# Patient Record
Sex: Male | Born: 1955 | Race: Black or African American | Hispanic: No | Marital: Married | Smoking: Never smoker
Health system: Southern US, Community
[De-identification: ages and names within clinical notes are randomized; demographics above are authoritative.]

## PROBLEM LIST (undated history)

## (undated) DIAGNOSIS — I739 Peripheral vascular disease, unspecified: Secondary | ICD-10-CM

## (undated) DIAGNOSIS — I219 Acute myocardial infarction, unspecified: Secondary | ICD-10-CM

## (undated) DIAGNOSIS — I639 Cerebral infarction, unspecified: Secondary | ICD-10-CM

## (undated) DIAGNOSIS — I10 Essential (primary) hypertension: Secondary | ICD-10-CM

## (undated) DIAGNOSIS — I4891 Unspecified atrial fibrillation: Secondary | ICD-10-CM

## (undated) DIAGNOSIS — I2510 Atherosclerotic heart disease of native coronary artery without angina pectoris: Secondary | ICD-10-CM

## (undated) DIAGNOSIS — I82409 Acute embolism and thrombosis of unspecified deep veins of unspecified lower extremity: Secondary | ICD-10-CM

## (undated) HISTORY — DX: Unspecified atrial fibrillation: I48.91

## (undated) HISTORY — DX: Peripheral vascular disease, unspecified: I73.9

## (undated) HISTORY — PX: CARDIAC DEFIBRILLATOR PLACEMENT: SHX171

## (undated) HISTORY — PX: PR VEIN BYPASS GRAFT,AORTO-FEM-POP: 35551

## (undated) HISTORY — DX: Acute embolism and thrombosis of unspecified deep veins of unspecified lower extremity: I82.409

## (undated) HISTORY — DX: Acute myocardial infarction, unspecified: I21.9

## (undated) HISTORY — DX: Essential (primary) hypertension: I10

## (undated) HISTORY — DX: Atherosclerotic heart disease of native coronary artery without angina pectoris: I25.10

## (undated) HISTORY — DX: Cerebral infarction, unspecified: I63.9

---

## 2002-11-08 ENCOUNTER — Ambulatory Visit (HOSPITAL_COMMUNITY): Admission: RE | Admit: 2002-11-08 | Discharge: 2002-11-08 | Payer: Self-pay | Admitting: Cardiovascular Disease

## 2003-10-24 HISTORY — PX: CORONARY ARTERY BYPASS GRAFT: SHX141

## 2014-05-03 ENCOUNTER — Ambulatory Visit: Payer: Self-pay | Admitting: Podiatry

## 2014-05-10 ENCOUNTER — Encounter: Payer: Self-pay | Admitting: Podiatry

## 2014-05-10 ENCOUNTER — Ambulatory Visit (INDEPENDENT_AMBULATORY_CARE_PROVIDER_SITE_OTHER): Payer: BC Managed Care – PPO | Admitting: Podiatry

## 2014-05-10 ENCOUNTER — Telehealth: Payer: Self-pay | Admitting: *Deleted

## 2014-05-10 ENCOUNTER — Ambulatory Visit: Payer: Self-pay | Admitting: Podiatry

## 2014-05-10 VITALS — BP 116/85 | HR 81 | Ht 74.0 in | Wt 266.0 lb

## 2014-05-10 DIAGNOSIS — B351 Tinea unguium: Secondary | ICD-10-CM | POA: Insufficient documentation

## 2014-05-10 DIAGNOSIS — M79609 Pain in unspecified limb: Secondary | ICD-10-CM

## 2014-05-10 DIAGNOSIS — M79605 Pain in left leg: Secondary | ICD-10-CM

## 2014-05-10 DIAGNOSIS — I739 Peripheral vascular disease, unspecified: Secondary | ICD-10-CM

## 2014-05-10 DIAGNOSIS — L600 Ingrowing nail: Secondary | ICD-10-CM | POA: Insufficient documentation

## 2014-05-10 DIAGNOSIS — M79675 Pain in left toe(s): Secondary | ICD-10-CM

## 2014-05-10 DIAGNOSIS — M86179 Other acute osteomyelitis, unspecified ankle and foot: Secondary | ICD-10-CM

## 2014-05-10 HISTORY — DX: Pain in left leg: M79.605

## 2014-05-10 HISTORY — DX: Tinea unguium: B35.1

## 2014-05-10 MED ORDER — CEPHALEXIN 500 MG PO CAPS: 500.0000 mg | ORAL_CAPSULE | Freq: Four times a day (QID) | ORAL | Status: DC

## 2014-05-10 MED ORDER — AMOXICILLIN-POT CLAVULANATE 500-125 MG PO TABS: 1.0000 | ORAL_TABLET | Freq: Three times a day (TID) | ORAL | Status: DC

## 2014-05-10 NOTE — Progress Notes (Signed)
Subjective: 58 year old male presents with problematic toe 2nd left. Stated that he has had injury on the 2nd toe left foot about a year ago and the toe continues to give problem since then, bleeding off and on.   Objective: Vascular: Pedal pulses are not palpable on both feet. Dermatologic: Thick dystrophic nails x 10. Bleeding loose nail 2nd and 3rd left. Positive inflammation on 2nd and 3rd digits with proximal expansion to the level of MPJ left foot. Mild forefoot edema with increased warmth left foot. Orthopedic: Severe contracted lesser digits bilateral. Neurologic: All epicritic and tactile sensations grossly intact. Upon removal of toe nails after numbing and cleansing with Iodine, noted of exposed distal phalangeal bone through torn tissue at the medial nail border which had been bleeding through nail base.  Radiographic examination done. Noted of loss of bone mass at distal end of the 2nd digit left.  Assessment: Dystrophic mycotic nails x 10. Injured loose nail with inflammation and bleeding on 2nd and 3rd left without abscess or sign of bacterial infection.  Osteomyelitis distal phalanx 2nd left. R/O PVD.  Plan: Reviewed findings and available treatment options. Left foot 2nd and 3rd digit anesthetized with 75ml of 50/50 mixture 0.5% Marcaine with epinephrine and 1% Xylocaine plain. Left bleeding toes cleansed with Iodine and nail plate removed. Reviewed the findings regard to bone infection. Home care instruction given to cleanse with Iodine, cover the area with clean dressing daily.  Amerigel dressing applied with instruction for dressing change.  All other nails also debrided. Referral to Vascular consultation initiated. Return in one week.

## 2014-05-10 NOTE — Patient Instructions (Signed)
Seen for painful toes 2nd and 3rd left. X-rays taken and noted of damaged bone at top of the 2nd digit.  Upon removal of nail plate, also noted of exposed bone at the tip of 2nd digit left. This findings suggest possible bone infection which may take long period of healing or may require removal of infected bone if fail to heal. Follow instruction, clean the wound with Iodine solution and cover with clean bandage. Return in one week.

## 2014-05-10 NOTE — Telephone Encounter (Signed)
Dr.Sheard, Pt called @ lunch and he states the RX sent in is expensive, the generic is 45.00. Is there anything else you can prescribe? I told him probably it could be a stronger dose because of infection in his foot, he still wanted me to ask you about a generic that was cheaper???

## 2014-05-13 ENCOUNTER — Other Ambulatory Visit: Payer: Self-pay | Admitting: *Deleted

## 2014-05-13 DIAGNOSIS — M79609 Pain in unspecified limb: Secondary | ICD-10-CM

## 2014-05-17 ENCOUNTER — Ambulatory Visit (INDEPENDENT_AMBULATORY_CARE_PROVIDER_SITE_OTHER): Payer: BC Managed Care – PPO | Admitting: Podiatry

## 2014-05-17 ENCOUNTER — Encounter: Payer: Self-pay | Admitting: Podiatry

## 2014-05-17 DIAGNOSIS — M86179 Other acute osteomyelitis, unspecified ankle and foot: Secondary | ICD-10-CM

## 2014-05-17 HISTORY — DX: Other acute osteomyelitis, unspecified ankle and foot: M86.179

## 2014-05-17 NOTE — Patient Instructions (Signed)
Seen for infected 2nd toe left foot.  Wound has necrotic base. Finish antibiotics. Cleanse wound with Betadine and apply Silvastat that was dispensed.  Return in 2 weeks.

## 2014-05-17 NOTE — Progress Notes (Signed)
Subjective: Status post infected nail avulsion with exposed phalangeal bone distal medial phalanx 2nd digit left.  Patient came in wearing surgical shoe and 2nd and 3rd toes covered with dressing.  Stated that he is scheduled to see a vascular doctor in June 24th.  Has had nail removal and placed in antibiotics last week.  Taking antibiotics as prescribed.   Objective: 3rd digit left is dry and all healed.  2nd digit left foot has raw nail base with necrotic tissue at distal medal top of nail bed area. Positive of generalized pedal edema left foot.   Assessment: Healed off 3rd digit left. Infected 2nd digit with necrotic tissue over exposed phalangeal bone.  Plan: Reviewed the findings. Area cleansed with Betadine and H2O2. Silvastat applied. Silvastat x 1 tube plus dressing change supply dispensed for daily wound care.  Patient is to wear open toed surgical shoes as much as possible.  Return in 2 weeks.

## 2014-05-31 ENCOUNTER — Ambulatory Visit (INDEPENDENT_AMBULATORY_CARE_PROVIDER_SITE_OTHER): Payer: BC Managed Care – PPO | Admitting: Podiatry

## 2014-05-31 ENCOUNTER — Encounter: Payer: Self-pay | Admitting: Podiatry

## 2014-05-31 VITALS — BP 117/82 | HR 85

## 2014-05-31 DIAGNOSIS — M79675 Pain in left toe(s): Secondary | ICD-10-CM

## 2014-05-31 DIAGNOSIS — M86179 Other acute osteomyelitis, unspecified ankle and foot: Secondary | ICD-10-CM

## 2014-05-31 DIAGNOSIS — M79605 Pain in left leg: Secondary | ICD-10-CM

## 2014-05-31 DIAGNOSIS — M79609 Pain in unspecified limb: Secondary | ICD-10-CM

## 2014-05-31 MED ORDER — CEPHALEXIN 500 MG PO CAPS: 500.0000 mg | ORAL_CAPSULE | Freq: Four times a day (QID) | ORAL | Status: DC

## 2014-05-31 NOTE — Progress Notes (Signed)
Subjective: Continue with home care, cleansing with peroxide and iodine, dressed with Silverstat cream daily. 3 weeks since infected nail was taken out.   Objective: Enlarged 2nd digit with small narrow opening from previous open ulcer at distal medial aspect. Minimum drainage noted.  No redness or edema associated with the wound.   Assessment: Improving ulcer 2nd toe left from impacted ingrown nail.    Plan: Reviewed findings and possible complication, loss of toe or foot in spite of antibiotic treatment and daily wound care. Keflex re-ordered. He still cannot afford to pay for Augmentin co-pay.  Continue with daily dressing change and stay in Surgical shoes.  Will watch a few more weeks for wound progress before jump into surgical removal of distal phalanx.  Return in 2 weeks.

## 2014-05-31 NOTE — Patient Instructions (Signed)
Follow up on left 2nd toe infection.  Seen less swelling and less edema, the wound is clean showing improvement. Antibiotic re-ordered. Return in 2 weeks.

## 2014-06-14 ENCOUNTER — Encounter: Payer: Self-pay | Admitting: Vascular Surgery

## 2014-06-15 ENCOUNTER — Other Ambulatory Visit: Payer: Self-pay | Admitting: Vascular Surgery

## 2014-06-15 ENCOUNTER — Ambulatory Visit (INDEPENDENT_AMBULATORY_CARE_PROVIDER_SITE_OTHER)
Admission: RE | Admit: 2014-06-15 | Discharge: 2014-06-15 | Disposition: A | Discharge status: Home / Self Care | Payer: BC Managed Care – PPO | Source: Ambulatory Visit | Attending: Vascular Surgery | Admitting: Vascular Surgery

## 2014-06-15 ENCOUNTER — Ambulatory Visit (HOSPITAL_COMMUNITY)
Admission: RE | Admit: 2014-06-15 | Discharge: 2014-06-15 | Disposition: A | Discharge status: Home / Self Care | Payer: BC Managed Care – PPO | Source: Ambulatory Visit | Attending: Vascular Surgery | Admitting: Vascular Surgery

## 2014-06-15 ENCOUNTER — Encounter: Payer: Self-pay | Admitting: Vascular Surgery

## 2014-06-15 ENCOUNTER — Ambulatory Visit: Payer: BC Managed Care – PPO | Admitting: Podiatry

## 2014-06-15 ENCOUNTER — Ambulatory Visit (INDEPENDENT_AMBULATORY_CARE_PROVIDER_SITE_OTHER): Payer: BC Managed Care – PPO | Admitting: Vascular Surgery

## 2014-06-15 VITALS — BP 117/81 | HR 65 | Ht 74.0 in | Wt 266.0 lb

## 2014-06-15 DIAGNOSIS — L98499 Non-pressure chronic ulcer of skin of other sites with unspecified severity: Principal | ICD-10-CM

## 2014-06-15 DIAGNOSIS — I739 Peripheral vascular disease, unspecified: Secondary | ICD-10-CM

## 2014-06-15 DIAGNOSIS — M79609 Pain in unspecified limb: Secondary | ICD-10-CM

## 2014-06-15 DIAGNOSIS — I7025 Atherosclerosis of native arteries of other extremities with ulceration: Secondary | ICD-10-CM

## 2014-06-15 HISTORY — DX: Atherosclerosis of native arteries of other extremities with ulceration: I70.25

## 2014-06-15 NOTE — Assessment & Plan Note (Signed)
This patient has a nonhealing wound on his left second toe. X-ray shows osteomyelitis. He will require a left second toe amputation. However based on his exam and Doppler studies he has evidence of significant multilevel arterial occlusive disease. Think without revascularization, given the toe pressure of 49 mmHg, there would be significant risk of a toe amputation nonhealing. Clearly this is a limb threatening problem. I have recommended that we proceed with arteriography. I have reviewed with the patient the indications for arteriography. In addition, I have reviewed the potential complications of arteriography including but not limited to: Bleeding, arterial injury, arterial thrombosis, dye action, renal insufficiency, or other unpredictable medical problems. I have explained to the patient that if we find disease amenable to angioplasty we could potentially address this at the same time. I have discussed the potential complications of angioplasty and stenting, including but not limited to: Bleeding, arterial thrombosis, arterial injury, dissection, or the need for surgical intervention. Currently, the patient would like to consider this further before scheduling the procedure. I've explained that without revascularization I think that the toe will not heal and that this could clearly become a limb threatening problem. Hopefully he will call as soon to schedule his arteriogram and possible angioplasty and stenting if he has disease amenable to percutaneous intervention

## 2014-06-15 NOTE — Progress Notes (Addendum)
Patient ID: Connor Mina., male   DOB: 1956-04-22, 58 y.o.   MRN: [id number]  Reason for Consult: Nonhealing wound left second toe   Referred by Dr. Raynald Kemp  Subjective:     HPI:  Connor Haagen. is a 58 y.o. male Who states that he has had a wound on his left second toe for a year now. However, only recently did the toe become significantly worse. I have reviewed his records from [facility] foot center. He had an injury to the left second toe about a year ago and it has given him problems ever . He was felt to have osteomyelitis of the distal phalanx of the left second toe. This was based on an x-ray performed there. He sent for vascular consultation.  The patient denies any significant claudication or history of rest pain.  His risk factors for vascular disease include hypertension and a family history of premature cardiovascular disease. He denies any history of diabetes, hypercholesterolemia, or history of tobacco use.  His mother had heart disease in her 74s. He's also had a previous myocardial infarction in 2003 and has undergone CABG with vein taken from the right leg. He is on Xarelto for atrial fibrillation  Past Medical History  Diagnosis Date  . Atrial fibrillation   . Stroke   . CAD (coronary artery disease)   . DVT (deep venous thrombosis)   . Myocardial infarction   . Peripheral vascular disease   . Hypertension    Family History  Problem Relation Age of Onset  . Heart disease Mother     before age 31  . Hyperlipidemia Mother   . Hypertension Mother   . Heart attack Mother   . Hyperlipidemia Father   . Hypertension Father    Past Surgical History  Procedure Laterality Date  . Pr vein bypass graft,aorto-fem-pop    . Coronary artery bypass graft  10/2003  . Cardiac defibrillator placement     Short Social History:  History  Substance Use Topics  . Smoking status: Never Smoker   . Smokeless tobacco: Never Used  . Alcohol Use: No   Allergies  Allergen  Reactions  . Plavix [Clopidogrel Bisulfate]    Current Outpatient Prescriptions  Medication Sig Dispense Refill  . aspirin 81 MG tablet Take 81 mg by mouth daily.      . cephALEXin (KEFLEX) 500 MG capsule Take 1 capsule (500 mg total) by mouth 4 (four) times daily.  60 capsule  0  . lisinopril (PRINIVIL,ZESTRIL) 10 MG tablet Take 10 mg by mouth daily.      . rivaroxaban (XARELTO) 10 MG TABS tablet Take 10 mg by mouth daily.      . sotalol (BETAPACE) 80 MG tablet Take 80 mg by mouth daily.       No current facility-administered medications for this visit.   Review of Systems  Constitutional: Negative for chills and fever.  Eyes: Negative for loss of vision.  Respiratory: Negative for cough and wheezing.  Cardiovascular: Positive for leg swelling. Negative for chest pain, chest tightness, claudication, dyspnea with exertion, orthopnea and palpitations.  GI: Negative for blood in stool and vomiting.  GU: Negative for dysuria and hematuria.  Musculoskeletal: Negative for leg pain, joint pain and myalgias.  Skin: Negative for rash and wound.  Neurological: Positive for numbness. Negative for dizziness and speech difficulty.  Hematologic: Negative for bruises/bleeds easily. Psychiatric: Negative for depressed mood.        Objective:  Objective  Filed Vitals:  06/15/14 1440  BP: 117/81  Pulse: 65  Height: 6\' 2"  (1.88 m)  Weight: 266 lb (120.657 kg)  SpO2: 100%   Body mass index is 34.14 kg/(m^2).  Physical Exam  Constitutional: He is oriented to person, place, and time. He appears well-developed and well-nourished.  HENT:  Head: Normocephalic and atraumatic.  Neck: Neck supple. No JVD present. No thyromegaly present.  Cardiovascular: Normal rate, regular rhythm and normal heart sounds.  Exam reveals no friction rub.   No murmur heard. Pulses:      Femoral pulses are 2+ on the right side, and 1+ on the left side. Pulmonary/Chest: Breath sounds normal. He has no wheezes. He  has no rales.  Abdominal: Soft. Bowel sounds are normal. There is no tenderness.  I do not palpate an aneurysm.  Musculoskeletal: Normal range of motion. He exhibits no edema.  Lymphadenopathy:    He has no cervical adenopathy.  Neurological: He is alert and oriented to person, place, and time. He has normal strength. No sensory deficit.  Skin: No lesion and no rash noted.  He has an open wound on the end of his left second toe with a small amount of drainage. The left second toe is swollen. There is mild erythema.  Psychiatric: He has a normal mood and affect.   Data: I have independently interpreted his duplex scan which shows that he has common femoral artery disease bilaterally and also bilateral superficial femoral artery occlusions. He has monophasic Doppler signals in the dorsalis pedis and posterior tibial positions bilaterally. ABI on the right is 80%. ABI on the left is 85%. Toe pressure on the right is 55 mmHg. Toe pressure on the left is 49 mmHg.      Assessment/Plan:    Atherosclerosis of native arteries of the extremities with ulceration(440.23) This patient has a nonhealing wound on his left second toe. X-ray shows osteomyelitis. He will require a left second toe amputation. However based on his exam and Doppler studies he has evidence of significant multilevel arterial occlusive disease. Think without revascularization, given the toe pressure of 49 mmHg, there would be significant risk of a toe amputation nonhealing. Clearly this is a limb threatening problem. I have recommended that we proceed with arteriography. I have reviewed with the patient the indications for arteriography. In addition, I have reviewed the potential complications of arteriography including but not limited to: Bleeding, arterial injury, arterial thrombosis, dye action, renal insufficiency, or other unpredictable medical problems. I have explained to the patient that if we find disease amenable to angioplasty we  could potentially address this at the same time. I have discussed the potential complications of angioplasty and stenting, including but not limited to: Bleeding, arterial thrombosis, arterial injury, dissection, or the need for surgical intervention. Currently, the patient would like to consider this further before scheduling the procedure. I've explained that without revascularization I think that the toe will not heal and that this could clearly become a limb threatening problem. Hopefully he will call as soon to schedule his arteriogram and possible angioplasty and stenting if he has disease amenable to percutaneous intervention   Of note, if he does agree to proceed with arteriography we will need to stop his Xarelto for 48 hours prior to his procedure.  Chuck Hinthristopher S Dickson MD [facility]

## 2014-06-27 ENCOUNTER — Encounter: Payer: Self-pay | Admitting: Podiatry

## 2014-06-27 ENCOUNTER — Ambulatory Visit (INDEPENDENT_AMBULATORY_CARE_PROVIDER_SITE_OTHER): Payer: BC Managed Care – PPO | Admitting: Podiatry

## 2014-06-27 VITALS — BP 138/77 | HR 65 | Ht 74.0 in | Wt 266.0 lb

## 2014-06-27 DIAGNOSIS — L98499 Non-pressure chronic ulcer of skin of other sites with unspecified severity: Secondary | ICD-10-CM

## 2014-06-27 DIAGNOSIS — M86179 Other acute osteomyelitis, unspecified ankle and foot: Secondary | ICD-10-CM

## 2014-06-27 DIAGNOSIS — I739 Peripheral vascular disease, unspecified: Secondary | ICD-10-CM

## 2014-06-27 NOTE — Patient Instructions (Addendum)
Seen for 2nd toe lesion left foot.  Reviewed Vascular study findings regard to need for further test.  May continue daily dressing change. Use open toed shoes till the wound is healing.

## 2014-06-27 NOTE — Progress Notes (Signed)
Subjective: Follow up on 2nd toe wound left foot.  Wound healing in progress. Blood sugar is under control. Vascular consultation report reviewed with the patient.  Patient elected to pursue recommendation given by his Cardiologist who told him that walking is the best thing for him.  Patient believes that he has good enough blood flow based on the nurse's comment who done the test.   Objective: Open wound 2nd digit left. Still draining through bandage and to socks. Pedal pulses are not palpable.   Assessment: Possible Osteomyelitis 2nd toe left. Open wound, healing still in progress.   Plan: Continue with current level of care. Use open toed shoes.

## 2014-07-20 ENCOUNTER — Ambulatory Visit: Payer: BC Managed Care – PPO | Admitting: Podiatry

## 2014-07-29 ENCOUNTER — Ambulatory Visit: Payer: BC Managed Care – PPO | Admitting: Podiatry

## 2014-08-12 ENCOUNTER — Encounter: Payer: Self-pay | Admitting: Podiatry

## 2014-08-12 ENCOUNTER — Ambulatory Visit (INDEPENDENT_AMBULATORY_CARE_PROVIDER_SITE_OTHER): Payer: BC Managed Care – PPO | Admitting: Podiatry

## 2014-08-12 VITALS — BP 125/76 | HR 67 | Ht 74.0 in | Wt 266.0 lb

## 2014-08-12 DIAGNOSIS — M86179 Other acute osteomyelitis, unspecified ankle and foot: Secondary | ICD-10-CM

## 2014-08-12 DIAGNOSIS — M79609 Pain in unspecified limb: Secondary | ICD-10-CM

## 2014-08-12 DIAGNOSIS — M79675 Pain in left toe(s): Secondary | ICD-10-CM

## 2014-08-12 DIAGNOSIS — M79605 Pain in left leg: Secondary | ICD-10-CM

## 2014-08-12 NOTE — Progress Notes (Signed)
Subjective:  Follow up on 2nd toe wound left foot. Stated that he was seen by his primary care physician and his foot was examined.  Patient brought office note from his PCP. Noted indicate that his Uric acid was up and suspect osteomyelitis on the 2nd digit left.   Objective: Open wound 2nd digit left show steady improvement. No active drainage. Very small spot of unhealed tissue.   Pedal pulses are still not palpable.   Assessment: Possible Osteomyelitis 2nd toe left.  Open wound, healing still in progress.   Plan: Patient was explained of the possible deep bone infection.  During previous visits, patient requested conservative treatment and refused to follow through recommendation from the last vascular specialist.  Continue with current level of care.  Use open toed shoes.

## 2014-08-12 NOTE — Patient Instructions (Signed)
Follow up on left 2nd toe. Wound show improvement. Decreased opening and no drainage noted. Need to continue with current level of care on the 2nd toe. Noted of fungal infection in between 1st, 2nd, 3rd toes. May use Tinactin foot powder after scrub, complete dry each shower.  Return in one month.

## 2014-09-12 ENCOUNTER — Ambulatory Visit: Payer: BC Managed Care – PPO | Admitting: Podiatry

## 2014-09-13 ENCOUNTER — Ambulatory Visit: Payer: BC Managed Care – PPO | Admitting: Podiatry

## 2014-09-23 ENCOUNTER — Ambulatory Visit: Payer: BC Managed Care – PPO | Admitting: Podiatry

## 2014-10-06 ENCOUNTER — Encounter: Payer: Self-pay | Admitting: *Deleted

## 2014-10-06 ENCOUNTER — Ambulatory Visit (INDEPENDENT_AMBULATORY_CARE_PROVIDER_SITE_OTHER): Payer: BC Managed Care – PPO

## 2014-10-06 VITALS — BP 103/72 | HR 64 | Resp 12

## 2014-10-06 DIAGNOSIS — I739 Peripheral vascular disease, unspecified: Secondary | ICD-10-CM | POA: Insufficient documentation

## 2014-10-06 DIAGNOSIS — L97524 Non-pressure chronic ulcer of other part of left foot with necrosis of bone: Secondary | ICD-10-CM

## 2014-10-06 DIAGNOSIS — M86172 Other acute osteomyelitis, left ankle and foot: Secondary | ICD-10-CM

## 2014-10-06 DIAGNOSIS — L97509 Non-pressure chronic ulcer of other part of unspecified foot with unspecified severity: Secondary | ICD-10-CM | POA: Insufficient documentation

## 2014-10-06 DIAGNOSIS — R52 Pain, unspecified: Secondary | ICD-10-CM

## 2014-10-06 DIAGNOSIS — G629 Polyneuropathy, unspecified: Secondary | ICD-10-CM

## 2014-10-06 HISTORY — DX: Non-pressure chronic ulcer of other part of unspecified foot with unspecified severity: L97.509

## 2014-10-06 MED ORDER — CLINDAMYCIN HCL 300 MG PO CAPS: 300.0000 mg | ORAL_CAPSULE | Freq: Three times a day (TID) | ORAL | Status: DC

## 2014-10-06 MED ORDER — SILVER SULFADIAZINE 1 % EX CREA: 1.0000 "application " | TOPICAL_CREAM | Freq: Every day | CUTANEOUS | Status: DC

## 2014-10-06 NOTE — Progress Notes (Signed)
Subjective:    Patient ID: Connor Burns., male    DOB: 03-07-1956, 58 y.o.   MRN: [id number]  HPI PT STATED PULLED A PIECE OF BONE TOP OF THE LT FOOT 2ND TOE TODAY BUT STILL SWOLLEN, BLEEDING, AND SORE. THE TOE IS LOOKING WORSE AND GET AGGRAVATED BY PRESSURE. TRIED NO TREATMENT   Review of Systems  Constitutional: Negative.   HENT: Negative.   Respiratory: Negative.   Cardiovascular: Negative for leg swelling.  Gastrointestinal: Negative.   Endocrine: Negative.   Genitourinary: Negative.   Skin: Positive for color change and wound.  Neurological: Positive for numbness.  Hematological: Negative.   Psychiatric/Behavioral: Negative.   All other systems reviewed and are negative.      Objective:   Physical Exam  Constitutional: He is oriented to person, place, and time. He appears well-developed and well-nourished.  Cardiovascular: Normal rate and regular rhythm.  Exam reveals decreased pulses.   Pulses:      Popliteal pulses are 0 on the right side, and 0 on the left side.       Dorsalis pedis pulses are 0 on the right side, and 0 on the left side.       Posterior tibial pulses are 0 on the right side, and 0 on the left side.  Bilateral absent pedal pulses diminished skin texture and turgor complete absence of hair growth thinning and shiny skin noted. Ulcer distal tuft second digit left foot nonhealing for several months now  Pulmonary/Chest: Effort normal.  Musculoskeletal: Normal range of motion.  Orthopedic and musculoskeletal exam lower extremity reveals rectus foot type mild semirigid digital contractures noted a long second toes there is some dry blood discharge drainage no malodor and maceration distal tuft of the second digit left foot with an open ulcer does probe down to palliative bone however patient has a small baggy what appears to be bone that came from the end of his toe. X-rays confirm complete lysis of the distal phalanx and possibly some erosion of the end of  the middle phalanx beginning to occur. Soft tissue swelling and edema of the second digit consistent with a chronic osteomyelitis of the second toe. Left  Neurological: He is alert and oriented to person, place, and time. He has normal reflexes.  Epicritic sensations are diminished to both forefoot ankle and plantar foot areas bilateral on Semmes Weinstein testing absence in all areas except the anterior ankle area. Dorsum of the digits plantar digits and plantar forefoot he'll have absent sensation. Vibratory sensation is intact bilateral muscle strengths normal and symmetric  Skin: Skin is dry.  Again there is ulceration distal tuft second digit proximal the half centimeter in diameter does probe down to the bone capsule area there is absence of the distal phalanx confirmed on x-ray remaining skin thin and shiny with absence of hair growth diminished turgor noted nail somewhat criptotic incurvated friable as well.  Psychiatric: He has a normal mood and affect. His speech is normal and behavior is normal.   did review the patient has had vascular studies done at home apparently had diminished studies that recommended stenting her arteriogram however patient declined followup and previously been seen by Dr. Ouida Sills. Palliative nail evulsion and some wound or ulcer care in the past as well. Has been and ice the past not recently on antibiotic. There is some drainage and slight malodor maceration of the second toe also some interdigital fissuring and maceration may be noted with edema of the second  digit and some likely fluctuance due to the chronic osteomyelitis and infection in the toe.       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 Patient however seems reluctant to follow through with  vascular rather than going back to come for follow up with VVS. Patient indicates he would like to follow up with his cardiologist here after of Dr. Toma CopierMumley. I suggested that Dr. Vella RedheadMotley will likely also refer him for vascular evaluation likely to his [place] office affiliate .  At this time the ulcer site is debrided and cleansed with all cleansed Silvadene and gauze dressing are applied prescription for Silvadene is given also prescription for clindamycin is issued as there is edema and erythema the toe was localized cellulitis and malodor. Patient will take antibiotic and is devised daily cleansing and dressing change also maintain any accommodative shoe wear no slip on shoe which may be loosened patient by to make sure toes are not giving on the end of the shoe.  My strong recommendation is that patient needs an amputation of that second toe and also a revascularization to help in the healing process as well as prevention of further breakdown adjacent digits. Recommendation would be for revascularization first to allow the toe and the indication suture better chance of healing. Patient however indicates he will not be frightened into any procedure or treatment. I again tried to explain to him that this toe and will likely will not heal without adequate blood flow just like his heart with not continued being if you do not had bypass surgery. Patient seemed to understand this however I stressed that he needs to follow through with his cardiovascular position here in [place] reappointed in one to 2 weeks for followup and assessment of the ulcer and possible consultation for amputation. However my recommendation is vascular evaluation prior to amputation. Maintain antibiotics as prescribed and daily dressing changes as recommended next  Alvan Dameichard Bonney Berres DPM

## 2014-10-06 NOTE — Patient Instructions (Signed)
ANTIBACTERIAL SOAP INSTRUCTIONS  THE DAY AFTER PROCEDURE  Please follow the instructions your doctor has marked.   Shower as usual. Before getting out, place a drop of antibacterial liquid soap (Dial) on a wet, clean washcloth.  Gently wipe washcloth over affected area.  Afterward, rinse the area with warm water.  Blot the area dry with a soft cloth and cover with antibiotic ointment (neosporin, polysporin, bacitracin) and band aid or gauze and tape  Place 3-4 drops of antibacterial liquid soap in a quart of warm tap water.  Submerge foot into water for 20 minutes.  If bandage was applied after your procedure, leave on to allow for easy lift off, then remove and continue with soak for the remaining time.  Next, blot area dry with a soft cloth and cover with a bandage.  Apply other medications as directed by your doctor, such as cortisporin otic solution (eardrops) or neosporin antibiotic ointment  Cleanse the foot and toe daily with antibacterial soap and warm water apply Silvadene antibiotic ointment and a Band-Aid or gauze dressing daily  Followup with cardiologist or cardiovascular for lower extremity arterial Doppler studies and possible stenting

## 2014-10-18 ENCOUNTER — Telehealth: Payer: Self-pay | Admitting: *Deleted

## 2014-10-18 NOTE — Telephone Encounter (Signed)
10/18/14 Dr. Raynald Kemp, [name] called me on the way into office this am. He states he came by the office last Thursday, He did speak to Maralyn Sago and said a bone or something had broken off his toe and it was bleeding, Maralyn Sago told him to go to urgent care of the emergency room because he couldn't be seen until the next day. Pt has seen another Doctor and he told him his toe needs to be taken off but before then he needs to see a vascular Doctor. I told him to call me back in about 20 minutes so I could talk to him further, as of 9:10 this am he hasn't called back. I think he was wanting the name of the Vascular Doctor for an appointment.

## 2014-10-20 ENCOUNTER — Telehealth: Payer: Self-pay | Admitting: *Deleted

## 2014-10-20 ENCOUNTER — Ambulatory Visit: Payer: BC Managed Care – PPO

## 2014-10-20 NOTE — Telephone Encounter (Signed)
PT CALLED STATING HE HAD AN APPOINTMENT AT 800 AM WAS HERE AT 815 AM AND DOOR WAS LOCKED AND NO ONE WAS HERE (WHICH WAS NOT TRUE OFFICE WAS OPEN) AND HE NOW ON HIS WAY TO WORK AND NEEDS TO CALL BACK TO RESCHEDULE AFTER HE HAS SOME OTHER APPOINTMENTS.   PHONE TREE REPORT STATES PATIENT RECEIVED REMINDER CALL AT 836 AM ON 10/19/2014 AT PHONE # [phone] FOR APPOINTMENT AT 830 AM IN [place] OFFICE WHICH HE CONFIRMED

## 2014-10-21 ENCOUNTER — Other Ambulatory Visit: Payer: Self-pay

## 2014-10-30 MED ORDER — SODIUM CHLORIDE 0.9 % IV SOLN: INTRAVENOUS | Status: DC

## 2014-10-31 ENCOUNTER — Telehealth: Payer: Self-pay | Admitting: Vascular Surgery

## 2014-10-31 ENCOUNTER — Ambulatory Visit (HOSPITAL_COMMUNITY)
Admission: RE | Admit: 2014-10-31 | Payer: BC Managed Care – PPO | Source: Ambulatory Visit | Admitting: Vascular Surgery

## 2014-10-31 ENCOUNTER — Encounter (HOSPITAL_COMMUNITY): Admission: RE | Payer: Self-pay | Source: Ambulatory Visit

## 2014-10-31 SURGERY — ABDOMINAL AORTAGRAM
Anesthesia: LOCAL

## 2014-10-31 NOTE — Telephone Encounter (Addendum)
-----   Message from Chuck Hint, MD sent at 10/31/2014  8:06 AM EST ----- Regarding: no show This pt did not show up for his angio at 7:30. I have not seen him since June. Maybe he needs to be seen in the office before rescheduling? Thanks CD   10/31/14: I fwd this msg to [place] with appt date, as she will need to speak with him to notify of CSDs plan since his No Show.

## 2014-11-08 ENCOUNTER — Encounter: Payer: Self-pay | Admitting: Vascular Surgery

## 2014-11-09 ENCOUNTER — Ambulatory Visit: Payer: BC Managed Care – PPO | Admitting: Vascular Surgery

## 2014-12-06 ENCOUNTER — Encounter: Payer: Self-pay | Admitting: Vascular Surgery

## 2014-12-07 ENCOUNTER — Ambulatory Visit: Payer: BC Managed Care – PPO | Admitting: Vascular Surgery

## 2014-12-27 ENCOUNTER — Encounter: Payer: Self-pay | Admitting: Vascular Surgery

## 2014-12-28 ENCOUNTER — Ambulatory Visit: Payer: BC Managed Care – PPO | Admitting: Vascular Surgery

## 2015-01-24 ENCOUNTER — Encounter: Payer: Self-pay | Admitting: Vascular Surgery

## 2015-01-25 ENCOUNTER — Ambulatory Visit: Payer: BC Managed Care – PPO | Admitting: Vascular Surgery

## 2015-05-19 ENCOUNTER — Telehealth: Payer: Self-pay | Admitting: *Deleted

## 2015-05-19 NOTE — Telephone Encounter (Signed)
FOLLOW UP CALL-  I left message for patient to call back to reschedule his appt to see Dr. Edilia Bo in the office. He has either no showed or cancelled x3 appts to further discuss and schedule an aortogram with Dr. Edilia Bo. He was originally scheduled to have an outpatient aortogram with runoff and possible interventions on 10-31-2014 but no showed to that procedure also. We have tried numerous times to contact patient since.

## 2015-08-08 DIAGNOSIS — I480 Paroxysmal atrial fibrillation: Secondary | ICD-10-CM

## 2015-08-08 DIAGNOSIS — I483 Typical atrial flutter: Secondary | ICD-10-CM | POA: Insufficient documentation

## 2015-08-08 DIAGNOSIS — I4891 Unspecified atrial fibrillation: Secondary | ICD-10-CM | POA: Insufficient documentation

## 2015-08-08 HISTORY — DX: Typical atrial flutter: I48.3

## 2015-08-08 HISTORY — DX: Paroxysmal atrial fibrillation: I48.0

## 2015-08-10 DIAGNOSIS — R319 Hematuria, unspecified: Secondary | ICD-10-CM | POA: Insufficient documentation

## 2015-08-10 DIAGNOSIS — E669 Obesity, unspecified: Secondary | ICD-10-CM | POA: Insufficient documentation

## 2015-08-10 HISTORY — DX: Obesity, unspecified: E66.9

## 2015-08-10 HISTORY — DX: Hematuria, unspecified: R31.9

## 2016-02-13 ENCOUNTER — Other Ambulatory Visit: Payer: Self-pay | Admitting: *Deleted

## 2016-02-13 DIAGNOSIS — R200 Anesthesia of skin: Secondary | ICD-10-CM

## 2016-02-27 ENCOUNTER — Encounter: Payer: BC Managed Care – PPO | Admitting: Neurology

## 2016-03-26 ENCOUNTER — Ambulatory Visit (INDEPENDENT_AMBULATORY_CARE_PROVIDER_SITE_OTHER): Payer: BC Managed Care – PPO | Admitting: Neurology

## 2016-03-26 ENCOUNTER — Encounter: Payer: Self-pay | Admitting: Neurology

## 2016-03-26 DIAGNOSIS — R200 Anesthesia of skin: Secondary | ICD-10-CM

## 2016-03-26 DIAGNOSIS — R208 Other disturbances of skin sensation: Secondary | ICD-10-CM

## 2016-03-26 DIAGNOSIS — G629 Polyneuropathy, unspecified: Secondary | ICD-10-CM

## 2016-03-26 NOTE — Procedures (Signed)
[facility] Neurology  [address] [place], [address]  [place], [place] [address] Tel: [phone] Fax:  [phone] Test Date:  03/26/2016  Patient: Connor Burns DOB: [birth year] Physician: Nita Sickle, DO  Sex: Male Height: 6\' 4"  Ref Phys: Dr Veverly Fells  ID#: [id number] Temp: 32.9C Technician: Judie Petit. Dean   Patient Complaints: This is a 60 year old gentleman referred for evaluation of left leg paresthesias and weakness.  NCV & EMG Findings: Extensive electrodiagnostic testing of the left lower extremity and additional studies of the right shows: 1. Bilateral sural and superficial peroneal sensory responses are absent. 2. Bilateral peroneal motor responses are within normal limits. Right lateral tibial motor responses show reduced amplitude. 3. Bilateral tibial H reflex studies are prolonged. 4. Chronic motor axon loss changes are seen affecting the muscles below the knee bilaterally. Active denervation is present in the medial gastrocnemius muscles bilaterally.  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  Consider neurological consultation for further evaluation.   ___________________________ Nita Sickle, DO    Nerve Conduction Studies Anti Sensory Summary Table   Site NR Peak (ms) Norm Peak (ms) P-T Amp (V) Norm P-T Amp  Left Sup Peroneal Anti Sensory (Ant Lat Mall)  32.9C  12 cm NR  <4.6  >4  Right Sup Peroneal Anti Sensory (Ant Lat Mall)  32.9C  12 cm NR  <4.6  >4  Left Sural Anti Sensory (Lat Mall)  32.9C  Calf NR  <4.6  >4  Right Sural Anti Sensory (Lat Mall)  32.9C  Calf NR  <4.6  >4   Motor Summary Table   Site NR Onset (ms) Norm Onset (ms) O-P Amp (mV) Norm O-P Amp Site1 Site2 Delta-0 (ms) Dist (cm) Vel (m/s) Norm Vel (m/s)  Left Peroneal Motor (Ext Dig Brev)  32.9C  Ankle    3.3 <6.0 2.7 >2.5 B Fib Ankle 8.0 38.0 48 >40  B Fib     11.3  2.1  Poplt B Fib 0.0 10.0  >40  Poplt    11.3  2.2         Right Peroneal Motor (Ext Dig Brev)  32.9C  Ankle    3.8 <6.0 3.3 >2.5 B Fib Ankle 9.1 38.0 42 >40  B Fib    12.9  2.4  Poplt B Fib 2.3 10.0 43 >40  Poplt    15.2  2.1         Left Peroneal TA Motor (Tib Ant)  32.9C  Fib Head    4.5 <4.5 4.0 >3 Poplit Fib Head 1.8 10.0 56 >40  Poplit    6.3  4.0         Left Tibial Motor (Abd Hall Brev)  32.9C  Ankle    3.8 <6.0 1.0 >4 Knee Ankle 11.7 47.0 40 >40  Knee    15.5  0.7         Right Tibial Motor (Abd Hall Brev)  32.9C  Ankle    3.6 <6.0 1.8 >4 Knee Ankle 10.5 45.0 43 >40  Knee    14.1  1.2          H Reflex Studies   NR H-Lat (ms) Lat Norm (ms) L-R H-Lat (ms)  Left Tibial (Gastroc)  32.9C     36.73 <35 1.63  Right Tibial (Gastroc)  32.9C     35.10 <35 1.63   EMG   Side Muscle Ins Act Fibs Psw Fasc Number Recrt  Dur Dur. Amp Amp. Poly Poly. Comment  Left RectFemoris Nml Nml Nml Nml Nml Nml Nml Nml Nml Nml Nml Nml N/A  Left AntTibialis Nml Nml Nml Nml 1- Rapid Some 1+ Some 1+ Nml Nml N/A  Left GluteusMed Nml Nml Nml Nml Nml Nml Nml Nml Nml Nml Nml Nml N/A  Left BicepsFemS Nml Nml Nml Nml Nml Nml Nml Nml Nml Nml Nml Nml N/A  Left Lumbo Parasp Low Nml Nml Nml Nml Nml Nml Nml Nml Nml Nml Nml Nml N/A  Left GluteusMax Nml Nml Nml Nml Nml Nml Nml Nml Nml Nml Nml Nml N/A  Left Flex Dig Long Nml Nml Nml Nml 2- Rapid Some 1+ Some 1+ Nml Nml N/A  Left Gastroc Nml 1+ Nml Nml 2- Mod-R Some 1+ Few Nml Nml Nml N/A  Right AntTibialis Nml Nml Nml Nml 1- Rapid Some 1+ Some 1+ Nml Nml N/A  Right Gastroc Nml 1+ Nml Nml 2- Mod-R Some 1+ Few Nml Nml Nml N/A      Waveforms:

## 2016-04-22 ENCOUNTER — Ambulatory Visit: Payer: BC Managed Care – PPO | Admitting: Neurology

## 2016-06-11 ENCOUNTER — Encounter: Payer: Self-pay | Admitting: Internal Medicine

## 2016-06-18 DIAGNOSIS — I255 Ischemic cardiomyopathy: Secondary | ICD-10-CM | POA: Insufficient documentation

## 2016-06-18 HISTORY — DX: Ischemic cardiomyopathy: I25.5

## 2016-06-24 ENCOUNTER — Encounter: Payer: Self-pay | Admitting: *Deleted

## 2016-06-24 ENCOUNTER — Ambulatory Visit (INDEPENDENT_AMBULATORY_CARE_PROVIDER_SITE_OTHER): Payer: BC Managed Care – PPO | Admitting: Internal Medicine

## 2016-06-24 ENCOUNTER — Encounter: Payer: Self-pay | Admitting: Internal Medicine

## 2016-06-24 VITALS — BP 110/66 | HR 75 | Ht 74.0 in | Wt 261.4 lb

## 2016-06-24 DIAGNOSIS — I25709 Atherosclerosis of coronary artery bypass graft(s), unspecified, with unspecified angina pectoris: Secondary | ICD-10-CM

## 2016-06-24 DIAGNOSIS — R079 Chest pain, unspecified: Secondary | ICD-10-CM

## 2016-06-24 MED ORDER — RIVAROXABAN 20 MG PO TABS: 20.0000 mg | ORAL_TABLET | Freq: Every day | ORAL | Status: DC

## 2016-06-24 MED ORDER — ROSUVASTATIN CALCIUM 5 MG PO TABS: 5.0000 mg | ORAL_TABLET | ORAL | Status: DC

## 2016-06-24 NOTE — Progress Notes (Signed)
Cardiology Office Note   Date:  06/24/2016   ID:  Connor Mina., DOB October 29, 1956, MRN [id number]  PCP:  Darrow Bussing, MD  Cardiologist:   Dietrich Pates, MD   Pt referred by Dr Docia Chuck for continued care of CAD    History of Present Illness: Connor Sylve. is a 60 y.o. male with a history of CAD (s/p CABG 2004 in [place] ; S/P ICD implant (Dr Chales Abrahams, [facility])   Also hsitory of atrial fib  S/p ablation Chales Abrahams)  Last had ICD chedk in June  Had salvos of afib reprorted   He has been off of Xarelto for 8 months   Hx also of PAD, neuropathy.   He presents for continued care as Dr Chales Abrahams is no longer practicing    Past few days feeling discomfort in L elbow area  Random  With and without activity  Breathing ok  At gym walks on treadmill Cycles  Wts    Gets occasional pain in L chest  Without activity  Hasnt had with activity  At work starts  Has to move arm  NO real stress  Chest discomfort started in 2 wks ago    Can last 10 to 15 min  Occuring every other day   No dizziness  No syncope  No palpitations       Outpatient Prescriptions Prior to Visit  Medication Sig Dispense Refill  . aspirin 81 MG tablet Take 81 mg by mouth daily.    Marland Kitchen lisinopril (PRINIVIL,ZESTRIL) 10 MG tablet Take 10 mg by mouth daily.    . sotalol (BETAPACE) 80 MG tablet Take 80 mg by mouth daily.    . cephALEXin (KEFLEX) 500 MG capsule Take 1 capsule (500 mg total) by mouth 4 (four) times daily. (Patient not taking: Reported on 06/24/2016) 60 capsule 0  . clindamycin (CLEOCIN) 300 MG capsule Take 1 capsule (300 mg total) by mouth 3 (three) times daily. (Patient not taking: Reported on 06/24/2016) 30 capsule 1  . rivaroxaban (XARELTO) 10 MG TABS tablet Take 10 mg by mouth daily.    . silver sulfADIAZINE (SILVADENE) 1 % cream Apply 1 application topically daily. (Patient not taking: Reported on 06/24/2016) 50 g 0   No facility-administered medications prior to visit.     Allergies:   Clopidogrel and Plavix   Past  Medical History  Diagnosis Date  . Atrial fibrillation (HCC)   . Stroke (HCC)   . CAD (coronary artery disease)   . DVT (deep venous thrombosis) (HCC)   . Myocardial infarction (HCC)   . Peripheral vascular disease (HCC)   . Hypertension     Past Surgical History  Procedure Laterality Date  . Pr vein bypass graft,aorto-fem-pop    . Coronary artery bypass graft  10/2003  . Cardiac defibrillator placement       Social History:  The patient  reports that he has never smoked. He has never used smokeless tobacco. He reports that he does not drink alcohol or use illicit drugs.   Family History:  The patient's family history includes Heart attack in his mother; Heart disease in his mother; Hyperlipidemia in his father and mother; Hypertension in his father and mother.    ROS:  Please see the history of present illness. All other systems are reviewed and  Negative to the above problem except as noted.    PHYSICAL EXAM: VS:  BP 110/66 mmHg  Pulse 75  Ht  (1.88 m)  Wt 261 lb 6.4  oz (118.57 kg)  BMI 33.55 kg/m2  GEN: Well nourished, well developed, in no acute distress HEENT: normal Neck: no JVD, carotid bruits, or masses Cardiac: RRR; no murmurs, rubs, or gallops,no edema  Respiratory:  clear to auscultation bilaterally, normal work of breathing GI: soft, nontender, nondistended, + BS  No hepatomegaly  MS: no deformity Moving all extremities   Skin: warm and dry, no rash Neuro:  Strength and sensation are intact Psych: euthymic mood, full affect   EKG:  EKG is ordered today.  Atrial paced with occasional PAC  75 bpm  IWMI   T wav inversion in inferior and lateral leads     Lipid Panel No results found for: CHOL, TRIG, HDL, CHOLHDL, VLDL, LDLCALC, LDLDIRECT    Wt Readings from Last 3 Encounters:  06/24/16 261 lb 6.4 oz (118.57 kg)  08/12/14 266 lb (120.657 kg)  06/27/14 266 lb (120.657 kg)      ASSESSMENT AND PLAN:  1  CAD  Pt with remote CABG  Has not had a  myoview scan in a couple years  He is concerned about symptoms though they sound atypical  New  I would recomm a lexiscan myovue to r/o ischemia    2.  Afib  S/P ablation  ON Sotalol  Per report of [facility] he has had some afib  CHADSVASc score is 4  He should be back on Xarelto  I would stop ASA   Labs OK  3.  VT?  Needs to review records   He needs to be set up for f/u here   4  HL  LDL neds to be lower On recent checl LDL was 118  I would recomm Crestor 5 mg every other day  He is concerned about myalgias which he may have had on Lipitor  Check lipdis in 8 wks        Signed, Dietrich Pates, MD  06/24/2016 5:01 PM    [facility] [address] [place], [place], [place]  [address] Phone: [phone]; Fax: [phone]

## 2016-06-24 NOTE — Patient Instructions (Signed)
Your physician has recommended you make the following change in your medication:  1. Stop aspirin 2. Start Xarelto 20 mg once daily 3. Start Crestor 5 mg every other day  Your physician has requested that you have a lexiscan myoview. For further information please visit [email or web address] Please follow instruction sheet, as given.  Your physician recommends that you return for lab work in: 2 months (lipids)  We will contact you regarding follow up with device clinic.

## 2016-07-10 NOTE — Progress Notes (Signed)
Staff message sent to scheduling requesting EP appointment.

## 2016-07-22 ENCOUNTER — Other Ambulatory Visit (INDEPENDENT_AMBULATORY_CARE_PROVIDER_SITE_OTHER): Payer: BC Managed Care – PPO

## 2016-07-22 ENCOUNTER — Encounter: Payer: Self-pay | Admitting: Neurology

## 2016-07-22 ENCOUNTER — Ambulatory Visit (INDEPENDENT_AMBULATORY_CARE_PROVIDER_SITE_OTHER): Payer: BC Managed Care – PPO | Admitting: Neurology

## 2016-07-22 VITALS — BP 124/88 | HR 82 | Ht 74.0 in | Wt 266.0 lb

## 2016-07-22 DIAGNOSIS — G609 Hereditary and idiopathic neuropathy, unspecified: Secondary | ICD-10-CM

## 2016-07-22 DIAGNOSIS — R252 Cramp and spasm: Secondary | ICD-10-CM | POA: Diagnosis not present

## 2016-07-22 DIAGNOSIS — R4589 Other symptoms and signs involving emotional state: Secondary | ICD-10-CM

## 2016-07-22 DIAGNOSIS — F329 Major depressive disorder, single episode, unspecified: Secondary | ICD-10-CM

## 2016-07-22 DIAGNOSIS — G629 Polyneuropathy, unspecified: Secondary | ICD-10-CM

## 2016-07-22 LAB — C-REACTIVE PROTEIN: CRP: 0.4 mg/dL — AB (ref 0.5–20.0)

## 2016-07-22 LAB — VITAMIN B12: VITAMIN B 12: 256 pg/mL (ref 211–911)

## 2016-07-22 LAB — MAGNESIUM: Magnesium: 2 mg/dL (ref 1.5–2.5)

## 2016-07-22 LAB — CK: Total CK: 118 U/L (ref 7–232)

## 2016-07-22 LAB — SEDIMENTATION RATE: Sed Rate: 8 mm/hr (ref 0–20)

## 2016-07-22 NOTE — Progress Notes (Signed)
[facility] Clinic Note - Initial Visit   Date: 07/22/16  [name]. MRN: [id number] DOB: 1956-03-25   Dear Dr. Dorthy Cooler:  Thank you for your kind referral of Treysen Sudbeck. for consultation of muscle cramps and numbness. Although his history is well known to you, please allow [place] to reiterate it for the purpose of our medical record. The patient was accompanied to the clinic by self.    History of Present Illness: Krishang Reading. is a 60 y.o. 60 y.o. right-handed African American male with atrial fibrillation on Xeralto, CAD, hypertension, hyperlipidemia, TIA, peripheral vascular disease presenting for evaluation of leg cramps and weakness.    Over the past few years, he has had cramps intermittently but over the past few months, they have become more frequent.  He does not have severe pain with the cramps, and has noticed that repositioning it helps.    He has long history of numbness involving the lower legs and feet.  He is unable to sense tactile stimuli.  His endorses imbalance and reports when walking on a treadmill he always has to hold on the hand rails.  He walks independently and has not had any falls.  NCS/EMG performed in April showed subacute sensorimotor polyneuropathy affecting the legs.  He does not have numbness/tingling of the hands.  He has been told that his sugars are borderline and has not been diagnosed with diabetes.  No history of alcohol use or family history of neuropathy.  He is mostly bothered by the fact that he always feel rundown and tired.  He is aware that he needs to loose weight.  He has been going to the gym, but that started eating a lot of sweets.  His mood has been down and sometimes he feels sad, especially regarding his daughter who is developmentally-delayed.  He lives at home with his wife and 21 year old daughter.   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  TSH - normal   Past Medical History:  Diagnosis Date  . Atrial fibrillation ([place])   . CAD (coronary artery disease)   . DVT (deep venous thrombosis) ([place])   . Hypertension   . Myocardial infarction ([place])   . Peripheral vascular disease ([place])   . Stroke [facility])     Past Surgical History:  Procedure Laterality Date  . CARDIAC DEFIBRILLATOR PLACEMENT    . CORONARY ARTERY BYPASS GRAFT  10/2003  . PR VEIN BYPASS GRAFT,AORTO-FEM-POP       Medications:  Outpatient Encounter Prescriptions as of 07/22/2016  Medication Sig Note  . lisinopril (PRINIVIL,ZESTRIL) 10 MG tablet Take 10 mg by mouth daily.   . rivaroxaban (XARELTO) 20 MG TABS tablet Take 1 tablet (20 mg total) by mouth daily with supper.   . rosuvastatin (CRESTOR) 5 MG tablet Take 1 tablet (5 mg total) by mouth every other day.   . sotalol (BETAPACE) 120 MG tablet  07/22/2016: Received from: External Pharmacy   No facility-administered encounter medications on file as of 07/22/2016.      Allergies:  Allergies  Allergen Reactions  . Clopidogrel Anaphylaxis and Other (See Comments)    "Wierd, like out of body thing" Out of body experience   . Plavix [Clopidogrel Bisulfate]     Family History: Family History  Problem Relation Age of Onset  . Hyperlipidemia Father   . Hypertension Father   . Heart disease Mother     before age 24  . Hyperlipidemia Mother   . Hypertension Mother   . Heart attack Mother     Social History: Social History  Substance Use Topics  . Smoking status: Never Smoker  . Smokeless tobacco: Never Used  . Alcohol use No   Social History   Social History Narrative   Lives with wife in a 2 story home.  Has 3 children.     Works as a Research scientist (medical).      Education: Masters in [employer].    Review of Systems:  CONSTITUTIONAL: No fevers, chills, night sweats, or weight loss.   EYES: No visual changes or eye pain ENT: No hearing changes.  No history of nose bleeds.   RESPIRATORY: No cough, wheezing and shortness of breath.   CARDIOVASCULAR: Negative for chest pain, and palpitations.   GI: Negative for abdominal discomfort, blood in stools or black stools.  No recent change in bowel habits.   GU:  No history of incontinence.   MUSCLOSKELETAL: No history of joint pain or swelling.  No myalgias.   SKIN: Negative for lesions, rash, and itching.   HEMATOLOGY/ONCOLOGY: Negative for prolonged bleeding, bruising easily, and swollen nodes.  No history of cancer.   ENDOCRINE: Negative for cold or heat intolerance, polydipsia or goiter.   PSYCH:  +depression or anxiety symptoms.   NEURO: As Above.   Vital Signs:  BP 124/88   Pulse 82   Ht 6' 2"  (1.88 m)   SpO2 97%   BMI 34.15 kg/m    General Medical Exam:   General:  Well appearing, comfortable.   Eyes/ENT: see cranial nerve examination.   Neck: No masses appreciated.  Full range of motion without tenderness.  No carotid bruits. Respiratory:  Clear to auscultation, good air entry bilaterally.   Cardiac:  Regular rate and rhythm, no murmur.   Extremities:  No deformities, edema, or skin discoloration.  Skin:  No rashes or lesions.  Neurological Exam: MENTAL STATUS including orientation to time, place, person, recent and remote memory, attention span and concentration, language, and fund of knowledge is normal.  Speech is not dysarthric.  CRANIAL NERVES: II:  No visual field defects.  Unremarkable fundi.   III-IV-VI: Pupils equal round and reactive to light.  Normal conjugate, extra-ocular eye movements in all directions of gaze.  No nystagmus.  No ptosis.   V:  Normal facial sensation.    VII:  Normal facial symmetry and movements.  No pathologic facial reflexes.  VIII:  Normal hearing  and vestibular function.   IX-X:  Normal palatal movement.   XI:  Normal shoulder shrug and head rotation.   XII:  Normal tongue strength and range of motion, no deviation or fasciculation.  MOTOR:  Motor strength is 5/5 throughout.  No atrophy, fasciculations or abnormal movements.  No pronator drift.  Tone is normal.    MSRs:  Right                                                                 Left brachioradialis 2+  brachioradialis 2+  biceps 2+  biceps 2+  triceps 2+  triceps 2+  patellar 2+  patellar 2+  ankle jerk 0  ankle jerk 0  Hoffman no  Hoffman no  plantar response down  plantar response down   SENSORY:  Vibration is reduced to 50% at the ankles bilaterally and absent at the great toe.  Temperature, pin prick, and light touch is reduced in the feet.  COORDINATION/GAIT: Normal finger-to- nose-finger.  Intact rapid alternating movements bilaterally.  Gait is wide-based due to body habitus, stable and unassisted.  He is very unsteady with tandem gait   IMPRESSION: 1.  Bilateral feet numbness due to sensorimotor polyneuropathy (active changes in the medial gastrocnemius muscles).  His neurological examination shows a distal predominant large fiber peripheral neuropathy. I had extensive discussion with the patient regarding the pathogenesis, etiology, management, and natural course of neuropathy. Neuropathy tends to be slowly progressive, especially if a treatable etiology is not identified.  I would like to test for treatable causes of neuropathy. I discussed that in the vast majority of cases, despite checking for reversible causes, we are unable to find the underlying etiology and management is symptomatic.  Check 2hr glucose tolerance test, ESR, CRP, vitamin B12, MMA, copper, SPEP with IFE   2.  Muscle cramps.  Possibly due to statin medications, but these overall are not too frequent.  Recommend that he continue crestor as he is taking it and start flexeril 55m at bedtime as  needed for cramps. Encouraged him to stay well hydrated.  Check CK, magnesium   3. Depressed mood which is likely contributing to his overall feeling of malaise.  He is agreeable to seeing a counselor so will refer him to behavior therapy  Return to clinic in 4 months.   The duration of this appointment visit was 40 minutes of face-to-face time with the patient.  Greater than 50% of this time was spent in counseling, explanation of diagnosis, planning of further management, and coordination of care.   Thank you for allowing me to participate in patient's care.  If I can answer any additional questions, I would be pleased to do so.    Sincerely,    Llewelyn Sheaffer K. PPosey Pronto DO

## 2016-07-22 NOTE — Progress Notes (Signed)
See previous encounter

## 2016-07-22 NOTE — Patient Instructions (Signed)
Start flexeril 5mg  at bedtime for muscle cramps Check blood work Referral to Behavior Health   Return to clinic in 4 months

## 2016-07-24 ENCOUNTER — Telehealth (HOSPITAL_COMMUNITY): Payer: Self-pay | Admitting: *Deleted

## 2016-07-24 LAB — PROTEIN ELECTROPHORESIS, SERUM
ALBUMIN ELP: 3.8 g/dL (ref 3.8–4.8)
ALPHA-1-GLOBULIN: 0.3 g/dL (ref 0.2–0.3)
Alpha-2-Globulin: 0.5 g/dL (ref 0.5–0.9)
BETA GLOBULIN: 0.4 g/dL (ref 0.4–0.6)
Beta 2: 0.5 g/dL (ref 0.2–0.5)
Gamma Globulin: 1.1 g/dL (ref 0.8–1.7)
Total Protein, Serum Electrophoresis: 6.6 g/dL (ref 6.1–8.1)

## 2016-07-24 LAB — IMMUNOFIXATION ELECTROPHORESIS
IGG (IMMUNOGLOBIN G), SERUM: 1221 mg/dL (ref 694–1618)
IGM, SERUM: 27 mg/dL — AB (ref 48–271)
IgA: 453 mg/dL (ref 81–463)

## 2016-07-24 LAB — COPPER, SERUM: Copper: 118 ug/dL (ref 72–166)

## 2016-07-24 LAB — METHYLMALONIC ACID, SERUM: Methylmalonic Acid, Quant: 173 nmol/L (ref 87–318)

## 2016-07-24 NOTE — Telephone Encounter (Signed)
Left message on voicemail per DPR in reference to upcoming appointment scheduled on 07/29/16 with detailed instructions given per Myocardial Perfusion Study Information Sheet for the test. LM to arrive 15 minutes early, and that it is imperative to arrive on time for appointment to keep from having the test rescheduled. If you need to cancel or reschedule your appointment, please call the office within 24 hours of your appointment. Failure to do so may result in a cancellation of your appointment, and a $50 no show fee. Phone number given for call back for any questions. Diquan Kassis J Trinidi Toppins, RN  

## 2016-07-29 ENCOUNTER — Encounter (INDEPENDENT_AMBULATORY_CARE_PROVIDER_SITE_OTHER): Payer: Self-pay

## 2016-07-29 ENCOUNTER — Ambulatory Visit (HOSPITAL_COMMUNITY): Payer: BC Managed Care – PPO

## 2016-07-29 ENCOUNTER — Other Ambulatory Visit: Payer: Self-pay | Admitting: *Deleted

## 2016-07-29 ENCOUNTER — Encounter: Payer: BC Managed Care – PPO | Admitting: Cardiology

## 2016-07-29 ENCOUNTER — Encounter (HOSPITAL_COMMUNITY): Payer: Self-pay

## 2016-07-29 ENCOUNTER — Encounter (HOSPITAL_COMMUNITY): Payer: Self-pay | Admitting: *Deleted

## 2016-07-29 MED ORDER — CYCLOBENZAPRINE HCL 5 MG PO TABS: 5.0000 mg | ORAL_TABLET | Freq: Every day | ORAL | 1 refills | Status: DC

## 2016-07-29 NOTE — Progress Notes (Deleted)
Electrophysiology Office Note   Date:  07/29/2016   ID:  Connor Burns., DOB 1956/01/31, MRN [id number]  PCP:  Darrow Bussing, MD  Cardiologist:  Tenny Craw Primary Electrophysiologist:  Christyana Corwin Jorja Loa, MD    No chief complaint on file.    History of Present Illness: Connor Burns. is a 60 y.o. male who presents today for electrophysiology evaluation.   Hx CAD (s/p CABG 2004 in [place] ; S/P ICD implant (Dr Chales Abrahams, [facility])   Also hsitory of atrial fib  S/p ablation Chales Abrahams).    Today, he denies*** symptoms of palpitations, chest pain, shortness of breath, orthopnea, PND, lower extremity edema, claudication, dizziness, presyncope, syncope, bleeding, or neurologic sequela. The patient is tolerating medications without difficulties and is otherwise without complaint today.    Past Medical History:  Diagnosis Date  . Atrial fibrillation (HCC)   . CAD (coronary artery disease)   . DVT (deep venous thrombosis) (HCC)   . Hypertension   . Myocardial infarction (HCC)   . Peripheral vascular disease (HCC)   . Stroke [facility])    Past Surgical History:  Procedure Laterality Date  . CARDIAC DEFIBRILLATOR PLACEMENT    . CORONARY ARTERY BYPASS GRAFT  10/2003  . PR VEIN BYPASS GRAFT,AORTO-FEM-POP       Current Outpatient Prescriptions  Medication Sig Dispense Refill  . lisinopril (PRINIVIL,ZESTRIL) 10 MG tablet Take 10 mg by mouth daily.    . rivaroxaban (XARELTO) 20 MG TABS tablet Take 1 tablet (20 mg total) by mouth daily with supper. 30 tablet 11  . rosuvastatin (CRESTOR) 5 MG tablet Take 1 tablet (5 mg total) by mouth every other day. 45 tablet 3  . sotalol (BETAPACE) 120 MG tablet      No current facility-administered medications for this visit.     Allergies:   Clopidogrel and Plavix [clopidogrel bisulfate]   Social History:  The patient  reports that he has never smoked. He has never used smokeless tobacco. He reports that he does not drink alcohol or use drugs.   Family  History:  The patient's family history includes Heart attack in his mother; Heart disease in his mother; Hyperlipidemia in his father and mother; Hypertension in his father and mother.    ROS:  Please see the history of present illness.   Otherwise, review of systems is positive for ***.   All other systems are reviewed and negative.    PHYSICAL EXAM: VS:  There were no vitals taken for this visit. , BMI There is no height or weight on file to calculate BMI. GEN: Well nourished, well developed, in no acute distress  HEENT: normal  Neck: no JVD, carotid bruits, or masses Cardiac: ***RRR; no murmurs, rubs, or gallops,no edema  Respiratory:  clear to auscultation bilaterally, normal work of breathing GI: soft, nontender, nondistended, + BS MS: no deformity or atrophy  Skin: warm and dry, *** device pocket is well healed Neuro:  Strength and sensation are intact Psych: euthymic mood, full affect  EKG:  EKG {ACTION; IS/IS YTK:16010932} ordered today. Personal review of the ekg ordered shows ***  *** Device interrogation is reviewed today in detail.  See PaceArt for details.   Recent Labs: 07/22/2016: Magnesium 2.0    Lipid Panel  No results found for: CHOL, TRIG, HDL, CHOLHDL, VLDL, LDLCALC, LDLDIRECT   Wt Readings from Last 3 Encounters:  07/22/16 266 lb (120.7 kg)  07/22/16 266 lb (120.7 kg)  06/24/16 261 lb 6.4 oz (118.6 kg)  Other studies Reviewed: Additional studies/ records that were reviewed today include: ***  Review of the above records today demonstrates: ***   ASSESSMENT AND PLAN:  1  CAD  Pt with remote CABG.  2.  Atrial fibrillation:  S/P ablation, on Sotalol, Xarelto.   This patients CHA2DS2-VASc Score and unadjusted Ischemic Stroke Rate (% per year) is equal to 4.8 % stroke rate/year from a score of 4  Above score calculated as 1 point each if present [CHF, HTN, DM, Vascular=MI/PAD/Aortic Plaque, Age if 65-74, or Male] Above score calculated as  2 points each if present [Age > 75, or Stroke/TIA/TE]   3.  VT?  Needs to review records   He needs to be set up for f/u here   4 hyperlipidemia:  On every other day crestor 5 mg.   Current medicines are reviewed at length with the patient today.   The patient {ACTIONS; HAS/DOES NOT HAVE:19233} concerns regarding his medicines.  The following changes were made today:  {NONE DEFAULTED:18576::"none"}  Labs/ tests ordered today include: *** No orders of the defined types were placed in this encounter.    Disposition:   FU with *** {gen number 1-30:865784} {Days to years:10300}  Signed, Adamariz Gillott Jorja Loa, MD  07/29/2016 7:48 AM     [facility] [address] [place] [address] [phone] (office) [phone] (fax)

## 2016-09-02 ENCOUNTER — Telehealth (HOSPITAL_COMMUNITY): Payer: Self-pay | Admitting: Internal Medicine

## 2016-09-02 NOTE — Telephone Encounter (Signed)
New Message:  Called pt and spoke with to schedule his myocardial perfusion that was order for him on 06/24/16.  His request was to only take the Treadmill test, he did not want to do the test with the medicine. Can the original order be changed? I look forward to hearing from you.   Thanks

## 2016-09-02 NOTE — Telephone Encounter (Signed)
Called patient.  He states based on what he was told when he was called to review instructions he would prefer a regular tread mill test.     While I was reviewing how the stress test would go with him, he asked if he could call back because he was at an appointment and had to hang up.  He said he would call back later.

## 2016-11-22 ENCOUNTER — Ambulatory Visit: Payer: BC Managed Care – PPO | Admitting: Neurology

## 2016-12-02 ENCOUNTER — Other Ambulatory Visit: Payer: Self-pay | Admitting: Neurology

## 2016-12-12 ENCOUNTER — Ambulatory Visit: Payer: BC Managed Care – PPO | Admitting: Neurology

## 2017-01-14 DIAGNOSIS — I420 Dilated cardiomyopathy: Secondary | ICD-10-CM | POA: Insufficient documentation

## 2017-01-14 DIAGNOSIS — Z91199 Patient's noncompliance with other medical treatment and regimen due to unspecified reason: Secondary | ICD-10-CM | POA: Insufficient documentation

## 2017-01-14 DIAGNOSIS — I509 Heart failure, unspecified: Secondary | ICD-10-CM | POA: Insufficient documentation

## 2017-01-14 DIAGNOSIS — I2510 Atherosclerotic heart disease of native coronary artery without angina pectoris: Secondary | ICD-10-CM

## 2017-01-14 DIAGNOSIS — I472 Ventricular tachycardia, unspecified: Secondary | ICD-10-CM | POA: Insufficient documentation

## 2017-01-14 DIAGNOSIS — Z9119 Patient's noncompliance with other medical treatment and regimen: Secondary | ICD-10-CM | POA: Insufficient documentation

## 2017-01-14 DIAGNOSIS — Z95810 Presence of automatic (implantable) cardiac defibrillator: Secondary | ICD-10-CM | POA: Insufficient documentation

## 2017-01-14 DIAGNOSIS — R55 Syncope and collapse: Secondary | ICD-10-CM

## 2017-01-14 DIAGNOSIS — I5043 Acute on chronic combined systolic (congestive) and diastolic (congestive) heart failure: Principal | ICD-10-CM | POA: Diagnosis present

## 2017-01-14 HISTORY — DX: Syncope and collapse: R55

## 2017-01-14 HISTORY — DX: Heart failure, unspecified: I50.9

## 2017-01-14 HISTORY — DX: Ventricular tachycardia, unspecified: I47.20

## 2017-01-14 HISTORY — DX: Presence of automatic (implantable) cardiac defibrillator: Z95.810

## 2017-01-14 HISTORY — DX: Atherosclerotic heart disease of native coronary artery without angina pectoris: I25.10

## 2017-01-14 HISTORY — DX: Dilated cardiomyopathy: I42.0

## 2017-02-09 ENCOUNTER — Other Ambulatory Visit: Payer: Self-pay | Admitting: Neurology

## 2017-02-28 ENCOUNTER — Ambulatory Visit: Payer: BC Managed Care – PPO | Admitting: Neurology

## 2017-04-25 ENCOUNTER — Ambulatory Visit: Payer: BC Managed Care – PPO | Admitting: Neurology

## 2017-05-01 ENCOUNTER — Telehealth: Payer: Self-pay | Admitting: Internal Medicine

## 2017-05-01 NOTE — Telephone Encounter (Signed)
Follow Up:   Please call,concerning medication change.

## 2017-05-02 ENCOUNTER — Telehealth: Payer: Self-pay | Admitting: Internal Medicine

## 2017-05-02 NOTE — Telephone Encounter (Signed)
New Message  Pt c/o medication issue:  1. Name of Medication: Sotalol  2. How are you currently taking this medication (dosage and times per day)? 80mg    3. Are you having a reaction (difficulty breathing--STAT)? No   4. What is your medication issue? Erica from [facility] call requesting to speak with RN about pt not taking thins medication .she states pt has stop this med and is now having swelling to legs and some SOB. Alcario Drought would like to know if pt needs to start back taking the medication or if it would be okay to keep him off. Please call back to discuss

## 2017-05-02 NOTE — Telephone Encounter (Signed)
Left messag for Connor Burns at [facility] to call back.

## 2017-05-02 NOTE — Telephone Encounter (Signed)
Left message for Alcario Drought at Dr. Glendale Chard office to call back.

## 2017-05-02 NOTE — Telephone Encounter (Signed)
Triage RN LMTCB on 05/01/17.

## 2017-05-05 NOTE — Telephone Encounter (Signed)
Left message for [place] at [facility] to call back.

## 2017-05-06 ENCOUNTER — Encounter: Payer: Self-pay | Admitting: Internal Medicine

## 2017-05-06 NOTE — Telephone Encounter (Signed)
Erica from Dr. Glendale Chard office states patient reports has not taken sotalol for 6 months.   He was seen by PCP for fatigue leg swelling, sob, and per provider notes he was NSR, no afib.  A chest xray was ordered.  Dr. Docia Chuck wanted to find out if they should tell patient to restart Sotalol.     Advised will contact patient for follow up in cardiology.  Called patient and scheduled with Dr. Tenny Craw for 5/16. He states he is feeling "ok" as long as he does not exert himself very much.

## 2017-05-07 ENCOUNTER — Ambulatory Visit: Payer: BC Managed Care – PPO | Admitting: Internal Medicine

## 2017-05-07 NOTE — Progress Notes (Deleted)
   Cardiology Office Note   Date:  05/07/2017   ID:  Connor Burns., DOB 26-Aug-1956, MRN [id number]  PCP:  Darrow Bussing, MD  Cardiologist:   Dietrich Pates, MD       History of Present Illness: Connor Burns. is a 61 y.o. male with a history of CAD (s/p CABG 2004 in [place]) ;  VT with syncope  On sotalol S/P ICD implant (Dr Connor Burns, [place])   Also hsitory of atrial fib  S/p ablation Connor Burns)    Had salvos of afib reprorted   Hx also of PAD, neuropathy.      I saw him in July of last year  He was seen by Cardiology at [facility] in Jan 2018  Echo and stress test done   Interroagtion of ICD showed atrial fib intermitt, short NSVT       No outpatient prescriptions have been marked as taking for the 05/07/17 encounter (Appointment) with [name], MD.     Allergies:   Clopidogrel and Plavix [clopidogrel bisulfate]   Past Medical History:  Diagnosis Date  . Atrial fibrillation (HCC)   . CAD (coronary artery disease)   . DVT (deep venous thrombosis) (HCC)   . Hypertension   . Myocardial infarction (HCC)   . Peripheral vascular disease (HCC)   . Stroke [facility])     Past Surgical History:  Procedure Laterality Date  . CARDIAC DEFIBRILLATOR PLACEMENT    . CORONARY ARTERY BYPASS GRAFT  10/2003  . PR VEIN BYPASS GRAFT,AORTO-FEM-POP       Social History:  The patient  reports that he has never smoked. He has never used smokeless tobacco. He reports that he does not drink alcohol or use drugs.   Family History:  The patient's family history includes Heart attack in his mother; Heart disease in his mother; Hyperlipidemia in his father and mother; Hypertension in his father and mother.    ROS:  Please see the history of present illness. All other systems are reviewed and  Negative to the above problem except as noted.    PHYSICAL EXAM: VS:  There were no vitals taken for this visit.  GEN: Well nourished, well developed, in no acute distress  HEENT:  normal  Neck: no JVD, carotid bruits, or masses Cardiac: RRR; no murmurs, rubs, or gallops,no edema  Respiratory:  clear to auscultation bilaterally, normal work of breathing GI: soft, nontender, nondistended, + BS  No hepatomegaly  MS: no deformity Moving all extremities   Skin: warm and dry, no rash Neuro:  Strength and sensation are intact Psych: euthymic mood, full affect   EKG:  EKG is ordered today.   Lipid Panel No results found for: CHOL, TRIG, HDL, CHOLHDL, VLDL, LDLCALC, LDLDIRECT    Wt Readings from Last 3 Encounters:  07/22/16 266 lb (120.7 kg)  07/22/16 266 lb (120.7 kg)  06/24/16 261 lb 6.4 oz (118.6 kg)      ASSESSMENT AND PLAN:     Current medicines are reviewed at length with the patient today.  The patient does not have concerns regarding medicines.  Signed, Dietrich Pates, MD  05/07/2017 9:21 AM    [facility] [address] [place], [place], [place]  [address] Phone: [phone]; Fax: [phone]

## 2017-05-09 ENCOUNTER — Ambulatory Visit
Admission: RE | Admit: 2017-05-09 | Discharge: 2017-05-09 | Disposition: A | Discharge status: Home / Self Care | Payer: BC Managed Care – PPO | Source: Ambulatory Visit | Attending: Family Medicine | Admitting: Family Medicine

## 2017-05-09 ENCOUNTER — Other Ambulatory Visit: Payer: Self-pay | Admitting: Family Medicine

## 2017-05-09 DIAGNOSIS — R0602 Shortness of breath: Secondary | ICD-10-CM

## 2017-05-15 ENCOUNTER — Encounter: Payer: Self-pay | Admitting: Internal Medicine

## 2017-05-28 ENCOUNTER — Other Ambulatory Visit: Payer: Self-pay | Admitting: Family Medicine

## 2017-05-28 DIAGNOSIS — I739 Peripheral vascular disease, unspecified: Secondary | ICD-10-CM

## 2017-06-26 ENCOUNTER — Ambulatory Visit: Payer: BC Managed Care – PPO | Admitting: Podiatry

## 2017-07-01 ENCOUNTER — Ambulatory Visit: Payer: BC Managed Care – PPO | Admitting: Neurology

## 2017-07-04 ENCOUNTER — Other Ambulatory Visit: Payer: Self-pay | Admitting: Internal Medicine

## 2017-07-04 DIAGNOSIS — I25709 Atherosclerosis of coronary artery bypass graft(s), unspecified, with unspecified angina pectoris: Secondary | ICD-10-CM

## 2017-07-04 DIAGNOSIS — R079 Chest pain, unspecified: Secondary | ICD-10-CM

## 2017-07-07 ENCOUNTER — Other Ambulatory Visit: Payer: Self-pay | Admitting: *Deleted

## 2017-07-07 MED ORDER — RIVAROXABAN 20 MG PO TABS: 20.0000 mg | ORAL_TABLET | Freq: Every day | ORAL | 6 refills | Status: DC

## 2017-07-07 NOTE — Telephone Encounter (Signed)
Pt last saw Dr Tenny Craw on 06/24/16, has upcoming appt scheduled for 08/11/17.  Last labs in chart are from 01/30/16 Creat 1.34, called pt's primary care office Dr Docia Chuck requested copy of most recent labwork done in June 2018.  Will await those results.

## 2017-07-07 NOTE — Telephone Encounter (Signed)
Labs received from Dr Glendale Chard office, Creat 1.47 on 05/26/17, age 61, weight 120.7kg, CrCl 90.09, based on CrCl pt is on appropriate dosage of Xarelto 20mg  QD.  Will refill rx.

## 2017-07-14 ENCOUNTER — Ambulatory Visit: Payer: BC Managed Care – PPO | Admitting: Podiatry

## 2017-08-04 ENCOUNTER — Encounter: Payer: Self-pay | Admitting: Podiatry

## 2017-08-04 ENCOUNTER — Ambulatory Visit: Payer: BC Managed Care – PPO | Admitting: Podiatry

## 2017-08-11 ENCOUNTER — Encounter: Payer: Self-pay | Admitting: Internal Medicine

## 2017-08-11 ENCOUNTER — Encounter: Payer: Self-pay | Admitting: *Deleted

## 2017-08-11 ENCOUNTER — Ambulatory Visit (INDEPENDENT_AMBULATORY_CARE_PROVIDER_SITE_OTHER): Payer: BC Managed Care – PPO | Admitting: Internal Medicine

## 2017-08-11 ENCOUNTER — Encounter (INDEPENDENT_AMBULATORY_CARE_PROVIDER_SITE_OTHER): Payer: Self-pay

## 2017-08-11 VITALS — BP 124/72 | HR 78 | Ht 74.0 in | Wt 258.2 lb

## 2017-08-11 DIAGNOSIS — E782 Mixed hyperlipidemia: Secondary | ICD-10-CM | POA: Diagnosis not present

## 2017-08-11 DIAGNOSIS — I480 Paroxysmal atrial fibrillation: Secondary | ICD-10-CM | POA: Diagnosis not present

## 2017-08-11 DIAGNOSIS — I472 Ventricular tachycardia, unspecified: Secondary | ICD-10-CM

## 2017-08-11 DIAGNOSIS — M79661 Pain in right lower leg: Secondary | ICD-10-CM | POA: Diagnosis not present

## 2017-08-11 DIAGNOSIS — I2510 Atherosclerotic heart disease of native coronary artery without angina pectoris: Secondary | ICD-10-CM

## 2017-08-11 DIAGNOSIS — M79662 Pain in left lower leg: Secondary | ICD-10-CM | POA: Diagnosis not present

## 2017-08-11 NOTE — Patient Instructions (Signed)
Your physician recommends that you continue on your current medications as directed. Please refer to the Current Medication list given to you today.  Your physician recommends that you return for lab work today (tsh, bmet, cbc, lipids)  Your physician has requested that you have an echocardiogram. Echocardiography is a painless test that uses sound waves to create images of your heart. It provides your doctor with information about the size and shape of your heart and how well your heart's chambers and valves are working. This procedure takes approximately one hour. There are no restrictions for this procedure.  Your physician has requested that you have a lower extremity arterial duplex. This test is an ultrasound of the arteries in the legs. It looks at arterial blood flow in the legs. Allow one hour for Lower Arterial scans. There are no restrictions or special instructions  Your physician wants you to follow-up in: 6 months with Dr. Tenny Craw.  You will receive a reminder letter in the mail two months in advance. If you don't receive a letter, please call our office to schedule the follow-up appointment.

## 2017-08-11 NOTE — Progress Notes (Signed)
Cardiology Office Note   Date:  08/11/2017   ID:  Connor Mina., DOB [birth year], MRN [id number]  PCP:  Darrow Bussing, MD  Cardiologist:   Dietrich Pates, MD   Pt presents for f/u of CAD      History of Present Illness: Connor Pavlicek. is a 61 y.o. male with a history of CAD (s/p CABG 2004 in [place] ; S/P ICD implant (Dr Chales Abrahams, [place])   Also hsitory of atrial fib  S/p ablation Chales Abrahams)  Last had ICD chedk in June 2017  showdsalvos of afib reprorted    Hx also of PAD, neuropathy.    I saw the pt in July 2017  Recomm a lexiscan myovue at that time I aslso recomm stopping ASA and adding back Xarelto  The pt did not return for f/u  Seen by Dr Zena Amos in [place], last in May 2018  Myovue done   EKG was negative for ischemia.   Perfusion scan showed large infarct  No ischemia  LVEF mod depressed    Pt says for the last couple weeks he has been winded  Tired  Cant understand  Goes to gym 4days per wk   Pt als notes pain in legs/toes  Wonders if blood supply a problem  Pain with standing    Current Meds  Medication Sig  . cyclobenzaprine (FLEXERIL) 5 MG tablet TAKE ONE TABLET BY MOUTH AT BEDTIME  . lisinopril (PRINIVIL,ZESTRIL) 10 MG tablet Take 10 mg by mouth daily.  . rivaroxaban (XARELTO) 20 MG TABS tablet Take 1 tablet (20 mg total) by mouth daily with supper.  . rosuvastatin (CRESTOR) 5 MG tablet Take 1 tablet (5 mg total) by mouth every other day.  . sotalol (BETAPACE) 80 MG tablet      Allergies:   Clopidogrel and Plavix [clopidogrel bisulfate]   Past Medical History:  Diagnosis Date  . Atrial fibrillation (HCC)   . CAD (coronary artery disease)   . DVT (deep venous thrombosis) (HCC)   . Hypertension   . Myocardial infarction (HCC)   . Peripheral vascular disease (HCC)   . Stroke [facility])     Past Surgical History:  Procedure Laterality Date  . CARDIAC DEFIBRILLATOR PLACEMENT    . CORONARY ARTERY BYPASS GRAFT  10/2003  . PR VEIN BYPASS GRAFT,AORTO-FEM-POP        Social History:  The patient  reports that he has never smoked. He has never used smokeless tobacco. He reports that he does not drink alcohol or use drugs.   Family History:  The patient's family history includes Heart attack in his mother; Heart disease in his mother; Hyperlipidemia in his father and mother; Hypertension in his father and mother.    ROS:  Please see the history of present illness. All other systems are reviewed and  Negative to the above problem except as noted.    PHYSICAL EXAM: VS:  BP 124/72   Pulse 78   Ht 6\' 2"  (1.88 m)   Wt 258 lb 3.2 oz (117.1 kg)   SpO2 98%   BMI 33.15 kg/m   GEN: Well nourished, well developed, in no acute distress  HEENT: normal  Neck: no JVD, carotid bruits, or masses Cardiac: RRR; no murmurs, rubs, or gallops,no edema  1+ DP  Respiratory:  clear to auscultation bilaterally, normal work of breathing GI: soft, nontender, nondistended, + BS  No hepatomegaly  MS: no deformity Moving all extremities   Skin: warm and dry, no rash Neuro:  Strength and sensation are intact Psych: euthymic mood, full affect   EKG:  EKG is not ordered today.   Lipid Panel No results found for: CHOL, TRIG, HDL, CHOLHDL, VLDL, LDLCALC, LDLDIRECT    Wt Readings from Last 3 Encounters:  08/11/17 258 lb 3.2 oz (117.1 kg)  07/22/16 266 lb (120.7 kg)  07/22/16 266 lb (120.7 kg)      ASSESSMENT AND PLAN: 1  Dyspnea  Overall volume status looks OK  I am not convinced that symptoms represent angina   Would reocmm checking labs and alos setting up for echo to eval LVEF and filling properties    2  Atrial fib  Paroxysmal  Keep on Xarelto  3  HL  Continue Crestor  Will check lipds    4  VT ICD in place    5  ? Claudication  Will set up for LE dopplers     Pt lives in [place]  I told him he needs to choose place for care  [place] vs [place]  He prefers [place] due to distance   F/U based on test results      Current medicines are reviewed at  length with the patient today.  The patient does not have concerns regarding medicines.  Signed, Dietrich Pates, MD  08/11/2017 4:12 PM    [facility] [address] [place], [place], [place]  [address] Phone: [phone]; Fax: [phone]

## 2017-08-12 ENCOUNTER — Other Ambulatory Visit: Payer: Self-pay | Admitting: Internal Medicine

## 2017-08-12 ENCOUNTER — Telehealth: Payer: Self-pay | Admitting: Internal Medicine

## 2017-08-12 DIAGNOSIS — M79661 Pain in right lower leg: Secondary | ICD-10-CM

## 2017-08-12 DIAGNOSIS — M79662 Pain in left lower leg: Principal | ICD-10-CM

## 2017-08-12 LAB — BASIC METABOLIC PANEL
BUN / CREAT RATIO: 12 (ref 10–24)
BUN: 18 mg/dL (ref 8–27)
CHLORIDE: 106 mmol/L (ref 96–106)
CO2: 21 mmol/L (ref 20–29)
CREATININE: 1.55 mg/dL — AB (ref 0.76–1.27)
Calcium: 8.7 mg/dL (ref 8.6–10.2)
GFR calc non Af Amer: 48 mL/min/{1.73_m2} — ABNORMAL LOW (ref >59)
GFR, EST AFRICAN AMERICAN: 55 mL/min/{1.73_m2} — AB (ref >59)
Glucose: 81 mg/dL (ref 65–99)
Potassium: 4.2 mmol/L (ref 3.5–5.2)
Sodium: 144 mmol/L (ref 134–144)

## 2017-08-12 LAB — LIPID PANEL
CHOL/HDL RATIO: 3.6 ratio (ref 0.0–5.0)
Cholesterol, Total: 135 mg/dL (ref 100–199)
HDL: 38 mg/dL — ABNORMAL LOW (ref >39)
LDL Calculated: 83 mg/dL (ref 0–99)
Triglycerides: 68 mg/dL (ref 0–149)
VLDL Cholesterol Cal: 14 mg/dL (ref 5–40)

## 2017-08-12 LAB — CBC
HEMATOCRIT: 44.1 % (ref 37.5–51.0)
Hemoglobin: 15.1 g/dL (ref 13.0–17.7)
MCH: 28.8 pg (ref 26.6–33.0)
MCHC: 34.2 g/dL (ref 31.5–35.7)
MCV: 84 fL (ref 79–97)
Platelets: 162 10*3/uL (ref 150–379)
RBC: 5.25 x10E6/uL (ref 4.14–5.80)
RDW: 15 % (ref 12.3–15.4)
WBC: 5.8 10*3/uL (ref 3.4–10.8)

## 2017-08-12 LAB — TSH: TSH: 1.77 u[IU]/mL (ref 0.450–4.500)

## 2017-08-12 MED ORDER — ROSUVASTATIN CALCIUM 10 MG PO TABS
10.0000 mg | ORAL_TABLET | ORAL | 11 refills | Status: DC
Start: 2017-08-12 — End: 2023-09-13

## 2017-08-12 NOTE — Telephone Encounter (Signed)
Connor Burns is calling because he has questions about the defib placement that he discussed with Dr. Tenny Craw . Please call

## 2017-08-12 NOTE — Telephone Encounter (Signed)
Please set him up to establish in EP clinic

## 2017-08-12 NOTE — Telephone Encounter (Signed)
08/21 called no answer

## 2017-08-12 NOTE — Telephone Encounter (Signed)
Reviewed lab results with pt.  He will increase the Crestor to 10 mg a day.  He is questioning where he is supposed to have his ICD followed.  He has been having it followed in [place] but believes Dr Tenny Craw wants him to come here from now on.  Advised I will forward this to her for clarification and someone will c/b with further instructions.

## 2017-08-12 NOTE — Telephone Encounter (Signed)
Follow up    Pt returning call about lab results and he still has questions about defib placement

## 2017-08-14 NOTE — Telephone Encounter (Signed)
This has been forwarded to EP scheduling.

## 2017-08-21 ENCOUNTER — Other Ambulatory Visit (HOSPITAL_COMMUNITY): Payer: BC Managed Care – PPO

## 2017-08-21 ENCOUNTER — Ambulatory Visit (HOSPITAL_COMMUNITY): Payer: BC Managed Care – PPO

## 2017-08-22 ENCOUNTER — Other Ambulatory Visit (HOSPITAL_COMMUNITY): Payer: BC Managed Care – PPO

## 2017-09-05 ENCOUNTER — Encounter: Payer: Self-pay | Admitting: Internal Medicine

## 2017-09-05 ENCOUNTER — Ambulatory Visit (HOSPITAL_COMMUNITY): Payer: BC Managed Care – PPO

## 2017-09-05 ENCOUNTER — Other Ambulatory Visit (HOSPITAL_COMMUNITY): Payer: BC Managed Care – PPO

## 2017-09-24 ENCOUNTER — Other Ambulatory Visit: Payer: Self-pay | Admitting: Internal Medicine

## 2017-09-24 ENCOUNTER — Other Ambulatory Visit: Payer: Self-pay

## 2017-09-24 ENCOUNTER — Ambulatory Visit (HOSPITAL_BASED_OUTPATIENT_CLINIC_OR_DEPARTMENT_OTHER): Payer: BC Managed Care – PPO

## 2017-09-24 ENCOUNTER — Ambulatory Visit (HOSPITAL_COMMUNITY)
Admission: RE | Admit: 2017-09-24 | Discharge: 2017-09-24 | Disposition: A | Discharge status: Home / Self Care | Payer: BC Managed Care – PPO | Source: Ambulatory Visit | Attending: Cardiovascular Disease | Admitting: Cardiovascular Disease

## 2017-09-24 ENCOUNTER — Encounter (HOSPITAL_COMMUNITY): Payer: Self-pay | Admitting: Radiology

## 2017-09-24 DIAGNOSIS — I739 Peripheral vascular disease, unspecified: Secondary | ICD-10-CM

## 2017-09-24 DIAGNOSIS — I2510 Atherosclerotic heart disease of native coronary artery without angina pectoris: Secondary | ICD-10-CM | POA: Insufficient documentation

## 2017-09-24 DIAGNOSIS — M79661 Pain in right lower leg: Secondary | ICD-10-CM | POA: Diagnosis not present

## 2017-09-24 DIAGNOSIS — I219 Acute myocardial infarction, unspecified: Secondary | ICD-10-CM | POA: Diagnosis not present

## 2017-09-24 DIAGNOSIS — Z8673 Personal history of transient ischemic attack (TIA), and cerebral infarction without residual deficits: Secondary | ICD-10-CM | POA: Diagnosis not present

## 2017-09-24 DIAGNOSIS — M79662 Pain in left lower leg: Secondary | ICD-10-CM

## 2017-09-24 DIAGNOSIS — I10 Essential (primary) hypertension: Secondary | ICD-10-CM | POA: Insufficient documentation

## 2017-09-24 DIAGNOSIS — I4891 Unspecified atrial fibrillation: Secondary | ICD-10-CM | POA: Diagnosis not present

## 2017-09-24 DIAGNOSIS — I081 Rheumatic disorders of both mitral and tricuspid valves: Secondary | ICD-10-CM | POA: Insufficient documentation

## 2017-09-24 MED ORDER — PERFLUTREN LIPID MICROSPHERE
1.0000 mL | INTRAVENOUS | Status: AC | PRN
  Administered 2017-09-24: 2 mL via INTRAVENOUS

## 2017-09-30 ENCOUNTER — Telehealth: Payer: Self-pay | Admitting: Internal Medicine

## 2017-09-30 NOTE — Telephone Encounter (Signed)
New message    Pt is returning call to [facility] about test results.

## 2017-09-30 NOTE — Telephone Encounter (Signed)
Informed pt of echo results. Pt verbalized understanding. 

## 2017-10-01 ENCOUNTER — Other Ambulatory Visit: Payer: Self-pay | Admitting: Neurology

## 2017-10-02 ENCOUNTER — Other Ambulatory Visit: Payer: Self-pay | Admitting: *Deleted

## 2017-10-02 MED ORDER — CYCLOBENZAPRINE HCL 5 MG PO TABS: 5.0000 mg | ORAL_TABLET | Freq: Every day | ORAL | 0 refills | Status: DC

## 2017-10-14 ENCOUNTER — Ambulatory Visit (INDEPENDENT_AMBULATORY_CARE_PROVIDER_SITE_OTHER): Payer: BC Managed Care – PPO | Admitting: Podiatry

## 2017-10-14 ENCOUNTER — Encounter: Payer: Self-pay | Admitting: Podiatry

## 2017-10-14 DIAGNOSIS — B351 Tinea unguium: Secondary | ICD-10-CM | POA: Diagnosis not present

## 2017-10-14 DIAGNOSIS — L600 Ingrowing nail: Secondary | ICD-10-CM

## 2017-10-14 NOTE — Patient Instructions (Signed)
Seen for hypertrophic nails. Dystrophic nail on 2nd and 3rd noted left foot. No open lesions noted. All nails debrided. Return in 3 months or as needed.

## 2017-10-14 NOTE — Progress Notes (Signed)
SUBJECTIVE: 61 y.o. year old male presents stating that he went to see a vascular specialist as recommended. He was told that he needs have toe amputated. He went out and saw another podiatrist who advised him that the toe is fine and no need for amputation. Today patient came in with dystrophic nail on 2nd and 3rd digit left with a piece that look like a callused tissue that fell off from his toe. He is not sure of it and wants to know what it is.  HPI: He was seen here in 2015 for possible osteomyelitis on 2nd toe left. He was referred out for vascular consultation. He was recommended to have digital amputation that he refused to follow through. The toe healed off without difficulty. Now he will have doubts on any future referrals based on conversation during the visit.  Review of Systems  Constitutional: Negative for chills, diaphoresis, fever, malaise/fatigue and weight loss.  HENT: Negative for ear discharge, ear pain, hearing loss and tinnitus.   Eyes: Negative for blurred vision, double vision, photophobia and pain.  Respiratory: Negative for cough, hemoptysis, sputum production and shortness of breath.   Cardiovascular: Negative for chest pain, palpitations, orthopnea and claudication.       Open heart surgery in 2004.  Gastrointestinal: Negative for heartburn, nausea and vomiting.  Genitourinary: Positive for frequency. Negative for dysuria, hematuria and urgency.       Off and on gets frequency issue.   Musculoskeletal: Negative for back pain, joint pain, myalgias and neck pain.       Ges leg cramps 2-3 times every 2-3 months. Weak left lower limb.  Neurological: Negative for dizziness, sensory change, speech change, focal weakness and headaches.   OBJECTIVE: DERMATOLOGIC EXAMINATION: Dystrophic nails on 2nd and 3rd left foot.  VASCULAR EXAMINATION OF LOWER LIMBS: Posterior tibialis pulses are not palpable bilateral. Dorsalis Pedis artery: left faintly palpable and right is  not palpable.  Capillary Filling times within 3 seconds in all digits.  No edema or erythema noted. Temperature gradient from tibial crest to dorsum of foot is within normal bilateral.  NEUROLOGIC EXAMINATION OF THE LOWER LIMBS: All epicritic and tactile sensations grossly intact. Sharp and Dull discriminatory sensations at the plantar ball of hallux is intact bilateral.   MUSCULOSKELETAL EXAMINATION: Shortened 2nd toe left.  ASSESSMENT: Dystrophic ingrown nail 2nd and 3rd left. Hx of osteomyelitis 2nd toe distal phalanx left.  PLAN: Reviewed findings and available treatment options. All nails debrided. Return as needed.

## 2017-10-20 ENCOUNTER — Ambulatory Visit (INDEPENDENT_AMBULATORY_CARE_PROVIDER_SITE_OTHER): Payer: BC Managed Care – PPO | Admitting: Neurology

## 2017-10-20 ENCOUNTER — Encounter: Payer: Self-pay | Admitting: Neurology

## 2017-10-20 VITALS — BP 124/80 | HR 80 | Ht 74.0 in | Wt 247.1 lb

## 2017-10-20 DIAGNOSIS — G609 Hereditary and idiopathic neuropathy, unspecified: Secondary | ICD-10-CM | POA: Diagnosis not present

## 2017-10-20 DIAGNOSIS — R252 Cramp and spasm: Secondary | ICD-10-CM

## 2017-10-20 MED ORDER — CYCLOBENZAPRINE HCL 5 MG PO TABS: 5.0000 mg | ORAL_TABLET | Freq: Every day | ORAL | 3 refills | Status: DC

## 2017-10-20 NOTE — Progress Notes (Signed)
[facility] Clinic Note - Initial Visit   Date: 10/20/17  Connor Burns. MRN: [id number] DOB: 1956/08/12   Dear Dr. Dorthy Cooler:  Thank you for your kind referral of Connor Burns. for consultation of muscle cramps and numbness. Although his history is well known to you, please allow [place] to reiterate it for the purpose of our medical record. The patient was accompanied to the clinic by self.    History of Present Illness: Connor Burns. is a 61 y.o. 61 y.o. right-handed African American male with atrial fibrillation on Xeralto, CAD, hypertension, hyperlipidemia, TIA, peripheral vascular disease presenting for evaluation of leg cramps and weakness.    Over the past few years, he has had cramps intermittently but over the past few months, they have become more frequent.  He does not have severe pain with the cramps, and has noticed that repositioning it helps.    He has long history of numbness involving the lower legs and feet.  He is unable to sense tactile stimuli.  His endorses imbalance and reports when walking on a treadmill he always has to hold on the hand rails.  He walks independently and has not had any falls.  NCS/EMG performed in April showed subacute sensorimotor polyneuropathy affecting the legs.  He does not have numbness/tingling of the hands.  He has been told that his sugars are borderline and has not been diagnosed with diabetes.  No history of alcohol use or family history of neuropathy.  He is mostly bothered by the fact that he always feel rundown and tired.  He is aware that he needs to loose weight.  He has been going to the gym, but that started eating a lot of sweets.  His mood has been down and sometimes he feels sad, especially regarding his daughter who is developmentally-delayed.  He lives at home with his wife and 51 year old daughter.   UPDATE 10/20/2017:  He is here for 1-year follow-up visit.  He has no new neurological  complaints and states that his numbness is less intense and attributes this to cutting out sweets from his diet. It affects the feet to the level of the ankles and feels better when he keeps it wrapped in ace bandage. There is no associated pain or tingling.  His muscle cramps are well-controlled on flexeril 54m at bedtime for which is is requesting refills.  He stays active and goes to the gym four times per week.  He has not had any falls or hospitalizations.     Past Medical History:  Diagnosis Date  . Atrial fibrillation ([facility]   . CAD (coronary artery disease)   . DVT (deep venous thrombosis) ([facility]   . Hypertension   . Myocardial infarction ([facility]   . Peripheral vascular disease ([facility]   . Stroke ([facility]     Past Surgical History:  Procedure Laterality Date  . CARDIAC DEFIBRILLATOR PLACEMENT    . CORONARY ARTERY BYPASS GRAFT  10/2003  . PR VEIN BYPASS GRAFT,AORTO-FEM-POP       Medications:  Outpatient Encounter Prescriptions as of 10/20/2017  Medication Sig  . aspirin 81 MG chewable tablet Chew by mouth.  . cyclobenzaprine (FLEXERIL) 5 MG tablet Take 1 tablet (5 mg total) by mouth at bedtime.  .Marland Kitchenlisinopril (PRINIVIL,ZESTRIL) 10 MG tablet Take 10 mg by mouth daily.  . rivaroxaban (XARELTO) 20 MG TABS tablet Take 1 tablet (20 mg total) by mouth daily with supper.  . rosuvastatin (CRESTOR) 10 MG tablet Take  1 tablet (10 mg total) by mouth every other day.  . sildenafil (VIAGRA) 100 MG tablet Take by mouth.  . sotalol (BETAPACE) 80 MG tablet   . [DISCONTINUED] cyclobenzaprine (FLEXERIL) 5 MG tablet Take 1 tablet (5 mg total) by mouth at bedtime.   No facility-administered encounter medications on file as of 10/20/2017.      Allergies:  Allergies  Allergen Reactions  . Clopidogrel Anaphylaxis and Other (See Comments)    "Wierd, like out of body thing" Out of body experience   . Plavix [Clopidogrel Bisulfate]     Family History: Family History  Problem Relation Age of Onset   . Hyperlipidemia Father   . Hypertension Father   . Heart disease Mother        before age 69  . Hyperlipidemia Mother   . Hypertension Mother   . Heart attack Mother     Social History: Social History  Substance Use Topics  . Smoking status: Never Smoker  . Smokeless tobacco: Never Used  . Alcohol use No   Social History   Social History Narrative   Lives with wife in a 2 story home.  Has 3 children.     Works as a Research scientist (medical).     Education: Masters in [employer].    Review of Systems:  CONSTITUTIONAL: No fevers, chills, night sweats, or weight loss.   EYES: No visual changes or eye pain ENT: No hearing changes.  No history of nose bleeds.   RESPIRATORY: No cough, wheezing and shortness of breath.   CARDIOVASCULAR: Negative for chest pain, and palpitations.   GI: Negative for abdominal discomfort, blood in stools or black stools.  No recent change in bowel habits.   GU:  No history of incontinence.   MUSCLOSKELETAL: No history of joint pain or swelling.  No myalgias.   SKIN: Negative for lesions, rash, and itching.   HEMATOLOGY/ONCOLOGY: Negative for prolonged bleeding, bruising easily, and swollen nodes.  No history of cancer.   ENDOCRINE: Negative for cold or heat intolerance, polydipsia or goiter.   PSYCH:  +depression or anxiety symptoms.   NEURO: As Above.   Vital Signs:  BP 124/80   Pulse 80   Ht 6' 2"  (1.88 m)   Wt 247 lb 2 oz (112.1 kg)   SpO2 99%   BMI 31.73 kg/m      General:  Well appearing, comfortable.  Neurological Exam: MENTAL STATUS including orientation to time, place, person, recent and remote memory, attention span and concentration, language, and fund of knowledge is normal.  Speech is not dysarthric.  CRANIAL NERVES:  Face is symmetric.   MOTOR:  Motor strength is 5/5 in all extremities. No atrophy, fasciculations or abnormal movements.  No pronator drift.  Tone is normal.    MSRs:  Reflexes are 2+/4 throughout, except absent  Achilles bilaterally.  SENSORY:  Vibration is reduced at the great toe bilaterally.  COORDINATION/GAIT: Gait narrow based and stable.    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   IMPRESSION: 1. 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  Clinically, improved due to reducing his intake of sugar.  His neuropathy labs have  bee unrevealing. Encouraged him to continue low sugar/carbohydrate diet and exercise program.  He currently findings relief with using an ace bandage and wrapping his feet. I suggested that may benefit from diabetes stockings and information for local medical supply stores was provided.   2.  Muscle cramps, well-controlled on flexeril 7m at bedtime.  Refills provided for 1 year.  Greater than 50% of this 20 minute visit was spent in counseling, explanation of diagnosis, planning of further management, and coordination of care.   Thank you for allowing me to participate in patient's care.  If I can answer any additional questions, I would be pleased to do so.    Sincerely,    Martese Vanatta K. PPosey Pronto DO

## 2017-10-20 NOTE — Patient Instructions (Addendum)
Continue flexeril 5mg  at bedtime.    Go to a medical supply store  [facility] Address: [address] [place], [place], [place] [address]  Phone: [phone]   [employer]  [address] [place], [place] [address]  Phone: [phone]  If you do not have any new neurological symptoms, request your primary care doctor to take over writing the prescription for flexeril 5mg  at your annual visits.    Return to clinic as needed

## 2017-12-31 IMAGING — DX DG CHEST 2V
2 series · 2 of 2 positions shown · non-contrast
Comparison: 07/10/2015

CLINICAL DATA: Patient c/o shortness of breath with exertion x a
few weeks; hx a-fib, HTN, non smoker, CABG 5778, defibrillator 9434

EXAM:
CHEST  2 VIEW

[dg chest 2 view (1 of 2)]
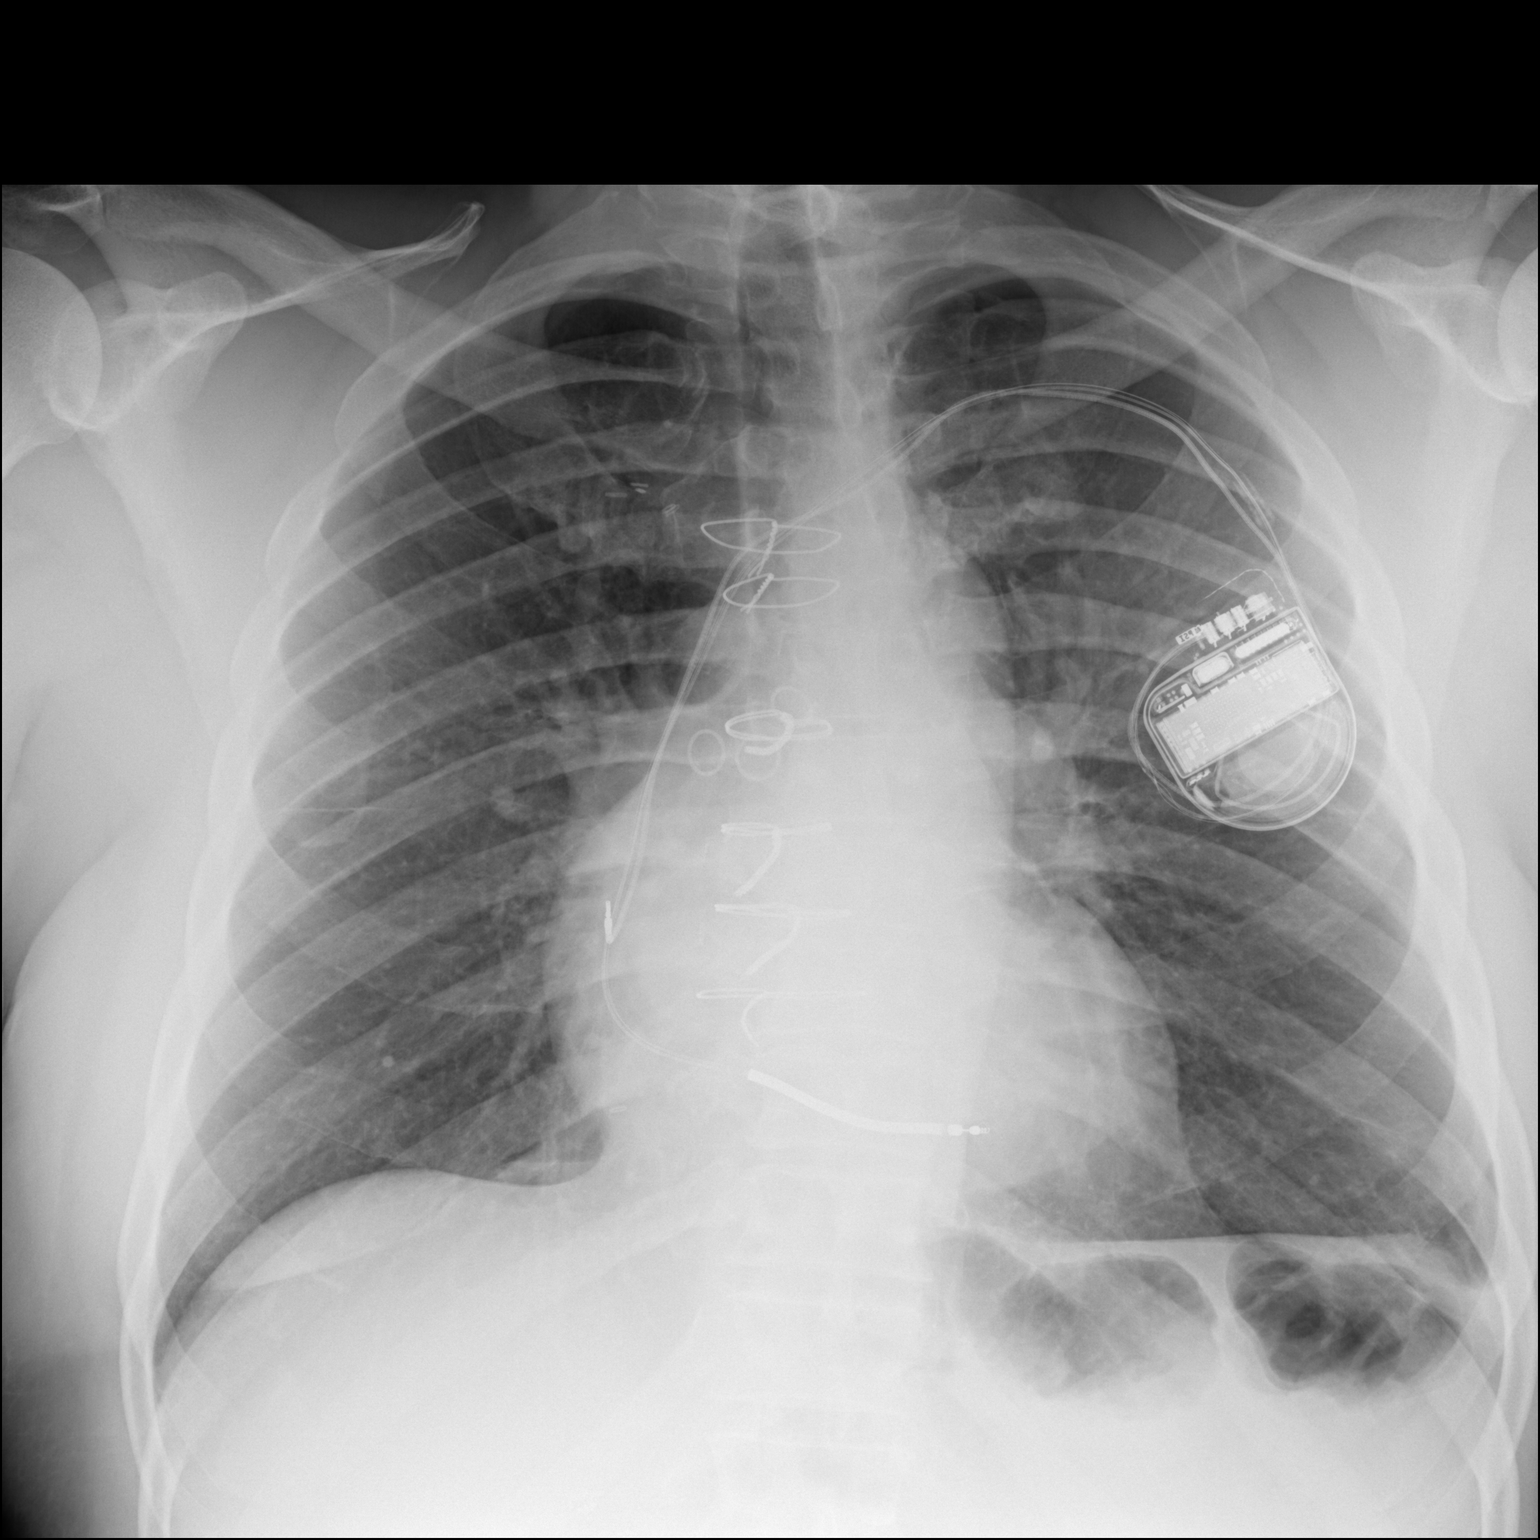

[dg chest 2 view (2 of 2)]
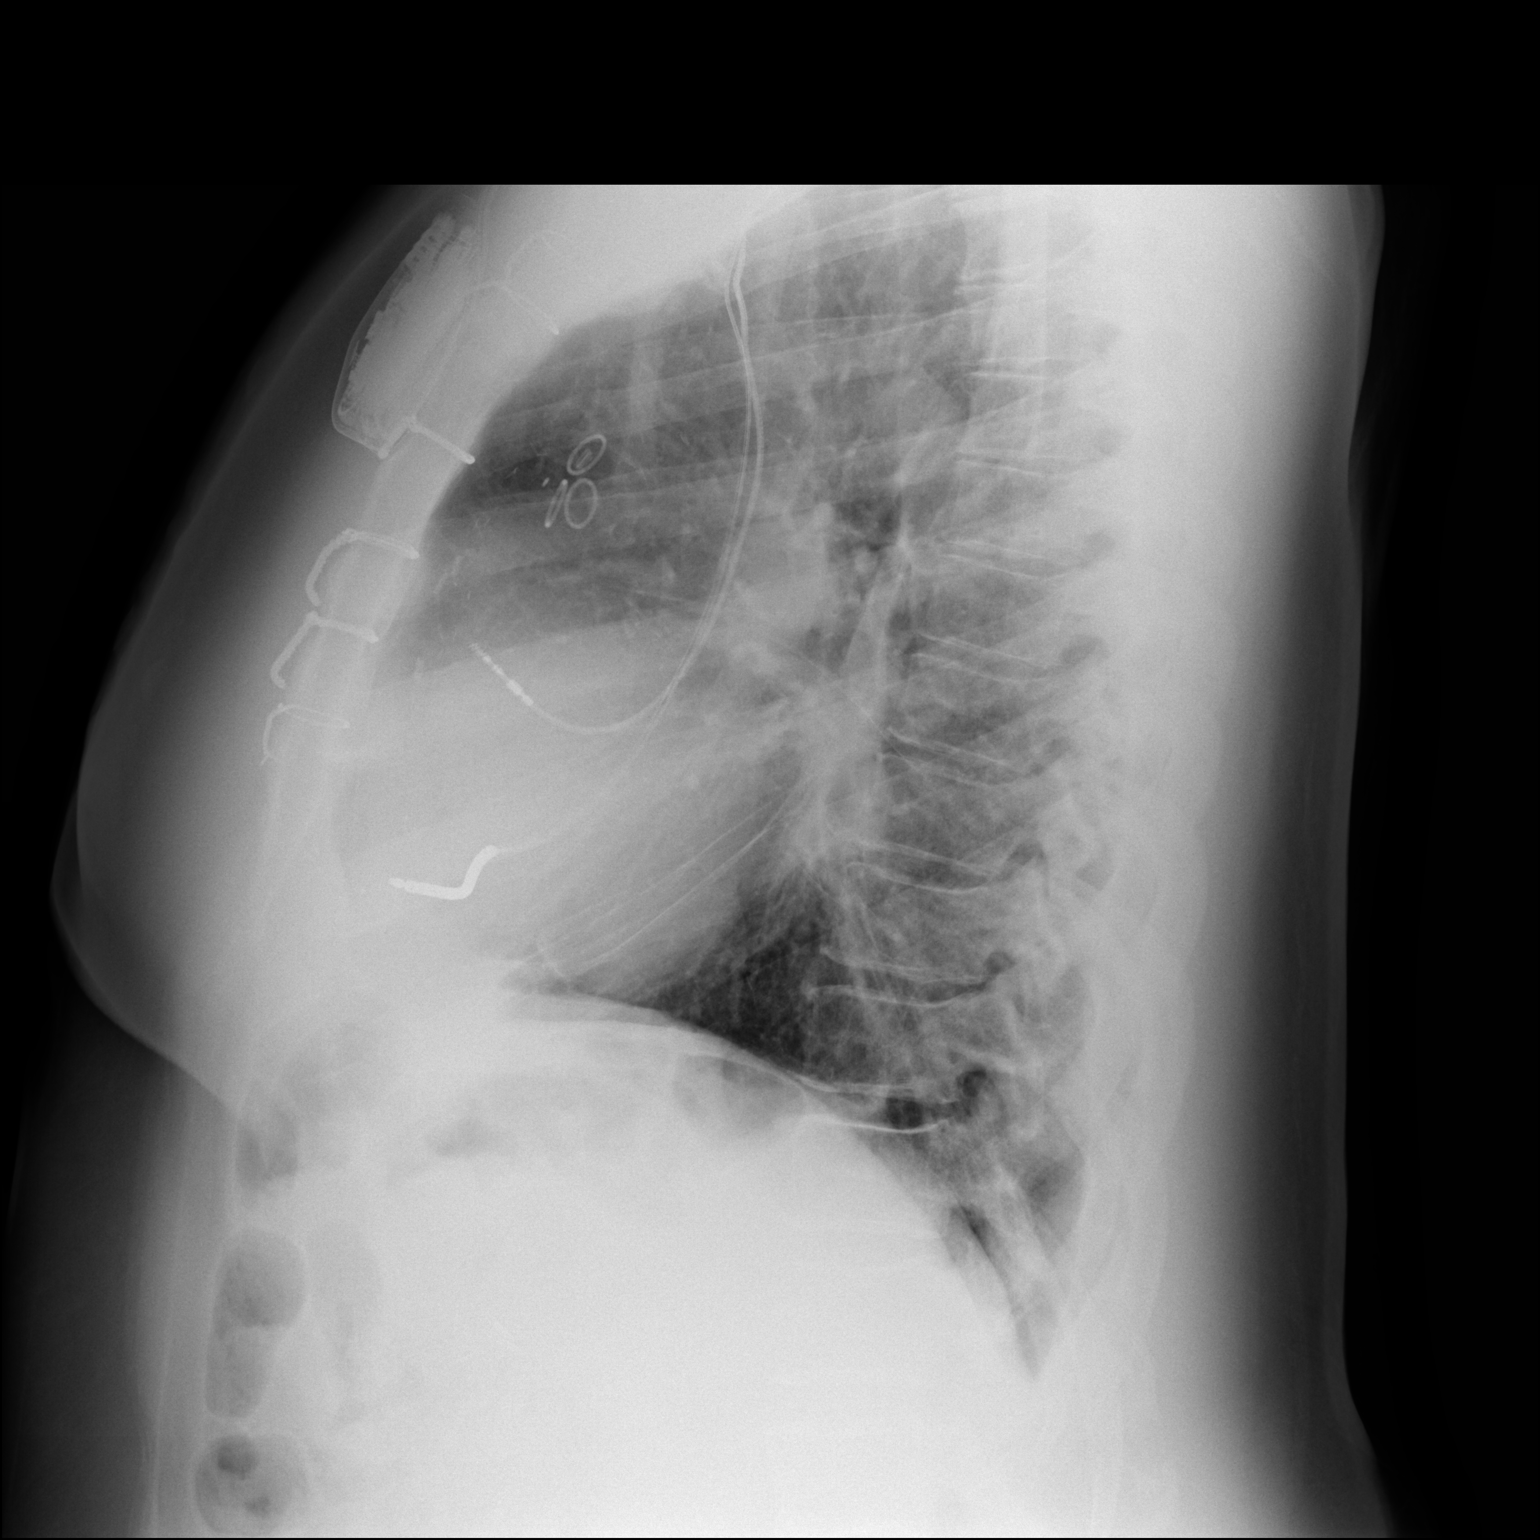

[2 of 2 positions shown; findings below may reference images not displayed]

FINDINGS: There stable changes from prior CABG surgery. The cardiac silhouette
is borderline enlarged. No mediastinal or hilar masses. No evidence
of adenopathy.

Clear lungs.  No pleural effusion or pneumothorax.

Left anterior chest wall sequential pacemaker is stable and well
positioned.

Skeletal structures are intact.
IMPRESSION: 1. No acute cardiopulmonary disease.

## 2018-03-30 ENCOUNTER — Other Ambulatory Visit: Payer: Self-pay | Admitting: Internal Medicine

## 2018-03-30 NOTE — Telephone Encounter (Signed)
Xarelto 20mg  refill request received; pt is 62 yrs old, Wt-112.1kg, Crea-1.55 on 08/11/17, last seen by Dr. Tenny Craw on 08/11/17, CrCl-79.43ml/min. Will send in refill to requested pharmacy.

## 2018-05-07 DIAGNOSIS — E785 Hyperlipidemia, unspecified: Secondary | ICD-10-CM | POA: Insufficient documentation

## 2018-05-07 DIAGNOSIS — I10 Essential (primary) hypertension: Secondary | ICD-10-CM | POA: Insufficient documentation

## 2018-05-07 HISTORY — DX: Hyperlipidemia, unspecified: E78.5

## 2018-05-20 DIAGNOSIS — I255 Ischemic cardiomyopathy: Secondary | ICD-10-CM | POA: Insufficient documentation

## 2018-06-16 DIAGNOSIS — Z4502 Encounter for adjustment and management of automatic implantable cardiac defibrillator: Secondary | ICD-10-CM

## 2018-06-16 HISTORY — DX: Encounter for adjustment and management of automatic implantable cardiac defibrillator: Z45.02

## 2018-06-19 DIAGNOSIS — T82837A Hemorrhage of cardiac prosthetic devices, implants and grafts, initial encounter: Secondary | ICD-10-CM | POA: Insufficient documentation

## 2019-04-07 ENCOUNTER — Other Ambulatory Visit: Payer: Self-pay | Admitting: Family Medicine

## 2019-04-07 DIAGNOSIS — H5347 Heteronymous bilateral field defects: Secondary | ICD-10-CM

## 2019-04-07 DIAGNOSIS — H5340 Unspecified visual field defects: Secondary | ICD-10-CM

## 2019-04-08 ENCOUNTER — Other Ambulatory Visit: Payer: Self-pay | Admitting: Internal Medicine

## 2019-04-08 NOTE — Telephone Encounter (Addendum)
Xarelto 20mg  refill request received; pt is 62 yrs, wt-112.1kg, Crea-1.34 via [facility] from [facility], last seen by Dr. Tenny Craw in 07/2017; therefore pt is overdue for follow-up and recall was sent in 01/2018 & to date pt has not made an appt. Per Care Everywhere pt has been going to [facility] in [place], [place]. Will call them & see if can defer refill to them as pt is overdue for our Cardiologist.  Spoke with Shanda Bumps at [facility] and she states the pt has been seen and managed by their Cardiologist Dr. Zena Amos and they will take care of this. Refill denied from our facility at this time.

## 2019-04-09 ENCOUNTER — Other Ambulatory Visit: Payer: Self-pay | Admitting: Internal Medicine

## 2019-04-09 NOTE — Telephone Encounter (Signed)
This rx has already been denied, with the new cardiologists name sent in the deial through escripts.  Pt has not seen Dr Tenny Craw since 08/11/17.  Pt has been seeing Dr Deatra Ina at [facility] in [place].  Rx refill should be sent to his new cardiologist for refill. Attempted to call [facility] they are at lunch and will not return until after 2pm.  Left VM on physician line containing the above information. Denying rx refill again through escripts to the [facility].

## 2019-04-10 ENCOUNTER — Other Ambulatory Visit: Payer: Self-pay | Admitting: Internal Medicine

## 2019-04-12 NOTE — Telephone Encounter (Signed)
Pt being seen at [facility] now - will deny refill

## 2019-11-29 ENCOUNTER — Other Ambulatory Visit: Payer: Self-pay | Admitting: Family Medicine

## 2019-11-29 DIAGNOSIS — I739 Peripheral vascular disease, unspecified: Secondary | ICD-10-CM

## 2020-03-03 ENCOUNTER — Encounter: Payer: Self-pay | Admitting: General Practice

## 2020-04-19 ENCOUNTER — Other Ambulatory Visit: Payer: Self-pay | Admitting: Family Medicine

## 2020-04-19 DIAGNOSIS — I739 Peripheral vascular disease, unspecified: Secondary | ICD-10-CM

## 2020-04-24 ENCOUNTER — Ambulatory Visit
Admission: RE | Admit: 2020-04-24 | Discharge: 2020-04-24 | Disposition: A | Discharge status: Home / Self Care | Payer: BC Managed Care – PPO | Source: Ambulatory Visit | Attending: Family Medicine | Admitting: Family Medicine

## 2020-04-24 DIAGNOSIS — I739 Peripheral vascular disease, unspecified: Secondary | ICD-10-CM

## 2020-10-20 ENCOUNTER — Other Ambulatory Visit: Payer: Self-pay

## 2020-10-20 ENCOUNTER — Ambulatory Visit (INDEPENDENT_AMBULATORY_CARE_PROVIDER_SITE_OTHER): Payer: BC Managed Care – PPO

## 2020-10-20 ENCOUNTER — Encounter: Payer: Self-pay | Admitting: Podiatry

## 2020-10-20 ENCOUNTER — Ambulatory Visit: Payer: BC Managed Care – PPO | Admitting: Podiatry

## 2020-10-20 DIAGNOSIS — L97524 Non-pressure chronic ulcer of other part of left foot with necrosis of bone: Secondary | ICD-10-CM

## 2020-10-20 DIAGNOSIS — I999 Unspecified disorder of circulatory system: Secondary | ICD-10-CM | POA: Diagnosis not present

## 2020-10-20 MED ORDER — DOXYCYCLINE HYCLATE 100 MG PO TABS: 100.0000 mg | ORAL_TABLET | Freq: Two times a day (BID) | ORAL | 0 refills | Status: DC

## 2020-10-20 NOTE — Progress Notes (Signed)
Subjective:  Patient ID: Connor Mina., adult    DOB: 21-Mar-1956,  MRN: [id number]  Chief Complaint  Patient presents with  . Wound Check    wound to left foot 3rd toe. PT stated that it is somtimes painful     64 y.o. adult presents for wound care.  Patient presents with left lower toe wound that is probing down to bone..  She states that he has had some pain to the third toe and sometimes when he stands up he feels like is about to fall over because of the pain in the toe.  He states that he has not seen anyone else prior to seeing me.  He says that there is a piece of bone that came out of the toe in the past.  He does not know how long the wound has been present.  He has not been keeping any dressings on it.  He denies any other systemic signs of infection.   Review of Systems: Negative except as noted in the HPI. Denies N/V/F/Ch.  Past Medical History:  Diagnosis Date  . Atrial fibrillation (HCC)   . CAD (coronary artery disease)   . DVT (deep venous thrombosis) (HCC)   . Hypertension   . Myocardial infarction (HCC)   . Peripheral vascular disease (HCC)   . Stroke [facility])     Current Outpatient Medications:  .  furosemide (LASIX) 20 MG tablet, TAKE 1 TABLET BY MOUTH AS NEEDED FOR SWELLING, Disp: , Rfl:  .  oxybutynin (DITROPAN-XL) 10 MG 24 hr tablet, Take by mouth., Disp: , Rfl:  .  sildenafil (REVATIO) 20 MG tablet, Take by mouth., Disp: , Rfl:  .  aspirin 81 MG chewable tablet, Chew by mouth., Disp: , Rfl:  .  atorvastatin (LIPITOR) 20 MG tablet, Take by mouth., Disp: , Rfl:  .  colchicine 0.6 MG tablet, Take 0.6 mg by mouth 2 (two) times daily., Disp: , Rfl:  .  cyclobenzaprine (FLEXERIL) 5 MG tablet, Take 1 tablet (5 mg total) by mouth at bedtime., Disp: 90 tablet, Rfl: 3 .  diclofenac (VOLTAREN) 75 MG EC tablet, Take 75 mg by mouth 2 (two) times daily., Disp: , Rfl:  .  doxycycline (VIBRA-TABS) 100 MG tablet, Take 1 tablet (100 mg total) by mouth 2 (two) times daily.,  Disp: 28 tablet, Rfl: 0 .  lisinopril (PRINIVIL,ZESTRIL) 10 MG tablet, Take 10 mg by mouth daily., Disp: , Rfl:  .  rosuvastatin (CRESTOR) 10 MG tablet, Take 1 tablet (10 mg total) by mouth every other day., Disp: 30 tablet, Rfl: 11 .  sildenafil (VIAGRA) 100 MG tablet, Take by mouth., Disp: , Rfl:  .  sotalol (BETAPACE) 80 MG tablet, , Disp: , Rfl:  .  XARELTO 20 MG TABS tablet, TAKE 1 TABLET BY MOUTH ONCE DAILY WITH SUPPER, Disp: 30 tablet, Rfl: 6  Social History   Tobacco Use  Smoking Status Never Smoker  Smokeless Tobacco Never Used    Allergies  Allergen Reactions  . Clopidogrel Anaphylaxis and Other (See Comments)    "Wierd, like out of body thing" Out of body experience   . Plavix [Clopidogrel Bisulfate]    Objective:  There were no vitals filed for this visit. There is no height or weight on file to calculate BMI. Constitutional Well developed. Well nourished.  Vascular Dorsalis pedis pulses non palpable bilaterally. Posterior tibial pulses non palpable bilaterally. Capillary refill normal to all digits.  No cyanosis or clubbing noted. Pedal hair growth normal.  Neurologic Normal speech. Oriented to person, place, and time. Protective sensation absent  Dermatologic Wound Location: Left third digit ulceration probing down to bone.  No purulent drainage noted.  No redness noted. Wound Base: Mixed Granular/Fibrotic Peri-wound: Calloused Exudate: Scant/small amount Serosanguinous exudate Wound Measurements: -See below  Orthopedic: No pain to palpation either foot.   Radiographs: 3 views of skeletally mature adult left foot: There is mild osteoarthritic changes noted to the distal phalanx of the left third digit.  No soft tissue emphysema noted. Assessment:   1. Chronic toe ulcer, left, with necrosis of bone (HCC)   2. Vascular abnormality    Plan:  Patient was evaluated and treated and all questions answered.  Ulcer left third digit with probing down to  bone -Debridement as below. -Dressed with Betadine wet-to-dry, DSD. -Continue off-loading with surgical shoe.  Surgical shoe was dispensed -Patient is a high risk of amputation of the toe/the foot.  Given that patient has possible poor circulation to the left lower extremity I believe patient will benefit from vascular study and further evaluation with a possible intervention.  He will be scheduled to get ABIs PVRs done and assess the vascular flow to the left lower extremity. -Patient does have osteomyelitis clinically and even radiographically of the distal phalanx of the third digit.  At this time he has no other clinical signs of infection.  For now I would like to assess his vascular flow prior to amputating his third toe to assess if his not be able to heal appropriately.  If there is any other clinical signs of infection such as cellulitis or purulent drainage have asked him to go to the emergency room right away.  Patient states understanding -Doxycycline was dispensed for skin and soft tissue prophylaxis  Procedure: Excisional Debridement of Wound Tool: Sharp chisel blade/tissue nipper Rationale: Removal of non-viable soft tissue from the wound to promote healing.  Anesthesia: none Pre-Debridement Wound Measurements: 1 cm x 0.5 cm x 0.5 cm  Post-Debridement Wound Measurements: 1.1 cm x 0.6 cm x 0.5 cm  Type of Debridement: Sharp Excisional Tissue Removed: Non-viable soft tissue Blood loss: Minimal (<50cc) Depth of Debridement: To the bone Technique: Sharp excisional debridement to bleeding, viable wound base.  Wound Progress: There is my initial evaluation.  I will continue to monitor the progression of it. Site healing conversation 7 Dressing: Dry, sterile, compression dressing. Disposition: Patient tolerated procedure well. Patient to return in 1 week for follow-up.  No follow-ups on file.

## 2020-10-24 ENCOUNTER — Other Ambulatory Visit: Payer: Self-pay | Admitting: Podiatry

## 2020-10-24 ENCOUNTER — Other Ambulatory Visit: Payer: Self-pay

## 2020-10-24 ENCOUNTER — Ambulatory Visit (HOSPITAL_COMMUNITY)
Admission: RE | Admit: 2020-10-24 | Discharge: 2020-10-24 | Disposition: A | Discharge status: Home / Self Care | Payer: BC Managed Care – PPO | Source: Ambulatory Visit | Attending: Podiatry | Admitting: Podiatry

## 2020-10-24 ENCOUNTER — Encounter: Payer: Self-pay | Admitting: Vascular Surgery

## 2020-10-24 DIAGNOSIS — I999 Unspecified disorder of circulatory system: Secondary | ICD-10-CM | POA: Insufficient documentation

## 2020-10-25 ENCOUNTER — Telehealth: Payer: Self-pay | Admitting: *Deleted

## 2020-10-25 NOTE — Telephone Encounter (Signed)
I have left 2 messages to schedule a consult for wound referred by Dr. Caryn Bee Patel./vm

## 2020-10-26 ENCOUNTER — Other Ambulatory Visit: Payer: Self-pay

## 2020-10-26 ENCOUNTER — Ambulatory Visit
Admission: RE | Admit: 2020-10-26 | Discharge: 2020-10-26 | Disposition: A | Discharge status: Home / Self Care | Payer: BC Managed Care – PPO | Source: Ambulatory Visit | Attending: Podiatry | Admitting: Podiatry

## 2020-10-26 ENCOUNTER — Encounter: Payer: Self-pay | Admitting: *Deleted

## 2020-10-26 DIAGNOSIS — I999 Unspecified disorder of circulatory system: Secondary | ICD-10-CM

## 2020-10-26 HISTORY — PX: IR RADIOLOGIST EVAL & MGMT: IMG5224

## 2020-10-26 NOTE — Consult Note (Signed)
Chief Complaint: Left foot wound  Referring Physician(s): Patel,Kevin P PCP: Dr. Docia Chuck  History of Present Illness: Connor Burns. is a 64 y.o. male presenting as a scheduled consultation to VIR clinic today, kindly referred by Dr. Nicholes Rough of [facility], for evaluation of left foot wound and candidacy for revascularization.   Connor Burns joins [place] today by telephone.  We confirmed his identity with 2 personal identifiers.   Connor Burns says he has a wound on the left 3rd toe, and he feels like there has been a piece of bone that has been expressed.  He tells me that he is unsure of how long the wound has been present.  He has started care with Dr. Allena Katz as of 10/20/20.    He has never had a problem with foot wound before.  I cannot elicit a history of claudication. He is able to perform all of his ADL's, though does have some occasional pain in the foot.    He actually is able to go to the gym still, and does this 2-3 times per week.  He continues to work at a prison, as the [employer].    He tells me that he has a history of MI, and had a CABG performed in 2013 in [place].  He also has an implanted AICD. He reports that he has been treated for stroke/TIA at [facility].  He denies any CP or DOE.    He has a cardiologist, but cannot remember his name.  His office is [place].    He lives in [place], is married with 3 adult children.   CV risk factors include: CAD, HTN.  No DM, never smoker, no history of HLD.    ABI: 10/24/2020 Right: non compressible Left: non compressible Right segmental = triphasic DP and PT Left segmental = triphasic DP and monophasic PT   Past Medical History:  Diagnosis Date  . Atrial fibrillation (HCC)   . CAD (coronary artery disease)   . DVT (deep venous thrombosis) (HCC)   . Hypertension   . Myocardial infarction (HCC)   . Peripheral vascular disease (HCC)   . Stroke [facility])     Past Surgical History:  Procedure  Laterality Date  . CARDIAC DEFIBRILLATOR PLACEMENT    . CORONARY ARTERY BYPASS GRAFT  10/2003  . PR VEIN BYPASS GRAFT,AORTO-FEM-POP      Allergies: Clopidogrel and Plavix [clopidogrel bisulfate]  Medications: Prior to Admission medications   Medication Sig Start Date End Date Taking? Authorizing Provider  aspirin 81 MG chewable tablet Chew by mouth.    [provider]  atorvastatin (LIPITOR) 20 MG tablet Take by mouth.    [provider]  colchicine 0.6 MG tablet Take 0.6 mg by mouth 2 (two) times daily. 09/19/20   [provider]  cyclobenzaprine (FLEXERIL) 5 MG tablet Take 1 tablet (5 mg total) by mouth at bedtime. 10/20/17   Nita Sickle K, DO  diclofenac (VOLTAREN) 75 MG EC tablet Take 75 mg by mouth 2 (two) times daily. 09/21/20   [provider]  doxycycline (VIBRA-TABS) 100 MG tablet Take 1 tablet (100 mg total) by mouth 2 (two) times daily. 10/20/20   Candelaria Stagers, DPM  furosemide (LASIX) 20 MG tablet TAKE 1 TABLET BY MOUTH AS NEEDED FOR SWELLING 03/05/19   [provider]  lisinopril (PRINIVIL,ZESTRIL) 10 MG tablet Take 10 mg by mouth daily.    [provider]  oxybutynin (DITROPAN-XL) 10 MG 24 hr  tablet Take by mouth. 10/22/16   [provider]  rosuvastatin (CRESTOR) 10 MG tablet Take 1 tablet (10 mg total) by mouth every other day. 08/12/17   [name], MD  sildenafil (REVATIO) 20 MG tablet Take by mouth. 01/16/16   [provider]  sildenafil (VIAGRA) 100 MG tablet Take by mouth.    [provider]  sotalol (BETAPACE) 80 MG tablet  08/02/17   [provider]  XARELTO 20 MG TABS tablet TAKE 1 TABLET BY MOUTH ONCE DAILY WITH SUPPER 03/30/18   [name], MD     Family History  Problem Relation Age of Onset  . Hyperlipidemia Father   . Hypertension Father   . Heart disease Mother        before age 41  . Hyperlipidemia Mother   . Hypertension Mother   . Heart attack Mother      Social History   Socioeconomic History  . Marital status: Married    Spouse name: Not on file  . Number of children: Not on file  . Years of education: Not on file  . Highest education level: Not on file  Occupational History  . Not on file  Tobacco Use  . Smoking status: Never Smoker  . Smokeless tobacco: Never Used  Vaping Use  . Vaping Use: Never used  Substance and Sexual Activity  . Alcohol use: No  . Drug use: No  . Sexual activity: Not on file  Other Topics Concern  . Not on file  Social History Narrative   Lives with wife in a 2 story home.  Has 3 children.     Works as a Metallurgist.     Education: Masters in [employer].   Social Determinants of Health   Financial Resource Strain:   . Difficulty of Paying Living Expenses: Not on file  Food Insecurity:   . Worried About Programme researcher, broadcasting/film/video in the Last Year: Not on file  . Ran Out of Food in the Last Year: Not on file  Transportation Needs:   . Lack of Transportation (Medical): Not on file  . Lack of Transportation (Non-Medical): Not on file  Physical Activity:   . Days of Exercise per Week: Not on file  . Minutes of Exercise per Session: Not on file  Stress:   . Feeling of Stress : Not on file  Social Connections:   . Frequency of Communication with Friends and Family: Not on file  . Frequency of Social Gatherings with Friends and Family: Not on file  . Attends Religious Services: Not on file  . Active Member of Clubs or Organizations: Not on file  . Attends Banker Meetings: Not on file  . Marital Status: Not on file       Review of Systems  Review of Systems: A 12 point ROS discussed and pertinent positives are indicated in the HPI above.  All other systems are negative.  Physical Exam No direct physical exam was performed (except for noted visual exam findings with Video Visits).    Vital Signs: There were no vitals taken for this visit.  Imaging: DG Foot Complete  Left  Result Date: 10/20/2020 Please see detailed radiograph report in office note.  VAS [place] ABI WITH/WO TBI  Result Date: 10/24/2020 LOWER EXTREMITY DOPPLER STUDY Indications: Peripheral artery disease. High Risk         Hypertension, hyperlipidemia, coronary artery disease, prior Factors:  CVA. Other Factors: Tiredness of legs.  Comparison Study: 09/24/17 Performing Technologist: Jeb Levering RDMS, RVT  Examination Guidelines: A complete evaluation includes at minimum, Doppler waveform signals and systolic blood pressure reading at the level of bilateral brachial, anterior tibial, and posterior tibial arteries, when vessel segments are accessible. Bilateral testing is considered an integral part of a complete examination. Photoelectric Plethysmograph (PPG) waveforms and toe systolic pressure readings are included as required and additional duplex testing as needed. Limited examinations for reoccurring indications may be performed as noted.  ABI Findings: +---------+------------------+-----+----------+--------+ Right    Rt Pressure (mmHg)IndexWaveform  Comment  +---------+------------------+-----+----------+--------+ Brachial 173                                       +---------+------------------+-----+----------+--------+ ATA      255               1.47 monophasic         +---------+------------------+-----+----------+--------+ PTA      255               1.47 monophasic         +---------+------------------+-----+----------+--------+ Great Toe87                0.50 Abnormal           +---------+------------------+-----+----------+--------+ +---------+------------------+-----+----------+--------------------------------+ Left     Lt Pressure (mmHg)IndexWaveform  Comment                          +---------+------------------+-----+----------+--------------------------------+ Brachial 168                                                                +---------+------------------+-----+----------+--------------------------------+ ATA      255               1.47 monophasic                                 +---------+------------------+-----+----------+--------------------------------+ PTA      255               1.47 monophasic                                 +---------+------------------+-----+----------+--------------------------------+ Great Toe                                 Unable to obtain due to wound/                                             bandages                         +---------+------------------+-----+----------+--------------------------------+ +-------+-----------+-----------+------------+------------+ ABI/TBIToday's ABIToday's TBIPrevious ABIPrevious TBI +-------+-----------+-----------+------------+------------+ Right  [place]         0.5        0.81        1.07         +-------+-----------+-----------+------------+------------+  Left   [place]         -----------0.86        0.71         +-------+-----------+-----------+------------+------------+ Arterial wall calcification precludes accurate ankle pressures and ABIs. ABIs are falsely elevated.  Summary: Right: Resting right ankle-brachial index indicates noncompressible right lower extremity arteries. The right toe-brachial index is abnormal. Pedal artery waveforms indicate some level of arterial disease. Left: Resting left ankle-brachial index indicates noncompressible left lower extremity arteries. Pedal artery waveforms indicate some level of arterial disease.  *See table(s) above for measurements and observations.  Electronically signed by Sherald Hess MD on 10/24/2020 at 3:59:09 PM.    Final     Labs:  CBC: No results for input(s): WBC, HGB, HCT, PLT in the last 8760 hours.  COAGS: No results for input(s): INR, APTT in the last 8760 hours.  BMP: No results for input(s): NA, K, CL, CO2, GLUCOSE, BUN, CALCIUM, CREATININE, GFRNONAA, GFRAA in  the last 8760 hours.  Invalid input(s): CMP  LIVER FUNCTION TESTS: No results for input(s): BILITOT, AST, ALT, ALKPHOS, PROT, ALBUMIN in the last 8760 hours.  TUMOR MARKERS: No results for input(s): AFPTM, CEA, CA199, CHROMGRNA in the last 8760 hours.  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   Non-invasive lower extremity exam and imaging work-up shows evidence of left tibial disease, with monophasic left PT and triphasic left AT.   I had a long discussion with Connor Sheppard regarding anatomy, pathology/pathophysiology, natural history, and prognosis of PAD/CLI.  Informed consent regarding treatment strategies was performed which would possibly include surveillance/wound care with medical management, surgical strategy, and/or endovascular options, with risk/benefit discussion.  The indications for treatment supported by updated guidelines1, 2 were discussed.  Given the tibial artery compromise and symptoms, my recommendation was for angiogram and intervention.   Regarding endovascular options, specific risks discussed include: bleeding, infection, contrast reaction, renal injury/nephropathy, arterial injury/dissection, need for additional procedure/surgery, worsening symptoms/tissue including limb loss, cardiopulmonary collapse, death.   His main concern towards the end of the conversation was trouble reconciling the order of his care, as he has a debridement/possible amputation scheduled for tomorrow morning, and he would not be able to undergo angiogram/intervention until at least next week. He is not sure why the debridement is happening before the angio and why not the angio first with wait-and-see.     I emphasized that there is always concern for infection in the setting of open wound, and that debridement may be likely required no matter what, but angiogram is likely  necessary for healing capability.   He remains skeptical, and at the end of the call, did not commit to angiogram.  In fact he left the call early stating he had to take another important phone call.      Annual flu vaccination is also recommended, with Class 1 recommendation1.   Plan: - Given the above and his skepticism, I would suggest defaulting to Dr. Eliane Decree plan of proceeding with debridement, with close follow up for healing ability. If he has any sign of non-healing, I would then re-propose angiogram and possible intervention to possibly improve blood flow.  - We are happy to see him back at any point to assess for need of revascularization for wound healing capability.  - Continue maximal medical therapy for cardiovascular risk reduction, including anti-platelet therapy. -Annual flu vaccination is recommended in  the setting of known PAD, in the absence of contra-indications.    ___________________________________________________________________   1Monte Fantasia MD, et al. 2016 AHA/ACC Guideline on the Management of Patients With Lower Extremity Peripheral Artery Disease: Executive Summary: A Report of the American College of Cardiology/American Heart Association Task Force on Clinical Practice Guidelines. J Am Coll Cardiol. 2017 Mar 21;69(11):1465-1508. doi: 10.1016/j.jacc.2016.11.008.   2 - Norgren L, et al. TASC II Working Group. Inter-society consensus for the management of peripheral arterial disease. Int Nunzio Cobbs. 2007 Jun;26(2):81-157. Review. PubMed PMID: 40981191  3 - Hingorani A, et al. The management of diabetic foot: A clinical practice guideline by the Society for Vascular Surgery in collaboration with the American Podiatric Medical Association and the Society  for Vascular Medicine. J Vasc Surg. 2016 Feb;63(2 Suppl):3S-21S. doi: 10.1016/j.jvs.2015.10.003. PubMed PMID: 47829562.  4 - Luther Hearing, Saab FA, Elyse Jarvis, Danae Orleans, Deeann Cree, Driver VR, [place], Lookstein  R, van den Tilman Neat, Jaff Connor, Reinaldo Raddle, Henao S, AlMahameed A, Katzen B. Digital Subtraction Angiography Prior to an Amputation for Critical Limb Ischemia (CLI): An Expert Recommendation Statement From the CLI Global Society to Optimize Limb Salvage. J Endovasc Ther. 2020 Aug;27(4):540-546. doi: 10.1177/1526602820928590. Epub 2020 May 29. PMID: 13086578.    Thank you for this interesting consult.  I greatly enjoyed meeting Jakyri Brunkhorst. and look forward to participating in their care.  A copy of this report was sent to the requesting provider on this date.  Electronically Signed: Gilmer Mor 10/26/2020, 3:17 PM   I spent a total of  60 Minutes   in remote  clinical consultation, greater than 50% of which was counseling/coordinating care for left foot wound possible angiogram/intervention, rutherford 5 class symptoms CLI.    Visit type: Audio only (telephone). Audio (no video) only due to patient's lack of internet/smartphone capability. Alternative for in-person consultation at [facility], [address] [place], [place], [place]. This visit type was conducted due to national recommendations for restrictions regarding the COVID-19 Pandemic (e.g. social distancing).  This format is felt to be most appropriate for this patient at this time.  All issues noted in this document were discussed and addressed.

## 2020-10-27 ENCOUNTER — Encounter: Payer: Self-pay | Admitting: Podiatry

## 2020-10-27 ENCOUNTER — Ambulatory Visit (INDEPENDENT_AMBULATORY_CARE_PROVIDER_SITE_OTHER): Payer: BC Managed Care – PPO | Admitting: Podiatry

## 2020-10-27 VITALS — Temp 98.4°F

## 2020-10-27 DIAGNOSIS — R0602 Shortness of breath: Secondary | ICD-10-CM

## 2020-10-27 DIAGNOSIS — M1990 Unspecified osteoarthritis, unspecified site: Secondary | ICD-10-CM | POA: Insufficient documentation

## 2020-10-27 DIAGNOSIS — N1832 Chronic kidney disease, stage 3b: Secondary | ICD-10-CM | POA: Insufficient documentation

## 2020-10-27 DIAGNOSIS — I739 Peripheral vascular disease, unspecified: Secondary | ICD-10-CM | POA: Diagnosis not present

## 2020-10-27 DIAGNOSIS — N189 Chronic kidney disease, unspecified: Secondary | ICD-10-CM

## 2020-10-27 DIAGNOSIS — M259 Joint disorder, unspecified: Secondary | ICD-10-CM | POA: Insufficient documentation

## 2020-10-27 DIAGNOSIS — I999 Unspecified disorder of circulatory system: Secondary | ICD-10-CM

## 2020-10-27 DIAGNOSIS — L97524 Non-pressure chronic ulcer of other part of left foot with necrosis of bone: Secondary | ICD-10-CM

## 2020-10-27 DIAGNOSIS — N184 Chronic kidney disease, stage 4 (severe): Secondary | ICD-10-CM | POA: Insufficient documentation

## 2020-10-27 DIAGNOSIS — I219 Acute myocardial infarction, unspecified: Secondary | ICD-10-CM | POA: Insufficient documentation

## 2020-10-27 DIAGNOSIS — I639 Cerebral infarction, unspecified: Secondary | ICD-10-CM

## 2020-10-27 HISTORY — DX: Unspecified osteoarthritis, unspecified site: M19.90

## 2020-10-27 HISTORY — DX: Cerebral infarction, unspecified: I63.9

## 2020-10-27 HISTORY — DX: Shortness of breath: R06.02

## 2020-10-27 HISTORY — DX: Chronic kidney disease, unspecified: N18.9

## 2020-10-27 MED ORDER — DOXYCYCLINE HYCLATE 100 MG PO TABS: 100.0000 mg | ORAL_TABLET | Freq: Two times a day (BID) | ORAL | 0 refills | Status: DC

## 2020-10-30 ENCOUNTER — Other Ambulatory Visit: Payer: Self-pay | Admitting: Interventional Radiology

## 2020-10-31 ENCOUNTER — Encounter: Payer: Self-pay | Admitting: Podiatry

## 2020-10-31 NOTE — Progress Notes (Signed)
Subjective:  Patient ID: Connor Burns., adult    DOB: 06/12/1956,  MRN: [id number]  Chief Complaint  Patient presents with  . Wound Check    Pt states improvement: reduction in swelling and redness. Denies fever/chills/nausea/vomiting and states he has been applying wound dressings and monitoring his temperature. Pt states he was not contacted by his pharmacy for his doxycycline.    64 y.o. adult presents for wound care.  Patient presents with left lower extremity toe wound that is probing down to bone.  He states that the wound is doing fine.  The color of the toe is improving slightly.  He has not started taking doxycycline.  We resent the prescription for doxycycline.  He also wanted to discuss about vascular intervention and the reason for it.  He has been doing Betadine wet-to-dry dressing changes for now.  He denies any other acute complaints.  His vitals are stable in clinic   Review of Systems: Negative except as noted in the HPI. Denies N/V/F/Ch.  Past Medical History:  Diagnosis Date  . Atrial fibrillation ([place])   . CAD (coronary artery disease)   . DVT (deep venous thrombosis) ([place])   . Hypertension   . Myocardial infarction ([place])   . Peripheral vascular disease ([place])   . Stroke [facility])     Current Outpatient Medications:  .  aspirin 81 MG chewable tablet, Chew by mouth., Disp: , Rfl:  .  atorvastatin (LIPITOR) 20 MG tablet, Take by mouth., Disp: , Rfl:  .  colchicine 0.6 MG tablet, Take 0.6 mg by mouth 2 (two) times daily., Disp: , Rfl:  .  cyclobenzaprine (FLEXERIL) 5 MG tablet, Take 1 tablet (5 mg total) by mouth at bedtime., Disp: 90 tablet, Rfl: 3 .  diclofenac (VOLTAREN) 75 MG EC tablet, Take 75 mg by mouth 2 (two) times daily., Disp: , Rfl:  .  doxycycline (VIBRA-TABS) 100 MG tablet, Take 1 tablet (100 mg total) by mouth 2 (two) times daily., Disp: 20 tablet, Rfl: 0 .  furosemide (LASIX) 20 MG tablet, TAKE 1 TABLET BY MOUTH AS NEEDED FOR SWELLING, Disp: , Rfl:  .   lisinopril (PRINIVIL,ZESTRIL) 10 MG tablet, Take 10 mg by mouth daily., Disp: , Rfl:  .  oxybutynin (DITROPAN-XL) 10 MG 24 hr tablet, Take by mouth., Disp: , Rfl:  .  rosuvastatin (CRESTOR) 10 MG tablet, Take 1 tablet (10 mg total) by mouth every other day., Disp: 30 tablet, Rfl: 11 .  sildenafil (REVATIO) 20 MG tablet, Take by mouth., Disp: , Rfl:  .  sildenafil (VIAGRA) 100 MG tablet, Take by mouth., Disp: , Rfl:  .  sotalol (BETAPACE) 80 MG tablet, , Disp: , Rfl:  .  SUPREP BOWEL PREP KIT 17.5-3.13-1.6 GM/177ML SOLN, Take by mouth., Disp: , Rfl:  .  XARELTO 20 MG TABS tablet, TAKE 1 TABLET BY MOUTH ONCE DAILY WITH SUPPER, Disp: 30 tablet, Rfl: 6  Social History   Tobacco Use  Smoking Status Never Smoker  Smokeless Tobacco Never Used    Allergies  Allergen Reactions  . Clopidogrel Anaphylaxis and Other (See Comments)    "Wierd, like out of body thing" Out of body experience   . Plavix [Clopidogrel Bisulfate]    Objective:   Vitals:   10/27/20 0928  Temp: 98.4 F (36.9 C)   There is no height or weight on file to calculate BMI. Constitutional Well developed. Well nourished.  Vascular Dorsalis pedis pulses non palpable bilaterally. Posterior tibial pulses non palpable bilaterally. Capillary  refill normal to all digits.  No cyanosis or clubbing noted. Pedal hair growth normal.  Neurologic Normal speech. Oriented to person, place, and time. Protective sensation absent  Dermatologic Wound Location: Left third digit ulceration probing down to bone.  No purulent drainage noted.  No redness noted. Wound Base: Mixed Granular/Fibrotic Peri-wound: Calloused Exudate: Scant/small amount Serosanguinous exudate Wound Measurements: -See below  Orthopedic: No pain to palpation either foot.   Radiographs: 3 views of skeletally mature adult left foot: There is mild osteoarthritic changes noted to the distal phalanx of the left third digit.  No soft tissue emphysema noted. Assessment:    1. Chronic toe ulcer, left, with necrosis of bone (HCC)   2. Vascular abnormality   3. PVD (peripheral vascular disease) ([place])    Plan:  Patient was evaluated and treated and all questions answered.  Ulcer left third digit with probing down to bone -Debridement as below. -Dressed with Betadine wet-to-dry, DSD. -Continue off-loading with surgical shoe.  Surgical shoe was dispensed -Patient is a high risk of amputation of the toe/the foot.  Given that patient has possible poor circulation to the left lower extremity I believe patient will benefit from vascular study and further evaluation with a possible intervention.  He will be scheduled to get ABIs PVRs done and assess the vascular flow to the left lower extremity. -Patient does have osteomyelitis clinically and even radiographically of the distal phalanx of the third digit.  At this time he has no other clinical signs of infection.  For now I would like to assess his vascular flow prior to amputating his third toe to assess if his not be able to heal appropriately.  If there is any other clinical signs of infection such as cellulitis or purulent drainage have asked him to go to the emergency room right away.  Patient states understanding -I will reach out to Dr. Earleen Newport as patient is now amenable to going to angiogram.  After successful revascularization I plan on taking the patient to the operating room next Monday for amputation. -Continue take same doxycycline for skin and soft tissue prophylaxis -I have reviewed the ABIs PVRs with the patient in extensive detail and the decision was made for him to follow-up with Dr. Earleen Newport for revascularization.  Procedure: Excisional Debridement of Wound Tool: Sharp chisel blade/tissue nipper Rationale: Removal of non-viable soft tissue from the wound to promote healing.  Anesthesia: none Pre-Debridement Wound Measurements: 1 cm x 0.5 cm x 0.5 cm  Post-Debridement Wound Measurements: 1.1 cm x 0.6 cm  x 0.5 cm  Type of Debridement: Sharp Excisional Tissue Removed: Non-viable soft tissue Blood loss: Minimal (<50cc) Depth of Debridement: To the bone Technique: Sharp excisional debridement to bleeding, viable wound base.  Wound Progress: The wound appears to be stagnant with clinically probing down to the bone. Site healing conversation 7 Dressing: Dry, sterile, compression dressing. Disposition: Patient tolerated procedure well. Patient to return in 1 week for follow-up.  No follow-ups on file.

## 2020-11-06 ENCOUNTER — Other Ambulatory Visit (HOSPITAL_COMMUNITY): Payer: Self-pay | Admitting: Interventional Radiology

## 2020-11-06 ENCOUNTER — Telehealth (HOSPITAL_COMMUNITY): Payer: Self-pay | Admitting: Radiology

## 2020-11-06 DIAGNOSIS — I70229 Atherosclerosis of native arteries of extremities with rest pain, unspecified extremity: Secondary | ICD-10-CM

## 2020-11-06 NOTE — Telephone Encounter (Signed)
Called pt, no answer and no VM. Trying to schedule patient's leg angiogram and intervention with Dr. Loreta Ave. JM

## 2020-11-10 ENCOUNTER — Other Ambulatory Visit: Payer: Self-pay | Admitting: Student

## 2020-11-10 ENCOUNTER — Other Ambulatory Visit (HOSPITAL_COMMUNITY): Payer: Self-pay | Admitting: Physician Assistant

## 2020-11-13 ENCOUNTER — Other Ambulatory Visit: Payer: Self-pay

## 2020-11-13 ENCOUNTER — Ambulatory Visit (HOSPITAL_COMMUNITY)
Admission: RE | Admit: 2020-11-13 | Discharge: 2020-11-13 | Disposition: A | Discharge status: Home / Self Care | Payer: BC Managed Care – PPO | Source: Ambulatory Visit | Attending: Interventional Radiology | Admitting: Interventional Radiology

## 2020-11-13 DIAGNOSIS — I70229 Atherosclerosis of native arteries of extremities with rest pain, unspecified extremity: Secondary | ICD-10-CM | POA: Insufficient documentation

## 2020-11-13 DIAGNOSIS — Z539 Procedure and treatment not carried out, unspecified reason: Secondary | ICD-10-CM | POA: Insufficient documentation

## 2020-11-13 LAB — BASIC METABOLIC PANEL
Anion gap: 11 (ref 5–15)
BUN: 19 mg/dL (ref 8–23)
CO2: 26 mmol/L (ref 22–32)
Calcium: 9.4 mg/dL (ref 8.9–10.3)
Chloride: 103 mmol/L (ref 98–111)
Creatinine, Ser: 2.04 mg/dL — ABNORMAL HIGH (ref 0.61–1.24)
GFR, Estimated: 36 mL/min — ABNORMAL LOW (ref >60)
Glucose, Bld: 79 mg/dL (ref 70–99)
Potassium: 4.3 mmol/L (ref 3.5–5.1)
Sodium: 140 mmol/L (ref 135–145)

## 2020-11-13 LAB — CBC
HCT: 48.5 % (ref 39.0–52.0)
Hemoglobin: 16 g/dL (ref 13.0–17.0)
MCH: 29 pg (ref 26.0–34.0)
MCHC: 33 g/dL (ref 30.0–36.0)
MCV: 87.9 fL (ref 80.0–100.0)
Platelets: 130 10*3/uL — ABNORMAL LOW (ref 150–400)
RBC: 5.52 MIL/uL (ref 4.22–5.81)
RDW: 14 % (ref 11.5–15.5)
WBC: 7.6 10*3/uL (ref 4.0–10.5)
nRBC: 0 % (ref 0.0–0.2)

## 2020-11-13 MED ORDER — SODIUM CHLORIDE 0.9 % IV SOLN: INTRAVENOUS | Status: DC

## 2020-11-13 NOTE — Progress Notes (Signed)
    Pt was scheduled today for left leg arteriogram with possible arterial revascularization  He comes in today-- anxious about procedure and feeling "not well informed". After many questions and discussion of procedure- he has decided against procedure today.  Possibly contributing to his decision is fact he had some insurance papers with him ---  I relayed to him likely will need his referring MD to fill out and send in for him. He tells me he is feeling uncomfortable with moving forward and wants to abort procedure.  I offered to take Ins papers to our scheduler for evaluation and decision about them-- he said no I offered to have Dr Loreta Ave come speak to him asap-- he said no.  IV was removed by RN Pt dressed and left

## 2020-12-04 ENCOUNTER — Other Ambulatory Visit: Payer: Self-pay | Admitting: Physician Assistant

## 2020-12-04 DIAGNOSIS — Z1211 Encounter for screening for malignant neoplasm of colon: Secondary | ICD-10-CM

## 2020-12-16 IMAGING — US US CAROTID DUPLEX BILAT
1 series · 13 of 24 positions shown · non-contrast
Comparison: None.

CLINICAL DATA: Recent changes in vision. History of PI AD. History
of CAD (post CABG)

EXAM:
BILATERAL CAROTID DUPLEX ULTRASOUND
TECHNIQUE: Gray scale imaging, color Doppler and duplex ultrasound were
performed of bilateral carotid and vertebral arteries in the neck.

[Series 1: us carotid duplex bilat · 0.07mm/px · 13 of 61 slices shown]
[im 1/61]
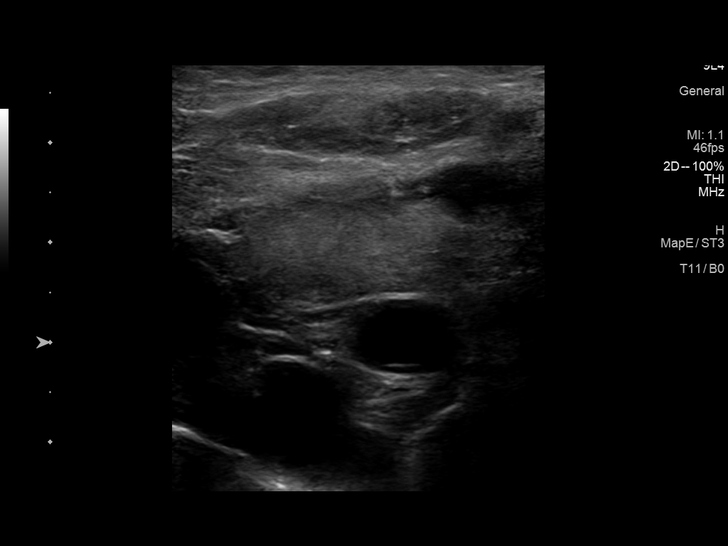
[im 6/61]
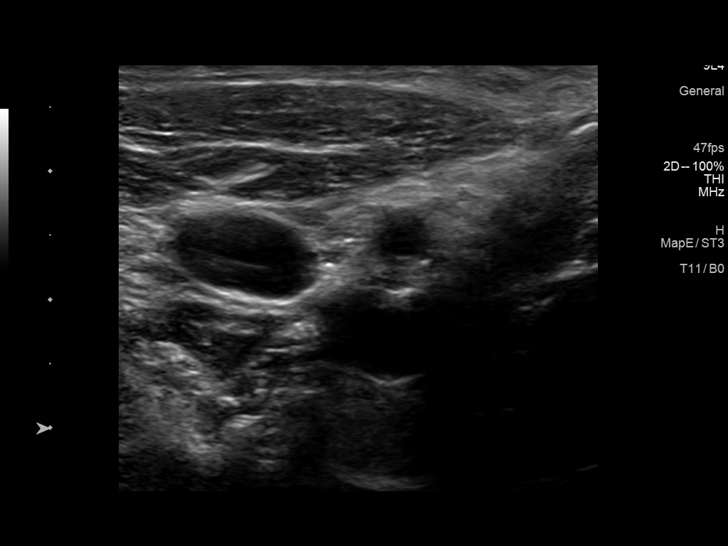
[im 11/61]
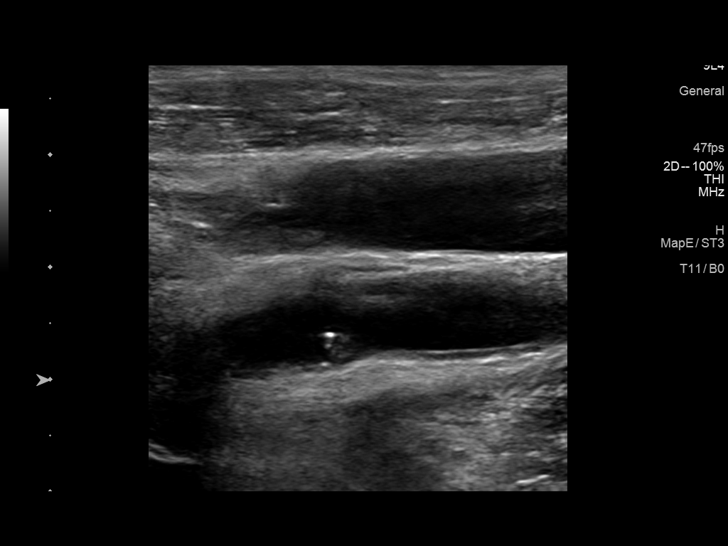
[im 16/61]
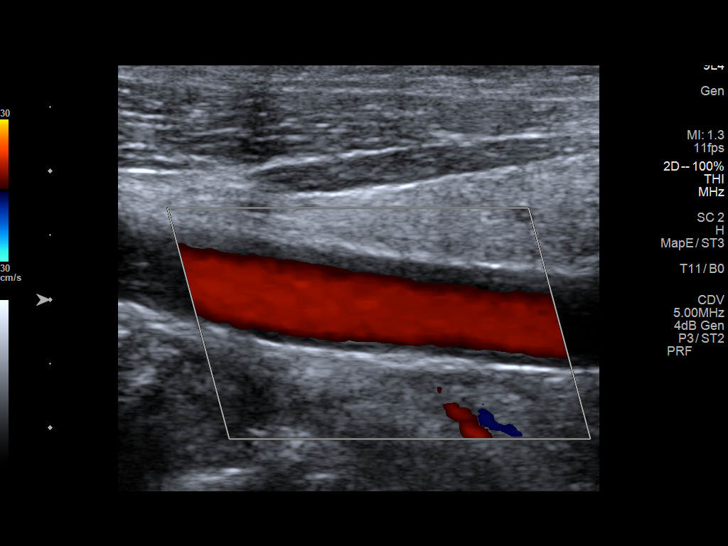
[im 21/61]
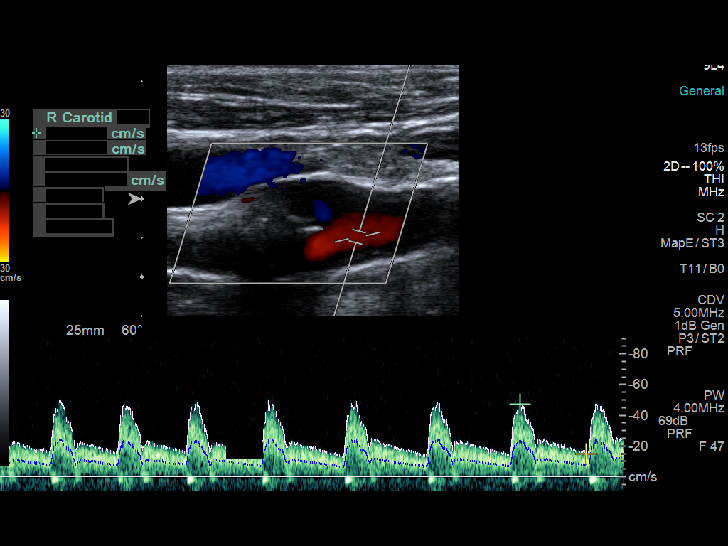
[im 27/61]
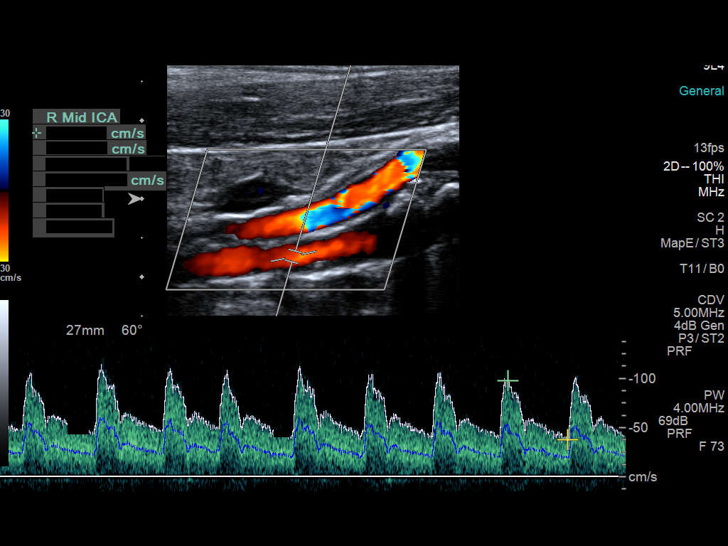
[im 32/61]
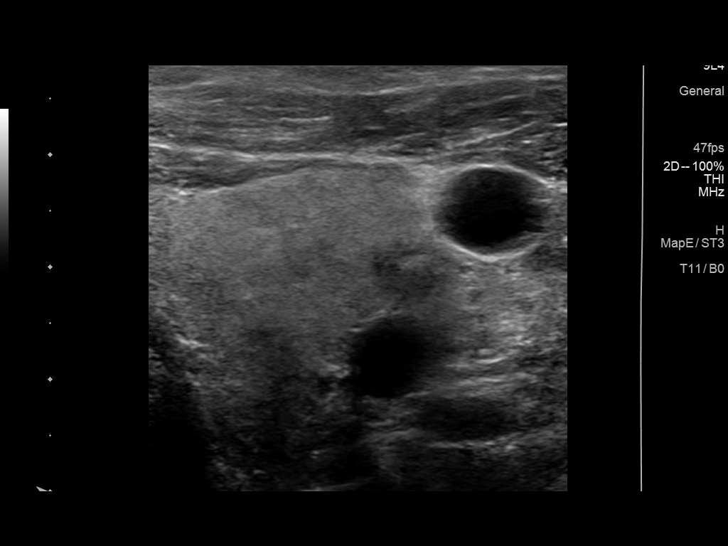
[im 34/61]
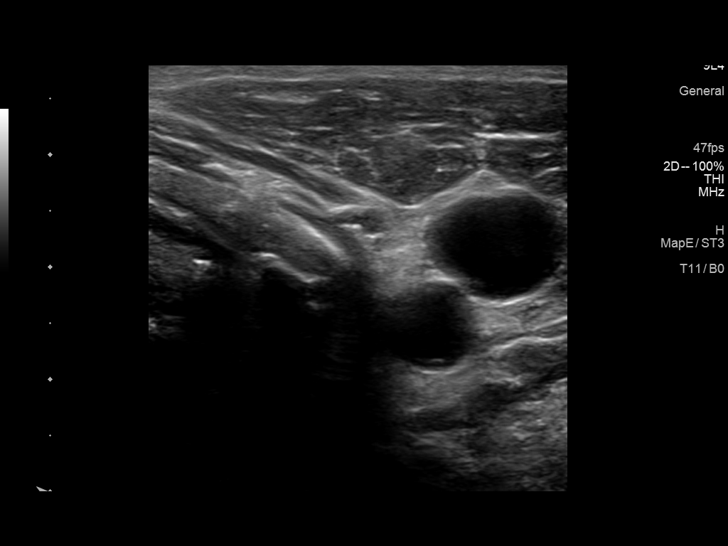
[im 40/61]
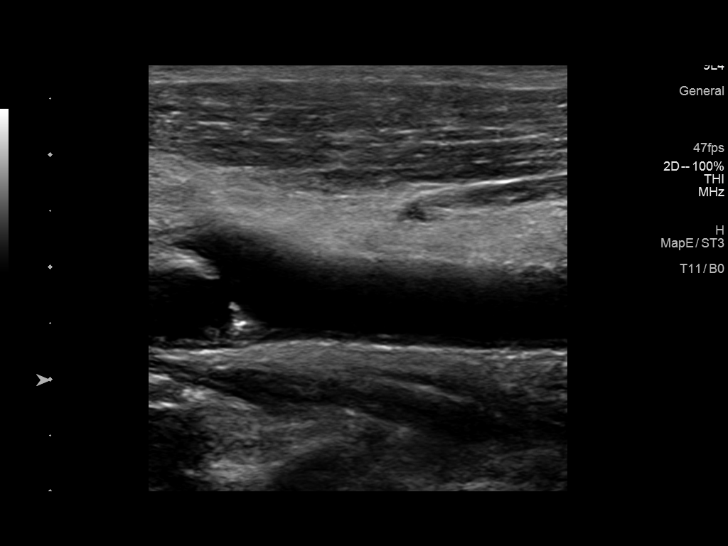
[im 45/61]
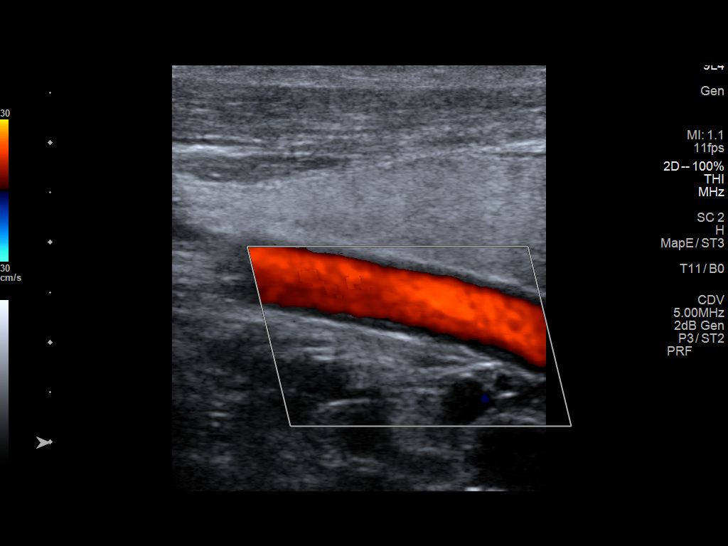
[im 50/61]
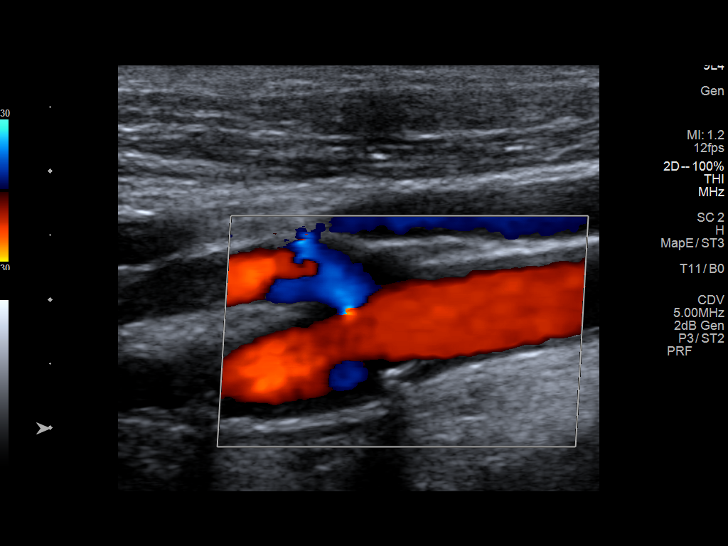
[im 55/61]
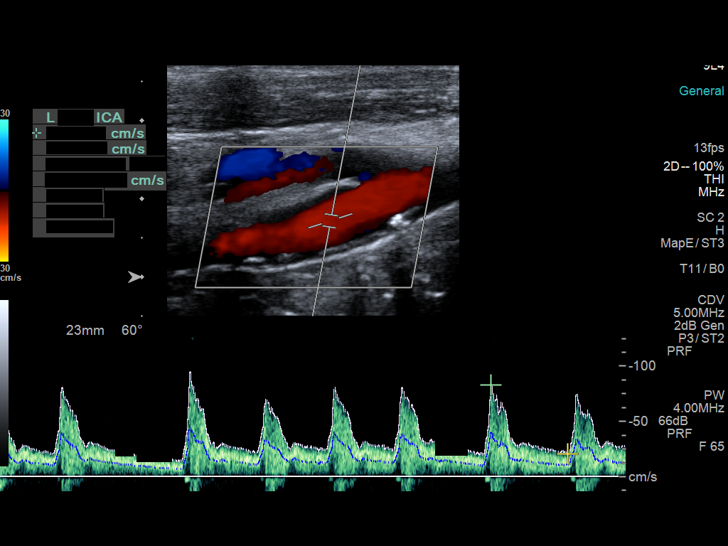
[im 61/61]
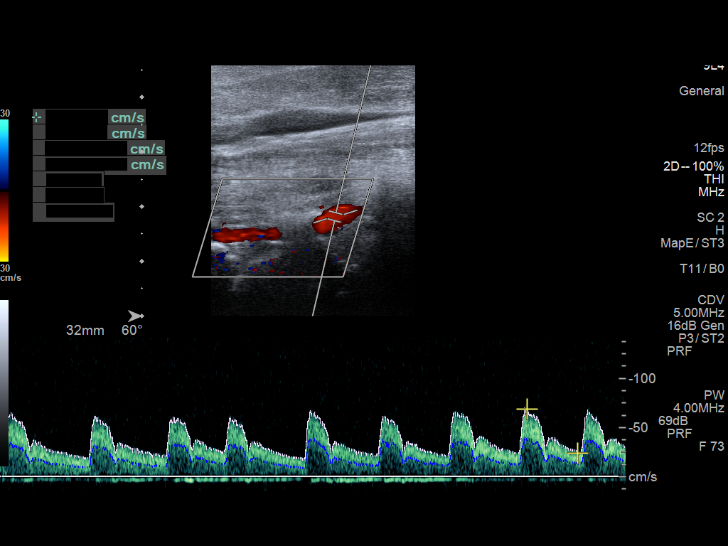

[13 of 24 positions shown; findings below may reference images not displayed]

FINDINGS: Criteria: Quantification of carotid stenosis is based on velocity
parameters that correlate the residual internal carotid diameter
with NASCET-based stenosis levels, using the diameter of the distal
internal carotid lumen as the denominator for stenosis measurement.

The following velocity measurements were obtained:

RIGHT

ICA: 98/38 cm/sec

CCA: 85/19 cm/sec

SYSTOLIC ICA/CCA RATIO:

ECA: 164 cm/sec

LEFT

ICA: 98/35 cm/sec

CCA: 109/27 cm/sec

SYSTOLIC ICA/CCA RATIO:

ECA: 84 cm/sec

RIGHT CAROTID ARTERY: There is a minimal to moderate amount of
eccentric echogenic plaque within the right carotid bulb (image 12).
There is a minimal amount of eccentric echogenic partially shadowing
plaque involving the origin and proximal aspects of the right
internal carotid artery (image 27), not resulting in elevated peak
systolic velocities within the interrogated course of the right
internal carotid artery to suggest a hemodynamically significant
stenosis.

RIGHT VERTEBRAL ARTERY:  Antegrade flow

LEFT CAROTID ARTERY: There is a minimal to moderate amount of
eccentric echogenic plaque within the left carotid bulb (image 42),
extending to involve the origin and proximal aspects of the left
internal carotid artery (image 45), not resulting in elevated peak
systolic velocities within the interrogated course of the left
internal carotid artery to suggest a hemodynamically significant
stenosis.

LEFT VERTEBRAL ARTERY:  Antegrade flow

Upper extremity blood pressures: RIGHT: 136/67 LEFT: 131/67
IMPRESSION: Minimal to moderate amount of bilateral atherosclerotic plaque,
right subjectively greater than left, not resulting in a
hemodynamically significant stenosis within either internal carotid
artery.

## 2021-01-02 ENCOUNTER — Inpatient Hospital Stay: Admission: RE | Admit: 2021-01-02 | Payer: BC Managed Care – PPO | Source: Ambulatory Visit

## 2021-01-19 ENCOUNTER — Inpatient Hospital Stay: Admission: RE | Admit: 2021-01-19 | Payer: Self-pay | Source: Ambulatory Visit

## 2021-02-08 ENCOUNTER — Inpatient Hospital Stay: Admission: RE | Admit: 2021-02-08 | Payer: Self-pay | Source: Ambulatory Visit

## 2021-08-22 ENCOUNTER — Other Ambulatory Visit: Payer: Self-pay | Admitting: Family Medicine

## 2021-08-22 DIAGNOSIS — Z8673 Personal history of transient ischemic attack (TIA), and cerebral infarction without residual deficits: Secondary | ICD-10-CM

## 2021-08-22 DIAGNOSIS — R2689 Other abnormalities of gait and mobility: Secondary | ICD-10-CM

## 2021-08-24 LAB — HM COLONOSCOPY

## 2022-03-18 ENCOUNTER — Encounter: Payer: Self-pay | Admitting: Physician Assistant

## 2022-03-18 ENCOUNTER — Ambulatory Visit (INDEPENDENT_AMBULATORY_CARE_PROVIDER_SITE_OTHER): Payer: BC Managed Care – PPO | Admitting: Physician Assistant

## 2022-03-18 ENCOUNTER — Other Ambulatory Visit: Payer: Self-pay

## 2022-03-18 VITALS — BP 150/90 | HR 80 | Resp 18 | Ht 74.0 in | Wt 236.0 lb

## 2022-03-18 DIAGNOSIS — G3184 Mild cognitive impairment, so stated: Secondary | ICD-10-CM | POA: Diagnosis not present

## 2022-03-18 HISTORY — DX: Mild cognitive impairment of uncertain or unknown etiology: G31.84

## 2022-03-18 NOTE — Patient Instructions (Addendum)
It was a pleasure to see you today at our office.  ? ?Recommendations: ? ?Neurocognitive evaluation at our office ?MRI of the brain   When ok with cardiology ?Follow up after neurocognitive testing ? ?RECOMMENDATIONS FOR ALL PATIENTS WITH MEMORY PROBLEMS: ?1. Continue to exercise (Recommend 30 minutes of walking everyday, or 3 hours every week) ?2. Increase social interactions - continue going to [place] and enjoy social gatherings with friends and family ?3. Eat healthy, avoid fried foods and eat more fruits and vegetables ?4. Maintain adequate blood pressure, blood sugar, and blood cholesterol level. Reducing the risk of stroke and cardiovascular disease also helps promoting better memory. ?5. Avoid stressful situations. Live a simple life and avoid aggravations. Organize your time and prepare for the next day in anticipation. ?6. Sleep well, avoid any interruptions of sleep and avoid any distractions in the bedroom that may interfere with adequate sleep quality ?7. Avoid sugar, avoid sweets as there is a strong link between excessive sugar intake, diabetes, and cognitive impairment ?We discussed the Mediterranean diet, which has been shown to help patients reduce the risk of progressive memory disorders and reduces cardiovascular risk. This includes eating fish, eat fruits and green leafy vegetables, nuts like almonds and hazelnuts, walnuts, and also use olive oil. Avoid fast foods and fried foods as much as possible. Avoid sweets and sugar as sugar use has been linked to worsening of memory function. ? ?There is always a concern of gradual progression of memory problems. If this is the case, then we may need to adjust level of care according to patient needs. Support, both to the patient and caregiver, should then be put into place.  ? ? ? ? ?You have been referred for a neuropsychological evaluation (i.e., evaluation of memory and thinking abilities). Please bring someone with you to this appointment if  possible, as it is helpful for the doctor to hear from both you and another adult who knows you well. Please bring eyeglasses and hearing aids if you wear them.  ?  ?The evaluation will take approximately 3 hours and has two parts: ?  ?The first part is a clinical interview with the neuropsychologist (Dr. Milbert Coulter or Dr. Roseanne Reno). During the interview, the neuropsychologist will speak with you and the individual you brought to the appointment.  ?  ?The second part of the evaluation is testing with the doctor's technician Annabelle Harman or Selena Batten). During the testing, the technician will ask you to remember different types of material, solve problems, and answer some questionnaires. Your family member will not be present for this portion of the evaluation. ?  ?Please note: We must reserve several hours of the neuropsychologist's time and the psychometrician's time for your evaluation appointment. As such, there is a No-Show fee of $100. If you are unable to attend any of your appointments, please contact our office as soon as possible to reschedule.  ? ? ?FALL PRECAUTIONS: Be cautious when walking. Scan the area for obstacles that may increase the risk of trips and falls. When getting up in the mornings, sit up at the edge of the bed for a few minutes before getting out of bed. Consider elevating the bed at the head end to avoid drop of blood pressure when getting up. Walk always in a well-lit room (use night lights in the walls). Avoid area rugs or power cords from appliances in the middle of the walkways. Use a walker or a cane if necessary and consider physical therapy for balance exercise. Get  your eyesight checked regularly. ? ?FINANCIAL OVERSIGHT: Supervision, especially oversight when making financial decisions or transactions is also recommended. ? ?HOME SAFETY: Consider the safety of the kitchen when operating appliances like stoves, microwave oven, and blender. Consider having supervision and share cooking responsibilities  until no longer able to participate in those. Accidents with firearms and other hazards in the house should be identified and addressed as well. ? ? ?ABILITY TO BE LEFT ALONE: If patient is unable to contact 911 operator, consider using LifeLine, or when the need is there, arrange for someone to stay with patients. Smoking is a fire hazard, consider supervision or cessation. Risk of wandering should be assessed by caregiver and if detected at any point, supervision and safe proof recommendations should be instituted. ? ?MEDICATION SUPERVISION: Inability to self-administer medication needs to be constantly addressed. Implement a mechanism to ensure safe administration of the medications. ? ? ?DRIVING: Regarding driving, in patients with progressive memory problems, driving will be impaired. We advise to have someone else do the driving if trouble finding directions or if minor accidents are reported. Independent driving assessment is available to determine safety of driving. ? ? ?If you are interested in the driving assessment, you can contact the following: ? ?[facility] [employer] [facility] in [place] [phone] ? ?[facility] [phone] ? ?[facility] [phone] ? ?[facility] [phone] or [phone] ? ? ? ?Mediterranean Diet ?A Mediterranean diet refers to food and lifestyle choices that are based on the traditions of countries located on the [employer] [place]. This way of eating has been shown to help prevent certain conditions and improve outcomes for people who have chronic diseases, like kidney disease and heart disease. ?What are tips for following this plan? ?Lifestyle  ?Cook and eat meals together with your family, when possible. ?Drink enough fluid to keep your urine clear or pale yellow. ?Be physically active every day. This includes: ?Aerobic exercise like running or swimming. ?Leisure activities like gardening, walking, or housework. ?Get 7-8 hours of sleep each  night. ?If recommended by your health care provider, drink red wine in moderation. This means 1 glass a day for nonpregnant women and 2 glasses a day for men. A glass of wine equals 5 oz (150 mL). ?Reading food labels  ?Check the serving size of packaged foods. For foods such as rice and pasta, the serving size refers to the amount of cooked product, not dry. ?Check the total fat in packaged foods. Avoid foods that have saturated fat or trans fats. ?Check the ingredients list for added sugars, such as corn syrup. ?Shopping  ?At the grocery store, buy most of your food from the areas near the walls of the store. This includes: ?Fresh fruits and vegetables (produce). ?Grains, beans, nuts, and seeds. Some of these may be available in unpackaged forms or large amounts (in bulk). ?Fresh seafood. ?Poultry and eggs. ?Low-fat dairy products. ?Buy whole ingredients instead of prepackaged foods. ?Buy fresh fruits and vegetables in-season from local farmers markets. ?Buy frozen fruits and vegetables in resealable bags. ?If you do not have access to quality fresh seafood, buy precooked frozen shrimp or canned fish, such as tuna, salmon, or sardines. ?Buy small amounts of raw or cooked vegetables, salads, or olives from the deli or salad bar at your store. ?Stock your pantry so you always have certain foods on hand, such as olive oil, canned tuna, canned tomatoes, rice, pasta, and beans. ?Cooking  ?Cook foods with extra-virgin olive oil instead of using butter or  other vegetable oils. ?Have meat as a side dish, and have vegetables or grains as your main dish. This means having meat in small portions or adding small amounts of meat to foods like pasta or stew. ?Use beans or vegetables instead of meat in common dishes like chili or lasagna. ?Experiment with different cooking methods. Try roasting or broiling vegetables instead of steaming or saut?eing them. ?Add frozen vegetables to soups, stews, pasta, or rice. ?Add nuts or seeds  for added healthy fat at each meal. You can add these to yogurt, salads, or vegetable dishes. ?Marinate fish or vegetables using olive oil, lemon juice, garlic, and fresh herbs. ?Meal planning  ?Plan to ea

## 2022-03-18 NOTE — Progress Notes (Signed)
? ? ?Assessment/Plan:  ? ?Connor Burns. is a very pleasant 66 y.o. year old RH male with  a history of hypertension, hyperlipidemia, PVD, Afib on Xarelto, s/p ICD, congestive cardiomyopathy, history of stroke 2006, arthritis, CKD3, gout, peripheral neuropathy, seen today for evaluation of memory loss. MoCA today is 24/30  with delayed recall 4/5 . Workup is in progress ? ? ?Mild cognitive impairment, likely vascular ? ?MRI brain with/without contrast to assess for underlying structural abnormality and assess vascular load if okay with cardiology, the patient has an ICD.  Otherwise, we will proceed with CT of the head ?Neurocognitive testing to further evaluate cognitive concerns and determine other underlying cause of memory changes, including potential contribution from sleep, anxiety, or depression  ?Discussed safety both in and out of the home.  ?Discussed the importance of regular daily schedule with inclusion of crossword puzzles to maintain brain function.  ?Continue to monitor mood with PCP.  ?Replenish B12  ?Stay active at least 30 minutes at least 3 times a week.  ?Naps should be scheduled and should be no longer than 60 minutes and should not occur after 2 PM.  ?Control cardiovascular risk factors  ?Mediterranean diet is recommended  ?Folllow up after neurocognitive testing ? ?Subjective:  ? ? ?The patient is seen in neurologic consultation at the request of [name], [name], MD for the evaluation of memory.  The patient is accompanied by his daughter who supplements the history. ?This is a 66 y.o. year old RH  male who has had memory issues for about 2 years, initially noticed by his family.  He is good at "it was not bad, but since January it got worse ".  Since that time, he is repeating himself more, but attributes this as multitasking, "I may become more distracted than I forget ".  He denies being disoriented when walking into her room or leaving objects in unusual places.  He ambulates without  difficulty, denies any falls or head injuries.  He works out at least 2 times a week.  He continues to drive, and denies getting lost.  He lives with his wife and his youngest daughter.  His mood is good, he has moments in which she is more irritable "my father is a very passionate person, and he gets angry if he gets bothered ".  "He is not as bad as he used to be ".  He denies any history of depression or anxiety, hallucinations or paranoia.  He sleeps fairly well, denies vivid dreams or sleepwalking or REM behavior.  There are no hygiene concerns, he is independent of bathing and dressing.  Sometimes he forgets his medications.  There are no issues with his finances.  "I am on top of it ".  His appetite is not great according to him, he is trying to control his sugar intake to maintain a better weight.  He denies any trouble swallowing.  He does not cook.  He denies any headaches,double vision, dizziness, no new focal numbness or tingling, unilateral weakness, tremors or anosmia. No history of seizures. Denies urine incontinence, retention, constipation or diarrhea.  Denies OSA, ETOH or Tobacco. Family History remarkable for maternal grandmother and sister "with some sort of dementia " ? ?Recent B12 273, TSH was 1.5 ? ?Allergies  ?Allergen Reactions  ? Clopidogrel Anaphylaxis and Other (See Comments)  ?  "Wierd, like out of body thing" ?Out of body experience   ? Plavix [Clopidogrel Bisulfate]   ? ? ?Current Outpatient Medications  ?  Medication Instructions  ? aspirin EC 81 mg, Oral, Daily at bedtime, Swallow whole.   ? atorvastatin (LIPITOR) 20 MG tablet Take by mouth.  ? colchicine 0.6 mg, 2 times daily  ? cyclobenzaprine (FLEXERIL) 5 mg, Oral, Daily at bedtime  ? diclofenac (VOLTAREN) 75 mg, Oral, 2 times daily  ? doxycycline (VIBRA-TABS) 100 mg, Oral, 2 times daily  ? furosemide (LASIX) 20 MG tablet TAKE 1 TABLET BY MOUTH AS NEEDED FOR SWELLING  ? lisinopril (ZESTRIL) 10 mg, Oral, Daily  ? oxybutynin  (DITROPAN-XL) 10 MG 24 hr tablet Oral  ? rosuvastatin (CRESTOR) 10 mg, Oral, Every other day  ? sotalol (BETAPACE) 80 mg, Every evening  ? SUPREP BOWEL PREP KIT 17.5-3.13-1.6 GM/177ML SOLN Take by mouth.  ? XARELTO 20 MG TABS tablet TAKE 1 TABLET BY MOUTH ONCE DAILY WITH SUPPER  ? ? ? ?VITALS:   ?Vitals:  ? 03/18/22 1000  ?BP: (!) 150/90  ?Pulse: 80  ?Resp: 18  ?SpO2: 95%  ?Weight: 236 lb (107 kg)  ?Height: 6' 2"  (1.88 m)  ? ?   ? View : No data to display.  ?  ?  ?  ? ? ?PHYSICAL EXAM  ? ?HEENT:  Normocephalic, atraumatic. The mucous membranes are moist. The superficial temporal arteries are without ropiness or tenderness. ?Cardiovascular: Regular rate and rhythm. ?Lungs: Clear to auscultation bilaterally. ?Neck: There are no carotid bruits noted bilaterally. ? ?NEUROLOGICAL: ? ?  03/18/2022  ?  3:00 PM  ?Montreal Cognitive Assessment   ?Visuospatial/ Executive (0/5) 3  ?Naming (0/3) 3  ?Attention: Read list of digits (0/2) 2  ?Attention: Read list of letters (0/1) 1  ?Attention: Serial 7 subtraction starting at 100 (0/3) 3  ?Language: Repeat phrase (0/2) 1  ?Language : Fluency (0/1) 1  ?Abstraction (0/2) 1  ?Delayed Recall (0/5) 3  ?Orientation (0/6) 6  ?Total 24  ?Adjusted Score (based on education) 24  ?  ?   ? View : No data to display.  ?  ?  ?  ?  ? ?Orientation:  Alert and oriented to person, place and time. No aphasia or dysarthria. Fund of knowledge is appropriate. Recent memory impaired and remote memory intact.  Attention and concentration are normal.  Able to name objects and repeat phrases. Delayed recall   ?Cranial nerves: There is good facial symmetry. Extraocular muscles are intact and visual fields are full to confrontational testing. Speech is fluent and clear. Soft palate rises symmetrically and there is no tongue deviation. Hearing is intact to conversational tone. ?Tone: Tone is good throughout. ?Sensation: Sensation is intact to light touch and pinprick throughout. Vibration is intact at the  bilateral big toe. There is no extinction with double simultaneous stimulation. There is no sensory dermatomal level identified. ?Coordination: The patient has no difficulty with RAM's or FNF bilaterally. Normal finger to nose  ?Motor: Strength is 5/5 in the bilateral upper and lower extremities. There is no pronator drift. There are no fasciculations noted. ?DTR's: Deep tendon reflexes are 2/4 at the bilateral biceps, triceps, brachioradialis, patella and achilles.  Plantar responses are downgoing bilaterally. ?Gait and Station: The patient is able to ambulate without difficulty.The patient is able to heel toe walk without any difficulty.The patient is able to ambulate in a tandem fashion. The patient is able to stand in the Romberg position. ?  ? ? ?Thank you for allowing [place] the opportunity to participate in the care of this nice patient. Please do not hesitate to contact us for  any questions or concerns.  ? ?Total time spent on today's visit was 60 minutes, including both face-to-face time and nonface-to-face time.  Time included that spent on review of records (prior notes available to me/labs/imaging if pertinent), discussing treatment and goals, answering patient's questions and coordinating care. ? ?Cc:  [name], [name], MD ? ?Sharene Butters ?03/18/2022 3:30 PM   ?

## 2022-10-18 ENCOUNTER — Ambulatory Visit
Admission: RE | Admit: 2022-10-18 | Discharge: 2022-10-18 | Disposition: A | Discharge status: Home / Self Care | Payer: BC Managed Care – PPO | Source: Ambulatory Visit | Attending: Family Medicine | Admitting: Family Medicine

## 2022-10-18 ENCOUNTER — Other Ambulatory Visit: Payer: Self-pay | Admitting: Family Medicine

## 2022-10-18 DIAGNOSIS — R0602 Shortness of breath: Secondary | ICD-10-CM

## 2022-10-25 ENCOUNTER — Other Ambulatory Visit: Payer: Self-pay | Admitting: Nephrology

## 2022-10-25 DIAGNOSIS — D631 Anemia in chronic kidney disease: Secondary | ICD-10-CM

## 2022-10-25 DIAGNOSIS — N1832 Chronic kidney disease, stage 3b: Secondary | ICD-10-CM

## 2022-10-25 DIAGNOSIS — I129 Hypertensive chronic kidney disease with stage 1 through stage 4 chronic kidney disease, or unspecified chronic kidney disease: Secondary | ICD-10-CM

## 2022-10-25 DIAGNOSIS — N2581 Secondary hyperparathyroidism of renal origin: Secondary | ICD-10-CM

## 2022-11-04 ENCOUNTER — Inpatient Hospital Stay: Admission: RE | Admit: 2022-11-04 | Payer: BC Managed Care – PPO | Source: Ambulatory Visit

## 2022-11-28 ENCOUNTER — Encounter: Payer: Self-pay | Admitting: Psychology

## 2022-11-28 DIAGNOSIS — I82409 Acute embolism and thrombosis of unspecified deep veins of unspecified lower extremity: Secondary | ICD-10-CM | POA: Insufficient documentation

## 2022-11-29 ENCOUNTER — Encounter: Payer: BC Managed Care – PPO | Admitting: Psychology

## 2022-11-29 ENCOUNTER — Encounter: Payer: Self-pay | Admitting: Psychology

## 2022-11-29 DIAGNOSIS — Z029 Encounter for administrative examinations, unspecified: Secondary | ICD-10-CM

## 2022-12-05 ENCOUNTER — Encounter: Payer: BC Managed Care – PPO | Admitting: Psychology

## 2022-12-09 ENCOUNTER — Ambulatory Visit: Payer: BC Managed Care – PPO | Admitting: Physician Assistant

## 2022-12-30 ENCOUNTER — Ambulatory Visit: Payer: BC Managed Care – PPO

## 2022-12-30 ENCOUNTER — Encounter: Payer: Self-pay | Admitting: Psychology

## 2022-12-30 ENCOUNTER — Ambulatory Visit: Payer: BC Managed Care – PPO | Admitting: Psychology

## 2022-12-30 DIAGNOSIS — I639 Cerebral infarction, unspecified: Secondary | ICD-10-CM

## 2022-12-30 DIAGNOSIS — F03A0 Unspecified dementia, mild, without behavioral disturbance, psychotic disturbance, mood disturbance, and anxiety: Secondary | ICD-10-CM | POA: Diagnosis not present

## 2022-12-30 DIAGNOSIS — R4189 Other symptoms and signs involving cognitive functions and awareness: Secondary | ICD-10-CM

## 2022-12-30 HISTORY — DX: Unspecified dementia, mild, without behavioral disturbance, psychotic disturbance, mood disturbance, and anxiety: F03.A0

## 2022-12-30 NOTE — Progress Notes (Signed)
NEUROPSYCHOLOGICAL EVALUATION . [facility] Department of Neurology  Date of Evaluation: December 30, 2022  Reason for Referral:   Basheer Molchan. is a 67 y.o. right-handed African-American male referred by Marlowe Kays, PA-C, to characterize his current cognitive functioning and assist with diagnostic clarity and treatment planning in the context of subjective cognitive decline and a remote right frontal infarct.   Assessment and Plan:   Clinical Impression(s): Scores across stand-alone and embedded performance validity measures were variable. Below expectation performances were very near clinical cut-offs and these scores likely represent true cognitive dysfunction rather than poor engagement. While the results of the current evaluation are believed to be a largely valid representation of Mr. Beltran current cognitive functioning, mild caution is likely prudent.  If taken at face value, Mr. Albor's pattern of performance is suggestive of ongoing impairment surrounding executive functioning, confrontation naming, visuospatial abilities (outside of clock drawing), and all aspects of verbal learning and memory. While receptive language also represented a normative impairment, he missed very few items across this task, suggesting that this might represent more of a normative impairment than a clinical impairment. An additional weakness was exhibited across attention/concentration while performance variability was exhibited across processing speed. Performances were appropriate relative to age-matched peers across verbal fluency and visual learning and memory. Functionally, Mr. Lieurance acknowledged some trouble remembering to take his medications and that his wife has become more involved in financial management and bill paying responsibilities. 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 However, he did acknowledge that work supervisors have also  expressed concern surrounding cognitive functioning. Overall, he is likely on the border between mild and major neurocognitive disorder diagnostic classifications. Based upon current cognitive impairment, suspected decline given premorbid intellectual estimations (i.e., Master's degree), and evolving functional concerns, I feel he best meets diagnostic criteria for a Major Neurocognitive Disorder ("dementia"). However, he would appear more towards the mild end of this spectrum presently.   The etiology of his mild dementia presentation is unclear as current testing patterns are fairly nonspecific in nature. His most recent neuroimaging which I was able to review is in the form of a head CT dated to 2013. 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 Given that no more recent scans could be located, I am unable to comment on any additional events over the past 10-11 years or the severity of underlying cerebrovascular disease which could be playing an active role in his current presentation.   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 Across these tasks, he did not benefit from repeated exposure to new information, was largely amnestic after brief delays, and performed poorly across yes/no recognition trials. This suggests concerns for rapid forgetting and a significant memory storage impairment. While this could raise concerns for the presence of a neurodegenerative illness such as Alzheimer's disease (especially given additional impairment surrounding confrontation naming), visual memory remained very strong. While this is both unexpected and encouraging, continued monitoring will be important to see if cognitive impairments remain stable or if progressive decline over the next few years occurs.   Behaviorally, Mr. Nabor does not display prominent features of Lewy body disease (e.g., parkinsonian symptoms, REM sleep behaviors,  visual hallucinations). While this illness often presents with executive dysfunction, visuospatial impairment, and memory loss, the lack of behavioral features would make this presentation less likely. He also does not display behavioral features of frontotemporal lobar degeneration.  Recommendations:  Updated neuroimaging would be beneficial. His pacemaker might prevent him from completing an MRI. If this is true, an updated head CT would be beneficial given that his last scan is 67 years old.  If desired, Mr. Vella KohlerGullett should talk with Ms. Wertman regarding various medications aimed at addressing memory loss and concerns surrounding cognitive decline. It is important to highlight that these medications have been shown to slow functional decline in some individuals. There is no current treatment which can stop or reverse cognitive decline when caused by a neurodegenerative illness.   A repeat neuropsychological evaluation in 12-18 months (or sooner if functional decline is noted) is recommended to assess the trajectory of future cognitive decline should it occur. This will also aid in future efforts towards improved diagnostic clarity.  A combination of medication and psychotherapy has been shown to be most effective at treating symptoms of anxiety and depression. As such, Mr. Vella KohlerGullett is encouraged to speak with his prescribing physician regarding medication adjustments to optimally manage these symptoms.   Performance across neurocognitive testing is not a strong predictor of an individual's safety operating a motor vehicle. Should his family wish to pursue a formalized driving evaluation, they could reach out to the following agencies: [employer] [facility] [place]: [phone] [facility]: [phone] [facility]: [phone] [facility]: [phone] or [phone]  Should there be a progression of his current deficits over time, he is unlikely to  regain any independent living skills lost. Therefore, it is recommended that he remain as involved as possible in all aspects of household chores, finances, and medication management, with supervision to ensure adequate performance. He will likely benefit from the establishment and maintenance of a routine in order to maximize functional abilities over time.  It will be important for Mr. Vella KohlerGullett to have another person with him when in situations where he may need to process information, weigh the pros and cons of different options, and make decisions, in order to ensure that he fully understands and recalls all information to be considered.  Mr. Vella KohlerGullett is encouraged to attend to lifestyle factors for brain health (e.g., regular physical exercise, good nutrition habits, regular participation in cognitively-stimulating activities, and general stress management techniques), which are likely to have benefits for both emotional adjustment and cognition. In fact, in addition to promoting good general health, regular exercise incorporating aerobic activities (e.g., brisk walking, jogging, cycling, etc.) has been demonstrated to be a very effective treatment for depression and stress, with similar efficacy rates to both antidepressant medication and psychotherapy. Optimal control of vascular risk factors (including safe cardiovascular exercise and adherence to dietary recommendations) is encouraged. Continued participation in activities which provide mental stimulation and social interaction is also recommended.   Important information should be provided to Mr. Vella KohlerGullett in written format in all instances. This information should be placed in a highly frequented and easily visible location within his home to promote recall. External strategies such as written notes in a consistently used memory journal, visual and nonverbal auditory cues such as a calendar on the refrigerator or appointments with alarm, such as on a  cell phone, can also help maximize recall.  Review of Records:   Mr. Vella KohlerGullett was seen by [facility] Marlowe Kays(Sara Wertman, [place]) on 03/18/2022 for an evaluation of memory loss. At that time, memory difficulties were said to be present for the prior two years. Examples included generalized forgetfulness and some repetition in conversation. Some of this he attributed to distraction and trouble multi-tasking.  No mood concerns were noted outside some infrequent episodes of irritability. ADLs were described as intact outside of occasionally forgetting to take medications. Performance on a brief cognitive screening instrument (MOCA) was 24/30. Ultimately, Mr. Vella KohlerGullett was referred for a comprehensive neuropsychological evaluation to characterize his cognitive abilities and to assist with diagnostic clarity and treatment planning.   Head CT on 10/23/2012 revealed stable encephalomalacia in the right frontal lobe, suggestive of an old infarct extending from the floor of the anterior cranial fossa to the inferior border of the right frontal horn. This was said to have been stable since at least 2012. No other abnormalities were noted. No more recent neuroimaging was available for review.   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  He is to restart his Xarelto on Sunday night.   CAD (coronary artery disease)    CHF (congestive heart failure) 01/14/2017   Intermittent CHF with lower extremity edema.  I have given him Lasix to take on a p.r.n. basis.Formatting of this note might be different from the original. Last Assessment & Plan: Formatting of this note might  be different from the original. Intermittent CHF with lower extremity edema.  I have given him Las   Chronic kidney disease 10/27/2020   Stage II   Congestive cardiomyopathy 01/14/2017   Cardiomyopathy with LV dysfunction.Last ejection fraction 40-45% by echocardiography in 2012.Patient currently not taking beta-blocker Ace inhibitor for some reason.We will repeat echocardiography to evaluate LV function then determine the need for afterload reduction and beta-blocker therapy.Formattin   Coronary atherosclerosis 01/14/2017   Status remote CABG in 2004 in [place].Left internal mammary artery to the obtuse marginal.Free Right internal mammary artery to the LAD.Saphenous vein graft to OM 2.Saphenous vein graft to the RCA.  Asymptomatic without chest pain or shortness of breath.Last nuclear stress test May 2018:  Large   DVT (deep venous thrombosis)    Dyslipidemia 05/07/2018   Hematuria 08/10/2015   Hypertension    ICD (implantable cardioverter-defibrillator) battery depletion 06/16/2018   Status post dual-chamber ICD generator change 06/16/2018.Last Assessment & Plan: Formatting of this note might be different from the original.Left infraclavicular incision intact with Dermabond.  There is no pocket hematoma present.  No drainage noted.Device interrogation this a.m. reveals appropriate device function.   Ischemic cardiomyopathy 06/18/2016   Echo 09/14/2011 showing LA 4.9cm, EF 40-45%.  Mild to moderate decreased LV systolic function.  Mild concentric LVH.  LA is moderately dilated.  Mild aortic cusp sclerosis. Aortic valve appears to be bicuspid.B. Concerning for potential dysrhythmia (ventricular tachycardia) especially given documented nonsustained ventricular tachy   Mild cognitive impairment 03/18/2022   Myocardial infarction    Obesity (BMI 30-39.9) 08/10/2015   Onychomycosis 05/10/2014   Painful legs and moving toes of left foot 05/10/2014   Paroxysmal atrial fibrillation 08/08/2015   A.  Paroxysmal.B. On Sotalol therapy.Last Assessment & Plan: History of present atrial fibrillation.  Patient will 80 mg twice daily.Also on rivaroxaban 20 mg a day.Patient again refused an ECG.I explained to the patient, that the absence of an EKG, I canno   Peripheral vascular disease    Shortness of breath 10/27/2020   Exertional  Stroke 10/27/2020   Right frontal infarct per neuroimaging dating back at least to 2013   Syncope 01/14/2017   In the setting of ischemic cardiomyopathy. B. Concerning for potential dysrhythmia (ventricular tachycardia) especially given documented nonsustained ventricular tachycardia - EPS 9/25/201 showing reproducibly inducible nonsustained monomorphic VT up to 10 beat.  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 In the setting of ischemic cardiomyopathy. B. Concerning for potential dysrhythmia (ventricular tachycardia) especially given documented nonsustained ventricular tachycardia - EPS 9/25/201 showing reproducibly inducible nonsustained monomorphic VT up to 10 beat.  Reproducibly inducible nonsustained polymorphic VT.  Inducible VF wi    Past Surgical History:  Procedure Laterality Date   CARDIAC DEFIBRILLATOR PLACEMENT     CORONARY ARTERY BYPASS GRAFT  10/2003   IR RADIOLOGIST EVAL & MGMT  10/26/2020   PR VEIN BYPASS GRAFT,AORTO-FEM-POP      Current Outpatient Medications:    aspirin EC 81 MG tablet, Take 81 mg by mouth at bedtime. Swallow whole., Disp: , Rfl:    atorvastatin (LIPITOR) 20 MG tablet, Take by mouth. (Patient not taking: Reported on 03/18/2022), Disp: , Rfl:    colchicine 0.6 MG tablet, Take 0.6 mg by mouth 2 (two) times daily. (Patient not taking: Reported on 03/18/2022), Disp: , Rfl:    cyclobenzaprine (FLEXERIL) 5 MG tablet, Take 1 tablet (5 mg total) by mouth at bedtime., Disp: 90 tablet, Rfl: 3    diclofenac (VOLTAREN) 75 MG EC tablet, Take 75 mg by mouth 2 (two) times daily., Disp: , Rfl:    doxycycline (VIBRA-TABS) 100 MG tablet, Take 1 tablet (100 mg total) by mouth 2 (two) times daily. (Patient not taking: Reported on 03/18/2022), Disp: 20 tablet, Rfl: 0   furosemide (LASIX) 20 MG tablet, TAKE 1 TABLET BY MOUTH AS NEEDED FOR SWELLING, Disp: , Rfl:    lisinopril (PRINIVIL,ZESTRIL) 10 MG tablet, Take 10 mg by mouth daily., Disp: , Rfl:    oxybutynin (DITROPAN-XL) 10 MG 24 hr tablet, Take by mouth., Disp: , Rfl:    rosuvastatin (CRESTOR) 10 MG tablet, Take 1 tablet (10 mg total) by mouth every other day. (Patient not taking: Reported on 03/18/2022), Disp: 30 tablet, Rfl: 11   sotalol (BETAPACE) 80 MG tablet, Take 80 mg by mouth every evening.  (Patient not taking: Reported on 03/18/2022), Disp: , Rfl:    SUPREP BOWEL PREP KIT 17.5-3.13-1.6 GM/177ML SOLN, Take by mouth. (Patient not taking: Reported on 03/18/2022), Disp: , Rfl:    XARELTO 20 MG TABS tablet, TAKE 1 TABLET BY MOUTH ONCE DAILY WITH SUPPER (Patient taking differently: Take 20 mg by mouth at bedtime.), Disp: 30 tablet, Rfl: 6  Clinical Interview:   The following information was obtained during a clinical interview with Mr. Lubeck and his daughter prior to cognitive testing.  Cognitive Symptoms: Decreased short-term memory: Endorsed. He reported difficulties occurring "at times." Examples included misplacing things around his environment and some trouble recalling details of past conversations. His daughter also noted that he will be repetitive in conversation, sometimes asking the same questions several times. Both she and Mr. Kissoon described a gradual decline in memory abilities over the past 6-12 months.  Decreased long-term memory: Denied. Decreased attention/concentration: Denied. Reduced processing speed: Endorsed. Difficulties with executive functions: Endorsed. Lately, he described greater difficulty with multi-tasking and  organization. He and his daughter denied trouble with impulsivity or any significant personality  changes.  Difficulties with emotion regulation: Denied. Difficulties with receptive language: Denied. Difficulties with word finding: Endorsed. Decreased visuoperceptual ability: Denied.  Difficulties completing ADLs: Somewhat. His wife has become far more involved in bill paying and financial management responsibilities, which he noted was a "big change." He will also occasionally forget to take his medications. He continues to drive without reported issue. He denied any instances of getting lost or turned around.   Additional Medical History: History of traumatic brain injury/concussion: Unclear. He played football throughout high school and college and did describe at least a few instances where he was hit and felt dazed and/or disoriented. He did not report any potential head injuries more recent than college.  History of stroke: Endorsed. As stated above, a head CT in 2013 suggested stable evidence of a right frontal infarct dating back at least to 2012 if not earlier. Mr. Dombek reported his perception that he fully recovered from this event without notable residual cognitive dysfunction.  History of seizure activity: Denied. History of known exposure to toxins: Denied. Symptoms of chronic pain: Denied. Experience of frequent headaches/migraines: Denied. Frequent instances of dizziness/vertigo: Denied.  Sensory changes: He wears glasses with some benefit. However, during the past 2-3 months, he described increased blurred vision. He also reported a diminished sense of taste for an unclear period of time. Other sensory changes/difficulties (e.g., hearing, smell) were denied.  Balance/coordination difficulties: Largely denied. He did describe very mild instability surrounding the left side of his body at times. He denied any recent falls.  Other motor difficulties: Denied.  Sleep  History: Estimated hours obtained each night: 5-6 hours.  Difficulties falling asleep: Denied. Difficulties staying asleep: Endorsed at times.  Feels rested and refreshed upon awakening: Endorsed.  History of snoring: Endorsed. History of waking up gasping for air: Denied. Witnessed breath cessation while asleep: Denied.  History of vivid dreaming: Denied. Excessive movement while asleep: Denied. Instances of acting out his dreams: Denied.  Psychiatric/Behavioral Health History: Depression: Acutely, he noted that he may be more quick to become irritated. Overall, he described his mood as positive and denied to his knowledge any prior mental health concerns or diagnoses. Current or remote suicidal ideation, intent, or plan was denied.  Anxiety: Denied. Mania: Denied. Trauma History: Denied. Visual/auditory hallucinations: Denied. Delusional thoughts: Denied.  Tobacco: Denied. Alcohol: He denied current alcohol consumption as well as a history of problematic alcohol abuse or dependence.  Recreational drugs: Denied.  Family History: Problem Relation Age of Onset   Heart disease Mother        before age 59   Hyperlipidemia Mother    Hypertension Mother    Heart attack Mother    Hyperlipidemia Father    Hypertension Father    Memory loss Brother    Memory loss Brother    Memory loss Half-Sister    Memory loss Maternal Aunt    This information was confirmed by Mr. Coach.  Academic/Vocational History: Highest level of educational attainment: 18 years. He graduated from [employer], earned a Oncologist in Furniture conservator/restorer, and earned a Manufacturing engineer in theology. He described himself as a good (A/B) student in all academic settings. Reading and writing represented potential relative weaknesses in earlier academic settings.  History of developmental delay: Denied. History of grade repetition: Denied. Enrollment in special education courses: Denied. History of LD/ADHD:  Denied.  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   Evaluation Results:   Behavioral Observations: Mr.  Plazola was accompanied by his daughter, arrived to his appointment on time, and was appropriately dressed and groomed. He appeared alert and oriented. Observed gait and station were within normal limits. Gross motor functioning appeared intact upon informal observation and no abnormal movements (e.g., tremors) were noted. His affect was generally relaxed and positive. Spontaneous speech was fluent and word finding difficulties were not observed during the clinical interview. Thought processes were coherent, organized, and normal in content. Insight into his cognitive difficulties appeared limited in that objective impairment revealed far greater and more widespread dysfunction relative to what he described during interview.   During testing, sustained attention was appropriate. Task engagement was adequate and he persisted when challenged. He did exhibit some response latency when providing responses to various questions at times. Overall, Mr. Hidrogo was cooperative with the clinical interview and subsequent testing procedures.   Adequacy of Effort: The validity of neuropsychological testing is limited by the extent to which the individual being tested may be assumed to have exerted adequate effort during testing. Mr. Zaring expressed his intention to perform to the best of his abilities and exhibited adequate task engagement and persistence. Scores across stand-alone and embedded performance validity measures were variable. Below expectation performances were very near clinical cut-offs and these scores likely represent true cognitive dysfunction rather than poor engagement. While the results of the current evaluation are believed to be a largely valid representation of Mr. Ballengee current cognitive functioning, mild caution is likely  prudent.  Test Results: Mr. Sudberry was mildly disoriented at the time of the current evaluation. He was unable to state the current date, was 90 minutes off when estimating the current time, and could not recall the name of the current clinic.   Intellectual abilities based upon educational and vocational attainment were estimated to be in the average to above average range. Premorbid abilities were estimated to be within the well below average range based upon a single-word reading test.   Processing speed was variable, ranging from the well below average to average normative ranges. Basic attention was well below average. More complex attention (e.g., working memory) was below average. Executive functioning was exceptionally low to below average. He scored in the below average range across a task assessing safety and judgment. Points were generally lost due to the provision of overly vague responses rather than unsafe or outright incorrect answers.  Assessed receptive language abilities were well below average. However, he only missed four points across this entire task, suggesting that this score likely represents a normative weakness rather than a clinical impairment. Mr. Abercrombie did not exhibit any difficulties comprehending task instructions and answered all questions asked of him appropriately during interview. Assessed expressive language was mildly variable. Phonemic fluency was average, semantic fluency was below average, and confrontation naming was well below average.    Assessed visuospatial/visuoconstructional abilities were well below average outside of his drawing of a clock. When drawing a clock, his first attempt was notable for poor spatial numerical arrangement. He recognized this and spontaneously made a second attempt with better spatial placements. Points were lost due to incorrect hand placement. Points were lost on his copy of a complex figure due to notable visual distortions  and several internal aspects being omitted entirely.   Learning (i.e., encoding) of novel information was average across a visual task but exceptionally low to well below average across all verbal tasks. Spontaneous delayed recall (i.e., retrieval) of previously learned information was commensurate with performances across learning trials. Retention  rates were 37% across a story learning task, 0% across a list learning task, 0% across a daily living task, and 83% across a shape learning task. Performance across recognition tasks was commensurate with performances across learning and retrieval tasks, suggesting some limited evidence for information consolidation.  Fine motor coordination and speed was exceptionally low when using his dominant (right) hand and well below average when using his non-dominant hand.    Results of emotional screening instruments suggested that recent symptoms of generalized anxiety were in the moderate range, while symptoms of depression were within normal limits. A screening instrument assessing recent sleep quality suggested the presence of minimal sleep dysfunction.  Tables of Scores:   Note: This summary of test scores accompanies the interpretive report and should not be considered in isolation without reference to the appropriate sections in the text. Descriptors are based on appropriate normative data and may be adjusted based on clinical judgment. Terms such as "Within Normal Limits" and "Outside Normal Limits" are used when a more specific description of the test score cannot be determined.       Percentile - Normative Descriptor > 98 - Exceptionally High 91-97 - Well Above Average 75-90 - Above Average 25-74 - Average 9-24 - Below Average 2-8 - Well Below Average < 2 - Exceptionally Low       Validity:   DESCRIPTOR       ACS Word Choice: --- --- Outside Normal Limits  Dot Counting Test: --- --- Within Normal Limits  NAB EVI: --- --- Outside Normal Limits   D-KEFS Color Word Effort Index: --- --- Within Normal Limits       Orientation:      Raw Score Percentile   NAB Orientation, Form 1 25/29 --- ---       Cognitive Screening:      Raw Score Percentile   SLUMS: 18/30 --- ---       Intellectual Functioning:      Standard Score Percentile   Barona Formula Estimated Premorbid IQ: 108 70 Average        Standard Score Percentile   Test of Premorbid Functioning: 69 7 Well Below Average       Memory:     NAB Memory Module, Form 1: Standard Score/ T Score Percentile   Total Memory Index 64 1 Exceptionally Low  List Learning       Total Trials 1-3 15/36 (33) 5 Well Below Average    List B 2/12 (33) 5 Well Below Average    Short Delay Free Recall 2/12 (23) <1 Exceptionally Low    Long Delay Free Recall 0/12 (21) <1 Exceptionally Low    Retention Percentage 0 (15) <1 Exceptionally Low    Recognition Discriminability 0 (28) 2 Well Below Average  Shape Learning       Total Trials 1-3 16/27 (51) 54 Average    Delayed Recall 5/9 (45) 31 Average    Retention Percentage 83 (45) 31 Average    Recognition Discriminability 7 (52) 58 Average  Story Learning       Immediate Recall 35/80 (29) 2 Well Below Average    Delayed Recall 7/40 (29) 2 Well Below Average    Retention Percentage 37 (24) <1 Exceptionally Low  Daily Living Memory       Immediate Recall 25/51 (25) 1 Exceptionally Low    Delayed Recall 0/17 (19) <1 Exceptionally Low    Retention Percentage 0 (-10) <1 Exceptionally Low    Recognition Hits 6/10 (28)  2 Well Below Average       Attention/Executive Function:     Trail Making Test (TMT): Raw Score (T Score) Percentile     Part A 74 secs.,  1 error (28) 2 Well Below Average    Part B 169 secs.,  3 errors (36) 8 Well Below Average         Scaled Score Percentile   WAIS-IV Coding: 4 2 Well Below Average       NAB Attention Module, Form 1: T Score Percentile     Digits Forward 30 2 Well Below Average    Digits Backwards 39 14  Below Average        Scaled Score Percentile   WAIS-IV Similarities: 7 16 Below Average       D-KEFS Color-Word Interference Test: Raw Score (Scaled Score) Percentile     Color Naming 41 secs. (6) 9 Below Average    Word Reading 29 secs. (8) 25 Average    Inhibition 55 secs. (12) 75 Above Average      Total Errors 13 errors (1) <1 Exceptionally Low    Inhibition/Switching 71 secs. (11) 63 Average      Total Errors 22 errors (1) <1 Exceptionally Low       D-KEFS Verbal Fluency Test: Raw Score (Scaled Score) Percentile     Letter Total Correct 36 (10) 50 Average    Category Total Correct 26 (6) 9 Below Average    Category Switching Total Correct 6 (2) <1 Exceptionally Low    Category Switching Accuracy 5 (4) 2 Well Below Average      Total Set Loss Errors 4 (8) 25 Average      Total Repetition Errors 4 (9) 37 Average       D-KEFS Design Fluency Test: Raw Score (Scaled Score) Percentile     Condition 1 - Filled Dots 11 (13) 84 Above Average    Condition 2 - Empty Dots 9 (10) 50 Average    Condition 3 - Switching 3 (6) 9 Below Average      Total Set Loss Errors 1 (12) 75 Above Average      Total Repetition Errors 2 (12) 75 Above Average       NAB Executive Functions Module, Form 1: T Score Percentile     Judgment 39 14 Below Average       Language:     Verbal Fluency Test: Raw Score (T Score) Percentile     Phonemic Fluency (FAS) 36 (44) 27 Average    Animal Fluency 14 (40) 16 Below Average        NAB Language Module, Form 1: T Score Percentile     Auditory Comprehension 32 4 Well Below Average    Naming 27/31 (28) 2 Well Below Average       Visuospatial/Visuoconstruction:      Raw Score Percentile   Clock Drawing: 9/10 --- Within Normal Limits       NAB Spatial Module, Form 1: T Score Percentile     Figure Drawing Copy 36 8 Well Below Average        Scaled Score Percentile   WAIS-IV Block Design: 5 5 Well Below Average       Sensory-Motor:     [place] Grooved  Pegboard Test: Raw Score Percentile     Dominant Hand 175 secs.,  1 drop <1 Exceptionally Low    Non-Dominant Hand 159 secs.,  0 drops  5 Well Below Average  Mood and Personality:      Raw Score Percentile   Beck Depression Inventory - II: 8 --- Within Normal Limits  PROMIS Anxiety Questionnaire: 21 --- Moderate       Additional Questionnaires:      Raw Score Percentile   PROMIS Sleep Disturbance Questionnaire: 22 --- None to Slight   Informed Consent and Coding/Compliance:   The current evaluation represents a clinical evaluation for the purposes previously outlined by the referral source and is in no way reflective of a forensic evaluation.   Mr. Hufford was provided with a verbal description of the nature and purpose of the present neuropsychological evaluation. Also reviewed were the foreseeable risks and/or discomforts and benefits of the procedure, limits of confidentiality, and mandatory reporting requirements of this provider. The patient was given the opportunity to ask questions and receive answers about the evaluation. Oral consent to participate was provided by the patient.   This evaluation was conducted by Newman Nickels, Ph.D., ABPP-CN, board certified clinical neuropsychologist. Mr. Dollard completed a clinical interview with Dr. Milbert Coulter, billed as one unit [phone], and 165 minutes of cognitive testing and scoring, billed as one unit [phone] and five additional units [id number]. Psychometrist Shan Levans, B.S., assisted Dr. Milbert Coulter with test administration and scoring procedures. As a separate and discrete service, Dr. Milbert Coulter spent a total of 160 minutes in interpretation and report writing billed as one unit [phone] and two units [id number].

## 2022-12-30 NOTE — Progress Notes (Signed)
   Psychometrician Note   Cognitive testing was administered to Connor Burns. by Cruzita Lederer, B.S. (psychometrist) under the supervision of Connor Burns, Ph.D., licensed psychologist on 12/30/2022. Connor Burns did not appear overtly distressed by the testing session per behavioral observation or responses across self-report questionnaires. Rest breaks were offered.    The battery of tests administered was selected by Connor Burns, Ph.D. with consideration to Connor Burns's current level of functioning, the nature of his symptoms, emotional and behavioral responses during interview, level of literacy, observed level of motivation/effort, and the nature of the referral question. This battery was communicated to the psychometrist. Communication between Connor Burns, Ph.D. and the psychometrist was ongoing throughout the evaluation and Connor Burns, Ph.D. was immediately accessible at all times. Connor Burns, Ph.D. provided supervision to the psychometrist on the date of this service to the extent necessary to assure the quality of all services provided.    Connor Burns. will return within approximately 1-2 weeks for an interactive feedback session with Dr. Melvyn Novas at which time his test performances, clinical impressions, and treatment recommendations will be reviewed in detail. Connor Burns understands he can contact our office should he require our assistance before this time.  A total of 165 minutes of billable time were spent face-to-face with Connor Burns by the psychometrist. This includes both test administration and scoring time. Billing for these services is reflected in the clinical report generated by Connor Burns, Ph.D.  This note reflects time spent with the psychometrician and does not include test scores or any clinical interpretations made by Dr. Melvyn Novas. The full report will follow in a separate note.

## 2023-01-06 ENCOUNTER — Ambulatory Visit (INDEPENDENT_AMBULATORY_CARE_PROVIDER_SITE_OTHER): Payer: BC Managed Care – PPO | Admitting: Psychology

## 2023-01-06 DIAGNOSIS — I639 Cerebral infarction, unspecified: Secondary | ICD-10-CM

## 2023-01-06 DIAGNOSIS — F03A0 Unspecified dementia, mild, without behavioral disturbance, psychotic disturbance, mood disturbance, and anxiety: Secondary | ICD-10-CM

## 2023-01-06 NOTE — Progress Notes (Signed)
   Neuropsychology Feedback Session Tillie Rung. [facility]  Reason for Referral:   Kaiser Belluomini. is a 67 y.o. 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   Feedback:   Mr. Schauer completed a comprehensive neuropsychological evaluation on 12/30/2022. Please refer to that encounter for the full report and recommendations. Briefly, results suggested ongoing impairment surrounding executive functioning, confrontation naming, visuospatial abilities (outside of clock drawing), and all aspects of verbal learning and memory. While receptive language also represented a normative impairment, he missed very few items across this task, suggesting that this might represent more of a normative impairment than a clinical impairment. An additional weakness was exhibited across attention/concentration while performance variability was exhibited across processing speed. The etiology of his presentation is unclear as current testing patterns are fairly nonspecific in nature. His most recent neuroimaging which I was able to review is in the form of a head CT dated to 2013. 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 Given that no more recent scans could be located, I am unable to comment on any additional events over the past 10-11 years or the severity of underlying cerebrovascular disease which could be playing an active role in his current presentation. 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 Across these tasks, he did not benefit from repeated exposure to new information, was largely amnestic after brief delays, and performed poorly across yes/no recognition trials. This suggests  concerns for rapid forgetting and a significant memory storage impairment. While this could raise concerns for the presence of a neurodegenerative illness such as Alzheimer's disease (especially given additional impairment surrounding confrontation naming), visual memory remained very strong. While this is both unexpected and encouraging, continued monitoring will be important to see if cognitive impairments remain stable or if progressive decline over the next few years occurs.   Mr. Ryser was accompanied by his daughter during the current telephone call. He was within an undisclosed location on a trip while I was within my office. I discussed the limitations of evaluation and management by telemedicine and the availability of in person appointments. Mr. Eisenhour expressed his understanding and agreed to proceed. Content of the current session focused on the results of his neuropsychological evaluation. Mr. Ines was given the opportunity to ask questions and his questions were answered. He was encouraged to reach out should additional questions arise. A copy of his report was mailed at the conclusion of the visit.      33 minutes were spent conducting the current feedback session with Mr. Belmares, billed as one unit [phone].

## 2023-01-22 ENCOUNTER — Telehealth: Payer: Self-pay | Admitting: Physician Assistant

## 2023-01-22 NOTE — Telephone Encounter (Signed)
He did cancel, must over seen date, he did have done recently

## 2023-01-22 NOTE — Telephone Encounter (Signed)
Pt's daughter called in and left a message with the access nurse. She is wanting to speak with someone about the pt's memory testing.

## 2023-01-22 NOTE — Telephone Encounter (Signed)
Spoke with POA, she is going to follow up with Clarise Cruz, for follow up testing after Dr.Merz, thanked me for answering

## 2023-02-05 ENCOUNTER — Ambulatory Visit: Payer: BC Managed Care – PPO | Admitting: Nurse Practitioner

## 2023-02-11 ENCOUNTER — Encounter: Payer: Self-pay | Admitting: Nurse Practitioner

## 2023-02-11 ENCOUNTER — Ambulatory Visit: Payer: BC Managed Care – PPO | Admitting: Nurse Practitioner

## 2023-02-11 ENCOUNTER — Ambulatory Visit
Admission: RE | Admit: 2023-02-11 | Discharge: 2023-02-11 | Disposition: A | Discharge status: Home / Self Care | Payer: BC Managed Care – PPO | Source: Ambulatory Visit | Attending: Nurse Practitioner | Admitting: Nurse Practitioner

## 2023-02-11 VITALS — BP 124/82 | HR 68 | Temp 97.7°F | Ht 74.0 in | Wt 262.0 lb

## 2023-02-11 DIAGNOSIS — R0602 Shortness of breath: Secondary | ICD-10-CM

## 2023-02-11 DIAGNOSIS — R5383 Other fatigue: Secondary | ICD-10-CM

## 2023-02-11 DIAGNOSIS — I509 Heart failure, unspecified: Secondary | ICD-10-CM

## 2023-02-11 DIAGNOSIS — I7025 Atherosclerosis of native arteries of other extremities with ulceration: Secondary | ICD-10-CM

## 2023-02-11 DIAGNOSIS — R7309 Other abnormal glucose: Secondary | ICD-10-CM

## 2023-02-11 NOTE — Patient Instructions (Signed)
Shortness of Breath, Adult Shortness of breath means you have trouble breathing. Shortness of breath could be a sign of a medical problem. Follow these instructions at home:  Pollution Do not smoke or use any products that contain nicotine or tobacco. If you need help quitting, ask your doctor. Avoid things that can make it harder to breathe, such as: Smoke of all kinds. This includes smoke from campfires or forest fires. Do not smoke or allow others to smoke in your home. Mold. Dust. Air pollution. Chemical smells. Things that can give you an allergic reaction (allergens) if you have allergies. Keep your living space clean. Use products that help remove mold and dust. General instructions Watch for any changes in your symptoms. Take over-the-counter and prescription medicines only as told by your doctor. This includes oxygen therapy and inhaled medicines. Rest as needed. Return to your normal activities when your doctor says that it is safe. Keep all follow-up visits. Contact a doctor if: Your condition does not get better as soon as expected. You have a hard time doing your normal activities, even after you rest. You have new symptoms. You cannot walk up stairs. You cannot exercise the way you normally do. Get help right away if: Your shortness of breath gets worse. You have trouble breathing when you are resting. You feel light-headed or you faint. You have a cough that is not helped by medicines. You cough up blood. You have pain with breathing. You have pain in your chest, arms, shoulders, or belly (abdomen). You have a fever. These symptoms may be an emergency. Get help right away. Call 911. Do not wait to see if the symptoms will go away. Do not drive yourself to the hospital. Summary Shortness of breath is when you have trouble breathing enough air. It can be a sign of a medical problem. Avoid things that make it hard for you to breathe, such as smoking, pollution,  mold, and dust. Watch for any changes in your symptoms. Contact your doctor if you do not get better or you get worse. This information is not intended to replace advice given to you by your health care provider. Make sure you discuss any questions you have with your health care provider. Document Revised: 07/28/2021 Document Reviewed: 07/28/2021 Elsevier Patient Education  [place].

## 2023-02-11 NOTE — Progress Notes (Signed)
I,[name],acting as a Education administrator for Connor Brine, FNP.,have documented all relevant documentation on the behalf of Connor Brine, FNP,as directed by  Connor Brine, FNP while in the presence of Connor Burns, Connor Burns.    Subjective:     Patient ID: Connor Burns. , male    DOB: [birth year] , 67 y.o.   MRN: [id number]   Chief Complaint  Patient presents with   Establish Care    HPI  Patient presents today to establish care. He was going to [employer] last visit in February 2024.  He works as a Equities trader. Married for 42 years. He has 3 daughters. He lives in [place].   Patient /daughter report increased shob w/exertion for the past several weeks, along with mid-abdominal pain with ambulation. Daughter states patient is on fluid pills but is not sure they are working.   He has not been diagnosed with diabetes.    He had been up on the furosemide in early February. He has a lot of reduced swelling in his leg this morning. Continues to have the shortness of breath.   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  He has been having fatigue. His ECHO is scheduled for March 5th, march 14th stress test and device check. March 25th new cardiology appt. February 26th to discuss testing.       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  He is to restart his Xarelto on Sunday night.   CAD (coronary artery disease)    CHF (congestive heart failure) 01/14/2017   Intermittent CHF with lower extremity edema.  I have given him Lasix to take on a p.r.n. basis.Formatting of this note might be different from the  original. Last Assessment & Plan: Formatting of this note might be different from the original. Intermittent CHF with lower extremity edema.  I have given him Las   Chronic kidney disease 10/27/2020   Stage II   Congestive cardiomyopathy 01/14/2017   Cardiomyopathy with LV dysfunction.Last ejection fraction 40-45% by echocardiography in 2012.Patient currently not taking beta-blocker Ace inhibitor for some reason.We will repeat echocardiography to evaluate LV function then determine the need for afterload reduction and beta-blocker therapy.[place]   Coronary atherosclerosis 01/14/2017   Status remote CABG in 2004 in [place].Left internal mammary artery to the obtuse marginal.Free Right internal mammary artery to the LAD.Saphenous vein graft to OM 2.Saphenous vein graft to the RCA.  Asymptomatic without chest pain or shortness of breath.Last nuclear stress test May 2018:  Large   DVT (deep venous thrombosis)    Dyslipidemia 05/07/2018   Hematuria 08/10/2015   Hypertension    ICD (implantable cardioverter-defibrillator) battery depletion 06/16/2018   Status post dual-chamber ICD generator change 06/16/2018.Last Assessment & Plan: Formatting of this note might be different from the original.Left infraclavicular incision intact with Dermabond.  There is no pocket hematoma present.  No drainage noted.Device interrogation this a.m. reveals appropriate device function.   Ischemic cardiomyopathy 06/18/2016   Echo 09/14/2011 showing LA 4.9cm, EF 40-45%.  Mild to moderate decreased LV systolic function.  Mild concentric LVH.  LA is moderately dilated.  Mild aortic cusp sclerosis.  Aortic valve appears to be bicuspid.B. Concerning for potential dysrhythmia (ventricular tachycardia) especially given documented nonsustained ventricular tachy   Mild dementia, unclear etiology 12/30/2022   Myocardial infarction    Obesity (BMI 30-39.9) 08/10/2015   Onychomycosis 05/10/2014   Painful legs and moving toes of left  foot 05/10/2014   Paroxysmal atrial fibrillation 08/08/2015   A. Paroxysmal.B. On Sotalol therapy.Last Assessment & Plan: History of present atrial fibrillation.  Patient will 80 mg twice daily.Also on rivaroxaban 20 mg a day.Patient again refused an ECG.I explained to the patient, that the absence of an EKG, I canno   Peripheral vascular disease    Shortness of breath 10/27/2020   Exertional   Stroke 10/27/2020   Right frontal infarct per neuroimaging dating back at least to 2013   Syncope 01/14/2017   In the setting of ischemic cardiomyopathy. B. Concerning for potential dysrhythmia (ventricular tachycardia) especially given documented nonsustained ventricular tachycardia - EPS 9/25/201 showing reproducibly inducible nonsustained monomorphic VT up to 10 beat.  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 In the setting of ischemic cardiomyopathy. B. Concerning for potential dysrhythmia (ventricular tachycardia) especially given documented nonsustained ventricular tachycardia - EPS 9/25/201 showing reproducibly inducible nonsustained monomorphic VT up to 10 beat.  Reproducibly inducible nonsustained polymorphic VT.  Inducible VF wi     Family History  Problem Relation Age of Onset   Heart disease Mother        before age 34   Hyperlipidemia Mother    Hypertension Mother    Heart attack Mother    Hyperlipidemia Father    Hypertension Father    Memory loss Brother    Memory loss Brother    Memory loss Half-Sister    Memory loss Maternal Aunt      Current Outpatient Medications:    aspirin EC 81 MG tablet, Take 81 mg by mouth at bedtime. Swallow whole., Disp: , Rfl:    cyclobenzaprine (FLEXERIL) 5 MG tablet, Take 1 tablet (5 mg total) by mouth at bedtime., Disp: 90 tablet, Rfl: 3   furosemide (LASIX) 20 MG tablet, TAKE 1 TABLET BY MOUTH AS NEEDED  FOR SWELLING, Disp: , Rfl:    lisinopril (PRINIVIL,ZESTRIL) 10 MG tablet, Take 10 mg by mouth daily., Disp: , Rfl:    sildenafil (REVATIO) 20 MG tablet, Take 20 mg by mouth as needed., Disp: , Rfl:    sotalol (BETAPACE) 80 MG tablet, Take 80 mg by mouth every evening., Disp: , Rfl:    XARELTO 20 MG TABS tablet, TAKE 1 TABLET BY MOUTH ONCE DAILY WITH SUPPER (Patient taking differently: Take 20 mg by mouth at bedtime.), Disp: 30 tablet, Rfl: 6   amoxicillin-clavulanate (AUGMENTIN) 875-125 MG tablet, Take 1 tablet by mouth 2 (two) times daily., Disp: 14 tablet, Rfl: 0   atorvastatin (LIPITOR) 20 MG tablet, Take by mouth. (Patient not taking: Reported on 03/18/2022), Disp: , Rfl:    colchicine 0.6 MG tablet, Take 0.6 mg by mouth 2 (two) times daily. (Patient not taking: Reported on 03/18/2022), Disp: , Rfl:    diclofenac (VOLTAREN) 75 MG EC tablet, Take 75 mg by mouth 2 (two) times daily. (Patient not taking: Reported on 02/11/2023), Disp: , Rfl:    oxybutynin (DITROPAN-XL) 10 MG 24 hr tablet, Take by mouth. (Patient not taking: Reported on 02/11/2023), Disp: , Rfl:    rosuvastatin (CRESTOR) 10 MG tablet,  Take 1 tablet (10 mg total) by mouth every other day. (Patient not taking: Reported on 03/18/2022), Disp: 30 tablet, Rfl: 11   Allergies  Allergen Reactions   Clopidogrel Anaphylaxis and Other (See Comments)    "Wierd, like out of body thing" Out of body experience    Plavix [Clopidogrel Bisulfate]      Review of Systems  Constitutional: Negative.   Respiratory: Negative.    Cardiovascular: Negative.   Psychiatric/Behavioral: Negative.       Today's Vitals   02/11/23 1211  BP: 124/82  Pulse: 68  Temp: 97.7 F (36.5 C)  TempSrc: Oral  SpO2: 99%  Weight: 262 lb (118.8 kg)  Height: '6\' 2"'$  (1.88 m)   Body mass index is 33.64 kg/m.   Objective:  Physical Exam      Assessment And Plan:     1. Shortness of breath Comments: Will order a CXR to evaluate his shortness of breath. I will  also order for a nurse to visit for evaluation and medication reconciliation/education - DG Chest 2 View; Future - Ambulatory referral to Home Health  2. Other fatigue Comments: Will check for metabolic causes. - Vitamin B12 - TSH - Ambulatory referral to Home Health  3. Atherosclerosis of native arteries of the extremities with ulceration Comments: Continue statin  4. Chronic congestive heart failure, unspecified heart failure type [facility]) Comments: He is transitioning to a new Cardiologist next month. Continue current follow up  5. Abnormal glucose Comments: Will check HgbA1c has not been checked in sme time - Hemoglobin A1c     Patient was given opportunity to ask questions. Patient verbalized understanding of the plan and was able to repeat key elements of the plan. All questions were answered to their satisfaction.  Connor Brine, FNP   I personally spent 60 minutes face-to-face and non-face-to-face in the care of this patient, which includes all pre-, intra-, and post visit time on the date of service. Reviewed Care everywhere charts to try to get a better understanding of his prior health issues. Long discussion with family as well on chronic health problems.  Teola Bradley, FNP, have reviewed all documentation for this visit. The documentation on 02/11/23 for the exam, diagnosis, procedures, and orders are all accurate and complete.   IF YOU HAVE BEEN REFERRED TO A SPECIALIST, IT MAY TAKE 1-2 WEEKS TO SCHEDULE/PROCESS THE REFERRAL. IF YOU HAVE NOT HEARD FROM US/SPECIALIST IN TWO WEEKS, PLEASE GIVE [place] A CALL AT [phone].   THE PATIENT IS ENCOURAGED TO PRACTICE SOCIAL DISTANCING DUE TO THE COVID-19 PANDEMIC.

## 2023-02-12 ENCOUNTER — Telehealth: Payer: Self-pay | Admitting: Physician Assistant

## 2023-02-12 ENCOUNTER — Ambulatory Visit: Payer: BC Managed Care – PPO | Admitting: Physician Assistant

## 2023-02-12 LAB — HEMOGLOBIN A1C
Est. average glucose Bld gHb Est-mCnc: 120 mg/dL
Hgb A1c MFr Bld: 5.8 % — ABNORMAL HIGH (ref 4.8–5.6)

## 2023-02-12 LAB — TSH: TSH: 2.67 u[IU]/mL (ref 0.450–4.500)

## 2023-02-12 LAB — VITAMIN B12: Vitamin B-12: 893 pg/mL (ref 232–1245)

## 2023-02-12 NOTE — Telephone Encounter (Signed)
Pt was supposed to have an appointment today, but was late. He rescheduled for 02/14/23. He wanted Connor Burns to be aware he will have some return to work forms with him that need to be filled out on Friday.

## 2023-02-12 NOTE — Progress Notes (Incomplete)
Assessment/Plan:     Mild Dementia without behavioral disturbance, unclear etiology    Connor Burns. is a very pleasant 67 y.o. RH male  with  a history of hypertension, hyperlipidemia, PVD, Afib on Xarelto, s/p ICD, congestive cardiomyopathy, history of stroke 2006, arthritis, CKD3, gout, peripheral neuropathy,  prediabetes,seen today in follow up for memory loss.  Neuropsych evaluation 12/30/26 yielded a diagnosis of mild dementia, unclear etiology.He is not on antidementia meds.    Follow up in   months. Start Donepezil 30m daily. Side effects discussed   Continue B12 supplements  Recommend good control of cardiovascular risk factors.   Continue to control mood as per PCP CT of the head (he is s/p PMP) for evaluation of structural and vascular abnormalities   Repeat Neuropsych testing for clarity of diagnosis and disease trajectory      Subjective:    This patient is accompanied in the office by *** who supplements the history.  Previous records as well as any outside records available were reviewed prior to todays visit. Patient was last seen on3/7/23 at which time his MoCA was 24/30  ***   Any changes in memory since last visit? repeats oneself?  Endorsed Disoriented when walking into a room?  Patient denies except occasionally not remembering what patient came to the room for ***  Leaving objects in unusual places?    denies   Wandering behavior?  denies   Any personality changes since last visit?  denies   Any worsening depression?:  denies   Hallucinations or paranoia?  denies   Seizures?    denies    Any sleep changes?  Denies vivid dreams, REM behavior or sleepwalking   Sleep apnea?   denies   Any hygiene concerns?    denies   Independent of bathing and dressing?  Endorsed  Does the patient needs help with medications? is in charge *** Who is in charge of the finances?   is in charge   *** Any changes in appetite?  denies ***   Patient have trouble swallowing?   denies   Does the patient cook?  Any kitchen accidents such as leaving the stove on? Patient denies   Any headaches?   denies   Chronic back pain  denies   Ambulates with difficulty?     denies   Recent falls or head injuries? denies     Unilateral weakness, numbness or tingling?    denies   Any tremors?  denies   Any anosmia?  Patient denies   Any incontinence of urine?  denies   Any bowel dysfunction?     denies      Patient lives  *** Does the patient drive?***   Initial visit 02/26/22 The patient is seen in neurologic consultation at the request of [name], [name], MD for the evaluation of memory.  The patient is accompanied by his daughter who supplements the history. This is a 67y.o. year old RH  male who has had memory issues for about 2 years, initially noticed by his family.  He is good at "it was not bad, but since January it got worse ".  Since that time, he is repeating himself more, but attributes this as multitasking, "I may become more distracted than I forget ".  He denies being disoriented when walking into her room or leaving objects in unusual places.  He ambulates without difficulty, denies any falls or head injuries.  He works out at least 2 times a  week.  He continues to drive, and denies getting lost.  He lives with his wife and his youngest daughter.  His mood is good, he has moments in which she is more irritable "my father is a very passionate person, and he gets angry if he gets bothered ".  "He is not as bad as he used to be ".  He denies any history of depression or anxiety, hallucinations or paranoia.  He sleeps fairly well, denies vivid dreams or sleepwalking or REM behavior.  There are no hygiene concerns, he is independent of bathing and dressing.  Sometimes he forgets his medications.  There are no issues with his finances.  "I am on top of it ".  His appetite is not great according to him, he is trying to control his sugar intake to maintain a better weight.  He  denies any trouble swallowing.  He does not cook.  He denies any headaches,double vision, dizziness, no new focal numbness or tingling, unilateral weakness, tremors or anosmia. No history of seizures. Denies urine incontinence, retention, constipation or diarrhea.  Denies OSA, ETOH or Tobacco. Family History remarkable for maternal grandmother and sister "with some sort of dementia "   Neuropsych evaluation 12/30/22  Briefly, results suggested ongoing impairment surrounding executive functioning, confrontation naming, visuospatial abilities (outside of clock drawing), and all aspects of verbal learning and memory. While receptive language also represented a normative impairment, he missed very few items across this task, suggesting that this might represent more of a normative impairment than a clinical impairment. An additional weakness was exhibited across attention/concentration while performance variability was exhibited across processing speed. The etiology of his presentation is unclear as current testing patterns are fairly nonspecific in nature. His most recent neuroimaging which I was able to review is in the form of a head CT dated to 2013. 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 Given that no more recent scans could be located, I am unable to comment on any additional events over the past 10-11 years or the severity of underlying cerebrovascular disease which could be playing an active role in his current presentation. 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 Across these tasks, he did not benefit from repeated exposure to new information, was largely amnestic after brief delays, and performed poorly across yes/no recognition trials. This suggests concerns for rapid forgetting and a significant memory storage impairment. While this could raise concerns for the presence of a neurodegenerative illness such as  Alzheimer's disease (especially given additional impairment surrounding confrontation naming), visual memory remained very strong. While this is both unexpected and encouraging, continued monitoring will be important to see if cognitive impairments remain stable or if progressive decline over the next few years occurs.     Recent B12  893, TSH 2.6, A1C 5.8  PREVIOUS MEDICATIONS:   CURRENT MEDICATIONS:  Outpatient Encounter Medications as of 02/12/2023  Medication Sig   aspirin EC 81 MG tablet Take 81 mg by mouth at bedtime. Swallow whole.   atorvastatin (LIPITOR) 20 MG tablet Take by mouth. (Patient not taking: Reported on 03/18/2022)   colchicine 0.6 MG tablet Take 0.6 mg by mouth 2 (two) times daily. (Patient not taking: Reported on 03/18/2022)   cyclobenzaprine (FLEXERIL) 5 MG tablet Take 1 tablet (5 mg total) by mouth at bedtime.   diclofenac (VOLTAREN) 75 MG EC tablet Take 75 mg by mouth 2 (two) times daily. (Patient not taking: Reported on 02/11/2023)  furosemide (LASIX) 20 MG tablet TAKE 1 TABLET BY MOUTH AS NEEDED FOR SWELLING   lisinopril (PRINIVIL,ZESTRIL) 10 MG tablet Take 10 mg by mouth daily.   oxybutynin (DITROPAN-XL) 10 MG 24 hr tablet Take by mouth. (Patient not taking: Reported on 02/11/2023)   rosuvastatin (CRESTOR) 10 MG tablet Take 1 tablet (10 mg total) by mouth every other day. (Patient not taking: Reported on 03/18/2022)   sildenafil (REVATIO) 20 MG tablet Take 20 mg by mouth as needed.   sotalol (BETAPACE) 80 MG tablet Take 80 mg by mouth every evening.   XARELTO 20 MG TABS tablet TAKE 1 TABLET BY MOUTH ONCE DAILY WITH SUPPER (Patient taking differently: Take 20 mg by mouth at bedtime.)   No facility-administered encounter medications on file as of 02/12/2023.        No data to display            03/18/2022    3:00 PM  Montreal Cognitive Assessment   Visuospatial/ Executive (0/5) 3  Naming (0/3) 3  Attention: Read list of digits (0/2) 2  Attention: Read list of  letters (0/1) 1  Attention: Serial 7 subtraction starting at 100 (0/3) 3  Language: Repeat phrase (0/2) 1  Language : Fluency (0/1) 1  Abstraction (0/2) 1  Delayed Recall (0/5) 3  Orientation (0/6) 6  Total 24  Adjusted Score (based on education) 24    Objective:     PHYSICAL EXAMINATION:    VITALS:  There were no vitals filed for this visit.  GEN:  The patient appears stated age and is in NAD. HEENT:  Normocephalic, atraumatic.   Neurological examination:  General: NAD, well-groomed, appears stated age. Orientation: The patient is alert. Oriented to person, place and date Cranial nerves: There is good facial symmetry.The speech is fluent and clear. No aphasia or dysarthria. Fund of knowledge is appropriate. Recent and remote memory are impaired. Attention and concentration are reduced.  Able to name objects and repeat phrases.  Hearing is intact to conversational tone.    Sensation: Sensation is intact to light touch throughout Motor: Strength is at least antigravity x4. DTR's 2/4 in UE/LE     Movement examination: Tone: There is normal tone in the UE/LE Abnormal movements:  no tremor.  No myoclonus.  No asterixis.   Coordination:  There is no decremation with RAM's. Normal finger to nose  Gait and Station: The patient has no difficulty arising out of a deep-seated chair without the use of the hands. The patient's stride length is good.  Gait is cautious and narrow.    Thank you for allowing [place] the opportunity to participate in the care of this nice patient. Please do not hesitate to contact us for any questions or concerns.   Total time spent on today's visit was *** minutes dedicated to this patient today, preparing to see patient, examining the patient, ordering tests and/or medications and counseling the patient, documenting clinical information in the EHR or other health record, independently interpreting results and communicating results to the patient/family,  discussing treatment and goals, answering patient's questions and coordinating care.  Cc:  Lujean Amel, MD  Sharene Butters 02/12/2023 6:31 AM

## 2023-02-14 ENCOUNTER — Telehealth: Payer: Self-pay

## 2023-02-14 ENCOUNTER — Other Ambulatory Visit: Payer: Self-pay | Admitting: Nurse Practitioner

## 2023-02-14 ENCOUNTER — Ambulatory Visit: Payer: BC Managed Care – PPO | Admitting: Physician Assistant

## 2023-02-14 DIAGNOSIS — J189 Pneumonia, unspecified organism: Secondary | ICD-10-CM

## 2023-02-14 MED ORDER — AMOXICILLIN-POT CLAVULANATE 875-125 MG PO TABS: 1.0000 | ORAL_TABLET | Freq: Two times a day (BID) | ORAL | 0 refills | Status: DC

## 2023-02-14 NOTE — Progress Notes (Incomplete)
Assessment/Plan:     Mild Dementia without behavioral disturbance, unclear etiology    Connor Hibma. is a very pleasant 67 y.o. RH male  with  a history of hypertension, hyperlipidemia, PVD, Afib on Xarelto, s/p ICD, congestive cardiomyopathy, history of stroke 2006, arthritis, CKD3, gout, peripheral neuropathy,  prediabetes,seen today in follow up for memory loss.  Neuropsych evaluation 12/30/26 yielded a diagnosis of mild dementia, unclear etiology.He is not on antidementia meds.    Follow up in   months. Start Donepezil '5mg'$  daily. Side effects discussed   Continue B12 supplements  Recommend good control of cardiovascular risk factors.   Continue to control mood as per PCP CT of the head (he is s/p PMP) for evaluation of structural and vascular abnormalities   Repeat Neuropsych testing for clarity of diagnosis and disease trajectory      Subjective:    This patient is accompanied in the office by *** who supplements the history.  Previous records as well as any outside records available were reviewed prior to todays visit. Patient was last seen on3/7/23 at which time his MoCA was 24/30  ***   Any changes in memory since last visit? repeats oneself?  Endorsed Disoriented when walking into a room?  Patient denies except occasionally not remembering what patient came to the room for ***  Leaving objects in unusual places?    denies   Wandering behavior?  denies   Any personality changes since last visit?  denies   Any worsening depression?:  denies   Hallucinations or paranoia?  denies   Seizures?    denies    Any sleep changes?  Denies vivid dreams, REM behavior or sleepwalking   Sleep apnea?   denies   Any hygiene concerns?    denies   Independent of bathing and dressing?  Endorsed  Does the patient needs help with medications? is in charge *** Who is in charge of the finances?   is in charge   *** Any changes in appetite?  denies ***   Patient have trouble swallowing?   denies   Does the patient cook?  Any kitchen accidents such as leaving the stove on? Patient denies   Any headaches?   denies   Chronic back pain  denies   Ambulates with difficulty?     denies   Recent falls or head injuries? denies     Unilateral weakness, numbness or tingling?    denies   Any tremors?  denies   Any anosmia?  Patient denies   Any incontinence of urine?  denies   Any bowel dysfunction?     denies      Patient lives  *** Does the patient drive?***   Initial visit 02/26/22 The patient is seen in neurologic consultation at the request of [name], [name], MD for the evaluation of memory.  The patient is accompanied by his daughter who supplements the history. This is a 67 y.o. year old RH  male who has had memory issues for about 2 years, initially noticed by his family.  He is good at "it was not bad, but since January it got worse ".  Since that time, he is repeating himself more, but attributes this as multitasking, "I may become more distracted than I forget ".  He denies being disoriented when walking into her room or leaving objects in unusual places.  He ambulates without difficulty, denies any falls or head injuries.  He works out at least 2 times a  week.  He continues to drive, and denies getting lost.  He lives with his wife and his youngest daughter.  His mood is good, he has moments in which she is more irritable "my father is a very passionate person, and he gets angry if he gets bothered ".  "He is not as bad as he used to be ".  He denies any history of depression or anxiety, hallucinations or paranoia.  He sleeps fairly well, denies vivid dreams or sleepwalking or REM behavior.  There are no hygiene concerns, he is independent of bathing and dressing.  Sometimes he forgets his medications.  There are no issues with his finances.  "I am on top of it ".  His appetite is not great according to him, he is trying to control his sugar intake to maintain a better weight.  He  denies any trouble swallowing.  He does not cook.  He denies any headaches,double vision, dizziness, no new focal numbness or tingling, unilateral weakness, tremors or anosmia. No history of seizures. Denies urine incontinence, retention, constipation or diarrhea.  Denies OSA, ETOH or Tobacco. Family History remarkable for maternal grandmother and sister "with some sort of dementia "   Neuropsych evaluation 12/30/22  Briefly, results suggested ongoing impairment surrounding executive functioning, confrontation naming, visuospatial abilities (outside of clock drawing), and all aspects of verbal learning and memory. While receptive language also represented a normative impairment, he missed very few items across this task, suggesting that this might represent more of a normative impairment than a clinical impairment. An additional weakness was exhibited across attention/concentration while performance variability was exhibited across processing speed. The etiology of his presentation is unclear as current testing patterns are fairly nonspecific in nature. His most recent neuroimaging which I was able to review is in the form of a head CT dated to 2013. 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 Given that no more recent scans could be located, I am unable to comment on any additional events over the past 10-11 years or the severity of underlying cerebrovascular disease which could be playing an active role in his current presentation. 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 Across these tasks, he did not benefit from repeated exposure to new information, was largely amnestic after brief delays, and performed poorly across yes/no recognition trials. This suggests concerns for rapid forgetting and a significant memory storage impairment. While this could raise concerns for the presence of a neurodegenerative illness such as  Alzheimer's disease (especially given additional impairment surrounding confrontation naming), visual memory remained very strong. While this is both unexpected and encouraging, continued monitoring will be important to see if cognitive impairments remain stable or if progressive decline over the next few years occurs.     Recent B12  893, TSH 2.6, A1C 5.8  PREVIOUS MEDICATIONS:   CURRENT MEDICATIONS:  Outpatient Encounter Medications as of 02/14/2023  Medication Sig   aspirin EC 81 MG tablet Take 81 mg by mouth at bedtime. Swallow whole.   atorvastatin (LIPITOR) 20 MG tablet Take by mouth. (Patient not taking: Reported on 03/18/2022)   colchicine 0.6 MG tablet Take 0.6 mg by mouth 2 (two) times daily. (Patient not taking: Reported on 03/18/2022)   cyclobenzaprine (FLEXERIL) 5 MG tablet Take 1 tablet (5 mg total) by mouth at bedtime.   diclofenac (VOLTAREN) 75 MG EC tablet Take 75 mg by mouth 2 (two) times daily. (Patient not taking: Reported on 02/11/2023)  furosemide (LASIX) 20 MG tablet TAKE 1 TABLET BY MOUTH AS NEEDED FOR SWELLING   lisinopril (PRINIVIL,ZESTRIL) 10 MG tablet Take 10 mg by mouth daily.   oxybutynin (DITROPAN-XL) 10 MG 24 hr tablet Take by mouth. (Patient not taking: Reported on 02/11/2023)   rosuvastatin (CRESTOR) 10 MG tablet Take 1 tablet (10 mg total) by mouth every other day. (Patient not taking: Reported on 03/18/2022)   sildenafil (REVATIO) 20 MG tablet Take 20 mg by mouth as needed.   sotalol (BETAPACE) 80 MG tablet Take 80 mg by mouth every evening.   XARELTO 20 MG TABS tablet TAKE 1 TABLET BY MOUTH ONCE DAILY WITH SUPPER (Patient taking differently: Take 20 mg by mouth at bedtime.)   No facility-administered encounter medications on file as of 02/14/2023.        No data to display            03/18/2022    3:00 PM  Montreal Cognitive Assessment   Visuospatial/ Executive (0/5) 3  Naming (0/3) 3  Attention: Read list of digits (0/2) 2  Attention: Read list of  letters (0/1) 1  Attention: Serial 7 subtraction starting at 100 (0/3) 3  Language: Repeat phrase (0/2) 1  Language : Fluency (0/1) 1  Abstraction (0/2) 1  Delayed Recall (0/5) 3  Orientation (0/6) 6  Total 24  Adjusted Score (based on education) 24    Objective:     PHYSICAL EXAMINATION:    VITALS:  There were no vitals filed for this visit.  GEN:  The patient appears stated age and is in NAD. HEENT:  Normocephalic, atraumatic.   Neurological examination:  General: NAD, well-groomed, appears stated age. Orientation: The patient is alert. Oriented to person, place and date Cranial nerves: There is good facial symmetry.The speech is fluent and clear. No aphasia or dysarthria. Fund of knowledge is appropriate. Recent and remote memory are impaired. Attention and concentration are reduced.  Able to name objects and repeat phrases.  Hearing is intact to conversational tone.    Sensation: Sensation is intact to light touch throughout Motor: Strength is at least antigravity x4. DTR's 2/4 in UE/LE     Movement examination: Tone: There is normal tone in the UE/LE Abnormal movements:  no tremor.  No myoclonus.  No asterixis.   Coordination:  There is no decremation with RAM's. Normal finger to nose  Gait and Station: The patient has no difficulty arising out of a deep-seated chair without the use of the hands. The patient's stride length is good.  Gait is cautious and narrow.    Thank you for allowing [place] the opportunity to participate in the care of this nice patient. Please do not hesitate to contact us for any questions or concerns.   Total time spent on today's visit was *** minutes dedicated to this patient today, preparing to see patient, examining the patient, ordering tests and/or medications and counseling the patient, documenting clinical information in the EHR or other health record, independently interpreting results and communicating results to the patient/family,  discussing treatment and goals, answering patient's questions and coordinating care.  Cc:  [name], FNP  Sharene Butters 02/14/2023 7:26 AM

## 2023-02-14 NOTE — Telephone Encounter (Addendum)
Connor Burns is here at the office today and is requesting a call back as she wants to really discuss what has been going on with her dad and why they are arriving late for the appointments. Patient is concerned with the diagnosis given. Would like a call back today if possible as well as family would like to speak with office manager. I did express to the daughter how short staffed we are right now and that it more likely would be Monday before someone can reach out.

## 2023-02-17 ENCOUNTER — Ambulatory Visit: Payer: BC Managed Care – PPO | Admitting: Physician Assistant

## 2023-02-18 ENCOUNTER — Encounter: Payer: Self-pay | Admitting: Nurse Practitioner

## 2023-02-18 NOTE — Telephone Encounter (Signed)
The daughter called back and stated that was fine. They also need to address her fathers job issues, as they continue to have problems with that. I let her know I would relay that message

## 2023-02-18 NOTE — Telephone Encounter (Signed)
No answer at 10:34am 02/18/2023 per Sharene Butters, PA-C, can address on rescheduled visit concerns. Missed several appts

## 2023-02-19 ENCOUNTER — Ambulatory Visit: Payer: BC Managed Care – PPO | Admitting: Cardiovascular Disease

## 2023-02-20 ENCOUNTER — Ambulatory Visit (INDEPENDENT_AMBULATORY_CARE_PROVIDER_SITE_OTHER): Payer: BC Managed Care – PPO | Admitting: Physician Assistant

## 2023-02-20 ENCOUNTER — Encounter: Payer: Self-pay | Admitting: Physician Assistant

## 2023-02-20 VITALS — BP 138/74 | HR 89 | Resp 18 | Ht 74.0 in

## 2023-02-20 DIAGNOSIS — F03A0 Unspecified dementia, mild, without behavioral disturbance, psychotic disturbance, mood disturbance, and anxiety: Secondary | ICD-10-CM | POA: Diagnosis not present

## 2023-02-20 MED ORDER — DONEPEZIL HCL 5 MG PO TABS: 5.0000 mg | ORAL_TABLET | Freq: Every day | ORAL | 0 refills | Status: DC

## 2023-02-20 NOTE — Progress Notes (Signed)
Assessment/Plan:    Mild Dementia without behavioral disturbance, unclear etiology    Connor Burns. is a very pleasant 67 y.o. RH male  with  a history of hypertension, hyperlipidemia, PVD, Afib on Xarelto, s/p ICD, congestive cardiomyopathy, history of stroke 2006, arthritis, CKD3, gout, peripheral neuropathy,  prediabetes,seen today in follow up for memory loss.  Neuropsych evaluation 12/30/26 yielded a diagnosis of mild dementia, unclear etiology. He is not on antidementia meds. 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    Follow up in 6  months. Start Donepezil '5mg'$  daily. Side effects discussed   Continue B12 supplements  Recommend good control of cardiovascular risk factors.   Continue to control mood as per PCP Repeat Neuropsych testing for clarity of diagnosis and disease trajectory bet 12-18 months      Subjective:    This patient is accompanied in the office by his wife who supplements the history.  Previous records as well as any outside records available were reviewed prior to todays visit. Patient was last seen on 02/26/22 at which time his MoCA was 24/30      Any changes in memory since last visit? "Same, did not notice anything" . Wife reports that his memory may be about the same, but she reports that he repeats questions  and tells the same stories. He does repeat himself during this visit, but is able to convey his thought appropriately.  Disoriented when walking into a room?  Patient denies    Leaving objects in unusual places?    denies   Wandering behavior?  denies   Any personality changes since last visit?  Denies. He report that at work, "there is someone that is less experienced than me and is risking my work ".   Any worsening depression?:  denies   Hallucinations or paranoia?  denies   Seizures?    denies    Any sleep changes?  Denies vivid dreams, REM behavior or  sleepwalking   Sleep apnea?   denies   Any hygiene concerns?    denies   Independent of bathing and dressing?  Endorsed  Does the patient needs help with medications? Wife is in charge  Who is in charge of the finances? Wife  is in charge     Any changes in appetite?  denies     Patient have trouble swallowing?  denies   Does the patient cook?  Any kitchen accidents such as leaving the stove on? Patient denies   Any headaches?   denies   Chronic back pain  denies   Ambulates with difficulty?     denies   Recent falls or head injuries? denies     Unilateral weakness, numbness or tingling?    denies   Any tremors?  denies   Any anosmia?  Patient denies   Any incontinence of urine?  denies   Any bowel dysfunction?     denies      Patient lives  with his wife Does the patient drive? endorsed   Initial visit 02/26/22 The patient is seen in neurologic consultation at the request of [name], [name], MD for the evaluation of memory.  The patient is accompanied by his daughter who supplements the history. This is a 67 y.o. year old RH  male who has had memory issues for about 2 years, initially noticed by his family.  He is good at "it was not bad, but since January it got worse ".  Since that time, he is repeating himself more, but attributes this as multitasking, "I may become more distracted than I forget ".  He denies being disoriented when walking into her room or leaving objects in unusual places.  He ambulates without difficulty, denies any falls or head injuries.  He works out at least 2 times a week.  He continues to drive, and denies getting lost.  He lives with his wife and his youngest daughter.  His mood is good, he has moments in which she is more irritable "my father is a very passionate person, and he gets angry if he gets bothered ".  "He is not as bad as he used to be ".  He denies any history of depression or anxiety, hallucinations or paranoia.  He sleeps fairly well, denies vivid  dreams or sleepwalking or REM behavior.  There are no hygiene concerns, he is independent of bathing and dressing.  Sometimes he forgets his medications.  There are no issues with his finances.  "I am on top of it ".  His appetite is not great according to him, he is trying to control his sugar intake to maintain a better weight.  He denies any trouble swallowing.  He does not cook.  He denies any headaches,double vision, dizziness, no new focal numbness or tingling, unilateral weakness, tremors or anosmia. No history of seizures. Denies urine incontinence, retention, constipation or diarrhea.  Denies OSA, ETOH or Tobacco. Family History remarkable for maternal grandmother and sister "with some sort of dementia "   Neuropsych evaluation 12/30/22  Briefly, results suggested ongoing impairment surrounding executive functioning, confrontation naming, visuospatial abilities (outside of clock drawing), and all aspects of verbal learning and memory. While receptive language also represented a normative impairment, he missed very few items across this task, suggesting that this might represent more of a normative impairment than a clinical impairment. An additional weakness was exhibited across attention/concentration while performance variability was exhibited across processing speed. The etiology of his presentation is unclear as current testing patterns are fairly nonspecific in nature. His most recent neuroimaging which I was able to review is in the form of a head CT dated to 2013. 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 Given that no more recent scans could be located, I am unable to comment on any additional events over the past 10-11 years or the severity of underlying cerebrovascular disease which could be playing an active role in his current presentation. 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 Across these tasks, he did  not benefit from repeated exposure to new information, was largely amnestic after brief delays, and performed poorly across yes/no recognition trials. This suggests concerns for rapid forgetting and a significant memory storage impairment. While this could raise concerns for the presence of a neurodegenerative illness such as Alzheimer's disease (especially given additional impairment surrounding confrontation naming), visual memory remained very strong. While this is both unexpected and encouraging, continued monitoring will be important to see if cognitive impairments remain stable or if progressive decline over the next few years occurs.     Recent B12  893, TSH 2.6, A1C 5.8   PREVIOUS MEDICATIONS:   CURRENT MEDICATIONS:  Outpatient Encounter Medications as of 02/20/2023  Medication Sig   amoxicillin-clavulanate (AUGMENTIN) 875-125 MG tablet Take 1 tablet by mouth 2 (two) times daily.   aspirin  EC 81 MG tablet Take 81 mg by mouth at bedtime. Swallow whole.   cyclobenzaprine (FLEXERIL) 5 MG tablet Take 1 tablet (5 mg total) by mouth at bedtime.   diclofenac (VOLTAREN) 75 MG EC tablet Take 75 mg by mouth 2 (two) times daily.   donepezil (ARICEPT) 5 MG tablet Take 1 tablet (5 mg total) by mouth daily.   furosemide (LASIX) 20 MG tablet TAKE 1 TABLET BY MOUTH AS NEEDED FOR SWELLING   lisinopril (PRINIVIL,ZESTRIL) 10 MG tablet Take 10 mg by mouth daily.   oxybutynin (DITROPAN-XL) 10 MG 24 hr tablet Take by mouth.   rosuvastatin (CRESTOR) 10 MG tablet Take 1 tablet (10 mg total) by mouth every other day.   sildenafil (REVATIO) 20 MG tablet Take 20 mg by mouth as needed.   sotalol (BETAPACE) 80 MG tablet Take 80 mg by mouth every evening.   XARELTO 20 MG TABS tablet TAKE 1 TABLET BY MOUTH ONCE DAILY WITH SUPPER (Patient taking differently: Take 20 mg by mouth at bedtime.)   atorvastatin (LIPITOR) 20 MG tablet Take by mouth. (Patient not taking: Reported on 02/20/2023)   colchicine 0.6 MG tablet Take  0.6 mg by mouth 2 (two) times daily. (Patient not taking: Reported on 02/20/2023)   No facility-administered encounter medications on file as of 02/20/2023.        No data to display            03/18/2022    3:00 PM  Montreal Cognitive Assessment   Visuospatial/ Executive (0/5) 3  Naming (0/3) 3  Attention: Read list of digits (0/2) 2  Attention: Read list of letters (0/1) 1  Attention: Serial 7 subtraction starting at 100 (0/3) 3  Language: Repeat phrase (0/2) 1  Language : Fluency (0/1) 1  Abstraction (0/2) 1  Delayed Recall (0/5) 3  Orientation (0/6) 6  Total 24  Adjusted Score (based on education) 24    Objective:     PHYSICAL EXAMINATION:    VITALS:   Vitals:   02/20/23 1606  BP: 138/74  Pulse: 89  Resp: 18  SpO2: 100%  Height: '6\' 2"'$  (1.88 m)    GEN:  The patient appears stated age and is in NAD. HEENT:  Normocephalic, atraumatic.   Neurological examination:  General: NAD, well-groomed, appears stated age. Orientation: The patient is alert. Oriented to person, place and date Cranial nerves: There is good facial symmetry.The speech is fluent and clear. No aphasia or dysarthria. Fund of knowledge is appropriate. Recent and remote memory are impaired. Attention and concentration are reduced.  Able to name objects and repeat phrases.  Hearing is intact to conversational tone.    Sensation: Sensation is intact to light touch throughout Motor: Strength is at least antigravity x4. DTR's 2/4 in UE/LE     Movement examination: Tone: There is normal tone in the UE/LE Abnormal movements:  no tremor.  No myoclonus.  No asterixis.   Coordination:  There is no decremation with RAM's. Normal finger to nose  Gait and Station: The patient has no difficulty arising out of a deep-seated chair without the use of the hands. The patient's stride length is good.  Gait is cautious and narrow.    Thank you for allowing [place] the opportunity to participate in the care of this  nice patient. Please do not hesitate to contact us for any questions or concerns.   Total time spent on today's visit was 30 minutes dedicated to this patient today, preparing to see patient,  examining the patient, ordering tests and/or medications and counseling the patient, documenting clinical information in the EHR or other health record, independently interpreting results and communicating results to the patient/family, discussing treatment and goals, answering patient's questions and coordinating care.  Cc:  [name], FNP  Sharene Butters 02/20/2023 6:16 PM

## 2023-02-20 NOTE — Patient Instructions (Addendum)
It was a pleasure to see you today at our office.   Recommendations:  Neurocognitive test in 12-18 month s Start Donepezil '5mg'$  daily. Side effects discussed   Follow up in 6 month     RECOMMENDATIONS FOR ALL PATIENTS WITH MEMORY PROBLEMS: 1. Continue to exercise (Recommend 30 minutes of walking everyday, or 3 hours every week) 2. Increase social interactions - continue going to [place] and enjoy social gatherings with friends and family 3. Eat healthy, avoid fried foods and eat more fruits and vegetables 4. Maintain adequate blood pressure, blood sugar, and blood cholesterol level. Reducing the risk of stroke and cardiovascular disease also helps promoting better memory. 5. Avoid stressful situations. Live a simple life and avoid aggravations. Organize your time and prepare for the next day in anticipation. 6. Sleep well, avoid any interruptions of sleep and avoid any distractions in the bedroom that may interfere with adequate sleep quality 7. Avoid sugar, avoid sweets as there is a strong link between excessive sugar intake, diabetes, and cognitive impairment We discussed the Mediterranean diet, which has been shown to help patients reduce the risk of progressive memory disorders and reduces cardiovascular risk. This includes eating fish, eat fruits and green leafy vegetables, nuts like almonds and hazelnuts, walnuts, and also use olive oil. Avoid fast foods and fried foods as much as possible. Avoid sweets and sugar as sugar use has been linked to worsening of memory function.  There is always a concern of gradual progression of memory problems. If this is the case, then we may need to adjust level of care according to patient needs. Support, both to the patient and caregiver, should then be put into place.      You have been referred for a neuropsychological evaluation (i.e., evaluation of memory and thinking abilities). Please bring someone with you to this appointment if possible, as it  is helpful for the doctor to hear from both you and another adult who knows you well. Please bring eyeglasses and hearing aids if you wear them.    The evaluation will take approximately 3 hours and has two parts:   The first part is a clinical interview with the neuropsychologist (Dr. Melvyn Novas or Dr. Nicole Kindred). During the interview, the neuropsychologist will speak with you and the individual you brought to the appointment.    The second part of the evaluation is testing with the doctor's technician Hinton Dyer or Maudie Mercury). During the testing, the technician will ask you to remember different types of material, solve problems, and answer some questionnaires. Your family member will not be present for this portion of the evaluation.   Please note: We must reserve several hours of the neuropsychologist's time and the psychometrician's time for your evaluation appointment. As such, there is a No-Show fee of $100. If you are unable to attend any of your appointments, please contact our office as soon as possible to reschedule.    FALL PRECAUTIONS: Be cautious when walking. Scan the area for obstacles that may increase the risk of trips and falls. When getting up in the mornings, sit up at the edge of the bed for a few minutes before getting out of bed. Consider elevating the bed at the head end to avoid drop of blood pressure when getting up. Walk always in a well-lit room (use night lights in the walls). Avoid area rugs or power cords from appliances in the middle of the walkways. Use a walker or a cane if necessary and consider physical therapy for  balance exercise. Get your eyesight checked regularly.  FINANCIAL OVERSIGHT: Supervision, especially oversight when making financial decisions or transactions is also recommended.  HOME SAFETY: Consider the safety of the kitchen when operating appliances like stoves, microwave oven, and blender. Consider having supervision and share cooking responsibilities until no longer  able to participate in those. Accidents with firearms and other hazards in the house should be identified and addressed as well.   ABILITY TO BE LEFT ALONE: If patient is unable to contact 911 operator, consider using LifeLine, or when the need is there, arrange for someone to stay with patients. Smoking is a fire hazard, consider supervision or cessation. Risk of wandering should be assessed by caregiver and if detected at any point, supervision and safe proof recommendations should be instituted.  MEDICATION SUPERVISION: Inability to self-administer medication needs to be constantly addressed. Implement a mechanism to ensure safe administration of the medications.   DRIVING: Regarding driving, in patients with progressive memory problems, driving will be impaired. We advise to have someone else do the driving if trouble finding directions or if minor accidents are reported. Independent driving assessment is available to determine safety of driving.   If you are interested in the driving assessment, you can contact the following:  [employer] in [place]  [place] [phone] or [phone]    [place] refers to food and lifestyle choices that are based on the traditions of countries located on the [employer]. This way of eating has been shown to help prevent certain conditions and improve outcomes for people who have chronic diseases, like kidney disease and heart disease. What are tips for following this plan? Lifestyle  Cook and eat meals together with your family, when possible. Drink enough fluid to keep your urine clear or pale yellow. Be physically active every day. This includes: Aerobic exercise like running or swimming. Leisure activities like gardening, walking, or housework. Get 7-8 hours of sleep each night. If  recommended by your health care provider, drink red wine in moderation. This means 1 glass a day for nonpregnant women and 2 glasses a day for men. A glass of wine equals 5 oz (150 mL). Reading food labels  Check the serving size of packaged foods. For foods such as rice and pasta, the serving size refers to the amount of cooked product, not dry. Check the total fat in packaged foods. Avoid foods that have saturated fat or trans fats. Check the ingredients list for added sugars, such as corn syrup. Shopping  At the grocery store, buy most of your food from the areas near the walls of the store. This includes: Fresh fruits and vegetables (produce). Grains, beans, nuts, and seeds. Some of these may be available in unpackaged forms or large amounts (in bulk). Fresh seafood. Poultry and eggs. Low-fat dairy products. Buy whole ingredients instead of prepackaged foods. Buy fresh fruits and vegetables in-season from local farmers markets. Buy frozen fruits and vegetables in resealable bags. If you do not have access to quality fresh seafood, buy precooked frozen shrimp or canned fish, such as tuna, salmon, or sardines. Buy small amounts of raw or cooked vegetables, salads, or olives from the deli or salad bar at your store. Stock your pantry so you always have certain foods on hand, such as olive oil, canned tuna, canned tomatoes, rice, pasta, and beans. Cooking  Cook foods with extra-virgin olive oil instead of  using butter or other vegetable oils. Have meat as a side dish, and have vegetables or grains as your main dish. This means having meat in small portions or adding small amounts of meat to foods like pasta or stew. Use beans or vegetables instead of meat in common dishes like chili or lasagna. Experiment with different cooking methods. Try roasting or broiling vegetables instead of steaming or sauteing them. Add frozen vegetables to soups, stews, pasta, or rice. Add nuts or seeds for added  healthy fat at each meal. You can add these to yogurt, salads, or vegetable dishes. Marinate fish or vegetables using olive oil, lemon juice, garlic, and fresh herbs. Meal planning  Plan to eat 1 vegetarian meal one day each week. Try to work up to 2 vegetarian meals, if possible. Eat seafood 2 or more times a week. Have healthy snacks readily available, such as: Vegetable sticks with hummus. Greek yogurt. Fruit and nut trail mix. Eat balanced meals throughout the week. This includes: Fruit: 2-3 servings a day Vegetables: 4-5 servings a day Low-fat dairy: 2 servings a day Fish, poultry, or lean meat: 1 serving a day Beans and legumes: 2 or more servings a week Nuts and seeds: 1-2 servings a day Whole grains: 6-8 servings a day Extra-virgin olive oil: 3-4 servings a day Limit red meat and sweets to only a few servings a month What are my food choices? Mediterranean diet Recommended Grains: Whole-grain pasta. Brown rice. Bulgar wheat. Polenta. Couscous. Whole-wheat bread. [place]. Vegetables: Artichokes. Beets. Broccoli. Cabbage. Carrots. Eggplant. Green beans. Chard. Kale. Spinach. Onions. Leeks. Peas. Squash. Tomatoes. Peppers. Radishes. Fruits: Apples. Apricots. Avocado. Berries. Bananas. Cherries. Dates. Figs. Grapes. Lemons. Melon. Oranges. Peaches. Plums. Pomegranate. Meats and other protein foods: Beans. Almonds. Sunflower seeds. Pine nuts. Peanuts. [place]. Salmon. Scallops. Shrimp. [place]. Tilapia. Clams. Oysters. Eggs. Dairy: Low-fat milk. Cheese. Greek yogurt. Beverages: Water. Red wine. Herbal tea. Fats and oils: Extra virgin olive oil. Avocado oil. Grape seed oil. Sweets and desserts: [place] yogurt with honey. Baked apples. Poached pears. Trail mix. Seasoning and other foods: Basil. Cilantro. Coriander. Cumin. Mint. Parsley. Sage. Rosemary. Tarragon. Garlic. Oregano. Thyme. Pepper. Balsalmic vinegar. Tahini. Hummus. Tomato sauce. Olives. Mushrooms. Limit these Grains:  Prepackaged pasta or rice dishes. Prepackaged cereal with added sugar. Vegetables: Deep fried potatoes (french fries). Fruits: Fruit canned in syrup. Meats and other protein foods: Beef. Pork. Lamb. Poultry with skin. Hot dogs. [place] Salines. Dairy: Ice cream. Sour cream. Whole milk. Beverages: Juice. Sugar-sweetened soft drinks. Beer. Liquor and spirits. Fats and oils: Butter. Canola oil. Vegetable oil. Beef fat (tallow). Lard. Sweets and desserts: Cookies. Cakes. Pies. Candy. Seasoning and other foods: Mayonnaise. Premade sauces and marinades. The items listed may not be a complete list. Talk with your dietitian about what dietary choices are right for you. Summary The Mediterranean diet includes both food and lifestyle choices. Eat a variety of fresh fruits and vegetables, beans, nuts, seeds, and whole grains. Limit the amount of red meat and sweets that you eat. Talk with your health care provider about whether it is safe for you to drink red wine in moderation. This means 1 glass a day for nonpregnant women and 2 glasses a day for men. A glass of wine equals 5 oz (150 mL). This information is not intended to replace advice given to you by your health care provider. Make sure you discuss any questions you have with your health care provider. Document Released: 08/01/2016 Document Revised: 09/03/2016 Document Reviewed: 08/01/2016 Elsevier Interactive Patient  Education  2017 [employer].

## 2023-02-27 ENCOUNTER — Ambulatory Visit: Payer: BC Managed Care – PPO | Admitting: Physician Assistant

## 2023-02-27 ENCOUNTER — Telehealth: Payer: Self-pay

## 2023-02-27 DIAGNOSIS — Z0279 Encounter for issue of other medical certificate: Secondary | ICD-10-CM

## 2023-02-27 NOTE — Telephone Encounter (Signed)
Received FMLA paperwork, payment has been taken and she will call back with a fax number.

## 2023-02-28 NOTE — Telephone Encounter (Addendum)
Daughter called in asking if we can provide a letter for the patients employer our turn around time for completion of paperwork. Daughter said she will pick it up.

## 2023-02-28 NOTE — Telephone Encounter (Signed)
Tried to call Lesly Rubenstein, no answer, letter is ready for pick up

## 2023-03-03 ENCOUNTER — Encounter: Payer: Self-pay | Admitting: Nurse Practitioner

## 2023-03-03 ENCOUNTER — Ambulatory Visit: Payer: BC Managed Care – PPO | Admitting: Nurse Practitioner

## 2023-03-03 VITALS — BP 132/72 | HR 91 | Temp 97.9°F | Ht 74.0 in | Wt 264.0 lb

## 2023-03-03 DIAGNOSIS — R0602 Shortness of breath: Secondary | ICD-10-CM | POA: Diagnosis not present

## 2023-03-03 DIAGNOSIS — R0683 Snoring: Secondary | ICD-10-CM | POA: Diagnosis not present

## 2023-03-03 DIAGNOSIS — J189 Pneumonia, unspecified organism: Secondary | ICD-10-CM

## 2023-03-03 DIAGNOSIS — R5383 Other fatigue: Secondary | ICD-10-CM | POA: Diagnosis not present

## 2023-03-03 MED ORDER — VITAMIN B-12 1000 MCG PO TABS: 1000.0000 ug | ORAL_TABLET | Freq: Every day | ORAL | 2 refills | Status: AC

## 2023-03-03 NOTE — Progress Notes (Signed)
I,Connor Burns,acting as a Education administrator for Connor Brine, FNP.,have documented all relevant documentation on the behalf of Connor Brine, FNP,as directed by  Connor Brine, FNP while in the presence of Connor Burns, [place].    Subjective:     Patient ID: Connor Burns. , male    DOB: 1956-07-15 , 67 y.o.   MRN: [id number]   Chief Complaint  Patient presents with   Shortness of Breath    HPI  Patient presents today for follow up on shortness of breath. He did complete antibiotics, but does not feel he has improved at all. He denies cough/wheeze or other concerns. He states he continues to have difficulty with shortness of breath with minimal exertion with walking up the stairs. His daughter reports he stated "I feel like God was going to take me out of here"   Everyday he wakes up his cheeks are swollen. He has done an ECHO with Cardiology in [place] and is planning to have a stress test Wednesday morning. He is to see Dr. Harriet Masson as a new patient on 03/17/2023. He continues to be active but has shortness of breath with stairs.   They are going out of town to [place] on Thursday.     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  He is to restart his Xarelto on Sunday night.   CAD (coronary artery disease)    CHF (congestive heart failure) 01/14/2017   Intermittent CHF with lower extremity edema.  I have given him Lasix to take on a p.r.n. basis.Formatting of this note might be different from the original. Last Assessment & Plan: Formatting of this note might be different from the original. Intermittent CHF with lower extremity edema.  I have  given him Las   Chronic kidney disease 10/27/2020   Stage II   Congestive cardiomyopathy 01/14/2017   Cardiomyopathy with LV dysfunction.Last ejection fraction 40-45% by echocardiography in 2012.Patient currently not taking beta-blocker Ace inhibitor for some reason.We will repeat echocardiography to evaluate LV function then determine the need for afterload reduction and beta-blocker therapy.[place]   Coronary atherosclerosis 01/14/2017   Status remote CABG in 2004 in [place].Left internal mammary artery to the obtuse marginal.Free Right internal mammary artery to the LAD.Saphenous vein graft to OM 2.Saphenous vein graft to the RCA.  Asymptomatic without chest pain or shortness of breath.Last nuclear stress test May 2018:  Large   DVT (deep venous thrombosis)    Dyslipidemia 05/07/2018   Hematuria 08/10/2015   Hypertension    ICD (implantable cardioverter-defibrillator) battery depletion 06/16/2018   Status post dual-chamber ICD generator change 06/16/2018.Last Assessment & Plan: Formatting of this note might be different from the original.Left infraclavicular incision intact with Dermabond.  There is no pocket hematoma present.  No drainage noted.Device interrogation this a.m. reveals appropriate device function.   Ischemic cardiomyopathy 06/18/2016   Echo 09/14/2011 showing LA 4.9cm, EF 40-45%.  Mild to moderate decreased LV systolic function.  Mild concentric LVH.  LA is moderately dilated.  Mild aortic cusp sclerosis. Aortic valve appears to be bicuspid.B. Concerning for potential dysrhythmia (ventricular tachycardia) especially given documented nonsustained ventricular tachy   Mild dementia, unclear etiology  12/30/2022   Myocardial infarction    Obesity (BMI 30-39.9) 08/10/2015   Onychomycosis 05/10/2014   Painful legs and moving toes of left foot 05/10/2014   Paroxysmal atrial fibrillation 08/08/2015   A. Paroxysmal.B. On Sotalol therapy.Last Assessment & Plan: History of present  atrial fibrillation.  Patient will 80 mg twice daily.Also on rivaroxaban 20 mg a day.Patient again refused an ECG.I explained to the patient, that the absence of an EKG, I canno   Peripheral vascular disease    Shortness of breath 10/27/2020   Exertional   Stroke 10/27/2020   Right frontal infarct per neuroimaging dating back at least to 2013   Syncope 01/14/2017   In the setting of ischemic cardiomyopathy. B. Concerning for potential dysrhythmia (ventricular tachycardia) especially given documented nonsustained ventricular tachycardia - EPS 9/25/201 showing reproducibly inducible nonsustained monomorphic VT up to 10 beat.  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 In the setting of ischemic cardiomyopathy. B. Concerning for potential dysrhythmia (ventricular tachycardia) especially given documented nonsustained ventricular tachycardia - EPS 9/25/201 showing reproducibly inducible nonsustained monomorphic VT up to 10 beat.  Reproducibly inducible nonsustained polymorphic VT.  Inducible VF wi     Family History  Problem Relation Age of Onset   Heart disease Mother        before age 85   Hyperlipidemia Mother    Hypertension Mother    Heart attack Mother    Hyperlipidemia Father    Hypertension Father    Memory loss Brother    Memory loss Brother    Memory loss Half-Sister    Memory loss Maternal Aunt      Current Outpatient Medications:    aspirin EC 81 MG tablet, Take 81 mg by mouth at bedtime. Swallow whole., Disp: , Rfl:    cyanocobalamin (VITAMIN B12) 1000 MCG tablet, Take 1 tablet (1,000 mcg total) by mouth daily., Disp: 30 tablet, Rfl: 2   cyclobenzaprine (FLEXERIL) 5 MG tablet, Take 1 tablet (5 mg total) by mouth at bedtime., Disp: 90 tablet, Rfl: 3   diclofenac (VOLTAREN) 75 MG EC tablet, Take 75 mg by mouth 2 (two) times daily., Disp: ,  Rfl:    donepezil (ARICEPT) 5 MG tablet, Take 1 tablet (5 mg total) by mouth daily., Disp: 30 tablet, Rfl: 0   furosemide (LASIX) 20 MG tablet, TAKE 1 TABLET BY MOUTH AS NEEDED FOR SWELLING, Disp: , Rfl:    lisinopril (PRINIVIL,ZESTRIL) 10 MG tablet, Take 10 mg by mouth daily., Disp: , Rfl:    oxybutynin (DITROPAN-XL) 10 MG 24 hr tablet, Take by mouth., Disp: , Rfl:    rosuvastatin (CRESTOR) 10 MG tablet, Take 1 tablet (10 mg total) by mouth every other day., Disp: 30 tablet, Rfl: 11   sotalol (BETAPACE) 80 MG tablet, Take 80 mg by mouth every evening., Disp: , Rfl:    XARELTO 20 MG TABS tablet, TAKE 1 TABLET BY MOUTH ONCE DAILY WITH SUPPER (Patient taking differently: Take 20 mg by mouth at bedtime.), Disp: 30 tablet, Rfl: 6   atorvastatin (LIPITOR) 20 MG tablet, Take by mouth. (Patient not taking: Reported on 02/20/2023), Disp: , Rfl:    colchicine 0.6 MG tablet, Take 0.6 mg by mouth 2 (two) times daily. (Patient not taking: Reported on 02/20/2023), Disp: , Rfl:    Allergies  Allergen Reactions   Clopidogrel Anaphylaxis and Other (See Comments)    "  Wierd, like out of body thing" Out of body experience    Plavix [Clopidogrel Bisulfate]      Review of Systems  Constitutional: Negative.   Respiratory:  Positive for shortness of breath (with exertion, started in January). Negative for cough and wheezing.   Cardiovascular: Negative.   Psychiatric/Behavioral: Negative.       Today's Vitals   03/03/23 0905  BP: 132/72  Pulse: 91  Temp: 97.9 F (36.6 C)  TempSrc: Oral  SpO2: 99%  Weight: 264 lb (119.7 kg)  Height: 6\' 2"  (1.88 m)   Body mass index is 33.9 kg/m.   Objective:  Physical Exam Vitals reviewed.  Constitutional:      General: He is not in acute distress.    Appearance: Normal appearance. He is obese.  HENT:     Head: Normocephalic.  Cardiovascular:     Rate and Rhythm: Normal rate and regular rhythm.     Pulses: Normal pulses.     Heart sounds: Normal heart sounds.  No murmur heard. Pulmonary:     Effort: Pulmonary effort is normal. No respiratory distress.     Breath sounds: Normal breath sounds. No wheezing.  Musculoskeletal:        General: Normal range of motion.  Skin:    General: Skin is warm and dry.     Capillary Refill: Capillary refill takes less than 2 seconds.  Neurological:     General: No focal deficit present.     Mental Status: He is alert and oriented to person, place, and time.  Psychiatric:        Mood and Affect: Mood normal.        Behavior: Behavior normal.        Thought Content: Thought content normal.        Judgment: Judgment normal.         Assessment And Plan:     1. Pneumonia of both lungs due to infectious organism, unspecified part of lung Comments: He is feeling better but still having shortness of breath with going up and down stairs. Will repeat CXR on 3/22. No abnormal findings on physical exam. - DG Chest 2 View; Future  2. Other fatigue Comments: Deconditioning vs sleep apnea vs cardiac. Will order PT and sleep study - cyanocobalamin (VITAMIN B12) 1000 MCG tablet; Take 1 tablet (1,000 mcg total) by mouth daily.  Dispense: 30 tablet; Refill: 2 - Ambulatory referral to Sleep Studies - Ambulatory referral to Physical Therapy  3. Snoring - Ambulatory referral to Sleep Studies  4. Shortness of breath Comments: SPO2 is normal, has shortness of breath on exertion - Ambulatory referral to Physical Therapy     Patient was given opportunity to ask questions. Patient verbalized understanding of the plan and was able to repeat key elements of the plan. All questions were answered to their satisfaction.  Connor Brine, FNP   I, Connor Brine, FNP, have reviewed all documentation for this visit. The documentation on 03/03/23 for the exam, diagnosis, procedures, and orders are all accurate and complete.   IF YOU HAVE BEEN REFERRED TO A SPECIALIST, IT MAY TAKE 1-2 WEEKS TO SCHEDULE/PROCESS THE REFERRAL. IF YOU  HAVE NOT HEARD FROM US/SPECIALIST IN TWO WEEKS, PLEASE GIVE [place] A CALL AT [phone].   THE PATIENT IS ENCOURAGED TO PRACTICE SOCIAL DISTANCING DUE TO THE COVID-19 PANDEMIC.

## 2023-03-04 ENCOUNTER — Telehealth: Payer: Self-pay

## 2023-03-04 NOTE — Telephone Encounter (Signed)
[name], Stanton Kidney ([place]), called to report patient has increased edema to BLE, abdomen (R>L) and R arm all the way to his hand. Spoke with patient and he denies any increased shob or other symptoms/concerns. Per chart patient is on Furosemide '20mg'$  PRN, wife reports he is actually on '40mg'$  QD prescribed by Dr. Dorthy Cooler (former PCP).   Per Doreene Burke she recommends they contact cardiologist for recommendations and possible treatment change. Explained this to patient/wife. She states they will contact cardiology office today. She also reports they were advised recent Echo came back as normal.   Daneil Dan to contact our office if she is not able to get assistance from cardiology.

## 2023-03-06 ENCOUNTER — Ambulatory Visit: Payer: BC Managed Care – PPO | Admitting: Nurse Practitioner

## 2023-03-13 DIAGNOSIS — R531 Weakness: Secondary | ICD-10-CM | POA: Insufficient documentation

## 2023-03-17 ENCOUNTER — Ambulatory Visit: Payer: BC Managed Care – PPO | Admitting: Cardiology

## 2023-03-17 ENCOUNTER — Telehealth: Payer: Self-pay

## 2023-03-17 NOTE — Telephone Encounter (Signed)
Called pt to reschedule appointment. No answer, voicemail full, unable to leave message to return call.

## 2023-03-17 NOTE — Telephone Encounter (Signed)
Called pt, no answer. Unable to leave message.  

## 2023-03-18 ENCOUNTER — Ambulatory Visit: Payer: BC Managed Care – PPO | Admitting: Nurse Practitioner

## 2023-03-19 ENCOUNTER — Telehealth: Payer: Self-pay

## 2023-03-19 NOTE — Telephone Encounter (Signed)
Have attempted to reach pt/spouse/daughter on all available phone numbers. All vms are full, unable to leave message.

## 2023-03-20 ENCOUNTER — Ambulatory Visit: Payer: BC Managed Care – PPO | Attending: Cardiovascular Disease | Admitting: Cardiology

## 2023-03-20 ENCOUNTER — Encounter: Payer: Self-pay | Admitting: Cardiology

## 2023-03-20 VITALS — BP 132/74 | HR 84 | Ht 74.0 in | Wt 270.2 lb

## 2023-03-20 DIAGNOSIS — I2510 Atherosclerotic heart disease of native coronary artery without angina pectoris: Secondary | ICD-10-CM | POA: Diagnosis not present

## 2023-03-20 DIAGNOSIS — Z951 Presence of aortocoronary bypass graft: Secondary | ICD-10-CM

## 2023-03-20 DIAGNOSIS — R0609 Other forms of dyspnea: Secondary | ICD-10-CM | POA: Diagnosis not present

## 2023-03-20 DIAGNOSIS — Z95810 Presence of automatic (implantable) cardiac defibrillator: Secondary | ICD-10-CM

## 2023-03-20 DIAGNOSIS — R0602 Shortness of breath: Secondary | ICD-10-CM | POA: Diagnosis not present

## 2023-03-20 DIAGNOSIS — I480 Paroxysmal atrial fibrillation: Secondary | ICD-10-CM

## 2023-03-20 DIAGNOSIS — I255 Ischemic cardiomyopathy: Secondary | ICD-10-CM

## 2023-03-20 DIAGNOSIS — E669 Obesity, unspecified: Secondary | ICD-10-CM

## 2023-03-20 DIAGNOSIS — N183 Chronic kidney disease, stage 3 unspecified: Secondary | ICD-10-CM

## 2023-03-20 DIAGNOSIS — R0989 Other specified symptoms and signs involving the circulatory and respiratory systems: Secondary | ICD-10-CM

## 2023-03-20 DIAGNOSIS — I5020 Unspecified systolic (congestive) heart failure: Secondary | ICD-10-CM

## 2023-03-20 NOTE — Patient Instructions (Signed)
Medication Instructions:  TAKE Lasix (Furosemide) 40 MG twice daily for 5 days, then TAKE 40 MG (One Tablet) daily.  Metolazone 2.5 MG 30 minutes before taking lasix for two days.  *If you need a refill on your cardiac medications before your next appointment, please call your pharmacy*   Lab Work: Your physician recommends that lab work today: CMET Mg2+ BNP CBC  If you have labs (blood work) drawn today and your tests are completely normal, you will receive your results only by: MyChart Message (if you have MyChart) OR A paper copy in the mail If you have any lab test that is abnormal or we need to change your treatment, we will call you to review the results.   Testing/Procedures: None ordered  Follow-Up: At [facility], you and your health needs are our priority.  As part of our continuing mission to provide you with exceptional heart care, we have created designated Provider Care Teams.  These Care Teams include your primary Cardiologist (physician) and Advanced Practice Providers (APPs -  Physician Assistants and Nurse Practitioners) who all work together to provide you with the care you need, when you need it.  We recommend signing up for the patient portal called "MyChart".  Sign up information is provided on this After Visit Summary.  MyChart is used to connect with patients for Virtual Visits (Telemedicine).  Patients are able to view lab/test results, encounter notes, upcoming appointments, etc.  Non-urgent messages can be sent to your provider as well.   To learn more about what you can do with MyChart, go to [email or web address].    Your next appointment:   2 week(s)  Provider:   Berniece Salines, DO     Other Instructions You have been referred to Cardiac Electrophysiology

## 2023-03-21 LAB — BASIC METABOLIC PANEL
BUN/Creatinine Ratio: 8 — ABNORMAL LOW (ref 10–24)
BUN: 19 mg/dL (ref 8–27)
CO2: 21 mmol/L (ref 20–29)
Calcium: 9.1 mg/dL (ref 8.6–10.2)
Chloride: 100 mmol/L (ref 96–106)
Creatinine, Ser: 2.38 mg/dL — ABNORMAL HIGH (ref 0.76–1.27)
Glucose: 81 mg/dL (ref 70–99)
Potassium: 4.2 mmol/L (ref 3.5–5.2)
Sodium: 140 mmol/L (ref 134–144)
eGFR: 29 mL/min/{1.73_m2} — ABNORMAL LOW (ref >59)

## 2023-03-21 LAB — CBC
Hematocrit: 35.5 % — ABNORMAL LOW (ref 37.5–51.0)
Hemoglobin: 11.1 g/dL — ABNORMAL LOW (ref 13.0–17.7)
MCH: 24 pg — ABNORMAL LOW (ref 26.6–33.0)
MCHC: 31.3 g/dL — ABNORMAL LOW (ref 31.5–35.7)
MCV: 77 fL — ABNORMAL LOW (ref 79–97)
Platelets: 207 10*3/uL (ref 150–450)
RBC: 4.62 x10E6/uL (ref 4.14–5.80)
RDW: 15.9 % — ABNORMAL HIGH (ref 11.6–15.4)
WBC: 5 10*3/uL (ref 3.4–10.8)

## 2023-03-21 LAB — BRAIN NATRIURETIC PEPTIDE: BNP: 362.8 pg/mL — ABNORMAL HIGH (ref 0.0–100.0)

## 2023-03-21 LAB — MAGNESIUM: Magnesium: 2 mg/dL (ref 1.6–2.3)

## 2023-03-23 NOTE — Progress Notes (Signed)
Cardiology Office Note:    Date:  03/23/2023   ID:  Connor Drone., DOB 1956-01-11, MRN [id number]  PCP:  [name], FNP  Cardiologist:  Berniece Salines, DO  Electrophysiologist:  None   Referring MD: Lujean Amel, MD   " I am changing cardiologist"  History of Present Illness:    Connor Spada. is a 67 y.o. male with a hx of coronary artery disease status post coronary artery bypass grafting, ischemic cardiomyopathy EF 30 to 35% on echocardiogram which was done in March 12, 2039, status post ICD placement, hyperlipidemia, atrial fibrillation unclear if this is permanent or still persistent on Xarelto, morbid obesity.  He tells me he is transitioning his care from [place] due to distance.  He notes that his daughter had encouraged him to transition to a new cardiologist since moving to [place].  His main complaint today is the fact that he is significantly short of breath.  He offers no complaints at this time.  No chest pain.  Chart reviewed today as noted that he had a stress test at [facility]/a nuclear stress test.  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  He is to restart his Xarelto on Sunday night.   CAD (coronary artery disease)    CHF (congestive heart failure) 01/14/2017   Intermittent CHF with lower extremity edema.  I have given him Lasix to take on a p.r.n. basis.Formatting of this note might be different from the original. Last Assessment & Plan: Formatting of this note might be different from the original. Intermittent CHF with lower extremity edema.  I have given him Las   Chronic kidney disease  10/27/2020   Stage II   Congestive cardiomyopathy 01/14/2017   Cardiomyopathy with LV dysfunction.Last ejection fraction 40-45% by echocardiography in 2012.Patient currently not taking beta-blocker Ace inhibitor for some reason.We will repeat echocardiography to evaluate LV function then determine the need for afterload reduction and beta-blocker therapy.[place]   Coronary atherosclerosis 01/14/2017   Status remote CABG in 2004 in [place].Left internal mammary artery to the obtuse marginal.Free Right internal mammary artery to the LAD.Saphenous vein graft to OM 2.Saphenous vein graft to the RCA.  Asymptomatic without chest pain or shortness of breath.Last nuclear stress test May 2018:  Large   DVT (deep venous thrombosis)    Dyslipidemia 05/07/2018   Hematuria 08/10/2015   Hypertension    ICD (implantable cardioverter-defibrillator) battery depletion 06/16/2018   Status post dual-chamber ICD generator change 06/16/2018.Last Assessment & Plan: Formatting of this note might be different from the original.Left infraclavicular incision intact with Dermabond.  There is no pocket hematoma present.  No drainage noted.Device interrogation this a.m. reveals appropriate device function.   Ischemic cardiomyopathy 06/18/2016   Echo 09/14/2011 showing LA 4.9cm, EF 40-45%.  Mild to moderate decreased LV systolic function.  Mild concentric LVH.  LA is moderately dilated.  Mild aortic cusp sclerosis. Aortic valve appears to be bicuspid.B. Concerning for potential dysrhythmia (ventricular tachycardia) especially given documented nonsustained ventricular tachy   Mild dementia, unclear etiology 12/30/2022   Myocardial infarction    Obesity (BMI 30-39.9) 08/10/2015   Onychomycosis 05/10/2014   Painful legs and  moving toes of left foot 05/10/2014   Paroxysmal atrial fibrillation 08/08/2015   A. Paroxysmal.B. On Sotalol therapy.Last Assessment & Plan: History of present atrial fibrillation.  Patient will 80 mg  twice daily.Also on rivaroxaban 20 mg a day.Patient again refused an ECG.I explained to the patient, that the absence of an EKG, I canno   Peripheral vascular disease    Shortness of breath 10/27/2020   Exertional   Stroke 10/27/2020   Right frontal infarct per neuroimaging dating back at least to 2013   Syncope 01/14/2017   In the setting of ischemic cardiomyopathy. B. Concerning for potential dysrhythmia (ventricular tachycardia) especially given documented nonsustained ventricular tachycardia - EPS 9/25/201 showing reproducibly inducible nonsustained monomorphic VT up to 10 beat.  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 In the setting of ischemic cardiomyopathy. B. Concerning for potential dysrhythmia (ventricular tachycardia) especially given documented nonsustained ventricular tachycardia - EPS 9/25/201 showing reproducibly inducible nonsustained monomorphic VT up to 10 beat.  Reproducibly inducible nonsustained polymorphic VT.  Inducible VF wi    Past Surgical History:  Procedure Laterality Date   CARDIAC DEFIBRILLATOR PLACEMENT     CORONARY ARTERY BYPASS GRAFT  10/2003   IR RADIOLOGIST EVAL & MGMT  10/26/2020   PR VEIN BYPASS GRAFT,AORTO-FEM-POP      Current Medications: Current Meds  Medication Sig   cyanocobalamin (VITAMIN B12) 1000 MCG tablet Take 1 tablet (1,000 mcg total) by mouth daily.   donepezil (ARICEPT) 5 MG tablet Take 1 tablet (5 mg total) by mouth daily.   furosemide (LASIX) 20 MG tablet TAKE 1 TABLET BY MOUTH AS NEEDED FOR SWELLING   oxybutynin (DITROPAN-XL) 10 MG 24 hr tablet Take by mouth.   XARELTO 20 MG TABS tablet TAKE 1 TABLET BY MOUTH ONCE DAILY WITH SUPPER (Patient taking differently: Take 20 mg by mouth at bedtime.)     Allergies:   Clopidogrel and Plavix [clopidogrel bisulfate]   Social History   Socioeconomic  History   Marital status: Married    Spouse name: Not on file   Number of children: Not on file   Years of education: 18   Highest education level: Master's degree (e.g., MA, MS, MEng, MEd, MSW, MBA)  Occupational History   Occupation: Chaplain    Comment: [employer]  Tobacco Use   Smoking status: Never   Smokeless tobacco: Never  Vaping Use   Vaping Use: Never used  Substance and Sexual Activity   Alcohol use: No   Drug use: No   Sexual activity: Not on file  Other Topics Concern   Not on file  Social History Narrative   Lives with wife in a 2 story home.  Has 3 children.     Works as a Research scientist (medical).     Education: Masters in [employer].   Right handed   Caffeine yes   Social Determinants of Health   Financial Resource Strain: Not on file  Food Insecurity: Not on file  Transportation Needs: Not on file  Physical Activity: Not on file  Stress: Not on file  Social Connections: Not on file     Family History: The patient's family history includes Heart attack in his mother; Heart disease in his mother; Hyperlipidemia in his father and mother; Hypertension in his father and mother; Memory loss in his brother, brother, half-sister, and maternal aunt.  ROS:  Review of Systems  Constitution: Negative for decreased appetite, fever and weight gain.  HENT: Negative for congestion, ear discharge, hoarse voice and sore throat.   Eyes: Negative for discharge, redness, vision loss in right eye and visual halos.  Cardiovascular: Negative for chest pain, dyspnea on exertion, leg swelling, orthopnea and palpitations.  Respiratory: Negative for cough, hemoptysis, shortness of breath and snoring.   Endocrine: Negative for heat intolerance and polyphagia.  Hematologic/Lymphatic: Negative for bleeding problem. Does not bruise/bleed easily.  Skin: Negative for flushing, nail changes, rash and suspicious lesions.  Musculoskeletal: Negative for arthritis, joint pain, muscle cramps,  myalgias, neck pain and stiffness.  Gastrointestinal: Negative for abdominal pain, bowel incontinence, diarrhea and excessive appetite.  Genitourinary: Negative for decreased libido, genital sores and incomplete emptying.  Neurological: Negative for brief paralysis, focal weakness, headaches and loss of balance.  Psychiatric/Behavioral: Negative for altered mental status, depression and suicidal ideas.  Allergic/Immunologic: Negative for HIV exposure and persistent infections.    EKGs/Labs/Other Studies Reviewed:    The following studies were reviewed today:   EKG:  The ekg ordered today demonstrates paroxysmal atrial fibrillation, heart rate 84 bpm with premature ventricular complex.  Tte 02/25/2023 Summary   Left Ventricle: There is paradoxical septal motion secondary to CABG. The estimated ejection fraction is 30-35%. There is mild concentric LV hypertrophy visualized.   Right Ventricle: ICD present. Cannot determine RV function..   Aortic Valve: There is no evidence of aortic stenosis. There is no evidence of aortic regurgitation.   Mitral Valve: Mild mitral regurgitation is visualized.   Pericardium: There is no evidence of a pericardial effusion.   Recent Labs: 02/11/2023: TSH 2.670 03/20/2023: BNP 362.8; BUN 19; Creatinine, Ser 2.38; Hemoglobin 11.1; Magnesium 2.0; Platelets 207; Potassium 4.2; Sodium 140  Recent Lipid Panel    Component Value Date/Time   CHOL 135 08/11/2017 1647   TRIG 68 08/11/2017 1647   HDL 38 (L) 08/11/2017 1647   CHOLHDL 3.6 08/11/2017 1647   LDLCALC 83 08/11/2017 1647    Physical Exam:    VS:  BP 132/74 (BP Location: Left Arm, Patient Position: Sitting, Cuff Size: Large)   Pulse 84   Ht 6\' 2"  (1.88 m)   Wt 270 lb 3.2 oz (122.6 kg)   BMI 34.69 kg/m     Wt Readings from Last 3 Encounters:  03/20/23 270 lb 3.2 oz (122.6 kg)  03/03/23 264 lb (119.7 kg)  02/11/23 262 lb (118.8 kg)     GEN: Well nourished, well developed in no acute  distress HEENT: Normal NECK: No JVD; No carotid bruits LYMPHATICS: No lymphadenopathy CARDIAC: S1S2 noted,RRR, no murmurs, rubs, gallops RESPIRATORY:  Clear to auscultation without rales, wheezing or rhonchi  ABDOMEN: Soft, non-tender, non-distended, +bowel sounds, no guarding. EXTREMITIES: +3 bilateral edema, No cyanosis, no clubbing MUSCULOSKELETAL:  No deformity  SKIN: Warm and dry NEUROLOGIC:  Alert and oriented x 3, non-focal PSYCHIATRIC:  Normal affect, good insight  ASSESSMENT:    1. Shortness of breath   2. Coronary artery disease involving native coronary artery of native heart, unspecified whether angina present   3. Dyspnea on exertion   4. PAF (paroxysmal atrial fibrillation) ([place])   5. AICD (automatic cardioverter/defibrillator) present   6. History of coronary artery bypass graft   7. Obesity (BMI 30-39.9)   8. Ischemic cardiomyopathy   9. Depressed left ventricular ejection fraction   10. HFrEF (heart failure with reduced ejection fraction) ([place])   11. Stage 3 chronic kidney disease, unspecified whether stage  3a or 3b CKD ([place])    PLAN:    He is experiencing significant shortness of breath on exertion.  In addition he does have significant fluid overload will like to do is increase his Lasix to 40 mg twice daily for 5 days then 40 mg daily.  Will also give metolazone 2.5 mg for 2 days.  From hoping that we can be able to diurese the patient effectively.  Will also get blood work today for kidney function as well as BNP.  I reviewed his echocardiogram which was done on February 25, 2023 showing EF of 30 to 35% no significant valvular heart.  He has pending stress test/Lexiscan which was done today at his previous cardiologist office will follow-up with this testing if there is any need for further ischemic evaluation.  A-fib today, he is currently on Xarelto, rate is controlled he is on sotalol.  He may benefit from attempting cardioversion for rhythm control strategy but  will hold off for now as there are multiple things going on.  First I will like to diurese him and then assess if there is any need to pursue further ischemic evaluation patient i.e. heart catheterization from his [place].  Will have him establish with our EP colleagues  due to  his AICD. He prefers to see EP in [place] so we will set him up with Dr. Curt Bears.  The patient understands the need to lose weight with diet and exercise. We have discussed specific strategies for this. The patient is in agreement with the above plan. The patient left the office in stable condition.  The patient will follow up in 2 weeks or sooner if needed.   Medication Adjustments/Labs and Tests Ordered: Current medicines are reviewed at length with the patient today.  Concerns regarding medicines are outlined above.  Orders Placed This Encounter  Procedures   Basic Metabolic Panel (BMET)   B Nat Peptide   Magnesium   CBC   Ambulatory referral to Cardiac Electrophysiology   EKG 12-Lead   No orders of the defined types were placed in this encounter.   Patient Instructions  Medication Instructions:  TAKE Lasix (Furosemide) 40 MG twice daily for 5 days, then TAKE 40 MG (One Tablet) daily.  Metolazone 2.5 MG 30 minutes before taking lasix for two days.  *If you need a refill on your cardiac medications before your next appointment, please call your pharmacy*   Lab Work: Your physician recommends that lab work today: CMET Mg2+ BNP CBC  If you have labs (blood work) drawn today and your tests are completely normal, you will receive your results only by: MyChart Message (if you have MyChart) OR A paper copy in the mail If you have any lab test that is abnormal or we need to change your treatment, we will call you to review the results.   Testing/Procedures: None ordered  Follow-Up: At [facility], you and your health needs are our priority.  As part of our continuing mission to provide  you with exceptional heart care, we have created designated Provider Care Teams.  These Care Teams include your primary Cardiologist (physician) and Advanced Practice Providers (APPs -  Physician Assistants and Nurse Practitioners) who all work together to provide you with the care you need, when you need it.  We recommend signing up for the patient portal called "MyChart".  Sign up information is provided on this After Visit Summary.  MyChart is used to connect with patients for Virtual Visits (Telemedicine).  Patients are able to view lab/test results, encounter notes, upcoming appointments, etc.  Non-urgent messages can be sent to your provider as well.   To learn more about what you can do with MyChart, go to [email or web address].    Your next appointment:   2 week(s)  Provider:   Berniece Salines, DO     Other Instructions You have been referred to Cardiac Electrophysiology    Adopting a Healthy Lifestyle.  Know what a healthy weight is for you (roughly BMI <25) and aim to maintain this   Aim for 7+ servings of fruits and vegetables daily   65-80+ fluid ounces of water or unsweet tea for healthy kidneys   Limit to max 1 drink of alcohol per day; avoid smoking/tobacco   Limit animal fats in diet for cholesterol and heart health - choose grass fed whenever available   Avoid highly processed foods, and foods high in saturated/trans fats   Aim for low stress - take time to unwind and care for your mental health   Aim for 150 min of moderate intensity exercise weekly for heart health, and weights twice weekly for bone health   Aim for 7-9 hours of sleep daily   When it comes to diets, agreement about the perfect plan isnt easy to find, even among the experts. Experts at the [place] developed an idea known as the Healthy Eating Plate. Just imagine a plate divided into logical, healthy portions.   The emphasis is on diet quality:   Load up on vegetables and  fruits - one-half of your plate: Aim for color and variety, and remember that potatoes dont count.   Go for whole grains - one-quarter of your plate: Whole wheat, barley, wheat berries, quinoa, oats, brown rice, and foods made with them. If you want pasta, go with whole wheat pasta.   Protein power - one-quarter of your plate: Fish, chicken, beans, and nuts are all healthy, versatile protein sources. Limit red meat.   The diet, however, does go beyond the plate, offering a few other suggestions.   Use healthy plant oils, such as olive, canola, soy, corn, sunflower and peanut. Check the labels, and avoid partially hydrogenated oil, which have unhealthy trans fats.   If youre thirsty, drink water. Coffee and tea are good in moderation, but skip sugary drinks and limit milk and dairy products to one or two daily servings.   The type of carbohydrate in the diet is more important than the amount. Some sources of carbohydrates, such as vegetables, fruits, whole grains, and beans-are healthier than others.   Finally, stay active  Rolly Pancake, DO  03/23/2023 8:43 PM    [facility]

## 2023-03-24 ENCOUNTER — Telehealth: Payer: Self-pay | Admitting: Cardiology

## 2023-03-24 MED ORDER — METOLAZONE 2.5 MG PO TABS: 2.5000 mg | ORAL_TABLET | Freq: Every day | ORAL | 0 refills | Status: DC

## 2023-03-24 MED ORDER — FUROSEMIDE 40 MG PO TABS: ORAL_TABLET | ORAL | 0 refills | Status: DC

## 2023-03-24 NOTE — Telephone Encounter (Signed)
LVM for patient wife to return call to office. Per Dr Harriet Masson last OV note TAKE Lasix (Furosemide) 40 MG twice daily for 5 days, then TAKE 40 MG (One Tablet) daily.   Metolazone 2.5 MG 30 minutes before taking lasix for two days. Medication sent to pharmacy.

## 2023-03-24 NOTE — Telephone Encounter (Signed)
Pt c/o medication issue:  1. Name of Medication:   TAKE Lasix (Furosemide) 40 MG twice daily for 5 days, then TAKE 40 MG (One Tablet) daily.   Metolazone 2.5 MG 30 minutes before taking lasix for two days.  2. How are you currently taking this medication (dosage and times per day)?   3. Are you having a reaction (difficulty breathing--STAT)?   4. What is your medication issue? Pt spouse calling in stating pt was supposed to have this meds added to his med list and sent to pharmacy but they have not received yet. Please advise

## 2023-04-03 ENCOUNTER — Encounter: Payer: Self-pay | Admitting: Cardiology

## 2023-04-03 ENCOUNTER — Ambulatory Visit: Payer: BC Managed Care – PPO | Attending: Cardiology | Admitting: Cardiology

## 2023-04-03 VITALS — BP 118/74 | HR 93 | Ht 74.0 in | Wt 257.2 lb

## 2023-04-03 DIAGNOSIS — I5022 Chronic systolic (congestive) heart failure: Secondary | ICD-10-CM

## 2023-04-03 DIAGNOSIS — I10 Essential (primary) hypertension: Secondary | ICD-10-CM

## 2023-04-03 DIAGNOSIS — I255 Ischemic cardiomyopathy: Secondary | ICD-10-CM

## 2023-04-03 DIAGNOSIS — E669 Obesity, unspecified: Secondary | ICD-10-CM

## 2023-04-03 DIAGNOSIS — Z79899 Other long term (current) drug therapy: Secondary | ICD-10-CM

## 2023-04-03 DIAGNOSIS — I4821 Permanent atrial fibrillation: Secondary | ICD-10-CM

## 2023-04-03 DIAGNOSIS — E785 Hyperlipidemia, unspecified: Secondary | ICD-10-CM

## 2023-04-03 DIAGNOSIS — R0989 Other specified symptoms and signs involving the circulatory and respiratory systems: Secondary | ICD-10-CM

## 2023-04-03 MED ORDER — FUROSEMIDE 40 MG PO TABS: ORAL_TABLET | ORAL | 3 refills | Status: DC

## 2023-04-03 MED ORDER — METOLAZONE 2.5 MG PO TABS: 2.5000 mg | ORAL_TABLET | ORAL | 3 refills | Status: DC

## 2023-04-03 NOTE — Patient Instructions (Signed)
Medication Instructions:  Your physician has recommended you make the following change in your medication:  Lasix 40 mg once Tuesday, Thursday, Saturday and Sunday Lasix 40 mg twice Monday, Wednesday and Friday Metolazone 2.5 mg once weekly, 30 minutes before Lasix.  *If you need a refill on your cardiac medications before your next appointment, please call your pharmacy*   Lab Work: Your physician recommends that you have labs drawn today: BMET, Mag If you have labs (blood work) drawn today and your tests are completely normal, you will receive your results only by: MyChart Message (if you have MyChart) OR A paper copy in the mail If you have any lab test that is abnormal or we need to change your treatment, we will call you to review the results.   Testing/Procedures: None   Follow-Up: At [facility], you and your health needs are our priority.  As part of our continuing mission to provide you with exceptional heart care, we have created designated Provider Care Teams.  These Care Teams include your primary Cardiologist (physician) and Advanced Practice Providers (APPs -  Physician Assistants and Nurse Practitioners) who all work together to provide you with the care you need, when you need it.  We recommend signing up for the patient portal called "MyChart".  Sign up information is provided on this After Visit Summary.  MyChart is used to connect with patients for Virtual Visits (Telemedicine).  Patients are able to view lab/test results, encounter notes, upcoming appointments, etc.  Non-urgent messages can be sent to your provider as well.   To learn more about what you can do with MyChart, go to [email or web address].    Your next appointment:   2 week(s)  Provider:   Thomasene Ripple, DO

## 2023-04-04 LAB — BASIC METABOLIC PANEL
BUN/Creatinine Ratio: 8 — ABNORMAL LOW (ref 10–24)
BUN: 20 mg/dL (ref 8–27)
CO2: 28 mmol/L (ref 20–29)
Calcium: 9.1 mg/dL (ref 8.6–10.2)
Chloride: 97 mmol/L (ref 96–106)
Creatinine, Ser: 2.44 mg/dL — ABNORMAL HIGH (ref 0.76–1.27)
Glucose: 88 mg/dL (ref 70–99)
Potassium: 3.6 mmol/L (ref 3.5–5.2)
Sodium: 140 mmol/L (ref 134–144)
eGFR: 28 mL/min/{1.73_m2} — ABNORMAL LOW (ref >59)

## 2023-04-04 LAB — MAGNESIUM: Magnesium: 1.9 mg/dL (ref 1.6–2.3)

## 2023-04-04 NOTE — Progress Notes (Unsigned)
Cardiology Office Note:    Date:  04/05/2023   ID:  Connor Burns., DOB 02/20/1956, MRN [id number]  PCP:  [name], FNP  Cardiologist:  Thomasene Ripple, DO  Electrophysiologist:  None   Referring MD: [name], FNP   " I am changing cardiologist"  History of Present Illness:    Connor Burns. is a 67 y.o. male with a hx of coronary artery disease status post coronary artery bypass grafting, ischemic cardiomyopathy EF 30 to 35% on echocardiogram which was done in March 12, 2039, status post ICD placement, hyperlipidemia, atrial fibrillation unclear if this is permanent or still persistent on Xarelto, morbid obesity.  During his first visit he was transitioning his care from [facility] due to distance.  At our initial visit he was experiencing shortness of breath, with significant leg swelling. I increased his Lasix and added metolazone for 2 days.   Prior to his visit with me he did a nuclear stress the same day and the results were not up by the time of my visit with him.   In the interim he took his the adjusted dose of Lasix and metolazone- here for a follow up. He reports improved shortness of breath but still with leg swelling.  No other compliants at this time.  Past Surgical History:  Procedure Laterality Date   CARDIAC DEFIBRILLATOR PLACEMENT     CORONARY ARTERY BYPASS GRAFT  10/2003   IR RADIOLOGIST EVAL & MGMT  10/26/2020   PR VEIN BYPASS GRAFT,AORTO-FEM-POP      Current Medications: Current Meds  Medication Sig   aspirin EC 81 MG tablet Take 81 mg by mouth at bedtime. Swallow whole.   cyanocobalamin (VITAMIN B12) 1000 MCG tablet Take 1 tablet (1,000 mcg total) by mouth daily.   furosemide (LASIX) 40 MG tablet Lasix 40 mg once Tuesday, Thursday, Saturday and Sunday. Lasix 40 mg twice Monday, Wednesday and Friday   metolazone (ZAROXOLYN) 2.5 MG tablet Take 1 tablet (2.5 mg total) by mouth once a week. Take 30 minutes before Lasix.   sildenafil (REVATIO) 20  MG tablet SMARTSIG:2-5 Tablet(s) By Mouth PRN   XARELTO 20 MG TABS tablet TAKE 1 TABLET BY MOUTH ONCE DAILY WITH SUPPER (Patient taking differently: Take 20 mg by mouth at bedtime.)   [DISCONTINUED] furosemide (LASIX) 40 MG tablet TAKE Lasix (Furosemide) 40 MG twice daily for 5 days, then TAKE 40 MG (One Tablet) daily.     Allergies:   Clopidogrel and Plavix [clopidogrel bisulfate]   Social History   Socioeconomic History   Marital status: Married    Spouse name: Not on file   Number of children: Not on file   Years of education: 42   Highest education level: Master's degree (e.g., MA, MS, MEng, MEd, MSW, MBA)  Occupational History   Occupation: Chaplain    Comment: [employer]  Tobacco Use   Smoking status: Never   Smokeless tobacco: Never  Vaping Use   Vaping Use: Never used  Substance and Sexual Activity   Alcohol use: No   Drug use: No   Sexual activity: Not on file  Other Topics Concern   Not on file  Social History Narrative   Lives with wife in a 2 story home.  Has 3 children.     Works as a Metallurgist.     Education: Masters in [employer].   Right handed   Caffeine yes   Social Determinants of Health   Financial Resource Strain: Not on  file  Food Insecurity: Not on file  Transportation Needs: Not on file  Physical Activity: Not on file  Stress: Not on file  Social Connections: Not on file     Family History: The patient's family history includes Heart attack in his mother; Heart disease in his mother; Hyperlipidemia in his father and mother; Hypertension in his father and mother; Memory loss in his brother, brother, half-sister, and maternal aunt.  ROS:   Review of Systems  Constitution: Negative for decreased appetite, fever and weight gain.  HENT: Negative for congestion, ear discharge, hoarse voice and sore throat.   Eyes: Negative for discharge, redness, vision loss in right eye and visual halos.  Cardiovascular: Negative for chest pain, dyspnea  on exertion, leg swelling, orthopnea and palpitations.  Respiratory: Negative for cough, hemoptysis, shortness of breath and snoring.   Endocrine: Negative for heat intolerance and polyphagia.  Hematologic/Lymphatic: Negative for bleeding problem. Does not bruise/bleed easily.  Skin: Negative for flushing, nail changes, rash and suspicious lesions.  Musculoskeletal: Negative for arthritis, joint pain, muscle cramps, myalgias, neck pain and stiffness.  Gastrointestinal: Negative for abdominal pain, bowel incontinence, diarrhea and excessive appetite.  Genitourinary: Negative for decreased libido, genital sores and incomplete emptying.  Neurological: Negative for brief paralysis, focal weakness, headaches and loss of balance.  Psychiatric/Behavioral: Negative for altered mental status, depression and suicidal ideas.  Allergic/Immunologic: Negative for HIV exposure and persistent infections.    EKGs/Labs/Other Studies Reviewed:    The following studies were reviewed today:   EKG:  none today  03/20/2023 IMPRESSION  1. Nondiagnostic ECG response to vasodilator stress due to ECG abnormalities.  2. Markedly abnormal myocardial perfusion scan with a large area of infarction in the inferior wall without reversible ischemia.  3. Severe LV dysfunction with an ejection fraction of 23% with segmental wall motion abnormalities.   This is a low risk study for ischemic events.  This is a very high risk study for congestive heart failure and possibly ventricular arrhythmias.   Tte 02/25/2023 Summary   Left Ventricle: There is paradoxical septal motion secondary to CABG. The estimated ejection fraction is 30-35%. There is mild concentric LV hypertrophy visualized.   Right Ventricle: ICD present. Cannot determine RV function..   Aortic Valve: There is no evidence of aortic stenosis. There is no evidence of aortic regurgitation.   Mitral Valve: Mild mitral regurgitation is visualized.   Pericardium: There  is no evidence of a pericardial effusion.   Recent Labs: 02/11/2023: TSH 2.670 03/20/2023: BNP 362.8; Hemoglobin 11.1; Platelets 207 04/03/2023: BUN 20; Creatinine, Ser 2.44; Magnesium 1.9; Potassium 3.6; Sodium 140  Recent Lipid Panel    Component Value Date/Time   CHOL 135 08/11/2017 1647   TRIG 68 08/11/2017 1647   HDL 38 (L) 08/11/2017 1647   CHOLHDL 3.6 08/11/2017 1647   LDLCALC 83 08/11/2017 1647    Physical Exam:    VS:  BP 118/74   Pulse 93   Ht 6\' 2"  (1.88 m)   Wt 257 lb 3.2 oz (116.7 kg)   SpO2 98%   BMI 33.02 kg/m     Wt Readings from Last 3 Encounters:  04/03/23 257 lb 3.2 oz (116.7 kg)  03/20/23 270 lb 3.2 oz (122.6 kg)  03/03/23 264 lb (119.7 kg)     GEN: Well nourished, well developed in no acute distress HEENT: Normal NECK: No JVD; No carotid bruits LYMPHATICS: No lymphadenopathy CARDIAC: S1S2 noted,RRR, no murmurs, rubs, gallops RESPIRATORY:  Clear to auscultation without rales, wheezing  or rhonchi  ABDOMEN: Soft, non-tender, non-distended, +bowel sounds, no guarding. EXTREMITIES: +3 bilateral edema, No cyanosis, no clubbing MUSCULOSKELETAL:  No deformity  SKIN: Warm and dry NEUROLOGIC:  Alert and oriented x 3, non-focal PSYCHIATRIC:  Normal affect, good insight  ASSESSMENT:    1. Medication management   2. Chronic systolic congestive heart failure   3. Ischemic cardiomyopathy   4. Primary hypertension   5. Dyslipidemia   6. Obesity (BMI 30-39.9)   7. Atrial fibrillation, permanent   8. Depressed left ventricular ejection fraction    PLAN:    I reviewed his nuclear stress test result with his prior cardiologist - showed worsening Ef. I would really prefer he get a LHC given his high risk but he has declines and wishes to pursue a second opinion and will get back to me on his decision.  He still does have significant lower extremity edema,I will optimize the his Lasix  Tues, Thurs and Sat twice a day and lasix  daily. Will give  metolazone 2.5 once weekly.   Permanent afib - continue anticoagulation, and sotalol. He may benefit from attempting cardioversion for rhythm control strategy but will hold off for now as there are multiple things going on. First I will like to diurese him and then assess then a LHC.   He is pending establish care visit with EP for his device.  He is not on GDMT- but his blood pressure in low. Once he is close to euvolemic with a stable cr , I also plan to transiton off lisinopril.    The patient understands the need to lose weight with diet and exercise. We have discussed specific strategies for this.  The patient is in agreement with the above plan. The patient left the office in stable condition.  The patient will follow up in 2 weeks or sooner if needed.   Medication Adjustments/Labs and Tests Ordered: Current medicines are reviewed at length with the patient today.  Concerns regarding medicines are outlined above.  Orders Placed This Encounter  Procedures   Basic Metabolic Panel (BMET)   Magnesium   Meds ordered this encounter  Medications   furosemide (LASIX) 40 MG tablet    Sig: Lasix 40 mg once Tuesday, Thursday, Saturday and Sunday. Lasix 40 mg twice Monday, Wednesday and Friday    Dispense:  90 tablet    Refill:  3   metolazone (ZAROXOLYN) 2.5 MG tablet    Sig: Take 1 tablet (2.5 mg total) by mouth once a week. Take 30 minutes before Lasix.    Dispense:  13 tablet    Refill:  3    Patient Instructions  Medication Instructions:  Your physician has recommended you make the following change in your medication:  Lasix 40 mg once Tuesday, Thursday, Saturday and Sunday Lasix 40 mg twice Monday, Wednesday and Friday Metolazone 2.5 mg once weekly, 30 minutes before Lasix.  *If you need a refill on your cardiac medications before your next appointment, please call your pharmacy*   Lab Work: Your physician recommends that you have labs drawn today: BMET, Mag If you have  labs (blood work) drawn today and your tests are completely normal, you will receive your results only by: MyChart Message (if you have MyChart) OR A paper copy in the mail If you have any lab test that is abnormal or we need to change your treatment, we will call you to review the results.   Testing/Procedures: None   Follow-Up: At [facility],  you and your health needs are our priority.  As part of our continuing mission to provide you with exceptional heart care, we have created designated Provider Care Teams.  These Care Teams include your primary Cardiologist (physician) and Advanced Practice Providers (APPs -  Physician Assistants and Nurse Practitioners) who all work together to provide you with the care you need, when you need it.  We recommend signing up for the patient portal called "MyChart".  Sign up information is provided on this After Visit Summary.  MyChart is used to connect with patients for Virtual Visits (Telemedicine).  Patients are able to view lab/test results, encounter notes, upcoming appointments, etc.  Non-urgent messages can be sent to your provider as well.   To learn more about what you can do with MyChart, go to [email or web address].    Your next appointment:   2 week(s)  Provider:   Thomasene Ripple, DO    Adopting a Healthy Lifestyle.  Know what a healthy weight is for you (roughly BMI <25) and aim to maintain this   Aim for 7+ servings of fruits and vegetables daily   65-80+ fluid ounces of water or unsweet tea for healthy kidneys   Limit to max 1 drink of alcohol per day; avoid smoking/tobacco   Limit animal fats in diet for cholesterol and heart health - choose grass fed whenever available   Avoid highly processed foods, and foods high in saturated/trans fats   Aim for low stress - take time to unwind and care for your mental health   Aim for 150 min of moderate intensity exercise weekly for heart health, and weights twice weekly for  bone health   Aim for 7-9 hours of sleep daily   When it comes to diets, agreement about the perfect plan isnt easy to find, even among the experts. Experts at the [facility] [employer] developed an idea known as the Healthy Eating Plate. Just imagine a plate divided into logical, healthy portions.   The emphasis is on diet quality:   Load up on vegetables and fruits - one-half of your plate: Aim for color and variety, and remember that potatoes dont count.   Go for whole grains - one-quarter of your plate: Whole wheat, barley, wheat berries, quinoa, oats, brown rice, and foods made with them. If you want pasta, go with whole wheat pasta.   Protein power - one-quarter of your plate: Fish, chicken, beans, and nuts are all healthy, versatile protein sources. Limit red meat.   The diet, however, does go beyond the plate, offering a few other suggestions.   Use healthy plant oils, such as olive, canola, soy, corn, sunflower and peanut. Check the labels, and avoid partially hydrogenated oil, which have unhealthy trans fats.   If youre thirsty, drink water. Coffee and tea are good in moderation, but skip sugary drinks and limit milk and dairy products to one or two daily servings.   The type of carbohydrate in the diet is more important than the amount. Some sources of carbohydrates, such as vegetables, fruits, whole grains, and beans-are healthier than others.   Finally, stay active  Signed, Thomasene Ripple, DO  04/05/2023 4:34 PM    [facility]

## 2023-04-05 DIAGNOSIS — R0989 Other specified symptoms and signs involving the circulatory and respiratory systems: Secondary | ICD-10-CM | POA: Insufficient documentation

## 2023-04-05 DIAGNOSIS — I4821 Permanent atrial fibrillation: Secondary | ICD-10-CM | POA: Insufficient documentation

## 2023-04-05 DIAGNOSIS — Z79899 Other long term (current) drug therapy: Secondary | ICD-10-CM | POA: Insufficient documentation

## 2023-04-09 ENCOUNTER — Other Ambulatory Visit: Payer: Self-pay

## 2023-04-09 ENCOUNTER — Telehealth: Payer: Self-pay | Admitting: Anesthesiology

## 2023-04-09 MED ORDER — DONEPEZIL HCL 5 MG PO TABS: 5.0000 mg | ORAL_TABLET | Freq: Every day | ORAL | 3 refills | Status: DC

## 2023-04-09 NOTE — Telephone Encounter (Signed)
Pt's wife called requesting a refill  on pt's medication  1. Which medications need refilled? Donepezil 5 mg  2. Which pharmacy/location is medication to be sent to? [facility] in [place]   3. Do they need a 30 day or 90 day supply? 90 day supply

## 2023-04-10 ENCOUNTER — Other Ambulatory Visit: Payer: Self-pay

## 2023-04-10 MED ORDER — DONEPEZIL HCL 5 MG PO TABS: 5.0000 mg | ORAL_TABLET | Freq: Every day | ORAL | 3 refills | Status: DC

## 2023-04-14 ENCOUNTER — Institutional Professional Consult (permissible substitution): Payer: Medicare Other | Admitting: Neurology

## 2023-04-21 ENCOUNTER — Ambulatory Visit: Payer: BC Managed Care – PPO | Attending: Cardiology | Admitting: Cardiology

## 2023-04-22 ENCOUNTER — Encounter: Payer: Self-pay | Admitting: Cardiology

## 2023-04-22 NOTE — Telephone Encounter (Signed)
-----   Message from Baird Lyons, RN sent at 04/22/2023  8:25 AM EDT ----- Regarding: No Show -- needs rescheduling Pt no showed to ICD follow up with Dr. Elberta Fortis on 04/21/23 Please reschedule  Thanks Roanna Raider

## 2023-04-22 NOTE — Telephone Encounter (Signed)
Called patient in attempts to reschedule last appt due to no show/voicemail is full and unable to leave a message/kbl 04/22/23

## 2023-04-28 ENCOUNTER — Ambulatory Visit: Payer: BC Managed Care – PPO | Admitting: Cardiology

## 2023-04-28 ENCOUNTER — Ambulatory Visit: Payer: Medicare Other | Admitting: Nurse Practitioner

## 2023-04-28 ENCOUNTER — Telehealth: Payer: Self-pay | Admitting: Cardiology

## 2023-04-28 NOTE — Telephone Encounter (Signed)
Patient is calling requesting a call back from the nurse. Please advise.

## 2023-04-28 NOTE — Telephone Encounter (Signed)
Unable to leave message as mailbox is full

## 2023-05-07 ENCOUNTER — Telehealth: Payer: Self-pay | Admitting: Cardiology

## 2023-05-07 NOTE — Telephone Encounter (Signed)
Attempted to return call. No answer, voicemail full unable to leave message at this time.

## 2023-05-07 NOTE — Telephone Encounter (Signed)
Daughter wants call back from RN regarding upcoming procedure.

## 2023-05-08 NOTE — Telephone Encounter (Signed)
Daughter states she has an appointment with APP 05/12/23. She states the next appointment with Dr Servando Salina was months away   She wanted to know if patient needs to do anything for appointment .  Bring medication  in case  question arise.  Arrive no later than 3:15 pm    If procedure is need to set up it can be done at visit She voiced understanding.

## 2023-05-11 NOTE — Progress Notes (Unsigned)
Cardiology Clinic Note   Date: 05/12/2023 ID: [name], DOB July 16, 1956, MRN [id number]  Primary Cardiologist:  Thomasene Ripple, DO  Patient Profile    Connor Burns. is a 67 y.o. male who presents to the clinic today for follow-up.  Past medical history significant for: PAF. S/p A-fib ablation CAD. S/p CABG 2004 performed in [place], [place]. Nuclear stress test 03/21/2023 (performed at an outside facility): Large area of infarction in the inferior wall without reversible ischemia. Chronic systolic heart failure/ischemic cardiomyopathy. S/p ICD. Echo 02/26/2023 (performed at an outside facility): EF 30 to 35%.  Mild concentric LVH.  Paradoxical septal motion secondary to CABG.  Cannot determine RV function, ICD present.  Mild MR. Hypertension. Hyperlipidemia. Dementia. CKD.    History of Present Illness    Charlotte Domanski. is a longtime patient of cardiology.  He has a history of CABG performed in [place] in 2004.  He also had an ICD placed by Dr. Chales Abrahams.  He was followed by Dr. Tenny Craw in 2017 and 2018.  He was then seen by cardiologist in [place], [place].  He has moved back to this area and was seen by Dr. Servando Salina on 03/20/2023 to establish care.  At that time he complained of significant shortness of breath without chest pain.  He was fluid overloaded at his visit and Lasix was adjusted.  Echo performed in [place] was reviewed.  Patient was pending nuclear stress test and previous cardiologist plan to follow-up with further ischemic evaluation if warranted.  Patient was established with Dr. Elberta Fortis in [place] to follow ICD.  Patient was last seen in the office by Dr. Servando Salina on 04/03/2023 with reports of improved shortness of breath and lower extremity edema.  Stress test from [place] was reviewed and recommendation for LHC which he declined at that time preferring to get a second opinion.  Lower extremity edema was still significant and Lasix was optimized further with the  addition of metolazone.  Today, patient is accompanied by his wife and daughter. He reports lower extremity edema is resolved and his weight is down. He continues to have complaints of dyspnea with heavier exertion. He does not become dyspneic if he paces himself. He denies chest pain, pressure, or tightness. No orthopnea or PND. Discussed LHC with patient as per Dr. Mallory Shirk last note. He is concerned about undergoing procedure secondary to his kidney function. Discussed case with Dr. Tresa Endo, DOD, who feels LHC should be deferred given his nuclear stress test did not show reversible ischemia and his last creatinine was 2.44.  He suggest starting patient on low-dose carvedilol.  Upon discussing these recommendations with patient and his family patient's wife reports patient has not been taking sotalol since March.  It appears there was some confusion with the medication PCP wanted him to stop and instead he stop sotalol.    ROS: All other systems reviewed and are otherwise negative except as noted in History of Present Illness.  Studies Reviewed    ECG personally reviewed by me today: Afib with PVCs, rate 91 bpm.  No significant changes from 03/20/2023.  Risk Assessment/Calculations     CHA2DS2-VASc Score = 4   This indicates a 4.8% annual risk of stroke. The patient's score is based upon: CHF History: 1 HTN History: 1 Diabetes History: 0 Stroke History: 0 Vascular Disease History: 1 Age Score: 1 Gender Score: 0             Physical Exam    VS:  BP  126/70 (BP Location: Left Arm, Patient Position: Sitting, Cuff Size: Large)   Pulse 90   Ht 6\' 2"  (1.88 m)   Wt 232 lb (105.2 kg)   SpO2 96%   BMI 29.79 kg/m  , BMI Body mass index is 29.79 kg/m.  GEN: Well nourished, well developed, in no acute distress. Neck: No JVD or carotid bruits. Cardiac: Irregular rhythm. No murmurs. No rubs or gallops.   Respiratory:  Respirations regular and unlabored. Clear to auscultation without rales,  wheezing or rhonchi. GI: Soft, nontender, nondistended. Extremities: Radials/DP/PT 2+ and equal bilaterally. No clubbing or cyanosis. No edema.  Skin: Warm and dry, no rash. Neuro: Strength intact.  Assessment & Plan    CAD.  S/p CABG 2004 performed in [place], [place].  Nuclear stress test March 2024 performed in [place] showed large area of infarction in the inferior wall without reversible ischemia.  LHC was recommended by Dr. Servando Salina on 04/03/2023 but patient declined at that time.  He denies chest pain, pressure, tightness.  Discussed LHC with patient and family.  He has concerns secondary to his kidney function.  Discussed case with Dr. Tresa Endo, DOD, who recommends deferring LHC and starting patient on carvedilol.  Will start carvedilol 3.125 mg twice daily.  Continue aspirin and rosuvastatin. PAF.  S/p A-fib ablation (remote history).  Patient denies palpitations or feeling like his heart is racing.  EKG today shows A-fib with a rate of 91 bpm.  Denies spontaneous bleeding concerns.  Upon further discussion with patient and family it is discovered that patient has not been taking sotalol for 2 months secondary to confusion with stopping medication per PCP and instead stop sotalol.  Will start carvedilol 3.125 mg twice daily and refer patient to A-fib clinic for potential restart of sotalol.  Continue Xarelto. Chronic systolic heart failure/lower extremity edema/DOE.  Echo March 2024 performed in [place] showed EF 30 to 35%.  Patient reports lower extremity edema has resolved and his weight is down.  Per weight records in our system patient is down 25 pounds from 04/03/2023.  He reports continued DOE with heavier exertion.  He is able to manage his dyspnea by pacing himself and doing activity slowly.  Euvolemic and well compensated on exam.  Start carvedilol 3.125 mg twice daily.  Continue lisinopril, Lasix, metolazone. Hypertension. BP today 126/70.  Patient denies headaches, dizziness or vision  changes.  Start carvedilol 3.125 mg twice daily.  Continue lisinopril.  Disposition: Carvedilol 3.125 mg twice daily.  Refer to A-fib clinic for potential restart of the sotalol.  Return in 3 months with Dr. Servando Salina or sooner as needed.      Signed, Etta Grandchild. Seville Brick, DNP, NP-C

## 2023-05-12 ENCOUNTER — Encounter: Payer: Self-pay | Admitting: Student

## 2023-05-12 ENCOUNTER — Ambulatory Visit: Payer: BC Managed Care – PPO | Attending: Cardiology | Admitting: Student

## 2023-05-12 VITALS — BP 126/70 | HR 90 | Ht 74.0 in | Wt 232.0 lb

## 2023-05-12 DIAGNOSIS — I5022 Chronic systolic (congestive) heart failure: Secondary | ICD-10-CM | POA: Diagnosis not present

## 2023-05-12 DIAGNOSIS — R0609 Other forms of dyspnea: Secondary | ICD-10-CM

## 2023-05-12 DIAGNOSIS — I480 Paroxysmal atrial fibrillation: Secondary | ICD-10-CM

## 2023-05-12 DIAGNOSIS — R600 Localized edema: Secondary | ICD-10-CM

## 2023-05-12 DIAGNOSIS — I10 Essential (primary) hypertension: Secondary | ICD-10-CM

## 2023-05-12 MED ORDER — CARVEDILOL 3.125 MG PO TABS: 3.1250 mg | ORAL_TABLET | Freq: Two times a day (BID) | ORAL | 3 refills | Status: DC

## 2023-05-12 NOTE — Patient Instructions (Signed)
Medication Instructions:  Your physician has recommended you make the following change in your medication:  START: Carvedilol 3.125mg  BID  *If you need a refill on your cardiac medications before your next appointment, please call your pharmacy*   Lab Work: NONE If you have labs (blood work) drawn today and your tests are completely normal, you will receive your results only by: MyChart Message (if you have MyChart) OR A paper copy in the mail If you have any lab test that is abnormal or we need to change your treatment, we will call you to review the results.   Testing/Procedures: NONE   Follow-Up: At [facility], you and your health needs are our priority.  As part of our continuing mission to provide you with exceptional heart care, we have created designated Provider Care Teams.  These Care Teams include your primary Cardiologist (physician) and Advanced Practice Providers (APPs -  Physician Assistants and Nurse Practitioners) who all work together to provide you with the care you need, when you need it.  We recommend signing up for the patient portal called "MyChart".  Sign up information is provided on this After Visit Summary.  MyChart is used to connect with patients for Virtual Visits (Telemedicine).  Patients are able to view lab/test results, encounter notes, upcoming appointments, etc.  Non-urgent messages can be sent to your provider as well.   To learn more about what you can do with MyChart, go to [email or web address].    Your next appointment:   3 month(s)  Provider:   Thomasene Ripple, DO

## 2023-05-13 ENCOUNTER — Institutional Professional Consult (permissible substitution): Payer: Medicare Other | Admitting: Neurology

## 2023-05-14 ENCOUNTER — Telehealth: Payer: Self-pay | Admitting: Neurology

## 2023-05-14 ENCOUNTER — Encounter: Payer: Self-pay | Admitting: Neurology

## 2023-05-14 NOTE — Telephone Encounter (Signed)
Patient had second NS for new pt eval in sleep clinic on 05/13/23. Please review and follow dismissal protocol as per our No Show Policy.

## 2023-06-05 ENCOUNTER — Ambulatory Visit (HOSPITAL_COMMUNITY): Payer: BC Managed Care – PPO | Admitting: Internal Medicine

## 2023-06-09 ENCOUNTER — Ambulatory Visit: Payer: Self-pay | Admitting: Cardiology

## 2023-06-11 ENCOUNTER — Encounter: Payer: Medicare Other | Admitting: Nurse Practitioner

## 2023-06-11 ENCOUNTER — Other Ambulatory Visit: Payer: Self-pay

## 2023-06-11 NOTE — Progress Notes (Deleted)
[name], CMA,acting as a Neurosurgeon for [name], FNP.,have documented all relevant documentation on the behalf of [name], FNP,as directed by  [name], FNP while in the presence of [name], FNP.  Subjective:  Patient ID: Connor Burns. , male    DOB: 01/01/1956 , 67 y.o.   MRN: [id number]  No chief complaint on file.   HPI  Patient presents today for a BP and chol check, patient reports compliance with medications and has no other concerns today. Patient denies any chest pain, SOB, and headaches.     Past Medical History:  Diagnosis Date  . Acute osteomyelitis of ankle and foot 05/17/2014  . Arthritis 10/27/2020   Wrist  . Atherosclerosis of native arteries of the extremities with ulceration 06/15/2014  . 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  He is to restart his Xarelto on Sunday night.  Marland Kitchen CAD (coronary artery disease)   . CHF (congestive heart failure) 01/14/2017   Intermittent CHF with lower extremity edema.  I have given him Lasix to take on a p.r.n. basis.Formatting of this note might be different from the original. Last Assessment & Plan: Formatting of this note might be different from the original. Intermittent CHF with lower extremity edema.  I have given him Las  . Chronic kidney disease 10/27/2020   Stage II  . Congestive cardiomyopathy 01/14/2017   Cardiomyopathy with LV dysfunction.Last ejection fraction 40-45% by echocardiography in 2012.Patient currently not taking beta-blocker Ace inhibitor for some reason.We will repeat echocardiography to evaluate LV function then determine the need for afterload reduction and beta-blocker therapy.[place]  . Coronary atherosclerosis 01/14/2017   Status remote CABG in 2004 in [place].Left internal mammary artery to the obtuse marginal.Free Right internal  mammary artery to the LAD.Saphenous vein graft to OM 2.Saphenous vein graft to the RCA.  Asymptomatic without chest pain or shortness of breath.Last nuclear stress test May 2018:  Large  . DVT (deep venous thrombosis)   . Dyslipidemia 05/07/2018  . Hematuria 08/10/2015  . Hypertension   . ICD (implantable cardioverter-defibrillator) battery depletion 06/16/2018   Status post dual-chamber ICD generator change 06/16/2018.Last Assessment & Plan: Formatting of this note might be different from the original.Left infraclavicular incision intact with Dermabond.  There is no pocket hematoma present.  No drainage noted.Device interrogation this a.m. reveals appropriate device function.  . Ischemic cardiomyopathy 06/18/2016   Echo 09/14/2011 showing LA 4.9cm, EF 40-45%.  Mild to moderate decreased LV systolic function.  Mild concentric LVH.  LA is moderately dilated.  Mild aortic cusp sclerosis. Aortic valve appears to be bicuspid.B. Concerning for potential dysrhythmia (ventricular tachycardia) especially given documented nonsustained ventricular tachy  . Mild dementia, unclear etiology 12/30/2022  . Myocardial infarction   . Obesity (BMI 30-39.9) 08/10/2015  . Onychomycosis 05/10/2014  . Painful legs and moving toes of left foot 05/10/2014  . Paroxysmal atrial fibrillation 08/08/2015   A. Paroxysmal.B. On Sotalol therapy.Last Assessment & Plan: History of present atrial fibrillation.  Patient will 80 mg twice daily.Also on rivaroxaban 20 mg a day.Patient again refused an ECG.I explained to the patient, that the absence of an EKG, I canno  . Peripheral vascular disease   . Shortness of breath 10/27/2020   Exertional  . Stroke 10/27/2020   Right frontal infarct per neuroimaging dating back at least to 2013  . Syncope 01/14/2017  In the setting of ischemic cardiomyopathy. B. Concerning for potential dysrhythmia (ventricular tachycardia) especially given documented nonsustained ventricular tachycardia -  EPS 9/25/201 showing reproducibly inducible nonsustained monomorphic VT up to 10 beat.  Reproducibly inducible nonsustained polymorphic VT.  Inducible VF wi  . Typical atrial flutter 08/08/2015  . Ulcer of foot, chronic 10/06/2014  . Ventricular tachycardia 01/14/2017   A. In the setting of ischemic cardiomyopathy. B. Concerning for potential dysrhythmia (ventricular tachycardia) especially given documented nonsustained ventricular tachycardia - EPS 9/25/201 showing reproducibly inducible nonsustained monomorphic VT up to 10 beat.  Reproducibly inducible nonsustained polymorphic VT.  Inducible VF wi     Family History  Problem Relation Age of Onset  . Heart disease Mother        before age 65  . Hyperlipidemia Mother   . Hypertension Mother   . Heart attack Mother   . Hyperlipidemia Father   . Hypertension Father   . Memory loss Brother   . Memory loss Brother   . Memory loss Half-Sister   . Memory loss Maternal Aunt      Current Outpatient Medications:  .  aspirin EC 81 MG tablet, Take 81 mg by mouth at bedtime. Swallow whole., Disp: , Rfl:  .  atorvastatin (LIPITOR) 20 MG tablet, Take by mouth. (Patient not taking: Reported on 02/20/2023), Disp: , Rfl:  .  carvedilol (COREG) 3.125 MG tablet, Take 1 tablet (3.125 mg total) by mouth 2 (two) times daily., Disp: 180 tablet, Rfl: 3 .  colchicine 0.6 MG tablet, Take 0.6 mg by mouth 2 (two) times daily. (Patient not taking: Reported on 02/20/2023), Disp: , Rfl:  .  cyanocobalamin (VITAMIN B12) 1000 MCG tablet, Take 1 tablet (1,000 mcg total) by mouth daily., Disp: 30 tablet, Rfl: 2 .  cyclobenzaprine (FLEXERIL) 5 MG tablet, Take 1 tablet (5 mg total) by mouth at bedtime. (Patient not taking: Reported on 03/20/2023), Disp: 90 tablet, Rfl: 3 .  diclofenac (VOLTAREN) 75 MG EC tablet, Take 75 mg by mouth 2 (two) times daily. (Patient not taking: Reported on 03/20/2023), Disp: , Rfl:  .  donepezil (ARICEPT) 5 MG tablet, Take 1 tablet (5 mg total) by  mouth daily., Disp: 30 tablet, Rfl: 3 .  furosemide (LASIX) 40 MG tablet, Lasix 40 mg once Tuesday, Thursday, Saturday and Sunday. Lasix 40 mg twice Monday, Wednesday and Friday, Disp: 90 tablet, Rfl: 3 .  lisinopril (PRINIVIL,ZESTRIL) 10 MG tablet, Take 10 mg by mouth daily. (Patient not taking: Reported on 03/20/2023), Disp: , Rfl:  .  metolazone (ZAROXOLYN) 2.5 MG tablet, Take 1 tablet (2.5 mg total) by mouth once a week. Take 30 minutes before Lasix., Disp: 13 tablet, Rfl: 3 .  oxybutynin (DITROPAN-XL) 10 MG 24 hr tablet, Take by mouth. (Patient not taking: Reported on 04/03/2023), Disp: , Rfl:  .  rosuvastatin (CRESTOR) 10 MG tablet, Take 1 tablet (10 mg total) by mouth every other day. (Patient not taking: Reported on 03/20/2023), Disp: 30 tablet, Rfl: 11 .  sildenafil (REVATIO) 20 MG tablet, SMARTSIG:2-5 Tablet(s) By Mouth PRN, Disp: , Rfl:  .  sotalol (BETAPACE) 80 MG tablet, Take 80 mg by mouth every evening. (Patient not taking: Reported on 03/20/2023), Disp: , Rfl:  .  XARELTO 20 MG TABS tablet, TAKE 1 TABLET BY MOUTH ONCE DAILY WITH SUPPER (Patient taking differently: Take 20 mg by mouth at bedtime.), Disp: 30 tablet, Rfl: 6   Allergies  Allergen Reactions  . Clopidogrel Anaphylaxis and Other (See Comments)    "Wierd,  like out of body thing" Out of body experience   . Plavix [Clopidogrel Bisulfate]      Review of Systems   There were no vitals filed for this visit. There is no height or weight on file to calculate BMI.  Wt Readings from Last 3 Encounters:  05/12/23 232 lb (105.2 kg)  04/03/23 257 lb 3.2 oz (116.7 kg)  03/20/23 270 lb 3.2 oz (122.6 kg)    The ASCVD Risk score (Arnett DK, et al., 2019) failed to calculate for the following reasons:   The patient has a prior MI or stroke diagnosis  Objective:  Physical Exam      Assessment And Plan:  1. Abnormal glucose  2. Hypertension, unspecified type    No follow-ups on file.  Patient was given opportunity to ask  questions. Patient verbalized understanding of the plan and was able to repeat key elements of the plan. All questions were answered to their satisfaction.  [name], FNP  I, [name], FNP, have reviewed all documentation for this visit. The documentation on 06/11/23 for the exam, diagnosis, procedures, and orders are all accurate and complete.   IF YOU HAVE BEEN REFERRED TO A SPECIALIST, IT MAY TAKE 1-2 WEEKS TO SCHEDULE/PROCESS THE REFERRAL. IF YOU HAVE NOT HEARD FROM US/SPECIALIST IN TWO WEEKS, PLEASE GIVE [place] A CALL AT [phone].

## 2023-06-12 ENCOUNTER — Ambulatory Visit: Payer: Self-pay | Admitting: Nurse Practitioner

## 2023-06-24 ENCOUNTER — Telehealth: Payer: Self-pay | Admitting: Cardiology

## 2023-06-24 NOTE — Telephone Encounter (Signed)
-----   Message from Baird Lyons, RN sent at 06/09/2023 10:58 AM EDT ----- Regarding: r/s with [facility] Pt No Showed today for appt to establish device care w/ [facility]. Please reach out and get pt rescheduled  Thx ;)

## 2023-06-24 NOTE — Telephone Encounter (Signed)
LVM on home phone for patient to call and r/s due to last missed appt/kbl 06/24/23

## 2023-06-25 ENCOUNTER — Encounter: Payer: Self-pay | Admitting: Cardiology

## 2023-06-27 NOTE — Telephone Encounter (Signed)
-----   Message from Sherri L Price, RN sent at 06/09/2023 10:58 AM EDT ----- Regarding: r/s with [facility] Pt No Showed today for appt to establish device care w/ [facility]. Please reach out and get pt rescheduled  Thx ;)  

## 2023-06-27 NOTE — Telephone Encounter (Signed)
Called patient to schedule appt/vm full/kbl 06/27/23

## 2023-07-01 NOTE — Progress Notes (Signed)
Not seen

## 2023-07-08 NOTE — Telephone Encounter (Signed)
Called patient to schedule appt/vm full/kbl 07/08/23

## 2023-07-28 ENCOUNTER — Encounter: Payer: Self-pay | Admitting: Physician Assistant

## 2023-07-28 ENCOUNTER — Ambulatory Visit (INDEPENDENT_AMBULATORY_CARE_PROVIDER_SITE_OTHER): Payer: Medicare Other | Admitting: Physician Assistant

## 2023-07-28 VITALS — BP 125/84 | HR 86 | Resp 18 | Ht 74.0 in | Wt 286.0 lb

## 2023-07-28 DIAGNOSIS — R413 Other amnesia: Secondary | ICD-10-CM | POA: Diagnosis not present

## 2023-07-28 DIAGNOSIS — F03A0 Unspecified dementia, mild, without behavioral disturbance, psychotic disturbance, mood disturbance, and anxiety: Secondary | ICD-10-CM

## 2023-07-28 MED ORDER — DONEPEZIL HCL 10 MG PO TABS: 10.0000 mg | ORAL_TABLET | Freq: Every day | ORAL | 11 refills | Status: DC

## 2023-07-28 NOTE — Patient Instructions (Signed)
Ct at [facility] [phone]

## 2023-07-28 NOTE — Progress Notes (Signed)
Assessment/Plan:   Dementia of unclear etiology, without behavioral disturbance   Connor Burns. is a very pleasant 67 y.o. RH male with a history of hypertension, hyperlipidemia, PVD, Afib on Xarelto, s/p ICD, congestive cardiomyopathy, history of stroke 2006, arthritis, CKD3, gout, peripheral neuropathy,  prediabetes and a diagnosis of mild dementia of unclear etiology per neuropsych evaluation on Jan 2024, seen today in follow up for memory loss. Patient is currently on donepezil 5 mg daily . MMSE today is 25/30 . 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 He is able to perform most of his ADLS, no longer drives    Follow up in 6  months. Increase donepezil to 10 mg daily, side effects discussed Continue B12 supplements CT is yet to be performed, recommend proceeding with it to further evaluate structural abnormalities vascular load. Repeat neuropsych testing  for clarity of the diagnosis and disease trajectory Recommend good control of her cardiovascular risk factors Continue to control mood as per PCP     Subjective:    This patient is accompanied in the office by his wife who supplements the history.  Previous records as well as any outside records available were reviewed prior to todays visit. Patient was last seen on 02/20/2023   Any changes in memory since last visit? " May e worse"-wife reports. "He forgets conversations frequently". He gets sometimes frustrated with crossword puzzles. Not very active outside of the house.  repeats oneself?  Endorsed by his wife Disoriented when walking into a room?  Patient denies  Leaving objects in unusual places?  May misplace things but not in unusual places  Wandering behavior?  denies   Any personality changes since last visit?  denies   Any worsening depression?:  Denies.   Hallucinations or paranoia?  Denies.   Seizures? denies    Any sleep changes?  Denies vivid  dreams, REM behavior or sleepwalking   Sleep apnea?   Denies.   Any hygiene concerns? Denies.  Independent of bathing and dressing?  Endorsed  Does the patient needs help with medications?  Wife is in charge   Who is in charge of the finances?  Wife  is in charge     Any changes in appetite? " He forgets that he ate and eats again" Patient have trouble swallowing? Denies.   Does the patient cook? No Any headaches?   denies   Chronic back pain  denies   Ambulates with difficulty? Denies.    Recent falls or head injuries? denies     Unilateral weakness, numbness or tingling? denies   Any tremors?  Denies   Any anosmia?  Denies   Any incontinence of urine?  Endorsed, wears diapers Any bowel dysfunction?   Denies      Patient lives with his wife    Does the patient drive?  Endorsed, but he forgets some routes     Initial visit 02/26/22 The patient is seen in neurologic consultation at the request of [name], [name], MD for the evaluation of memory.  The patient is accompanied by his daughter who supplements the history. This is a 67 y.o. year old RH  male who has had memory issues for about 2 years, initially noticed by his family.  He is good at "it was not bad, but since January it got worse ".  Since that time, he is repeating himself more, but attributes this as multitasking, "I may become  more distracted than I forget ".  He denies being disoriented when walking into her room or leaving objects in unusual places.  He ambulates without difficulty, denies any falls or head injuries.  He works out at least 2 times a week.  He continues to drive, and denies getting lost.  He lives with his wife and his youngest daughter.  His mood is good, he has moments in which she is more irritable "my father is a very passionate person, and he gets angry if he gets bothered ".  "He is not as bad as he used to be ".  He denies any history of depression or anxiety, hallucinations or paranoia.  He sleeps fairly  well, denies vivid dreams or sleepwalking or REM behavior.  There are no hygiene concerns, he is independent of bathing and dressing.  Sometimes he forgets his medications.  There are no issues with his finances.  "I am on top of it ".  His appetite is not great according to him, he is trying to control his sugar intake to maintain a better weight.  He denies any trouble swallowing.  He does not cook.  He denies any headaches,double vision, dizziness, no new focal numbness or tingling, unilateral weakness, tremors or anosmia. No history of seizures. Denies urine incontinence, retention, constipation or diarrhea.  Denies OSA, ETOH or Tobacco. Family History remarkable for maternal grandmother and sister "with some sort of dementia "     Neuropsych evaluation 12/30/22  Briefly, results suggested ongoing impairment surrounding executive functioning, confrontation naming, visuospatial abilities (outside of clock drawing), and all aspects of verbal learning and memory. While receptive language also represented a normative impairment, he missed very few items across this task, suggesting that this might represent more of a normative impairment than a clinical impairment. An additional weakness was exhibited across attention/concentration while performance variability was exhibited across processing speed. The etiology of his presentation is unclear as current testing patterns are fairly nonspecific in nature. His most recent neuroimaging which I was able to review is in the form of a head CT dated to 2013. 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 Given that no more recent scans could be located, I am unable to comment on any additional events over the past 10-11 years or the severity of underlying cerebrovascular disease which could be playing an active role in his current presentation. 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 Across  these tasks, he did not benefit from repeated exposure to new information, was largely amnestic after brief delays, and performed poorly across yes/no recognition trials. This suggests concerns for rapid forgetting and a significant memory storage impairment. While this could raise concerns for the presence of a neurodegenerative illness such as Alzheimer's disease (especially given additional impairment surrounding confrontation naming), visual memory remained very strong. While this is both unexpected and encouraging, continued monitoring will be important to see if cognitive impairments remain stable or if progressive decline over the next few years occurs.     Recent B12  893, TSH 2.6, A1C 5.8   PREVIOUS MEDICATIONS:   CURRENT MEDICATIONS:  Outpatient Encounter Medications as of 07/28/2023  Medication Sig   aspirin EC 81 MG tablet Take 81 mg by mouth at bedtime. Swallow whole.   carvedilol (COREG) 3.125 MG tablet Take 1 tablet (3.125 mg total) by mouth 2 (two) times daily.   cyanocobalamin (VITAMIN B12) 1000 MCG tablet Take 1 tablet (1,000 mcg total)  by mouth daily.   furosemide (LASIX) 40 MG tablet Lasix 40 mg once Tuesday, Thursday, Saturday and Sunday. Lasix 40 mg twice Monday, Wednesday and Friday   metolazone (ZAROXOLYN) 2.5 MG tablet Take 1 tablet (2.5 mg total) by mouth once a week. Take 30 minutes before Lasix.   sildenafil (REVATIO) 20 MG tablet SMARTSIG:2-5 Tablet(s) By Mouth PRN   XARELTO 20 MG TABS tablet TAKE 1 TABLET BY MOUTH ONCE DAILY WITH SUPPER (Patient taking differently: Take 20 mg by mouth at bedtime.)   [DISCONTINUED] donepezil (ARICEPT) 5 MG tablet Take 1 tablet (5 mg total) by mouth daily.   atorvastatin (LIPITOR) 20 MG tablet Take by mouth. (Patient not taking: Reported on 02/20/2023)   colchicine 0.6 MG tablet Take 0.6 mg by mouth 2 (two) times daily. (Patient not taking: Reported on 02/20/2023)   cyclobenzaprine (FLEXERIL) 5 MG tablet Take 1 tablet (5 mg total) by mouth  at bedtime. (Patient not taking: Reported on 03/20/2023)   diclofenac (VOLTAREN) 75 MG EC tablet Take 75 mg by mouth 2 (two) times daily. (Patient not taking: Reported on 03/20/2023)   donepezil (ARICEPT) 10 MG tablet Take 1 tablet (10 mg total) by mouth daily.   lisinopril (PRINIVIL,ZESTRIL) 10 MG tablet Take 10 mg by mouth daily. (Patient not taking: Reported on 03/20/2023)   oxybutynin (DITROPAN-XL) 10 MG 24 hr tablet Take by mouth. (Patient not taking: Reported on 04/03/2023)   rosuvastatin (CRESTOR) 10 MG tablet Take 1 tablet (10 mg total) by mouth every other day. (Patient not taking: Reported on 03/20/2023)   sotalol (BETAPACE) 80 MG tablet Take 80 mg by mouth every evening. (Patient not taking: Reported on 03/20/2023)   No facility-administered encounter medications on file as of 07/28/2023.       07/28/2023    5:00 PM  MMSE - Mini Mental State Exam  Orientation to time 3  Orientation to Place 5  Registration 3  Attention/ Calculation 4  Recall 1  Language- name 2 objects 2  Language- repeat 1  Language- follow 3 step command 3  Language- read & follow direction 1  Write a sentence 1  Copy design 1  Total score 25      03/18/2022    3:00 PM  Montreal Cognitive Assessment   Visuospatial/ Executive (0/5) 3  Naming (0/3) 3  Attention: Read list of digits (0/2) 2  Attention: Read list of letters (0/1) 1  Attention: Serial 7 subtraction starting at 100 (0/3) 3  Language: Repeat phrase (0/2) 1  Language : Fluency (0/1) 1  Abstraction (0/2) 1  Delayed Recall (0/5) 3  Orientation (0/6) 6  Total 24  Adjusted Score (based on education) 24    Objective:     PHYSICAL EXAMINATION:    VITALS:   Vitals:   07/28/23 1512  BP: 125/84  Pulse: 86  Resp: 18  SpO2: 98%  Weight: 286 lb (129.7 kg)  Height: 6\' 2"  (1.88 m)    GEN:  The patient appears stated age and is in NAD. HEENT:  Normocephalic, atraumatic.   Neurological examination:  General: NAD, well-groomed, appears  stated age. Orientation: The patient is alert. Oriented to person, place and not to date Cranial nerves: There is good facial symmetry.The speech is fluent and clear. No aphasia or dysarthria. Fund of knowledge is appropriate. Recent and remote memory are impaired. Attention and concentration are reduced.  Able to name objects and repeat phrases.  Hearing is intact to conversational tone.   Sensation: Sensation is intact  to light touch throughout Motor: Strength is at least antigravity x4. DTR's 2/4 in UE/LE     Movement examination: Tone: There is normal tone in the UE/LE Abnormal movements:  no tremor.  No myoclonus.  No asterixis.   Coordination:  There is no decremation with RAM's. Normal finger to nose  Gait and Station: The patient has no difficulty arising out of a deep-seated chair without the use of the hands. The patient's stride length is good.  Gait is cautious and narrow.    Thank you for allowing [place] the opportunity to participate in the care of this nice patient. Please do not hesitate to contact us for any questions or concerns.   Total time spent on today's visit was 31 minutes dedicated to this patient today, preparing to see patient, examining the patient, ordering tests and/or medications and counseling the patient, documenting clinical information in the EHR or other health record, independently interpreting results and communicating results to the patient/family, discussing treatment and goals, answering patient's questions and coordinating care.  Cc:  [name], FNP  Marlowe Kays 07/28/2023 5:46 PM

## 2023-08-06 ENCOUNTER — Telehealth: Payer: Self-pay | Admitting: Cardiology

## 2023-08-06 ENCOUNTER — Other Ambulatory Visit (HOSPITAL_COMMUNITY): Payer: Self-pay

## 2023-08-06 NOTE — Telephone Encounter (Signed)
There aren't any active plans pulling for patient in [place].   I did try and add the insurance info from the last state card that was uploaded to chart. Rejection states "coverage terminated".   Patient will need to provide [place] with insurance information in order to process any PA's.

## 2023-08-06 NOTE — Telephone Encounter (Signed)
Left voicemail to return call to office.

## 2023-08-06 NOTE — Telephone Encounter (Signed)
Ok to give some samples.Xarelto 20mg .  When will his new plan be active?

## 2023-08-06 NOTE — Telephone Encounter (Signed)
Wife stated she was not sure but they are working on getting it switched over.

## 2023-08-06 NOTE — Telephone Encounter (Signed)
Patient calling the office for samples of medication:   1.  What medication and dosage are you requesting samples for? XARELTO 20 MG TABS tablet   2.  Are you currently out of this medication? Yes    

## 2023-08-06 NOTE — Telephone Encounter (Signed)
Patient wife is calling to see if patient can have samples of xarelto. She states he is retiring and in the process of changing his insurance and when she went to pick up medication they informed her it will be $600 they can not afford that right now.

## 2023-08-07 MED ORDER — RIVAROXABAN 20 MG PO TABS: 20.0000 mg | ORAL_TABLET | Freq: Every day | ORAL | Status: DC

## 2023-08-07 NOTE — Telephone Encounter (Signed)
Wife aware patient can have 2 week supply of samples. They will be placed at front desk ready for pick up.

## 2023-08-14 ENCOUNTER — Ambulatory Visit: Payer: Self-pay | Admitting: Nurse Practitioner

## 2023-08-14 ENCOUNTER — Ambulatory Visit: Payer: BC Managed Care – PPO | Admitting: Cardiology

## 2023-08-14 NOTE — Progress Notes (Deleted)
[name], CMA,acting as a Neurosurgeon for Connor Felts, FNP.,have documented all relevant documentation on the behalf of Connor Felts, FNP,as directed by  Connor Felts, FNP while in the presence of Connor Felts, FNP.  Subjective:  Patient ID: Connor Burns. , male    DOB: 1956-02-08 , 67 y.o.   MRN: [id number]  No chief complaint on file.   HPI  Patient presented today for a BP follow up, patient reports compliance with medications.  Patient denies any chest pain, SOB, or headaches. Patient has no concerns today.    Past Medical History:  Diagnosis Date  . Acute osteomyelitis of ankle and foot 05/17/2014  . Arthritis 10/27/2020   Wrist  . Atherosclerosis of native arteries of the extremities with ulceration 06/15/2014  . 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  He is to restart his Xarelto on Sunday night.  Marland Kitchen CAD (coronary artery disease)   . CHF (congestive heart failure) 01/14/2017   Intermittent CHF with lower extremity edema.  I have given him Lasix to take on a p.r.n. basis.Formatting of this note might be different from the original. Last Assessment & Plan: Formatting of this note might be different from the original. Intermittent CHF with lower extremity edema.  I have given him Las  . Chronic kidney disease 10/27/2020   Stage II  . Congestive cardiomyopathy 01/14/2017   Cardiomyopathy with LV dysfunction.Last ejection fraction 40-45% by echocardiography in 2012.Patient currently not taking beta-blocker Ace inhibitor for some reason.We will repeat echocardiography to evaluate LV function then determine the need for afterload reduction and beta-blocker therapy.[place]in  . Coronary atherosclerosis 01/14/2017   Status remote CABG in 2004 in [place].Left internal mammary artery to the obtuse marginal.Free Right internal mammary  artery to the LAD.Saphenous vein graft to OM 2.Saphenous vein graft to the RCA.  Asymptomatic without chest pain or shortness of breath.Last nuclear stress test May 2018:  Large  . DVT (deep venous thrombosis)   . Dyslipidemia 05/07/2018  . Hematuria 08/10/2015  . Hypertension   . ICD (implantable cardioverter-defibrillator) battery depletion 06/16/2018   Status post dual-chamber ICD generator change 06/16/2018.Last Assessment & Plan: Formatting of this note might be different from the original.Left infraclavicular incision intact with Dermabond.  There is no pocket hematoma present.  No drainage noted.Device interrogation this a.m. reveals appropriate device function.  . Ischemic cardiomyopathy 06/18/2016   Echo 09/14/2011 showing LA 4.9cm, EF 40-45%.  Mild to moderate decreased LV systolic function.  Mild concentric LVH.  LA is moderately dilated.  Mild aortic cusp sclerosis. Aortic valve appears to be bicuspid.B. Concerning for potential dysrhythmia (ventricular tachycardia) especially given documented nonsustained ventricular tachy  . Mild dementia, unclear etiology 12/30/2022  . Myocardial infarction   . Obesity (BMI 30-39.9) 08/10/2015  . Onychomycosis 05/10/2014  . Painful legs and moving toes of left foot 05/10/2014  . Paroxysmal atrial fibrillation 08/08/2015   A. Paroxysmal.B. On Sotalol therapy.Last Assessment & Plan: History of present atrial fibrillation.  Patient will 80 mg twice daily.Also on rivaroxaban 20 mg a day.Patient again refused an ECG.I explained to the patient, that the absence of an EKG, I canno  . Peripheral vascular disease   . Shortness of breath 10/27/2020   Exertional  . Stroke 10/27/2020   Right frontal infarct per neuroimaging dating back at least to 2013  . Syncope 01/14/2017   In  the setting of ischemic cardiomyopathy. B. Concerning for potential dysrhythmia (ventricular tachycardia) especially given documented nonsustained ventricular tachycardia - EPS  9/25/201 showing reproducibly inducible nonsustained monomorphic VT up to 10 beat.  Reproducibly inducible nonsustained polymorphic VT.  Inducible VF wi  . Typical atrial flutter 08/08/2015  . Ulcer of foot, chronic 10/06/2014  . Ventricular tachycardia 01/14/2017   A. In the setting of ischemic cardiomyopathy. B. Concerning for potential dysrhythmia (ventricular tachycardia) especially given documented nonsustained ventricular tachycardia - EPS 9/25/201 showing reproducibly inducible nonsustained monomorphic VT up to 10 beat.  Reproducibly inducible nonsustained polymorphic VT.  Inducible VF wi     Family History  Problem Relation Age of Onset  . Heart disease Mother        before age 93  . Hyperlipidemia Mother   . Hypertension Mother   . Heart attack Mother   . Hyperlipidemia Father   . Hypertension Father   . Memory loss Brother   . Memory loss Brother   . Memory loss Half-Sister   . Memory loss Maternal Aunt      Current Outpatient Medications:  .  aspirin EC 81 MG tablet, Take 81 mg by mouth at bedtime. Swallow whole., Disp: , Rfl:  .  atorvastatin (LIPITOR) 20 MG tablet, Take by mouth. (Patient not taking: Reported on 02/20/2023), Disp: , Rfl:  .  carvedilol (COREG) 3.125 MG tablet, Take 1 tablet (3.125 mg total) by mouth 2 (two) times daily., Disp: 180 tablet, Rfl: 3 .  colchicine 0.6 MG tablet, Take 0.6 mg by mouth 2 (two) times daily. (Patient not taking: Reported on 02/20/2023), Disp: , Rfl:  .  cyanocobalamin (VITAMIN B12) 1000 MCG tablet, Take 1 tablet (1,000 mcg total) by mouth daily., Disp: 30 tablet, Rfl: 2 .  cyclobenzaprine (FLEXERIL) 5 MG tablet, Take 1 tablet (5 mg total) by mouth at bedtime. (Patient not taking: Reported on 03/20/2023), Disp: 90 tablet, Rfl: 3 .  diclofenac (VOLTAREN) 75 MG EC tablet, Take 75 mg by mouth 2 (two) times daily. (Patient not taking: Reported on 03/20/2023), Disp: , Rfl:  .  donepezil (ARICEPT) 10 MG tablet, Take 1 tablet (10 mg total) by  mouth daily., Disp: 30 tablet, Rfl: 11 .  furosemide (LASIX) 40 MG tablet, Lasix 40 mg once Tuesday, Thursday, Saturday and Sunday. Lasix 40 mg twice Monday, Wednesday and Friday, Disp: 90 tablet, Rfl: 3 .  lisinopril (PRINIVIL,ZESTRIL) 10 MG tablet, Take 10 mg by mouth daily. (Patient not taking: Reported on 03/20/2023), Disp: , Rfl:  .  metolazone (ZAROXOLYN) 2.5 MG tablet, Take 1 tablet (2.5 mg total) by mouth once a week. Take 30 minutes before Lasix., Disp: 13 tablet, Rfl: 3 .  oxybutynin (DITROPAN-XL) 10 MG 24 hr tablet, Take by mouth. (Patient not taking: Reported on 04/03/2023), Disp: , Rfl:  .  rivaroxaban (XARELTO) 20 MG TABS tablet, Take 1 tablet (20 mg total) by mouth daily with supper., Disp: , Rfl:  .  rosuvastatin (CRESTOR) 10 MG tablet, Take 1 tablet (10 mg total) by mouth every other day. (Patient not taking: Reported on 03/20/2023), Disp: 30 tablet, Rfl: 11 .  sildenafil (REVATIO) 20 MG tablet, SMARTSIG:2-5 Tablet(s) By Mouth PRN, Disp: , Rfl:  .  sotalol (BETAPACE) 80 MG tablet, Take 80 mg by mouth every evening. (Patient not taking: Reported on 03/20/2023), Disp: , Rfl:  .  XARELTO 20 MG TABS tablet, TAKE 1 TABLET BY MOUTH ONCE DAILY WITH SUPPER (Patient taking differently: Take 20 mg by mouth at bedtime.), Disp:  30 tablet, Rfl: 6   Allergies  Allergen Reactions  . Clopidogrel Anaphylaxis and Other (See Comments)    "Wierd, like out of body thing" Out of body experience   . Plavix [Clopidogrel Bisulfate]      Review of Systems  Constitutional: Negative.   HENT: Negative.    Eyes: Negative.   Respiratory: Negative.    Cardiovascular: Negative.   Gastrointestinal: Negative.     There were no vitals filed for this visit. There is no height or weight on file to calculate BMI.  Wt Readings from Last 3 Encounters:  07/28/23 286 lb (129.7 kg)  05/12/23 232 lb (105.2 kg)  04/03/23 257 lb 3.2 oz (116.7 kg)    The ASCVD Risk score (Arnett DK, et al., 2019) failed to calculate  for the following reasons:   The patient has a prior MI or stroke diagnosis  Objective:  Physical Exam      Assessment And Plan:  Hypertension, unspecified type  Mild dementia without behavioral disturbance, psychotic disturbance, mood disturbance, or anxiety, unspecified dementia type (HCC)    No follow-ups on file.  Patient was given opportunity to ask questions. Patient verbalized understanding of the plan and was able to repeat key elements of the plan. All questions were answered to their satisfaction.    Jeanell Sparrow, FNP, have reviewed all documentation for this visit. The documentation on 08/14/23 for the exam, diagnosis, procedures, and orders are all accurate and complete.   IF YOU HAVE BEEN REFERRED TO A SPECIALIST, IT MAY TAKE 1-2 WEEKS TO SCHEDULE/PROCESS THE REFERRAL. IF YOU HAVE NOT HEARD FROM US/SPECIALIST IN TWO WEEKS, PLEASE GIVE [place] A CALL AT [phone].

## 2023-08-15 ENCOUNTER — Other Ambulatory Visit: Payer: Medicare Other

## 2023-08-18 ENCOUNTER — Other Ambulatory Visit: Payer: Self-pay

## 2023-08-21 ENCOUNTER — Ambulatory Visit: Payer: Medicare Other | Attending: Cardiology | Admitting: Cardiology

## 2023-08-21 ENCOUNTER — Encounter: Payer: Self-pay | Admitting: Cardiology

## 2023-08-21 VITALS — BP 124/86 | HR 89 | Ht 74.0 in | Wt 284.5 lb

## 2023-08-21 DIAGNOSIS — I4821 Permanent atrial fibrillation: Secondary | ICD-10-CM | POA: Insufficient documentation

## 2023-08-21 DIAGNOSIS — N1832 Chronic kidney disease, stage 3b: Secondary | ICD-10-CM

## 2023-08-21 DIAGNOSIS — I5022 Chronic systolic (congestive) heart failure: Secondary | ICD-10-CM | POA: Diagnosis not present

## 2023-08-21 DIAGNOSIS — E669 Obesity, unspecified: Secondary | ICD-10-CM | POA: Diagnosis present

## 2023-08-21 DIAGNOSIS — I2510 Atherosclerotic heart disease of native coronary artery without angina pectoris: Secondary | ICD-10-CM | POA: Insufficient documentation

## 2023-08-21 DIAGNOSIS — I255 Ischemic cardiomyopathy: Secondary | ICD-10-CM | POA: Diagnosis present

## 2023-08-21 DIAGNOSIS — I10 Essential (primary) hypertension: Secondary | ICD-10-CM | POA: Insufficient documentation

## 2023-08-21 MED ORDER — RIVAROXABAN 20 MG PO TABS: 20.0000 mg | ORAL_TABLET | Freq: Every day | ORAL | Status: DC

## 2023-08-21 NOTE — Progress Notes (Signed)
Cardiology Office Note:    Date:  08/21/2023   ID:  Connor Mina., DOB 10-20-1956, MRN [id number]  PCP:  [name], FNP  Cardiologist:  Thomasene Ripple, DO  Electrophysiologist:  None   Referring MD: [name], FNP    " I am ok"  History of Present Illness:    Connor Hawkes. is a 67 y.o. male with a hx of hx of coronary artery disease status post coronary artery bypass grafting, ischemic cardiomyopathy EF 30 to 35% on echocardiogram which was done in March 12, 2039, status post ICD placement, hyperlipidemia, long standing persistent atrial fibrillation on Xarelto, morbid obesity.   At his last visit with me we discussed his abnormal nuclear stress test but he preferred not to purse LHC due to  his cr elevation. Since his visit he was seen in our office due to worsening shortness of breath. During that visit he was started on low dose coreg to optimize GDMT.   Here today with his wife. He offers no complaints.   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  He is to restart his Xarelto on Sunday night.   CAD (coronary artery disease)    CHF (congestive heart failure) 01/14/2017   Intermittent CHF with lower extremity edema.  I have given him Lasix to take on a p.r.n. basis.Formatting of this note might be different from the original. Last Assessment & Plan: Formatting of this note might be different from the original. Intermittent CHF with lower extremity edema.  I have given him Las   Chronic kidney disease 10/27/2020   Stage II   Congestive cardiomyopathy 01/14/2017   Cardiomyopathy with LV dysfunction.Last ejection fraction  40-45% by echocardiography in 2012.Patient currently not taking beta-blocker Ace inhibitor for some reason.We will repeat echocardiography to evaluate LV function then determine the need for afterload reduction and beta-blocker therapy.Formattin   Coronary atherosclerosis 01/14/2017   Status remote CABG in 2004 in [place].Left internal mammary artery to the obtuse marginal.Free Right internal mammary artery to the LAD.Saphenous vein graft to OM 2.Saphenous vein graft to the RCA.  Asymptomatic without chest pain or shortness of breath.Last nuclear stress test May 2018:  Large   DVT (deep venous thrombosis)    Dyslipidemia 05/07/2018   Hematuria 08/10/2015   Hypertension    ICD (implantable cardioverter-defibrillator) battery depletion 06/16/2018   Status post dual-chamber ICD generator change 06/16/2018.Last Assessment & Plan: Formatting of this note might be different from the original.Left infraclavicular incision intact with Dermabond.  There is no pocket hematoma present.  No drainage noted.Device interrogation this a.m. reveals appropriate device function.   Ischemic cardiomyopathy 06/18/2016   Echo 09/14/2011 showing LA 4.9cm, EF 40-45%.  Mild to moderate decreased LV systolic function.  Mild concentric LVH.  LA is moderately dilated.  Mild aortic cusp sclerosis. Aortic valve appears to be bicuspid.B. Concerning for potential dysrhythmia (ventricular tachycardia) especially given documented nonsustained ventricular tachy   Mild dementia, unclear etiology 12/30/2022   Myocardial infarction    Obesity (BMI 30-39.9) 08/10/2015   Onychomycosis 05/10/2014   Painful legs and moving toes of left foot 05/10/2014   Paroxysmal atrial fibrillation 08/08/2015  A. Paroxysmal.B. On Sotalol therapy.Last Assessment & Plan: History of present atrial fibrillation.  Patient will 80 mg twice daily.Also on rivaroxaban 20 mg a day.Patient again refused an ECG.I explained to the patient, that the absence of an EKG,  I canno   Peripheral vascular disease    Shortness of breath 10/27/2020   Exertional   Stroke 10/27/2020   Right frontal infarct per neuroimaging dating back at least to 2013   Syncope 01/14/2017   In the setting of ischemic cardiomyopathy. B. Concerning for potential dysrhythmia (ventricular tachycardia) especially given documented nonsustained ventricular tachycardia - EPS 9/25/201 showing reproducibly inducible nonsustained monomorphic VT up to 10 beat.  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 In the setting of ischemic cardiomyopathy. B. Concerning for potential dysrhythmia (ventricular tachycardia) especially given documented nonsustained ventricular tachycardia - EPS 9/25/201 showing reproducibly inducible nonsustained monomorphic VT up to 10 beat.  Reproducibly inducible nonsustained polymorphic VT.  Inducible VF wi    Past Surgical History:  Procedure Laterality Date   CARDIAC DEFIBRILLATOR PLACEMENT     CORONARY ARTERY BYPASS GRAFT  10/2003   IR RADIOLOGIST EVAL & MGMT  10/26/2020   PR VEIN BYPASS GRAFT,AORTO-FEM-POP      Current Medications: Current Meds  Medication Sig   aspirin EC 81 MG tablet Take 81 mg by mouth at bedtime. Swallow whole.   atorvastatin (LIPITOR) 20 MG tablet Take by mouth.   carvedilol (COREG) 3.125 MG tablet Take 1 tablet (3.125 mg total) by mouth 2 (two) times daily.   cyanocobalamin (VITAMIN B12) 1000 MCG tablet Take 1 tablet (1,000 mcg total) by mouth daily.   cyclobenzaprine (FLEXERIL) 5 MG tablet Take 1 tablet (5 mg total) by mouth at bedtime.   diclofenac (VOLTAREN) 75 MG EC tablet Take 75 mg by mouth 2 (two) times daily.   donepezil (ARICEPT) 10 MG tablet Take 1 tablet (10 mg total) by mouth daily.   furosemide (LASIX) 40 MG tablet Lasix 40 mg once Tuesday, Thursday, Saturday and Sunday. Lasix 40 mg twice  Monday, Wednesday and Friday   metolazone (ZAROXOLYN) 2.5 MG tablet Take 1 tablet (2.5 mg total) by mouth once a week. Take 30 minutes before Lasix.   rivaroxaban (XARELTO) 20 MG TABS tablet Take 1 tablet (20 mg total) by mouth daily with supper.   SV IRON 325 (65 Fe) MG tablet Take 325 mg by mouth 3 (three) times a week.   XARELTO 20 MG TABS tablet TAKE 1 TABLET BY MOUTH ONCE DAILY WITH SUPPER (Patient taking differently: Take 20 mg by mouth at bedtime.)     Allergies:   Clopidogrel and Plavix [clopidogrel bisulfate]   Social History   Socioeconomic History   Marital status: Married    Spouse name: Not on file   Number of children: Not on file   Years of education: 18   Highest education level: Master's degree (e.g., MA, MS, MEng, MEd, MSW, MBA)  Occupational History   Occupation: Chaplain    Comment: [employer]  Tobacco Use   Smoking status: Never   Smokeless tobacco: Never  Vaping Use   Vaping status: Never Used  Substance and Sexual Activity   Alcohol use: No   Drug use: No   Sexual activity: Not on file  Other Topics Concern   Not on file  Social History Narrative   Lives with wife in  a 2 story home.  Has 3 children.     Works as a Metallurgist.     Education: Masters in [employer].   Right handed   Caffeine yes   Social Determinants of Health   Financial Resource Strain: Not on file  Food Insecurity: Not on file  Transportation Needs: Not on file  Physical Activity: Not on file  Stress: Not on file  Social Connections: Not on file     Family History: The patient's family history includes Heart attack in his mother; Heart disease in his mother; Hyperlipidemia in his father and mother; Hypertension in his father and mother; Memory loss in his brother, brother, half-sister, and maternal aunt.  ROS:   Review of Systems  Constitution: Negative for decreased appetite, fever and weight gain.  HENT: Negative for congestion, ear discharge, hoarse voice and sore  throat.   Eyes: Negative for discharge, redness, vision loss in right eye and visual halos.  Cardiovascular: Negative for chest pain, dyspnea on exertion, leg swelling, orthopnea and palpitations.  Respiratory: Negative for cough, hemoptysis, shortness of breath and snoring.   Endocrine: Negative for heat intolerance and polyphagia.  Hematologic/Lymphatic: Negative for bleeding problem. Does not bruise/bleed easily.  Skin: Negative for flushing, nail changes, rash and suspicious lesions.  Musculoskeletal: Negative for arthritis, joint pain, muscle cramps, myalgias, neck pain and stiffness.  Gastrointestinal: Negative for abdominal pain, bowel incontinence, diarrhea and excessive appetite.  Genitourinary: Negative for decreased libido, genital sores and incomplete emptying.  Neurological: Negative for brief paralysis, focal weakness, headaches and loss of balance.  Psychiatric/Behavioral: Negative for altered mental status, depression and suicidal ideas.  Allergic/Immunologic: Negative for HIV exposure and persistent infections.    EKGs/Labs/Other Studies Reviewed:    The following studies were reviewed today:   EKG:  The ekg ordered today demonstrates   Recent Labs: 02/11/2023: TSH 2.670 03/20/2023: BNP 362.8; Hemoglobin 11.1; Platelets 207 04/03/2023: BUN 20; Creatinine, Ser 2.44; Magnesium 1.9; Potassium 3.6; Sodium 140  Recent Lipid Panel    Component Value Date/Time   CHOL 135 08/11/2017 1647   TRIG 68 08/11/2017 1647   HDL 38 (L) 08/11/2017 1647   CHOLHDL 3.6 08/11/2017 1647   LDLCALC 83 08/11/2017 1647    Physical Exam:    VS:  BP 124/86 (BP Location: Left Arm, Patient Position: Sitting, Cuff Size: Large)   Pulse 89   Ht 6\' 2"  (1.88 m)   Wt 284 lb 8 oz (129 kg)   SpO2 99%   BMI 36.53 kg/m     Wt Readings from Last 3 Encounters:  08/21/23 284 lb 8 oz (129 kg)  07/28/23 286 lb (129.7 kg)  05/12/23 232 lb (105.2 kg)     GEN: Well nourished, well developed in no  acute distress HEENT: Normal NECK: No JVD; No carotid bruits LYMPHATICS: No lymphadenopathy CARDIAC: S1S2 noted,RRR, no murmurs, rubs, gallops RESPIRATORY:  Clear to auscultation without rales, wheezing or rhonchi  ABDOMEN: Soft, non-tender, non-distended, +bowel sounds, no guarding. EXTREMITIES: bilateral lower extremity edema, No cyanosis, no clubbing MUSCULOSKELETAL:  No deformity  SKIN: Warm and dry NEUROLOGIC:  Alert and oriented x 3, non-focal PSYCHIATRIC:  Normal affect, good insight  ASSESSMENT:    1. Chronic systolic congestive heart failure (HCC)   2. Ischemic cardiomyopathy   3. Primary hypertension   4. Atrial fibrillation, permanent (HCC)   5. Obesity (BMI 30-39.9)   6. Stage 3b chronic kidney disease (HCC)   7. Coronary artery disease involving native coronary artery of native  heart without angina pectoris    PLAN:    Clinically he appeared to be at his baseline. He does have some leg edema he did not take his diuretic today. He plans to take it at home.   No anginal symptoms. Continue Aspirin, statin   Afib- status post ablation, continue coreg and xeralto. Pending EP evaluation for rhythm control strategy.  Blood pressure at target.  The patient is in agreement with the above plan. The patient left the office in stable condition.  The patient will follow up in   Medication Adjustments/Labs and Tests Ordered: Current medicines are reviewed at length with the patient today.  Concerns regarding medicines are outlined above.  Orders Placed This Encounter  Procedures   EKG 12-Lead   Meds ordered this encounter  Medications   rivaroxaban (XARELTO) 20 MG TABS tablet    Sig: Take 1 tablet (20 mg total) by mouth daily with supper.    Order Specific Question:   Lot Number?    Answer:   23mg 470    Order Specific Question:   Expiration Date?    Answer:   09/21/2025    Order Specific Question:   Quantity    Answer:   14    Patient Instructions  Medication  Instructions:  Your physician recommends that you continue on your current medications as directed. Please refer to the Current Medication list given to you today.   *If you need a refill on your cardiac medications before your next appointment, please call your pharmacy*     Follow-Up: At [facility], you and your health needs are our priority.  As part of our continuing mission to provide you with exceptional heart care, we have created designated Provider Care Teams.  These Care Teams include your primary Cardiologist (physician) and Advanced Practice Providers (APPs -  Physician Assistants and Nurse Practitioners) who all work together to provide you with the care you need, when you need it.     Your next appointment:   6 month(s)  The format for your next appointment:   In Person  Provider:   Thomasene Ripple, DO     Adopting a Healthy Lifestyle.  Know what a healthy weight is for you (roughly BMI <25) and aim to maintain this   Aim for 7+ servings of fruits and vegetables daily   65-80+ fluid ounces of water or unsweet tea for healthy kidneys   Limit to max 1 drink of alcohol per day; avoid smoking/tobacco   Limit animal fats in diet for cholesterol and heart health - choose grass fed whenever available   Avoid highly processed foods, and foods high in saturated/trans fats   Aim for low stress - take time to unwind and care for your mental health   Aim for 150 min of moderate intensity exercise weekly for heart health, and weights twice weekly for bone health   Aim for 7-9 hours of sleep daily   When it comes to diets, agreement about the perfect plan isnt easy to find, even among the experts. Experts at the [facility] [employer] developed an idea known as the Healthy Eating Plate. Just imagine a plate divided into logical, healthy portions.   The emphasis is on diet quality:   Load up on vegetables and fruits - one-half of your plate: Aim for color and  variety, and remember that potatoes dont count.   Go for whole grains - one-quarter of your plate: Whole wheat, barley, wheat berries, quinoa,  oats, brown rice, and foods made with them. If you want pasta, go with whole wheat pasta.   Protein power - one-quarter of your plate: Fish, chicken, beans, and nuts are all healthy, versatile protein sources. Limit red meat.   The diet, however, does go beyond the plate, offering a few other suggestions.   Use healthy plant oils, such as olive, canola, soy, corn, sunflower and peanut. Check the labels, and avoid partially hydrogenated oil, which have unhealthy trans fats.   If youre thirsty, drink water. Coffee and tea are good in moderation, but skip sugary drinks and limit milk and dairy products to one or two daily servings.   The type of carbohydrate in the diet is more important than the amount. Some sources of carbohydrates, such as vegetables, fruits, whole grains, and beans-are healthier than others.   Finally, stay active  Signed, Thomasene Ripple, DO  08/21/2023 9:25 PM    [facility]

## 2023-08-21 NOTE — Patient Instructions (Signed)
Medication Instructions:  Your physician recommends that you continue on your current medications as directed. Please refer to the Current Medication list given to you today.  *If you need a refill on your cardiac medications before your next appointment, please call your pharmacy*  Follow-Up: At [facility], you and your health needs are our priority.  As part of our continuing mission to provide you with exceptional heart care, we have created designated Provider Care Teams.  These Care Teams include your primary Cardiologist (physician) and Advanced Practice Providers (APPs -  Physician Assistants and Nurse Practitioners) who all work together to provide you with the care you need, when you need it.   Your next appointment:   6 month(s)  The format for your next appointment:   In Person  Provider:   [name], DO    

## 2023-08-26 ENCOUNTER — Ambulatory Visit: Payer: BC Managed Care – PPO | Admitting: Physician Assistant

## 2023-08-27 ENCOUNTER — Encounter: Payer: BC Managed Care – PPO | Admitting: Psychology

## 2023-09-03 ENCOUNTER — Encounter: Payer: BC Managed Care – PPO | Admitting: Psychology

## 2023-09-05 ENCOUNTER — Other Ambulatory Visit: Payer: Medicare Other

## 2023-09-11 ENCOUNTER — Telehealth: Payer: Self-pay

## 2023-09-11 NOTE — Telephone Encounter (Signed)
Patients daughter Colin Mulders called reporting patient is having breathing issues, patient has a few cancellization and no shows. Patient's daughter was encouraged to take him to urgent care or ER due to having breathing issues. Patient reported she couldn't get him up. Patient is scheduled for 09/12/2023

## 2023-09-12 ENCOUNTER — Encounter: Payer: Self-pay | Admitting: Family Medicine

## 2023-09-12 ENCOUNTER — Encounter (HOSPITAL_BASED_OUTPATIENT_CLINIC_OR_DEPARTMENT_OTHER): Payer: Self-pay | Admitting: Emergency Medicine

## 2023-09-12 ENCOUNTER — Emergency Department (HOSPITAL_BASED_OUTPATIENT_CLINIC_OR_DEPARTMENT_OTHER): Payer: Medicare Other | Admitting: Radiology

## 2023-09-12 ENCOUNTER — Inpatient Hospital Stay (HOSPITAL_BASED_OUTPATIENT_CLINIC_OR_DEPARTMENT_OTHER)
Admission: EM | Admit: 2023-09-12 | Discharge: 2023-09-22 | DRG: 291 | Disposition: A | Discharge status: Home / Self Care | Payer: Medicare Other | Attending: Internal Medicine | Admitting: Internal Medicine

## 2023-09-12 ENCOUNTER — Emergency Department (HOSPITAL_BASED_OUTPATIENT_CLINIC_OR_DEPARTMENT_OTHER): Payer: Medicare Other

## 2023-09-12 ENCOUNTER — Ambulatory Visit: Payer: Medicare Other | Admitting: Family Medicine

## 2023-09-12 ENCOUNTER — Other Ambulatory Visit: Payer: Self-pay

## 2023-09-12 VITALS — BP 120/80 | HR 84 | Temp 98.1°F | Ht 74.0 in | Wt 294.0 lb

## 2023-09-12 DIAGNOSIS — D631 Anemia in chronic kidney disease: Secondary | ICD-10-CM | POA: Diagnosis present

## 2023-09-12 DIAGNOSIS — I252 Old myocardial infarction: Secondary | ICD-10-CM

## 2023-09-12 DIAGNOSIS — I3139 Other pericardial effusion (noninflammatory): Secondary | ICD-10-CM | POA: Diagnosis present

## 2023-09-12 DIAGNOSIS — E785 Hyperlipidemia, unspecified: Secondary | ICD-10-CM | POA: Diagnosis present

## 2023-09-12 DIAGNOSIS — N1832 Chronic kidney disease, stage 3b: Secondary | ICD-10-CM | POA: Diagnosis present

## 2023-09-12 DIAGNOSIS — Z7901 Long term (current) use of anticoagulants: Secondary | ICD-10-CM

## 2023-09-12 DIAGNOSIS — I255 Ischemic cardiomyopathy: Secondary | ICD-10-CM | POA: Diagnosis present

## 2023-09-12 DIAGNOSIS — M7989 Other specified soft tissue disorders: Secondary | ICD-10-CM

## 2023-09-12 DIAGNOSIS — R0602 Shortness of breath: Secondary | ICD-10-CM | POA: Diagnosis not present

## 2023-09-12 DIAGNOSIS — Z1152 Encounter for screening for COVID-19: Secondary | ICD-10-CM

## 2023-09-12 DIAGNOSIS — E6609 Other obesity due to excess calories: Secondary | ICD-10-CM

## 2023-09-12 DIAGNOSIS — F03A0 Unspecified dementia, mild, without behavioral disturbance, psychotic disturbance, mood disturbance, and anxiety: Secondary | ICD-10-CM | POA: Diagnosis present

## 2023-09-12 DIAGNOSIS — T502X5A Adverse effect of carbonic-anhydrase inhibitors, benzothiadiazides and other diuretics, initial encounter: Secondary | ICD-10-CM | POA: Diagnosis not present

## 2023-09-12 DIAGNOSIS — E66812 Obesity, class 2: Secondary | ICD-10-CM | POA: Insufficient documentation

## 2023-09-12 DIAGNOSIS — I5021 Acute systolic (congestive) heart failure: Secondary | ICD-10-CM

## 2023-09-12 DIAGNOSIS — I420 Dilated cardiomyopathy: Secondary | ICD-10-CM | POA: Diagnosis present

## 2023-09-12 DIAGNOSIS — I358 Other nonrheumatic aortic valve disorders: Secondary | ICD-10-CM | POA: Diagnosis present

## 2023-09-12 DIAGNOSIS — I5023 Acute on chronic systolic (congestive) heart failure: Secondary | ICD-10-CM | POA: Diagnosis present

## 2023-09-12 DIAGNOSIS — Z6831 Body mass index (BMI) 31.0-31.9, adult: Secondary | ICD-10-CM

## 2023-09-12 DIAGNOSIS — I2510 Atherosclerotic heart disease of native coronary artery without angina pectoris: Secondary | ICD-10-CM | POA: Diagnosis present

## 2023-09-12 DIAGNOSIS — Z951 Presence of aortocoronary bypass graft: Secondary | ICD-10-CM

## 2023-09-12 DIAGNOSIS — I130 Hypertensive heart and chronic kidney disease with heart failure and stage 1 through stage 4 chronic kidney disease, or unspecified chronic kidney disease: Principal | ICD-10-CM | POA: Diagnosis present

## 2023-09-12 DIAGNOSIS — I5043 Acute on chronic combined systolic (congestive) and diastolic (congestive) heart failure: Secondary | ICD-10-CM | POA: Diagnosis not present

## 2023-09-12 DIAGNOSIS — Z7984 Long term (current) use of oral hypoglycemic drugs: Secondary | ICD-10-CM | POA: Diagnosis not present

## 2023-09-12 DIAGNOSIS — Z7982 Long term (current) use of aspirin: Secondary | ICD-10-CM

## 2023-09-12 DIAGNOSIS — Z95810 Presence of automatic (implantable) cardiac defibrillator: Secondary | ICD-10-CM

## 2023-09-12 DIAGNOSIS — Z8673 Personal history of transient ischemic attack (TIA), and cerebral infarction without residual deficits: Secondary | ICD-10-CM

## 2023-09-12 DIAGNOSIS — Z86718 Personal history of other venous thrombosis and embolism: Secondary | ICD-10-CM

## 2023-09-12 DIAGNOSIS — Z79899 Other long term (current) drug therapy: Secondary | ICD-10-CM | POA: Diagnosis not present

## 2023-09-12 DIAGNOSIS — Z83438 Family history of other disorder of lipoprotein metabolism and other lipidemia: Secondary | ICD-10-CM

## 2023-09-12 DIAGNOSIS — Z888 Allergy status to other drugs, medicaments and biological substances status: Secondary | ICD-10-CM

## 2023-09-12 DIAGNOSIS — J9601 Acute respiratory failure with hypoxia: Secondary | ICD-10-CM | POA: Diagnosis present

## 2023-09-12 DIAGNOSIS — I77810 Thoracic aortic ectasia: Secondary | ICD-10-CM | POA: Diagnosis present

## 2023-09-12 DIAGNOSIS — Z8679 Personal history of other diseases of the circulatory system: Secondary | ICD-10-CM

## 2023-09-12 DIAGNOSIS — I509 Heart failure, unspecified: Principal | ICD-10-CM

## 2023-09-12 DIAGNOSIS — I739 Peripheral vascular disease, unspecified: Secondary | ICD-10-CM | POA: Diagnosis present

## 2023-09-12 DIAGNOSIS — Z91141 Patient's other noncompliance with medication regimen due to financial hardship: Secondary | ICD-10-CM

## 2023-09-12 DIAGNOSIS — J90 Pleural effusion, not elsewhere classified: Secondary | ICD-10-CM

## 2023-09-12 DIAGNOSIS — Z6837 Body mass index (BMI) 37.0-37.9, adult: Secondary | ICD-10-CM

## 2023-09-12 DIAGNOSIS — E876 Hypokalemia: Secondary | ICD-10-CM | POA: Diagnosis present

## 2023-09-12 DIAGNOSIS — I700 Atherosclerosis of aorta: Secondary | ICD-10-CM | POA: Diagnosis present

## 2023-09-12 DIAGNOSIS — R0902 Hypoxemia: Secondary | ICD-10-CM | POA: Insufficient documentation

## 2023-09-12 DIAGNOSIS — I4819 Other persistent atrial fibrillation: Secondary | ICD-10-CM | POA: Diagnosis not present

## 2023-09-12 DIAGNOSIS — E871 Hypo-osmolality and hyponatremia: Secondary | ICD-10-CM | POA: Diagnosis present

## 2023-09-12 DIAGNOSIS — I639 Cerebral infarction, unspecified: Secondary | ICD-10-CM | POA: Diagnosis present

## 2023-09-12 DIAGNOSIS — Z8249 Family history of ischemic heart disease and other diseases of the circulatory system: Secondary | ICD-10-CM

## 2023-09-12 DIAGNOSIS — I4821 Permanent atrial fibrillation: Secondary | ICD-10-CM | POA: Diagnosis present

## 2023-09-12 HISTORY — DX: Acute systolic (congestive) heart failure: I50.21

## 2023-09-12 LAB — CBC WITH DIFFERENTIAL/PLATELET
Abs Immature Granulocytes: 0.01 10*3/uL (ref 0.00–0.07)
Basophils Absolute: 0 10*3/uL (ref 0.0–0.1)
Basophils Relative: 1 %
Eosinophils Absolute: 0.1 10*3/uL (ref 0.0–0.5)
Eosinophils Relative: 2 %
HCT: 34.2 % — ABNORMAL LOW (ref 39.0–52.0)
Hemoglobin: 11.5 g/dL — ABNORMAL LOW (ref 13.0–17.0)
Immature Granulocytes: 0 %
Lymphocytes Relative: 21 %
Lymphs Abs: 1.2 10*3/uL (ref 0.7–4.0)
MCH: 27.4 pg (ref 26.0–34.0)
MCHC: 33.6 g/dL (ref 30.0–36.0)
MCV: 81.6 fL (ref 80.0–100.0)
Monocytes Absolute: 1 10*3/uL (ref 0.1–1.0)
Monocytes Relative: 17 %
Neutro Abs: 3.4 10*3/uL (ref 1.7–7.7)
Neutrophils Relative %: 59 %
Platelets: 179 10*3/uL (ref 150–400)
RBC: 4.19 MIL/uL — ABNORMAL LOW (ref 4.22–5.81)
RDW: 19.2 % — ABNORMAL HIGH (ref 11.5–15.5)
WBC: 5.8 10*3/uL (ref 4.0–10.5)
nRBC: 0 % (ref 0.0–0.2)

## 2023-09-12 LAB — BASIC METABOLIC PANEL
Anion gap: 10 (ref 5–15)
BUN: 25 mg/dL — ABNORMAL HIGH (ref 8–23)
CO2: 26 mmol/L (ref 22–32)
Calcium: 8.9 mg/dL (ref 8.9–10.3)
Chloride: 99 mmol/L (ref 98–111)
Creatinine, Ser: 2.18 mg/dL — ABNORMAL HIGH (ref 0.61–1.24)
GFR, Estimated: 32 mL/min — ABNORMAL LOW (ref >60)
Glucose, Bld: 88 mg/dL (ref 70–99)
Potassium: 4.4 mmol/L (ref 3.5–5.1)
Sodium: 135 mmol/L (ref 135–145)

## 2023-09-12 LAB — HEPATIC FUNCTION PANEL
ALT: 20 U/L (ref 0–44)
AST: 41 U/L (ref 15–41)
Albumin: 2.9 g/dL — ABNORMAL LOW (ref 3.5–5.0)
Alkaline Phosphatase: 186 U/L — ABNORMAL HIGH (ref 38–126)
Bilirubin, Direct: 2.7 mg/dL — ABNORMAL HIGH (ref 0.0–0.2)
Indirect Bilirubin: 2.3 mg/dL — ABNORMAL HIGH (ref 0.3–0.9)
Total Bilirubin: 5 mg/dL — ABNORMAL HIGH (ref 0.3–1.2)
Total Protein: 6.9 g/dL (ref 6.5–8.1)

## 2023-09-12 LAB — BRAIN NATRIURETIC PEPTIDE: B Natriuretic Peptide: 664.8 pg/mL — ABNORMAL HIGH (ref 0.0–100.0)

## 2023-09-12 LAB — TROPONIN I (HIGH SENSITIVITY)
Troponin I (High Sensitivity): 39 ng/L — ABNORMAL HIGH (ref <18)
Troponin I (High Sensitivity): 37 ng/L — ABNORMAL HIGH (ref <18)

## 2023-09-12 LAB — SARS CORONAVIRUS 2 BY RT PCR: SARS Coronavirus 2 by RT PCR: NEGATIVE

## 2023-09-12 MED ORDER — VITAMIN B-12 1000 MCG PO TABS
1000.0000 ug | ORAL_TABLET | Freq: Every day | ORAL | Status: DC
  Administered 2023-09-12 – 2023-09-22 (×11): 1000 ug via ORAL
  Filled 2023-09-12 (×11): qty 1

## 2023-09-12 MED ORDER — RIVAROXABAN 20 MG PO TABS
20.0000 mg | ORAL_TABLET | Freq: Every day | ORAL | Status: DC
  Administered 2023-09-12 – 2023-09-13 (×2): 20 mg via ORAL
  Filled 2023-09-12 (×2): qty 1

## 2023-09-12 MED ORDER — ACETAMINOPHEN 650 MG RE SUPP: 650.0000 mg | Freq: Four times a day (QID) | RECTAL | Status: DC | PRN

## 2023-09-12 MED ORDER — FUROSEMIDE 10 MG/ML IJ SOLN
40.0000 mg | Freq: Two times a day (BID) | INTRAMUSCULAR | Status: DC
  Administered 2023-09-12: 40 mg via INTRAVENOUS
  Filled 2023-09-12: qty 4

## 2023-09-12 MED ORDER — DONEPEZIL HCL 10 MG PO TABS
10.0000 mg | ORAL_TABLET | Freq: Every day | ORAL | Status: DC
  Administered 2023-09-13 – 2023-09-22 (×10): 10 mg via ORAL
  Filled 2023-09-12 (×10): qty 1

## 2023-09-12 MED ORDER — ISOSORBIDE MONONITRATE ER 30 MG PO TB24
15.0000 mg | ORAL_TABLET | Freq: Every day | ORAL | Status: DC
  Administered 2023-09-12 – 2023-09-22 (×11): 15 mg via ORAL
  Filled 2023-09-12 (×11): qty 1

## 2023-09-12 MED ORDER — ONDANSETRON HCL 4 MG/2ML IJ SOLN: 4.0000 mg | Freq: Four times a day (QID) | INTRAMUSCULAR | Status: DC | PRN

## 2023-09-12 MED ORDER — ONDANSETRON HCL 4 MG PO TABS: 4.0000 mg | ORAL_TABLET | Freq: Four times a day (QID) | ORAL | Status: DC | PRN

## 2023-09-12 MED ORDER — IPRATROPIUM-ALBUTEROL 0.5-2.5 (3) MG/3ML IN SOLN: 3.0000 mL | Freq: Four times a day (QID) | RESPIRATORY_TRACT | Status: DC | PRN

## 2023-09-12 MED ORDER — FUROSEMIDE 10 MG/ML IJ SOLN
40.0000 mg | Freq: Once | INTRAMUSCULAR | Status: AC
  Administered 2023-09-12: 40 mg via INTRAVENOUS
  Filled 2023-09-12: qty 4

## 2023-09-12 MED ORDER — HYDRALAZINE HCL 25 MG PO TABS
25.0000 mg | ORAL_TABLET | Freq: Three times a day (TID) | ORAL | Status: DC
  Administered 2023-09-12 – 2023-09-21 (×22): 25 mg via ORAL
  Filled 2023-09-12 (×25): qty 1

## 2023-09-12 MED ORDER — ASPIRIN 81 MG PO TBEC
81.0000 mg | DELAYED_RELEASE_TABLET | Freq: Every day | ORAL | Status: DC
  Administered 2023-09-12 – 2023-09-18 (×7): 81 mg via ORAL
  Filled 2023-09-12 (×7): qty 1

## 2023-09-12 MED ORDER — ROSUVASTATIN CALCIUM 5 MG PO TABS
10.0000 mg | ORAL_TABLET | ORAL | Status: DC
  Administered 2023-09-12: 10 mg via ORAL
  Filled 2023-09-12 (×2): qty 2
  Filled 2023-09-12: qty 1

## 2023-09-12 MED ORDER — CARVEDILOL 3.125 MG PO TABS
3.1250 mg | ORAL_TABLET | Freq: Two times a day (BID) | ORAL | Status: DC
  Administered 2023-09-12 – 2023-09-22 (×21): 3.125 mg via ORAL
  Filled 2023-09-12 (×21): qty 1

## 2023-09-12 MED ORDER — ALBUTEROL SULFATE HFA 108 (90 BASE) MCG/ACT IN AERS
2.0000 | INHALATION_SPRAY | Freq: Four times a day (QID) | RESPIRATORY_TRACT | 2 refills | Status: DC | PRN
Start: 2023-09-12 — End: 2023-09-13

## 2023-09-12 MED ORDER — FUROSEMIDE 10 MG/ML IJ SOLN
80.0000 mg | Freq: Two times a day (BID) | INTRAMUSCULAR | Status: DC
  Administered 2023-09-13 – 2023-09-15 (×6): 80 mg via INTRAVENOUS
  Filled 2023-09-12 (×7): qty 8

## 2023-09-12 MED ORDER — ACETAMINOPHEN 325 MG PO TABS
650.0000 mg | ORAL_TABLET | Freq: Four times a day (QID) | ORAL | Status: DC | PRN
  Administered 2023-09-15: 650 mg via ORAL
  Filled 2023-09-12: qty 2

## 2023-09-12 MED ORDER — ATORVASTATIN CALCIUM 10 MG PO TABS: 20.0000 mg | ORAL_TABLET | Freq: Every day | ORAL | Status: DC

## 2023-09-12 MED ORDER — COLCHICINE 0.6 MG PO TABS
0.6000 mg | ORAL_TABLET | Freq: Two times a day (BID) | ORAL | Status: DC
  Administered 2023-09-12 – 2023-09-17 (×10): 0.6 mg via ORAL
  Filled 2023-09-12 (×10): qty 1

## 2023-09-12 MED ORDER — ALBUTEROL SULFATE HFA 108 (90 BASE) MCG/ACT IN AERS: 2.0000 | INHALATION_SPRAY | RESPIRATORY_TRACT | Status: DC | PRN

## 2023-09-12 NOTE — Assessment & Plan Note (Addendum)
Patient presents for evaluation of worsening shortness of breath with exertion and generalized body swelling BNP is elevated and chest x-ray shows bilateral pleural effusions (Rt > Lt) Place patient on Lasix 40 mg IV every 12 May consider thoracentesis for moderate right-sided pleural effusion Continue carvedilol Not on an ACE inhibitor or ARB due to renal insufficiency Will consult cardiology

## 2023-09-12 NOTE — ED Notes (Signed)
Patient in content of a large amount of urine x 2

## 2023-09-12 NOTE — Assessment & Plan Note (Signed)
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

## 2023-09-12 NOTE — Assessment & Plan Note (Signed)
Patient noted to have room air pulse oximetry of 86% with increased work of breathing and is currently on 2 L of oxygen maintaining pulse oximetry of 95%. Acute respiratory failure is most likely secondary to acute on chronic systolic CHF. Will need to assess patient for home oxygen need prior to discharge

## 2023-09-12 NOTE — ED Provider Notes (Signed)
[place] EMERGENCY DEPARTMENT AT [facility] Provider Note   CSN: [id number] Arrival date & time: 09/12/23  1153     History  Chief Complaint  Patient presents with   Shortness of Breath    Connor Burns. is a 67 y.o. male with a medical history includes coronary artery disease status post bypass grafting, ischemic cardiomyopathy with an EF of 30 to 35%, status post ICD placement, hyperlipidemia, longstanding persistent A-fib on Xarelto, morbid obesity, presenting to the ED with shortness of breath.  The patient's family members who are his daughter at bedside reports that he has been having difficulty with shortness of breath, particular dips on exertion for the past several months, perhaps starting around January.  However it is really become more notable in the past week.  The patient has also had a sudden left arm swelling developing over the past week.  The patient denies any chest pain or difficulty breathing.  He reports compliance with all of his medications.  Medical records reports that he has a history of DVT in the past.  Medical records also show that the patient has a history of ventricular tachycardia, nonsustained.  HPI     Home Medications Prior to Admission medications   Medication Sig Start Date End Date Taking? Authorizing Provider  albuterol (VENTOLIN HFA) 108 (90 Base) MCG/ACT inhaler Inhale 2 puffs into the lungs every 6 (six) hours as needed for wheezing or shortness of breath. 09/12/23   Ellender Hose, NP  aspirin EC 81 MG tablet Take 81 mg by mouth at bedtime. Swallow whole.    [provider]  atorvastatin (LIPITOR) 20 MG tablet Take by mouth.    [provider]  carvedilol (COREG) 3.125 MG tablet Take 1 tablet (3.125 mg total) by mouth 2 (two) times daily. 05/12/23 09/12/23  Carlos Levering, NP  colchicine 0.6 MG tablet Take 0.6 mg by mouth 2 (two) times daily. Patient not taking: Reported on 08/21/2023 09/19/20   [provider]  cyanocobalamin (VITAMIN B12) 1000 MCG tablet Take 1 tablet (1,000 mcg total) by mouth daily. 03/03/23   [name], FNP  cyclobenzaprine (FLEXERIL) 5 MG tablet Take 1 tablet (5 mg total) by mouth at bedtime. Patient not taking: Reported on 09/12/2023 10/20/17   Nita Sickle K, DO  diclofenac (VOLTAREN) 75 MG EC tablet Take 75 mg by mouth 2 (two) times daily. Patient not taking: Reported on 09/12/2023 09/21/20   [provider]  donepezil (ARICEPT) 10 MG tablet Take 1 tablet (10 mg total) by mouth daily. Patient not taking: Reported on 09/12/2023 07/28/23   Marcos Eke, PA-C  furosemide (LASIX) 40 MG tablet Lasix 40 mg once Tuesday, Thursday, Saturday and Sunday. Lasix 40 mg twice Monday, Wednesday and Friday 04/03/23   Tobb, Kardie, DO  lisinopril (PRINIVIL,ZESTRIL) 10 MG tablet Take 10 mg by mouth daily. Patient not taking: Reported on 08/21/2023    [provider]  metolazone (ZAROXOLYN) 2.5 MG tablet Take 1 tablet (2.5 mg total) by mouth once a week. Take 30 minutes before Lasix. Patient not taking: Reported on 09/12/2023 04/03/23   Tobb, Kardie, DO  oxybutynin (DITROPAN-XL) 10 MG 24 hr tablet Take by mouth. Patient not taking: Reported on 08/21/2023 10/22/16   [provider]  rivaroxaban (XARELTO) 20 MG TABS tablet Take 1 tablet (20 mg total) by mouth daily with supper. 08/07/23   Tobb, Kardie, DO  rosuvastatin (CRESTOR) 10 MG tablet Take 1 tablet (10 mg total) by mouth every other  day. Patient not taking: Reported on 08/21/2023 08/12/17   [name], MD  sildenafil (REVATIO) 20 MG tablet SMARTSIG:2-5 Tablet(s) By Mouth PRN Patient not taking: Reported on 08/21/2023 03/27/23   [provider]  sotalol (BETAPACE) 80 MG tablet Take 80 mg by mouth every evening. Patient not taking: Reported on 08/21/2023 08/02/17   [provider]  SV IRON 325 (65 Fe) MG tablet Take 325 mg by mouth 3 (three) times a week. Patient not taking: Reported on  09/12/2023 08/06/23   [provider]      Allergies    Clopidogrel and Plavix [clopidogrel bisulfate]    Review of Systems   Review of Systems  Physical Exam Updated Vital Signs BP (!) 145/104   Pulse 77   Temp 97.8 F (36.6 C) (Oral)   Resp 16   SpO2 (!) 89%  Physical Exam Constitutional:      General: He is not in acute distress. HENT:     Head: Normocephalic and atraumatic.  Eyes:     Conjunctiva/sclera: Conjunctivae normal.     Pupils: Pupils are equal, round, and reactive to light.  Cardiovascular:     Rate and Rhythm: Normal rate. Rhythm irregular.  Pulmonary:     Effort: Pulmonary effort is normal. No respiratory distress.  Abdominal:     General: There is no distension.     Tenderness: There is no abdominal tenderness.  Musculoskeletal:     Comments: Significant edema of the left upper extremity  Skin:    General: Skin is warm and dry.  Neurological:     General: No focal deficit present.     Mental Status: He is alert and oriented to person, place, and time. Mental status is at baseline.  Psychiatric:        Mood and Affect: Mood normal.        Behavior: Behavior normal.     ED Results / Procedures / Treatments   Labs (all labs ordered are listed, but only abnormal results are displayed) Labs Reviewed  BASIC METABOLIC PANEL - Abnormal; Notable for the following components:      Result Value   BUN 25 (*)    Creatinine, Ser 2.18 (*)    GFR, Estimated 32 (*)    All other components within normal limits  CBC WITH DIFFERENTIAL/PLATELET - Abnormal; Notable for the following components:   RBC 4.19 (*)    Hemoglobin 11.5 (*)    HCT 34.2 (*)    RDW 19.2 (*)    All other components within normal limits  BRAIN NATRIURETIC PEPTIDE - Abnormal; Notable for the following components:   B Natriuretic Peptide 664.8 (*)    All other components within normal limits  TROPONIN I (HIGH SENSITIVITY) - Abnormal; Notable for the following components:   Troponin  I (High Sensitivity) 39 (*)    All other components within normal limits  SARS CORONAVIRUS 2 BY RT PCR  TROPONIN I (HIGH SENSITIVITY)    EKG EKG Interpretation Date/Time:  Friday September 12 2023 12:11:59 EDT Ventricular Rate:  82 PR Interval:    QRS Duration:  94 QT Interval:  371 QTC Calculation: 434 R Axis:   84  Text Interpretation: Atrial fibrillation Inferior infarct, old Lateral leads are also involved Confirmed by Alvester Chou [id number]) on 09/12/2023 12:33:32 PM  Radiology DG Chest 2 View  Result Date: 09/12/2023 CLINICAL DATA:  Shortness of breath, progressively worsening. EXAM: CHEST - 2 VIEW COMPARISON:  02/11/2023 FINDINGS: Unchanged cardiac silhouette and  mediastinal contours post median sternotomy and CABG. Stable positioning of support apparatus. Suspected increase in size of moderate right and small left-sided pleural effusions with associated bibasilar heterogeneous/consolidative opacities. Pulmonary vasculature is less distinct than present examination. No pneumothorax. No acute osseous abnormalities. IMPRESSION: Suspected pulmonary edema with increase in size of moderate right and small left-sided pleural effusions with associated bibasilar heterogeneous/consolidative opacities, atelectasis versus infection. Electronically Signed   By: Simonne Come M.D.   On: 09/12/2023 14:13   US Venous Img Upper Uni Left  Result Date: 09/12/2023 CLINICAL DATA:  Left upper extremity edema and shortness of breath for the past 9 months. Evaluate for DVT. EXAM: LEFT UPPER EXTREMITY VENOUS DOPPLER ULTRASOUND TECHNIQUE: Gray-scale sonography with graded compression, as well as color Doppler and duplex ultrasound were performed to evaluate the upper extremity deep venous system from the level of the subclavian vein and including the jugular, axillary, basilic, radial, ulnar and upper cephalic vein. Spectral Doppler was utilized to evaluate flow at rest and with distal augmentation maneuvers.  COMPARISON:  None Available. FINDINGS: Contralateral Subclavian Vein: Respiratory phasicity is normal and symmetric with the symptomatic side. No evidence of thrombus. Normal compressibility. Internal Jugular Vein: No evidence of thrombus. Normal compressibility, respiratory phasicity and response to augmentation. Subclavian Vein: No evidence of thrombus. Normal compressibility, respiratory phasicity and response to augmentation. Axillary Vein: No evidence of thrombus. Normal compressibility, respiratory phasicity and response to augmentation. Cephalic Vein: No evidence of thrombus. Normal compressibility, respiratory phasicity and response to augmentation. Basilic Vein: No evidence of thrombus. Normal compressibility, respiratory phasicity and response to augmentation. Brachial Veins: No evidence of thrombus. Normal compressibility, respiratory phasicity and response to augmentation. Radial Veins: No evidence of thrombus. Normal compressibility, respiratory phasicity and response to augmentation. Ulnar Veins: No evidence of thrombus. Normal compressibility, respiratory phasicity and response to augmentation. Other Findings: There is a moderate amount of subcutaneous edema throughout the left upper extremity. IMPRESSION: No evidence of DVT within the left upper extremity. Electronically Signed   By: Simonne Come M.D.   On: 09/12/2023 14:12    Procedures Procedures    Medications Ordered in ED Medications  albuterol (VENTOLIN HFA) 108 (90 Base) MCG/ACT inhaler 2 puff (has no administration in time range)  furosemide (LASIX) injection 40 mg (has no administration in time range)  rivaroxaban (XARELTO) tablet 20 mg (has no administration in time range)  rosuvastatin (CRESTOR) tablet 10 mg (has no administration in time range)  carvedilol (COREG) tablet 3.125 mg (has no administration in time range)    ED Course/ Medical Decision Making/ A&P Clinical Course as of 09/12/23 1501  Fri Sep 12, 2023  1459  Admitted to hospitalist [MT]    Clinical Course User Index [MT] Quamaine Webb, Kermit Balo, MD                                 Medical Decision Making Amount and/or Complexity of Data Reviewed Labs: ordered. Radiology: ordered.  Risk Prescription drug management. Decision regarding hospitalization.   This patient presents to the ED with concern for shortness of breath, dyspnea on exertion. This involves an extensive number of treatment options, and is a complaint that carries with it a high risk of complications and morbidity.  The differential diagnosis includes congestive heart failure of his pleural effusion versus pneumonia versus viral URI versus malignancy versus other  Patient also has unilateral left arm swelling that is diffuse.  Differential would include lymphedema  versus obstructing mass or pulmonary lesion versus DVT.  Co-morbidities that complicate the patient evaluation: History of known congestive heart failure, arrhythmia, at risk of exacerbation  Additional history obtained from the patient's daughters at bedside  I ordered and personally interpreted labs.  The pertinent results include: BNP 664.  Troponin 39.  Creatinine 2.1.  Hemoglobin 11.5.  COVID-negative  I ordered imaging studies including x-ray of the chest I independently visualized and interpreted imaging which showed small to moderate right-sided pleural effusion, potential underlying atelectasis versus infiltrate I agree with the radiologist interpretation  The patient was maintained on a cardiac monitor.  I personally viewed and interpreted the cardiac monitored which showed an underlying rhythm of: A-fib rate controlled  Per my interpretation the patient's ECG shows no acute ischemic findings  I ordered medication including IV lasix for suspected congestive heart failure  I have reviewed the patients home medicines and have made adjustments as needed  Test Considered: Lower clinical suspicion for acute  pulmonary embolism as he is compliant with Xarelto.  After the interventions noted above, I reevaluated the patient and found that they have: stayed the same   I have ordered CT imaging of the chest without contrast to evaluate for potential obstructing lesion or malignancy.  This will also help better characterize the possibility of a pneumonia.  At this time the patient has no fever or white blood cell count elevation to suggest acute bacterial pneumonia.  Given that the patient is hypoxic with new oxygen requirement, with oxygen level 86% on room air at rest, improved on 2 L nasal cannula, I do think he would benefit from medical admission.  He may benefit from a therapeutic and diagnostic thoracentesis of his pleural effusion in the hospital.  He would also benefit from diuresis and Lasix has been ordered.  The patient and his family are in agreement with this plan  Dispostion:  After consideration of the diagnostic results and the patients response to treatment, I feel that the patent would benefit from medical admission.         Final Clinical Impression(s) / ED Diagnoses Final diagnoses:  Congestive heart failure, unspecified HF chronicity, unspecified heart failure type (HCC)  Pleural effusion  Left arm swelling    Rx / DC Orders ED Discharge Orders     None         Terald Sleeper, MD 09/12/23 1501

## 2023-09-12 NOTE — ED Triage Notes (Signed)
Pt reports SOB with exertion since January, progressively worsening. Per daughter at bedside, he has not been able to get to Dr appts in past month, until this morning when they took him to his PMD.  PMD recommended ED evaluation.    Pt able to speak without dyspnea, states SOB worsens when he is up and moving around.  Pt also c/o intermittent left hand swelling since January.  Left hand notably swollen in triage. Pt not dyspneic at present. AAOx4, NID in triage.

## 2023-09-12 NOTE — Consult Note (Addendum)
Cardiology Consultation   Patient ID: Connor Burns. MRN: [id number]; DOB: Feb 08, 1956  Admit date: 09/12/2023 Date of Consult: 09/12/2023  PCP:  [name], FNP   [facility] Providers Cardiologist:  Thomasene Ripple, DO   Patient Profile:   Connor Hux. is a 67 y.o. male with a hx of CAD status post CABG 2004 in [place], chronic HFrEF/ischemic cardiomyopathy status post ICD, longstanding persistent atrial fibrillation, hypertension, hyperlipidemia, CKD, DVT, CVA, morbid obesity, who is being seen 09/12/2023 for the evaluation of CHF exacerbation at the request of Dr. Joylene Igo.  History of Present Illness:   Connor Burns has newly been reestablished with heart care.  He was seen in 2017 in 2018 by Dr. Tenny Craw and most recently seen by first [facility].  He is now back in the area and follows Dr. Servando Salina.  He was last seen by Dr. Servando Salina on 08/21/2023 with discussion of abnormal stress test with decision not to pursue left heart cath due to renal dysfunction.  He has reported EF around 30 to 35% done this year March 12, 2023.  Patient is being evaluated for worsening shortness of breath going on for several months however worse in the last 2 weeks.  He states that he has quit going to the gym because of how he feels now.  In the emergency room he was noted to have hypoxic episodes with oxygen saturations in the mid 80s and has been placed on 2 L via nasal cannula.  BNP 664, creatinine 2.18, minimally elevated troponins that are flat in the 30s.  He does have asymmetrical upper extremity edema worse in his left arm with negative DVT study.  Checks x-ray and CT show evidence of pulmonary congestion and moderate right and small left sided pleural effusion.  There is some consideration of thoracentesis.  He has been started on IV Lasix 40 mg.  He reports not being compliant with his medications due to cost.  He has missed multiple doses of his Xarelto as well as his Lasix.  He does report  responding well to the Lasix at home when he does take it though.  Currently denies any chest pain, palpitations.  Feels like the IV Lasix he was given has improved his shortness of breath some.  Seems to have decent urinary output currently.  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  He is to restart his Xarelto on Sunday night.   CAD (coronary artery disease)    CHF (congestive heart failure) 01/14/2017   Intermittent CHF with lower extremity edema.  I have given him Lasix to take on a p.r.n. basis.Formatting of this note might be different from the original. Last Assessment & Plan: Formatting of this note might be different from the original. Intermittent CHF with lower extremity edema.  I have given him Las   Chronic kidney disease 10/27/2020   Stage II   Congestive cardiomyopathy 01/14/2017   Cardiomyopathy with LV dysfunction.Last ejection fraction 40-45% by echocardiography in 2012.Patient currently not taking beta-blocker Ace inhibitor for some reason.We will repeat echocardiography to evaluate LV function then determine the need for afterload reduction and beta-blocker therapy.[place]  Coronary atherosclerosis 01/14/2017   Status remote CABG in 2004 in [place].Left internal mammary artery to the obtuse marginal.Free Right internal mammary artery to the LAD.Saphenous vein graft to OM 2.Saphenous vein graft to the RCA.  Asymptomatic without chest pain or shortness of breath.Last nuclear stress test May 2018:  Large   DVT (deep venous thrombosis)    Dyslipidemia 05/07/2018   Hematuria 08/10/2015   Hypertension    ICD (implantable  cardioverter-defibrillator) battery depletion 06/16/2018   Status post dual-chamber ICD generator change 06/16/2018.Last Assessment & Plan: Formatting of this note might be different from the original.Left infraclavicular incision intact with Dermabond.  There is no pocket hematoma present.  No drainage noted.Device interrogation this a.m. reveals appropriate device function.   Ischemic cardiomyopathy 06/18/2016   Echo 09/14/2011 showing LA 4.9cm, EF 40-45%.  Mild to moderate decreased LV systolic function.  Mild concentric LVH.  LA is moderately dilated.  Mild aortic cusp sclerosis. Aortic valve appears to be bicuspid.B. Concerning for potential dysrhythmia (ventricular tachycardia) especially given documented nonsustained ventricular tachy   Mild dementia, unclear etiology 12/30/2022   Myocardial infarction    Obesity (BMI 30-39.9) 08/10/2015   Onychomycosis 05/10/2014   Painful legs and moving toes of left foot 05/10/2014   Paroxysmal atrial fibrillation 08/08/2015   A. Paroxysmal.B. On Sotalol therapy.Last Assessment & Plan: History of present atrial fibrillation.  Patient will 80 mg twice daily.Also on rivaroxaban 20 mg a day.Patient again refused an ECG.I explained to the patient, that the absence of an EKG, I canno   Peripheral vascular disease    Shortness of breath 10/27/2020   Exertional   Stroke 10/27/2020   Right frontal infarct per neuroimaging dating back at least to 2013   Syncope 01/14/2017   In the setting of ischemic cardiomyopathy. B. Concerning for potential dysrhythmia (ventricular tachycardia) especially given documented nonsustained ventricular tachycardia - EPS 9/25/201 showing reproducibly inducible nonsustained monomorphic VT up to 10 beat.  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 In the setting of ischemic cardiomyopathy. B.  Concerning for potential dysrhythmia (ventricular tachycardia) especially given documented nonsustained ventricular tachycardia - EPS 9/25/201 showing reproducibly inducible nonsustained monomorphic VT up to 10 beat.  Reproducibly inducible nonsustained polymorphic VT.  Inducible VF wi    Past Surgical History:  Procedure Laterality Date   CARDIAC DEFIBRILLATOR PLACEMENT     CORONARY ARTERY BYPASS GRAFT  10/2003   IR RADIOLOGIST EVAL & MGMT  10/26/2020   PR VEIN BYPASS GRAFT,AORTO-FEM-POP       Inpatient Medications: Scheduled Meds:  aspirin EC  81 mg Oral QHS   carvedilol  3.125 mg Oral BID   colchicine  0.6 mg Oral BID   cyanocobalamin  1,000 mcg Oral Daily   donepezil  10 mg Oral Daily   furosemide  40 mg Intravenous Q12H   rivaroxaban  20 mg Oral Q supper   rosuvastatin  10 mg Oral QODAY   Continuous Infusions:  PRN Meds: acetaminophen **OR** acetaminophen, albuterol, ipratropium-albuterol, ondansetron **OR** ondansetron (ZOFRAN) IV  Allergies:    Allergies  Allergen Reactions   Clopidogrel Anaphylaxis and Other (See Comments)    "Wierd, like out of body thing" Out of body experience    Plavix [Clopidogrel Bisulfate]     Social History:   Social History   Socioeconomic History   Marital status: Married    Spouse name: Not on  file   Number of children: Not on file   Years of education: 47   Highest education level: Master's degree (e.g., MA, MS, MEng, MEd, MSW, MBA)  Occupational History   Occupation: Chaplain    Comment: [employer]  Tobacco Use   Smoking status: Never   Smokeless tobacco: Never  Vaping Use   Vaping status: Never Used  Substance and Sexual Activity   Alcohol use: No   Drug use: No   Sexual activity: Not on file  Other Topics Concern   Not on file  Social History Narrative   Lives with wife in a 2 story home.  Has 3 children.     Works as a Metallurgist.     Education: Masters in [employer].   Right handed   Caffeine yes    Social Determinants of Health   Financial Resource Strain: Not on file  Food Insecurity: Not on file  Transportation Needs: Not on file  Physical Activity: Not on file  Stress: Not on file  Social Connections: Not on file  Intimate Partner Violence: Not on file    Family History:   Family History  Problem Relation Age of Onset   Heart disease Mother        before age 3   Hyperlipidemia Mother    Hypertension Mother    Heart attack Mother    Hyperlipidemia Father    Hypertension Father    Memory loss Brother    Memory loss Brother    Memory loss Half-Sister    Memory loss Maternal Aunt      ROS:  Please see the history of present illness.  All other ROS reviewed and negative.     Physical Exam/Data:   Vitals:   09/12/23 1400 09/12/23 1450 09/12/23 1500 09/12/23 1747  BP: (!) 145/104   (!) 141/95  Pulse: 61 77  78  Resp: 16 16  20   Temp:    98.3 F (36.8 C)  TempSrc:    Oral  SpO2: 97% (!) 89% 100% 100%  Weight:    131.4 kg  Height:    6\' 2"  (1.88 m)   No intake or output data in the 24 hours ending 09/12/23 1929    09/12/2023    5:47 PM 09/12/2023   10:34 AM 08/21/2023    4:40 PM  Last 3 Weights  Weight (lbs) 289 lb 9.6 oz 294 lb 284 lb 8 oz  Weight (kg) 131.362 kg 133.358 kg 129.048 kg     Body mass index is 37.18 kg/m.  General: Obese HEENT: normal Neck: + JVD Vascular: No carotid bruits; Distal pulses 2+ bilaterally Cardiac: Irregular Lungs: Crackles in the bases Abd: soft, nontender, no hepatomegaly  Ext: 2+ edema Musculoskeletal: Asymmetric left arm edema 3+  skin: warm and dry  Neuro:  CNs 2-12 intact, no focal abnormalities noted Psych:  Normal affect   EKG:  The EKG was personally reviewed and demonstrates: Atrial fibrillation heart rate 82.  No acute ST-T wave changes Telemetry:  Telemetry was personally reviewed and demonstrates: Intermittently paced, does not appear to be pacing correctly though.  Relevant CV Studies: Echocardiogram  02/25/2023 Left Ventricle: There is paradoxical septal motion secondary to CABG. The estimated ejection fraction is 30-35%. There is mild concentric LV hypertrophy visualized.   Right Ventricle: ICD present. Cannot determine RV function..   Aortic Valve: There is no evidence of aortic stenosis. There is no evidence of aortic regurgitation.   Mitral Valve: Mild mitral regurgitation  is visualized.   Pericardium: There is no evidence of a pericardial effusion.   Laboratory Data:  High Sensitivity Troponin:   Recent Labs  Lab 09/12/23 1315 09/12/23 1542  TROPONINIHS 39* 37*     Chemistry Recent Labs  Lab 09/12/23 1315  NA 135  K 4.4  CL 99  CO2 26  GLUCOSE 88  BUN 25*  CREATININE 2.18*  CALCIUM 8.9  GFRNONAA 32*  ANIONGAP 10    No results for input(s): "PROT", "ALBUMIN", "AST", "ALT", "ALKPHOS", "BILITOT" in the last 168 hours. Lipids No results for input(s): "CHOL", "TRIG", "HDL", "LABVLDL", "LDLCALC", "CHOLHDL" in the last 168 hours.  Hematology Recent Labs  Lab 09/12/23 1315  WBC 5.8  RBC 4.19*  HGB 11.5*  HCT 34.2*  MCV 81.6  MCH 27.4  MCHC 33.6  RDW 19.2*  PLT 179   Thyroid No results for input(s): "TSH", "FREET4" in the last 168 hours.  BNP Recent Labs  Lab 09/12/23 1315  BNP 664.8*    DDimer No results for input(s): "DDIMER" in the last 168 hours.   Radiology/Studies:  CT Chest Wo Contrast  Result Date: 09/12/2023 CLINICAL DATA:  Swelling of the left upper extremity, concern for obstructing mass EXAM: CT CHEST WITHOUT CONTRAST TECHNIQUE: Multidetector CT imaging of the chest was performed following the standard protocol without IV contrast. RADIATION DOSE REDUCTION: This exam was performed according to the departmental dose-optimization program which includes automated exposure control, adjustment of the mA and/or kV according to patient size and/or use of iterative reconstruction technique. COMPARISON:  None Available. FINDINGS: Cardiovascular: Mild  cardiomegaly. No pericardial effusion. Left chest wall dual lead ICD with leads in the right atrium and right ventricle. Normal caliber thoracic aorta with mild calcified plaque. Severe coronary artery calcifications status post CABG. Mediastinum/Nodes: Esophagus and thyroid are unremarkable. No definite lymphadenopathy seen in the chest, although soft tissue anasarca limits evaluation. Lungs/Pleura: Central airways are patent. Moderate right and small left pleural effusions with adjacent atelectasis. Upper Abdomen: No acute abnormality. Musculoskeletal: Prior median sternotomy with intact sternal wires. Diffuse body wall edema. IMPRESSION: 1. Moderate right and small left pleural effusions. 2. Diffuse body wall edema, likely related to patient volume status. 3. No evidence of obstructing mass. 4. No definite lymphadenopathy seen in the chest, although soft tissue anasarca limits evaluation. 5. Aortic Atherosclerosis (ICD10-I70.0). Electronically Signed   By: Allegra Lai M.D.   On: 09/12/2023 16:21   DG Chest 2 View  Result Date: 09/12/2023 CLINICAL DATA:  Shortness of breath, progressively worsening. EXAM: CHEST - 2 VIEW COMPARISON:  02/11/2023 FINDINGS: Unchanged cardiac silhouette and mediastinal contours post median sternotomy and CABG. Stable positioning of support apparatus. Suspected increase in size of moderate right and small left-sided pleural effusions with associated bibasilar heterogeneous/consolidative opacities. Pulmonary vasculature is less distinct than present examination. No pneumothorax. No acute osseous abnormalities. IMPRESSION: Suspected pulmonary edema with increase in size of moderate right and small left-sided pleural effusions with associated bibasilar heterogeneous/consolidative opacities, atelectasis versus infection. Electronically Signed   By: Simonne Come M.D.   On: 09/12/2023 14:13   US Venous Img Upper Uni Left  Result Date: 09/12/2023 CLINICAL DATA:  Left upper  extremity edema and shortness of breath for the past 9 months. Evaluate for DVT. EXAM: LEFT UPPER EXTREMITY VENOUS DOPPLER ULTRASOUND TECHNIQUE: Gray-scale sonography with graded compression, as well as color Doppler and duplex ultrasound were performed to evaluate the upper extremity deep venous system from the level of the subclavian vein and including the  jugular, axillary, basilic, radial, ulnar and upper cephalic vein. Spectral Doppler was utilized to evaluate flow at rest and with distal augmentation maneuvers. COMPARISON:  None Available. FINDINGS: Contralateral Subclavian Vein: Respiratory phasicity is normal and symmetric with the symptomatic side. No evidence of thrombus. Normal compressibility. Internal Jugular Vein: No evidence of thrombus. Normal compressibility, respiratory phasicity and response to augmentation. Subclavian Vein: No evidence of thrombus. Normal compressibility, respiratory phasicity and response to augmentation. Axillary Vein: No evidence of thrombus. Normal compressibility, respiratory phasicity and response to augmentation. Cephalic Vein: No evidence of thrombus. Normal compressibility, respiratory phasicity and response to augmentation. Basilic Vein: No evidence of thrombus. Normal compressibility, respiratory phasicity and response to augmentation. Brachial Veins: No evidence of thrombus. Normal compressibility, respiratory phasicity and response to augmentation. Radial Veins: No evidence of thrombus. Normal compressibility, respiratory phasicity and response to augmentation. Ulnar Veins: No evidence of thrombus. Normal compressibility, respiratory phasicity and response to augmentation. Other Findings: There is a moderate amount of subcutaneous edema throughout the left upper extremity. IMPRESSION: No evidence of DVT within the left upper extremity. Electronically Signed   By: Simonne Come M.D.   On: 09/12/2023 14:12     Assessment and Plan:   Acute on chronic HFrEF status  post ICD Moderate right-sided pleural effusion Ischemic cardiomyopathy EF 30 to 35% based off limited echocardiogram in March 2024.  Clearly has signs of hypervolemia with 2+ pitting edema, JVD, crackles.  CT also shows moderate right and small left pleural effusion.  He is on supplemental oxygen he has been inconsistent with his medications due to financial restraints.  GDMT limited by his renal disease. Can continue carvedilol 3.125 mg twice daily as he appears to be compensating.  However if he shows signs of low output will need to discontinue this. Being diuresed IV Lasix 40 mg will increase this to 80 mg twice daily.  PTA was on Lasix 40 mg twice daily, Metolazone once a week. Can titrate GDMT as tolerated after diuresis. Echocardiogram pending Weight on admission to 289.6.  Reported to be around 270 however I am not sure how accurate this is. Potential plans for thoracentesis for pleural effusion. Does not seem to be pacing correctly.  Will need to interrogate device tomorrow.  Does not look like this has been routinely done.  CAD status post CABG 2004 Has previous abnormal nuclear stress test with deferment to pursue left heart catheterization.  Continue aspirin, beta-blocker, statin  Acute respiratory failure with Desaturates in the 80s without oxygen.  Currently on 2 L via nasal cannula and comfortable.  Longstanding persistent atrial fibrillation Currently being paced intermittently. Continue Xarelto, has not been taking this consistently.  On beta-blocker.  CKD Creatinine 2.18.  Difficult to determine baseline however seems to be around 2.0-2.4.   Risk Assessment/Risk Scores:   New York Heart Association (NYHA) Functional Class NYHA Class III  CHA2DS2-VASc Score = 4  } This indicates a 4.8% annual risk of stroke. The patient's score is based upon: CHF History: 1 HTN History: 1 Diabetes History: 0 Stroke History: 0 Vascular Disease History: 1 Age Score: 1 Gender  Score: 0    For questions or updates, please contact [facility] Please consult [email or web address] for contact info under    Signed, Abagail Kitchens, PA-C  09/12/2023 7:29 PM   Attending note     Patient seen and discussed with PA Rolly Salter, I agree with his documentation. 67 yo male history of CAD with prior CABG, ICM LVEF 30-35%, AICD, HLD,  long standing persistent afib, CKD 3B, presented with SOB. Sats down to 86% on RA       K 4.4 BUN 25 Cr 2.18 WBC 5.8 Hgb 11.5 Plt 179 BNP 664 Trop 39-->37 CXR: suspected pulmonary edema, moderate right effusion and small left effusion.  CT chest: Moderate right and small left pleural effusions Diffuse body wall edema, likely related to patient volume status.  EKG rate controlled afib   09/2017 echo: LVEF 40-45%, grade II dd, , PASP 57, normal RV function 2019 echo [facility]  LVEF 30-35% 02/2023 echo [facility]: LVEF 30-35%, cannot determine RV function,  02/2023 nuclear stress [facility]: Markedly abnormal myocardial perfusion scan with a large area of infarction in the inferior wall without reversible ischemia.        1.Acute on chronic HFrEF - long history of ICM, LVEF most recently 30-35% by 02/2023 echo. Has been in this range at least since 2019. - BNP 664, CT and CXR suggesting HF - started on IV lasix 80mg  bid, follow diuresis - medical therapy limited by CKD. GFR on admit 32. COntinue coreg 3.125 mg bid. Hypertensive 140s/100s, start hydral 25mg  tid and imdur 15mg  daily. Pending renal function reconsider ACE/ARB/ARNI/SGLT2i later in admission. If stays on imdur have to d/c his home sildenafil though med list states not taking.  - repeat echo       2.CAD - abnormal nuclear stress test. Patient favored medical management due to renal dysfunction and contrast dye nephropathy risk - no acute issues, continue medical therapy.  - on asa and xarelto as outpatient, defer long term regimen plan to his primary cardiologist.           3.Longstanding persistent afib - prior ablation - from Dr Mallory Shirk note plans for outpatient EP evaluation to consider rhythm strategy, for now continue rate control -CrCl is 47, if sustained <50 this admit would need to lower xarelto to 15mg  daily.   4. AICD - subtle inappropriate pacer spikes by EKG not conducting, more notable on telemetry. Will need ICD checked in the AM - Medtronic Protecta XT DR model#[id number]    5. CKD 3B - follow renal function with diuresis - avoid nephrotoxic meds     Dina Rich MD

## 2023-09-12 NOTE — Assessment & Plan Note (Signed)
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

## 2023-09-12 NOTE — Progress Notes (Unsigned)
I,[name], CMA,acting as a Neurosurgeon for [employer], NP.,have documented all relevant documentation on the behalf of Ellender Hose, NP,as directed by  Ellender Hose, NP while in the presence of Ellender Hose, NP.  Subjective:  Patient ID: Connor Burns. , male    DOB: 10-18-1956 , 67 y.o.   MRN: [id number]  Chief Complaint  Patient presents with   Shortness of Breath    HPI  Patient is a 67 year old male that presents today with shortness of breath.Patient had to be assisted with a wheelchair to the room because he had to stop and catch his breathe when he was walking to the room. Patient's daughter reports the SOB has been going on since January but had gotten worse in the past few days. Patient's left arm is very swollen, daughter reports the swelling started in January, now the swelling is from his fingers to his shoulders, patient was advised to go to the ER to get doppler or [place] of his left arm done to rule out blood clots. Patient was a little reluctant but daughter voiced understanding.Patient has gained 10 lbs since he was weighed 1 month ago.     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  He is to restart his Xarelto on Sunday night.   CAD (coronary artery disease)    CHF (congestive heart failure) 01/14/2017   Intermittent CHF with lower extremity edema.  I have given him Lasix to take on a p.r.n. basis.Formatting of this note might be different from the original. Last Assessment & Plan: Formatting of this note might be different from the original. Intermittent CHF with lower extremity edema.  I have given him Las   Chronic  kidney disease 10/27/2020   Stage II   Congestive cardiomyopathy 01/14/2017   Cardiomyopathy with LV dysfunction.Last ejection fraction 40-45% by echocardiography in 2012.Patient currently not taking beta-blocker Ace inhibitor for some reason.We will repeat echocardiography to evaluate LV function then determine the need for afterload reduction and beta-blocker therapy.[place]   Coronary atherosclerosis 01/14/2017   Status remote CABG in 2004 in [place].Left internal mammary artery to the obtuse marginal.Free Right internal mammary artery to the LAD.Saphenous vein graft to OM 2.Saphenous vein graft to the RCA.  Asymptomatic without chest pain or shortness of breath.Last nuclear stress test May 2018:  Large   DVT (deep venous thrombosis)    Dyslipidemia 05/07/2018   Hematuria 08/10/2015   Hypertension    ICD (implantable cardioverter-defibrillator) battery depletion 06/16/2018   Status post dual-chamber ICD generator change 06/16/2018.Last Assessment & Plan: Formatting of this note might be different from the original.Left infraclavicular incision intact with Dermabond.  There is no pocket hematoma present.  No drainage noted.Device interrogation this a.m. reveals appropriate device function.   Ischemic cardiomyopathy 06/18/2016   Echo 09/14/2011 showing LA 4.9cm, EF 40-45%.  Mild to moderate decreased LV systolic function.  Mild concentric LVH.  LA is moderately dilated.  Mild aortic cusp sclerosis. Aortic valve appears to be bicuspid.B. Concerning for potential dysrhythmia (ventricular tachycardia) especially given documented nonsustained ventricular tachy   Mild dementia, unclear etiology 12/30/2022   Myocardial infarction    Obesity (  BMI 30-39.9) 08/10/2015   Onychomycosis 05/10/2014   Painful legs and moving toes of left foot 05/10/2014   Paroxysmal atrial fibrillation 08/08/2015   A. Paroxysmal.B. On Sotalol therapy.Last Assessment & Plan: History of present atrial fibrillation.  Patient  will 80 mg twice daily.Also on rivaroxaban 20 mg a day.Patient again refused an ECG.I explained to the patient, that the absence of an EKG, I canno   Peripheral vascular disease    Shortness of breath 10/27/2020   Exertional   Stroke 10/27/2020   Right frontal infarct per neuroimaging dating back at least to 2013   Syncope 01/14/2017   In the setting of ischemic cardiomyopathy. B. Concerning for potential dysrhythmia (ventricular tachycardia) especially given documented nonsustained ventricular tachycardia - EPS 9/25/201 showing reproducibly inducible nonsustained monomorphic VT up to 10 beat.  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 In the setting of ischemic cardiomyopathy. B. Concerning for potential dysrhythmia (ventricular tachycardia) especially given documented nonsustained ventricular tachycardia - EPS 9/25/201 showing reproducibly inducible nonsustained monomorphic VT up to 10 beat.  Reproducibly inducible nonsustained polymorphic VT.  Inducible VF wi     Family History  Problem Relation Age of Onset   Heart disease Mother        before age 70   Hyperlipidemia Mother    Hypertension Mother    Heart attack Mother    Hyperlipidemia Father    Hypertension Father    Memory loss Brother    Memory loss Brother    Memory loss Half-Sister    Memory loss Maternal Aunt     No current facility-administered medications for this visit.  Current Outpatient Medications:    albuterol (VENTOLIN HFA) 108 (90 Base) MCG/ACT inhaler, Inhale 2 puffs into the lungs every 6 (six) hours as needed for wheezing or shortness of breath., Disp: 8 g, Rfl: 2   aspirin EC 81 MG tablet, Take 81 mg by mouth at bedtime. Swallow whole., Disp: , Rfl:    atorvastatin (LIPITOR) 20 MG tablet, Take by mouth., Disp: , Rfl:    carvedilol (COREG) 3.125 MG tablet, Take 1 tablet  (3.125 mg total) by mouth 2 (two) times daily., Disp: 180 tablet, Rfl: 3   cyanocobalamin (VITAMIN B12) 1000 MCG tablet, Take 1 tablet (1,000 mcg total) by mouth daily., Disp: 30 tablet, Rfl: 2   furosemide (LASIX) 40 MG tablet, Lasix 40 mg once Tuesday, Thursday, Saturday and Sunday. Lasix 40 mg twice Monday, Wednesday and Friday, Disp: 90 tablet, Rfl: 3   rivaroxaban (XARELTO) 20 MG TABS tablet, Take 1 tablet (20 mg total) by mouth daily with supper., Disp: , Rfl:    colchicine 0.6 MG tablet, Take 0.6 mg by mouth 2 (two) times daily. (Patient not taking: Reported on 08/21/2023), Disp: , Rfl:    cyclobenzaprine (FLEXERIL) 5 MG tablet, Take 1 tablet (5 mg total) by mouth at bedtime. (Patient not taking: Reported on 09/12/2023), Disp: 90 tablet, Rfl: 3   diclofenac (VOLTAREN) 75 MG EC tablet, Take 75 mg by mouth 2 (two) times daily. (Patient not taking: Reported on 09/12/2023), Disp: , Rfl:    donepezil (ARICEPT) 10 MG tablet, Take 1 tablet (10 mg total) by mouth daily. (Patient not taking: Reported on 09/12/2023), Disp: 30 tablet, Rfl: 11   lisinopril (PRINIVIL,ZESTRIL) 10 MG tablet, Take 10 mg by mouth daily. (Patient not taking: Reported on 08/21/2023), Disp: , Rfl:  metolazone (ZAROXOLYN) 2.5 MG tablet, Take 1 tablet (2.5 mg total) by mouth once a week. Take 30 minutes before Lasix. (Patient not taking: Reported on 09/12/2023), Disp: 13 tablet, Rfl: 3   oxybutynin (DITROPAN-XL) 10 MG 24 hr tablet, Take by mouth. (Patient not taking: Reported on 08/21/2023), Disp: , Rfl:    rosuvastatin (CRESTOR) 10 MG tablet, Take 1 tablet (10 mg total) by mouth every other day. (Patient not taking: Reported on 08/21/2023), Disp: 30 tablet, Rfl: 11   sildenafil (REVATIO) 20 MG tablet, SMARTSIG:2-5 Tablet(s) By Mouth PRN (Patient not taking: Reported on 08/21/2023), Disp: , Rfl:    sotalol (BETAPACE) 80 MG tablet, Take 80 mg by mouth every evening. (Patient not taking: Reported on 08/21/2023), Disp: , Rfl:    SV IRON 325 (65  Fe) MG tablet, Take 325 mg by mouth 3 (three) times a week. (Patient not taking: Reported on 09/12/2023), Disp: , Rfl:   Facility-Administered Medications Ordered in Other Visits:    albuterol (VENTOLIN HFA) 108 (90 Base) MCG/ACT inhaler 2 puff, 2 puff, Inhalation, Q2H PRN, Trifan, Kermit Balo, MD   Allergies  Allergen Reactions   Clopidogrel Anaphylaxis and Other (See Comments)    "Wierd, like out of body thing" Out of body experience    Plavix [Clopidogrel Bisulfate]      Review of Systems  Constitutional:  Positive for activity change and fatigue.  Respiratory:  Positive for shortness of breath.   Cardiovascular: Negative.   Skin:  Positive for color change.       Bilateral lower extremity  Psychiatric/Behavioral:  Positive for behavioral problems.      Today's Vitals   09/12/23 1034  BP: 120/80  Pulse: 84  Temp: 98.1 F (36.7 C)  SpO2: 98%  Weight: 294 lb (133.4 kg)  Height: 6\' 2"  (1.88 m)   Body mass index is 37.75 kg/m.  Wt Readings from Last 3 Encounters:  09/12/23 294 lb (133.4 kg)  08/21/23 284 lb 8 oz (129 kg)  07/28/23 286 lb (129.7 kg)    The ASCVD Risk score (Arnett DK, et al., 2019) failed to calculate for the following reasons:   The patient has a prior MI or stroke diagnosis  Objective:  Physical Exam Constitutional:      Appearance: He is obese.  HENT:     Head: Normocephalic.  Cardiovascular:     Rate and Rhythm: Normal rate.  Musculoskeletal:     Right lower leg: Tenderness present. Edema present.     Left lower leg: Tenderness present. Edema present.  Neurological:     Mental Status: He is alert.         Assessment And Plan:  Shortness of breath -     Ambulatory referral to Pulmonology -     Ambulatory referral to Home Health  Class 2 obesity due to excess calories with body mass index (BMI) of 37.0 to 37.9 in adult, unspecified whether serious comorbidity present  Left arm swelling  Other orders -     Albuterol Sulfate HFA; Inhale  2 puffs into the lungs every 6 (six) hours as needed for wheezing or shortness of breath.  Dispense: 8 g; Refill: 2    Return if symptoms worsen or fail to improve.  Patient was given opportunity to ask questions. Patient verbalized understanding of the plan and was able to repeat key elements of the plan. All questions were answered to their satisfaction.    I, Ellender Hose, NP, have reviewed all documentation for this visit.  The documentation on 09/12/23 for the exam, diagnosis, procedures, and orders are all accurate and complete.   IF YOU HAVE BEEN REFERRED TO A SPECIALIST, IT MAY TAKE 1-2 WEEKS TO SCHEDULE/PROCESS THE REFERRAL. IF YOU HAVE NOT HEARD FROM US/SPECIALIST IN TWO WEEKS, PLEASE GIVE [place] A CALL AT [phone].

## 2023-09-12 NOTE — Assessment & Plan Note (Signed)
Renal function is stable Monitor closely while on diuretics

## 2023-09-12 NOTE — Assessment & Plan Note (Signed)
Continue aspirin and rosuvastatin ?

## 2023-09-12 NOTE — ED Notes (Signed)
Taryn at [facility] will send transport for Bed Ready at [facility] 6E RM# [id number].-ABB(NS)

## 2023-09-12 NOTE — Assessment & Plan Note (Signed)
Continue carvedilol for rate control Continue Xarelto for secondary prophylaxis for an acute stroke

## 2023-09-12 NOTE — H&P (Signed)
History and Physical    Patient: Connor Burns. NWG:[id number] DOB: 11/16/1956 DOA: 09/12/2023 DOS: the patient was seen and examined on 09/12/2023 PCP: [name], FNP  Patient coming from: Home  Chief Complaint:  Chief Complaint  Patient presents with   Shortness of Breath   HPI: Connor Burns. is a 67 y.o. male with medical history significant for chronic systolic CHF with last known LVEF of 30 to 35% from a 2D echocardiogram which was done 03/24, ischemic cardiomyopathy status post AICD placement, dyslipidemia, longstanding persistent A-fib, obesity with a BMI of 37, history of coronary artery disease, history of CVA, history of stage IIIb chronic kidney disease who presents to the emergency room for evaluation of progressively worsening shortness of breath over the last several months.  Shortness of breath is mostly with exertion and per patient's family he is only able to walk very short distances and has to sit down and rest.  He has bilateral lower extremity swelling and also complains of left upper extremity swelling.  According to his wife he has been compliant with his diuretic therapy. He denies having any chest pain, no nausea, no vomiting, no palpitations, no diaphoresis, no headache, no dizziness, no lightheadedness, no abdominal pain, no changes in his bowel habits, no blurred vision, no focal deficit. In the ER patient was noted to be hypoxic with room air pulse oximetry of 86% at rest and was placed on 2 L of oxygen with improvement in his pulse oximetry to 95%. Abnormal labs include BNP 664, creatinine 2.18, troponin 39 >> 37 Left upper extremity venous Doppler shows no evidence of DVT within the left upper extremity. Chest x-ray reviewed by me shows suspected pulmonary edema with increase in size of moderate right and small left-sided pleural effusions with associated bibasilar heterogeneous/consolidative opacities, atelectasis versus infection. CT scan of the chest  without contrast shows moderate right and small left pleural effusions. Diffuse body wall edema, likely related to patient volume status. No evidence of obstructing mass. No definite lymphadenopathy seen in the chest, although soft tissue anasarca limits evaluation. Aortic Atherosclerosis. Twelve-lead EKG reviewed by me shows A-fib with an old inferior infarct Patient received a dose of IV Lasix 40 mg and will be admitted to the hospital for further evaluation.     Review of Systems: As mentioned in the history of present illness. All other systems reviewed and are negative. 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  He is to restart his Xarelto on Sunday night.   CAD (coronary artery disease)    CHF (congestive heart failure) 01/14/2017   Intermittent CHF with lower extremity edema.  I have given him Lasix to take on a p.r.n. basis.Formatting of this note might be different from the original. Last Assessment & Plan: Formatting of this note might be different from the original. Intermittent CHF with lower extremity edema.  I have given him Las   Chronic kidney disease 10/27/2020   Stage II   Congestive cardiomyopathy 01/14/2017   Cardiomyopathy with LV dysfunction.Last ejection fraction 40-45% by echocardiography in 2012.Patient currently not taking beta-blocker Ace inhibitor for some reason.We will repeat echocardiography to evaluate LV  function then determine the need for afterload reduction and beta-blocker therapy.[place]   Coronary atherosclerosis 01/14/2017   Status remote CABG in 2004 in [place].Left internal mammary artery to the  obtuse marginal.Free Right internal mammary artery to the LAD.Saphenous vein graft to OM 2.Saphenous vein graft to the RCA.  Asymptomatic without chest pain or shortness of breath.Last nuclear stress test May 2018:  Large   DVT (deep venous thrombosis)    Dyslipidemia 05/07/2018   Hematuria 08/10/2015   Hypertension    ICD (implantable cardioverter-defibrillator) battery depletion 06/16/2018   Status post dual-chamber ICD generator change 06/16/2018.Last Assessment & Plan: Formatting of this note might be different from the original.Left infraclavicular incision intact with Dermabond.  There is no pocket hematoma present.  No drainage noted.Device interrogation this a.m. reveals appropriate device function.   Ischemic cardiomyopathy 06/18/2016   Echo 09/14/2011 showing LA 4.9cm, EF 40-45%.  Mild to moderate decreased LV systolic function.  Mild concentric LVH.  LA is moderately dilated.  Mild aortic cusp sclerosis. Aortic valve appears to be bicuspid.B. Concerning for potential dysrhythmia (ventricular tachycardia) especially given documented nonsustained ventricular tachy   Mild dementia, unclear etiology 12/30/2022   Myocardial infarction    Obesity (BMI 30-39.9) 08/10/2015   Onychomycosis 05/10/2014   Painful legs and moving toes of left foot 05/10/2014   Paroxysmal atrial fibrillation 08/08/2015   A. Paroxysmal.B. On Sotalol therapy.Last Assessment & Plan: History of present atrial fibrillation.  Patient will 80 mg twice daily.Also on rivaroxaban 20 mg a day.Patient again refused an ECG.I explained to the patient, that the absence of an EKG, I canno   Peripheral vascular disease    Shortness of breath 10/27/2020   Exertional   Stroke 10/27/2020   Right frontal infarct per neuroimaging dating back at least to 2013   Syncope 01/14/2017   In the setting of ischemic cardiomyopathy. B. Concerning for potential dysrhythmia (ventricular tachycardia) especially given documented nonsustained  ventricular tachycardia - EPS 9/25/201 showing reproducibly inducible nonsustained monomorphic VT up to 10 beat.  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 In the setting of ischemic cardiomyopathy. B. Concerning for potential dysrhythmia (ventricular tachycardia) especially given documented nonsustained ventricular tachycardia - EPS 9/25/201 showing reproducibly inducible nonsustained monomorphic VT up to 10 beat.  Reproducibly inducible nonsustained polymorphic VT.  Inducible VF wi   Past Surgical History:  Procedure Laterality Date   CARDIAC DEFIBRILLATOR PLACEMENT     CORONARY ARTERY BYPASS GRAFT  10/2003   IR RADIOLOGIST EVAL & MGMT  10/26/2020   PR VEIN BYPASS GRAFT,AORTO-FEM-POP     Social History:  reports that he has never smoked. He has never used smokeless tobacco. He reports that he does not drink alcohol and does not use drugs.  Allergies  Allergen Reactions   Clopidogrel Anaphylaxis and Other (See Comments)    "Wierd, like out of body thing" Out of body experience    Plavix [Clopidogrel Bisulfate]     Family History  Problem Relation Age of Onset   Heart disease Mother        before age 33   Hyperlipidemia Mother    Hypertension Mother    Heart attack Mother    Hyperlipidemia Father    Hypertension Father    Memory loss Brother    Memory loss Brother    Memory loss Half-Sister    Memory loss Maternal Aunt  Prior to Admission medications   Medication Sig Start Date End Date Taking? Authorizing Provider  albuterol (VENTOLIN HFA) 108 (90 Base) MCG/ACT inhaler Inhale 2 puffs into the lungs every 6 (six) hours as needed for wheezing or shortness of breath. 09/12/23   Ellender Hose, NP  aspirin EC 81 MG tablet Take 81 mg by mouth at bedtime. Swallow whole.    [provider]  atorvastatin (LIPITOR) 20 MG tablet Take by  mouth.    [provider]  carvedilol (COREG) 3.125 MG tablet Take 1 tablet (3.125 mg total) by mouth 2 (two) times daily. 05/12/23 09/12/23  Carlos Levering, NP  colchicine 0.6 MG tablet Take 0.6 mg by mouth 2 (two) times daily. Patient not taking: Reported on 08/21/2023 09/19/20   [provider]  cyanocobalamin (VITAMIN B12) 1000 MCG tablet Take 1 tablet (1,000 mcg total) by mouth daily. 03/03/23   [name], FNP  cyclobenzaprine (FLEXERIL) 5 MG tablet Take 1 tablet (5 mg total) by mouth at bedtime. Patient not taking: Reported on 09/12/2023 10/20/17   Nita Sickle K, DO  diclofenac (VOLTAREN) 75 MG EC tablet Take 75 mg by mouth 2 (two) times daily. Patient not taking: Reported on 09/12/2023 09/21/20   [provider]  donepezil (ARICEPT) 10 MG tablet Take 1 tablet (10 mg total) by mouth daily. Patient not taking: Reported on 09/12/2023 07/28/23   Marcos Eke, PA-C  furosemide (LASIX) 40 MG tablet Lasix 40 mg once Tuesday, Thursday, Saturday and Sunday. Lasix 40 mg twice Monday, Wednesday and Friday 04/03/23   Tobb, Kardie, DO  lisinopril (PRINIVIL,ZESTRIL) 10 MG tablet Take 10 mg by mouth daily. Patient not taking: Reported on 08/21/2023    [provider]  metolazone (ZAROXOLYN) 2.5 MG tablet Take 1 tablet (2.5 mg total) by mouth once a week. Take 30 minutes before Lasix. Patient not taking: Reported on 09/12/2023 04/03/23   Tobb, Kardie, DO  oxybutynin (DITROPAN-XL) 10 MG 24 hr tablet Take by mouth. Patient not taking: Reported on 08/21/2023 10/22/16   [provider]  rivaroxaban (XARELTO) 20 MG TABS tablet Take 1 tablet (20 mg total) by mouth daily with supper. 08/07/23   Tobb, Kardie, DO  rosuvastatin (CRESTOR) 10 MG tablet Take 1 tablet (10 mg total) by mouth every other day. Patient not taking: Reported on 08/21/2023 08/12/17   [name], MD  sildenafil (REVATIO) 20 MG tablet SMARTSIG:2-5 Tablet(s) By Mouth PRN Patient not taking: Reported  on 08/21/2023 03/27/23   [provider]  sotalol (BETAPACE) 80 MG tablet Take 80 mg by mouth every evening. Patient not taking: Reported on 08/21/2023 08/02/17   [provider]  SV IRON 325 (65 Fe) MG tablet Take 325 mg by mouth 3 (three) times a week. Patient not taking: Reported on 09/12/2023 08/06/23   [provider]    Physical Exam: Vitals:   09/12/23 1400 09/12/23 1450 09/12/23 1500 09/12/23 1747  BP: (!) 145/104   (!) 141/95  Pulse: 61 77  78  Resp: 16 16  20   Temp:    98.3 F (36.8 C)  TempSrc:    Oral  SpO2: 97% (!) 89% 100% 100%  Weight:    131.4 kg  Height:    6\' 2"  (1.88 m)   Physical Exam Vitals and nursing note reviewed.  Constitutional:      Appearance: He is obese.  HENT:     Mouth/Throat:     Mouth: Mucous membranes are moist.  Cardiovascular:  Rate and Rhythm: Rhythm irregular.  Pulmonary:     Effort: Pulmonary effort is normal.     Breath sounds: Normal breath sounds.  Abdominal:     General: Bowel sounds are normal.     Palpations: Abdomen is soft.  Musculoskeletal:     Cervical back: Normal range of motion and neck supple.     Right lower leg: Edema present.     Left lower leg: Edema present.     Comments: Left upper extremity swelling  Skin:    General: Skin is warm and dry.  Neurological:     Mental Status: He is alert.     Comments: Oriented to person and place  Psychiatric:        Mood and Affect: Mood normal.        Behavior: Behavior normal.     Data Reviewed: Relevant notes from primary care and specialist visits, past discharge summaries as available in EHR, including Care Everywhere. Prior diagnostic testing as pertinent to current admission diagnoses Updated medications and problem lists for reconciliation ED course, including vitals, labs, imaging, treatment and response to treatment Triage notes, nursing and pharmacy notes and ED provider's notes Notable results as noted in HPI Labs reviewed.   Troponin 39 >> 37, BNP 664, sodium 135, potassium 4.4, chloride 99, bicarb 26, glucose 88, BUN 25, creatinine 2.18, calcium 8.9, white count 5.8, hemoglobin 11.5, hematocrit 34.2, platelet count 179 There are no new results to review at this time.  Assessment and Plan: * Acute respiratory failure with hypoxia (HCC) Patient noted to have room air pulse oximetry of 86% with increased work of breathing and is currently on 2 L of oxygen maintaining pulse oximetry of 95%. Acute respiratory failure is most likely secondary to acute on chronic systolic CHF. Will need to assess patient for home oxygen need prior to discharge  Acute on chronic systolic CHF (congestive heart failure) (HCC) Patient presents for evaluation of worsening shortness of breath with exertion and generalized body swelling BNP is elevated and chest x-ray shows bilateral pleural effusions (Rt > Lt) Place patient on Lasix 40 mg IV every 12 May consider thoracentesis for moderate right-sided pleural effusion Continue carvedilol Not on an ACE inhibitor or ARB due to renal insufficiency Will consult cardiology  Ischemic cardiomyopathy Patient has nonischemic cardiomyopathy with last known LVEF of 30 to 35% status post AICD placement Continue aspirin, carvedilol and rosuvastatin  Atrial fibrillation, permanent (HCC) Continue carvedilol for rate control Continue Xarelto for secondary prophylaxis for an acute stroke  Stage 3b chronic kidney disease (CKD) (HCC) Renal function is stable Monitor closely while on diuretics  Class 2 obesity due to excess calories with body mass index (BMI) of 37.0 to 37.9 in adult Complicates overall prognosis and care Lifestyle modification and exercise has been discussed with patient in detail  Mild dementia, unclear etiology 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  All questions and concerns have been addressed.  They verbalized understanding and agree with the plan.  Severity of Illness: The appropriate patient status for this patient is INPATIENT. Inpatient status is judged to be reasonable and necessary in order to provide the required intensity of service to ensure the patient's safety. The patient's presenting symptoms, physical exam findings,  and initial radiographic and laboratory data in the context of their chronic comorbidities is felt to place them at high risk for further clinical deterioration. Furthermore, it is not anticipated that the patient will be medically stable for discharge from the hospital within 2 midnights of admission.   * I certify that at the point of admission it is my clinical judgment that the patient will require inpatient hospital care spanning beyond 2 midnights from the point of admission due to high intensity of service, high risk for further deterioration and high frequency of surveillance required.*  Author: Lucile Shutters, MD 09/12/2023 6:52 PM  For on call review [email or web address].

## 2023-09-12 NOTE — Assessment & Plan Note (Signed)
Complicates overall prognosis and care ?Lifestyle modification and exercise has been discussed with patient in detail ?

## 2023-09-12 NOTE — ED Notes (Signed)
Pt placed on 3L [place] at this time.

## 2023-09-12 NOTE — ED Notes (Signed)
[facility] at bedside 

## 2023-09-12 NOTE — ED Notes (Signed)
Patient transported to CT 

## 2023-09-12 NOTE — Progress Notes (Incomplete)
Attending note   Patient seen and discussed with PA Rolly Salter, I agree with his documentation. 66 yo male history of CAD with prior CABG, ICM LVEF 30-35%, AICD, HLD, long standing persistent afib, CKD 3B, presented with SOB. Sats down to 86% on RA    K 4.4 BUN 25 Cr 2.18 WBC 5.8 Hgb 11.5 Plt 179 BNP 664 Trop 39-->37 CXR: suspected pulmonary edema, moderate right effusion and small left effusion.  CT chest: Moderate right and small left pleural effusions Diffuse body wall edema, likely related to patient volume status.  EKG rate controlled afib  09/2017 echo: LVEF 40-45%, grade II dd, , PASP 57, normal RV function 2019 echo [facility]  LVEF 30-35% 02/2023 echo [facility]: LVEF 30-35%, cannot determine RV function,  02/2023 nuclear stress [facility]: Markedly abnormal myocardial perfusion scan with a large area of infarction in the inferior wall without reversible ischemia.     1.Acute on chronic HFrEF - long history of ICM, LVEF most recently 30-35% by 02/2023 echo. Has been in this range at least since 2019. - BNP 664, CT and CXR suggesting HF - started on IV lasix 80mg  bid, follow diuresis - medical therapy limited by CKD. GFR on admit 32. COntinue coreg 3.125 mg bid. Hypertensive 140s/100s, start hydral 25mg  tid and imdur 15mg  daily. Pending renal function reconsider ACE/ARB/ARNI/SGLT2i later in admission. If stays on imdur have to d/c his home sildenafil though med list states not taking.  - repeat echo    2.CAD - abnormal nuclear stress test. Patient favored medical management due to renal dysfunction and contrast dye nephropathy risk - no acute issues, continue medical therapy.  - on asa and xarelto as outpatient, defer long term regimen plan to his primary cardiologist.      3.Longstanding persistent afib - prior ablation - from Dr Mallory Shirk note plans for outpatient EP evaluation to consider rhythm strategy, for now continue rate control -CrCl is 47, if sustained <50 this  admit would need to lower xarelto to 15mg  daily.  4. AICD - subtle inappropriate pacer spikes by EKG not conducting, more notable on telemetry. Will need ICD checked in the AM - Med[employer] Protecta XT DR model#[id number]   5. AKI on CKD - follow renal function with diuresis - avoid nephrotoxic meds      Estimated Creatinine Clearance: 47.4 mL/min (A) (by C-G formula based on SCr of 2.18 mg/dL (H)).

## 2023-09-12 NOTE — Treatment Plan (Addendum)
67 yo with hx CAD s/p CABG, HFrEF, s/p ICD, CKD IIIb, atrial fibrillation on xarelto, morbid obesity and multiple other medical issues who presnted to the ED with shortness of breath and left arm swelling.  Per EDP documentation, SOB with exertion worse over past few months, worse in the past week.  New L arm swelling in the past week.  VS notable for hypoxia, requiring 3 L [place].  Labs with Cr 2.18, mild anemia 11.5.  Troponin 39.  CXR with suspected pulm edema and moderate R and small L effusions.  Ultrasound without left upper DVT.  COVID was negative.  Per discussion with EDP, swelling noted to L arm on exam.  No significant LE edema.   [facility] [place] CT scan is pending of the chest at this time.  He's ordered Anyely Cunning dose of lasix.  He's requesting admission for new o2 requirement and consideration of thoracentesis for moderate R sided effusion.  Will admit to [facility] tele floor.  Attention to CT chest and delta troponin which are pending at this time.  Addendum: CT chest with moderate R and small L effusions, diffuse body wall edema, soft tissue anasarca.  Delta troponin flat.  Imaging suggests overload, consider repeat echo, diuresis +/- thoracentesis.

## 2023-09-13 ENCOUNTER — Inpatient Hospital Stay (HOSPITAL_COMMUNITY): Payer: Medicare Other

## 2023-09-13 DIAGNOSIS — M7989 Other specified soft tissue disorders: Secondary | ICD-10-CM | POA: Diagnosis not present

## 2023-09-13 DIAGNOSIS — I5021 Acute systolic (congestive) heart failure: Secondary | ICD-10-CM | POA: Diagnosis not present

## 2023-09-13 DIAGNOSIS — J9601 Acute respiratory failure with hypoxia: Secondary | ICD-10-CM | POA: Diagnosis not present

## 2023-09-13 DIAGNOSIS — I509 Heart failure, unspecified: Secondary | ICD-10-CM | POA: Diagnosis not present

## 2023-09-13 LAB — HIV ANTIBODY (ROUTINE TESTING W REFLEX): HIV Screen 4th Generation wRfx: NONREACTIVE

## 2023-09-13 LAB — BASIC METABOLIC PANEL
Anion gap: 9 (ref 5–15)
BUN: 22 mg/dL (ref 8–23)
CO2: 24 mmol/L (ref 22–32)
Calcium: 8.5 mg/dL — ABNORMAL LOW (ref 8.9–10.3)
Chloride: 101 mmol/L (ref 98–111)
Creatinine, Ser: 2.12 mg/dL — ABNORMAL HIGH (ref 0.61–1.24)
GFR, Estimated: 33 mL/min — ABNORMAL LOW (ref >60)
Glucose, Bld: 95 mg/dL (ref 70–99)
Potassium: 3.6 mmol/L (ref 3.5–5.1)
Sodium: 134 mmol/L — ABNORMAL LOW (ref 135–145)

## 2023-09-13 LAB — CBC
HCT: 30.1 % — ABNORMAL LOW (ref 39.0–52.0)
Hemoglobin: 10.2 g/dL — ABNORMAL LOW (ref 13.0–17.0)
MCH: 26.9 pg (ref 26.0–34.0)
MCHC: 33.9 g/dL (ref 30.0–36.0)
MCV: 79.4 fL — ABNORMAL LOW (ref 80.0–100.0)
Platelets: 168 10*3/uL (ref 150–400)
RBC: 3.79 MIL/uL — ABNORMAL LOW (ref 4.22–5.81)
RDW: 18.8 % — ABNORMAL HIGH (ref 11.5–15.5)
WBC: 4.8 10*3/uL (ref 4.0–10.5)
nRBC: 0 % (ref 0.0–0.2)

## 2023-09-13 LAB — ECHOCARDIOGRAM COMPLETE
Area-P 1/2: 4.71 cm2
Height: 74 in
MV M vel: 2.54 m/s
MV Peak grad: 25.8 mmHg
S' Lateral: 4.5 cm
Weight: 4613.79 oz

## 2023-09-13 MED ORDER — PERFLUTREN LIPID MICROSPHERE
1.0000 mL | INTRAVENOUS | Status: AC | PRN
  Administered 2023-09-13: 8 mL via INTRAVENOUS

## 2023-09-13 MED ORDER — ATORVASTATIN CALCIUM 10 MG PO TABS
10.0000 mg | ORAL_TABLET | Freq: Every day | ORAL | Status: DC
  Administered 2023-09-13 – 2023-09-14 (×2): 10 mg via ORAL
  Filled 2023-09-13 (×2): qty 1

## 2023-09-13 MED ORDER — ORAL CARE MOUTH RINSE: 15.0000 mL | OROMUCOSAL | Status: DC | PRN

## 2023-09-13 NOTE — Plan of Care (Signed)
  Problem: Clinical Measurements: Goal: Ability to maintain clinical measurements within normal limits will improve Outcome: Progressing Goal: Will remain free from infection Outcome: Progressing Goal: Respiratory complications will improve Outcome: Progressing Goal: Cardiovascular complication will be avoided Outcome: Progressing   Problem: Nutrition: Goal: Adequate nutrition will be maintained Outcome: Progressing   Problem: Elimination: Goal: Will not experience complications related to urinary retention Outcome: Progressing   Problem: Safety: Goal: Ability to remain free from injury will improve Outcome: Progressing   Problem: Skin Integrity: Goal: Risk for impaired skin integrity will decrease Outcome: Progressing   Problem: Cardiac: Goal: Ability to achieve and maintain adequate cardiopulmonary perfusion will improve Outcome: Progressing

## 2023-09-13 NOTE — Progress Notes (Signed)
Progress Note   Patient: Connor Burns. ZOX:[id number] DOB: [birth year] DOA: 09/12/2023     1 DOS: the patient was seen and examined on 09/13/2023   Brief hospital course:  67 y.o. male with medical history significant for chronic systolic CHF with last known LVEF of 30 to 35% from a 2D echocardiogram which was done 03/24, ischemic cardiomyopathy status post AICD placement, dyslipidemia, longstanding persistent A-fib, obesity with a BMI of 37, history of coronary artery disease, history of CVA, history of stage IIIb chronic kidney disease who presents to the emergency room for evaluation of progressively worsening shortness of breath over the last several months.  Shortness of breath is mostly with exertion and per patient's family he is only able to walk very short distances and has to sit down and rest.  He has bilateral lower extremity swelling and also complains of left upper extremity swelling.  According to his wife he has been compliant with his diuretic therapy. He denies having any chest pain, no nausea, no vomiting, no palpitations, no diaphoresis, no headache, no dizziness, no lightheadedness, no abdominal pain, no changes in his bowel habits, no blurred vision, no focal deficit. In the ER patient was noted to be hypoxic with room air pulse oximetry of 86% at rest and was placed on 2 L of oxygen with improvement in his pulse oximetry to 95%. Abnormal labs include BNP 664, creatinine 2.18, troponin 39 >> 37 Left upper extremity venous Doppler shows no evidence of DVT within the left upper extremity. Chest x-ray reviewed by me shows suspected pulmonary edema with increase in size of moderate right and small left-sided pleural effusions with associated bibasilar heterogeneous/consolidative opacities, atelectasis versus infection. CT scan of the chest without contrast shows moderate right and small left pleural effusions. Diffuse body wall edema, likely related to patient volume status. No evidence  of obstructing mass. No definite lymphadenopathy seen in the chest, although soft tissue anasarca limits evaluation. Aortic Atherosclerosis. Twelve-lead EKG reviewed by me shows A-fib with an old inferior infarct Patient received a dose of IV Lasix 40 mg and will be admitted to the hospital for further evaluation.    Assessment and Plan: Acute respiratory failure with hypoxia (HCC) -Likely related to pulm edema and pleural effusion from acute CHF exacerbation per below -Cont IV lasix as tolerated -wean O2 as tolerated -Will need to assess patient for home oxygen need prior to discharge   Acute on chronic systolic CHF (congestive heart failure) (HCC) -Patient presents for evaluation of worsening shortness of breath with exertion and generalized body swelling -BNP is elevated and chest x-ray shows bilateral pleural effusions (Rt > Lt) -Continue carvedilol -Not on an ACE inhibitor or ARB due to renal insufficiency -Cardiology following -Continue IV lasix -Follow I/o and daily wts   Ischemic cardiomyopathy Patient has nonischemic cardiomyopathy with last known LVEF of 30 to 35% status post AICD placement Continue aspirin, carvedilol and rosuvastatin -f/u on 2d echo result   Atrial fibrillation, permanent (HCC) Continue carvedilol for rate control Continue Xarelto for secondary prophylaxis for an acute stroke   Stage 3b chronic kidney disease (CKD) (HCC) Renal function is stable Monitor closely while on diuretics   Class 2 obesity due to excess calories with body mass index (BMI) of 37.0 to 37.9 in adult Complicates overall prognosis and care Lifestyle modification and exercise has been discussed with patient in detail   Mild dementia, unclear etiology 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 LUE still swollen  Physical Exam: Vitals:   09/13/23 0320 09/13/23 0522 09/13/23 0531 09/13/23 1319  BP: 114/69  110/68 134/76  Pulse: 94     Resp: 16   16  Temp: 98.6 F (37 C)   97.7 F (36.5 C)  TempSrc: Oral   Oral  SpO2: 92%   99%  Weight:  130.8 kg    Height:       General exam: Awake, laying in bed, in nad Respiratory system: Normal respiratory effort, no wheezing Cardiovascular system: regular rate, s1, s2 Gastrointestinal system: Soft, nondistended, positive BS Central nervous system: CN2-12 grossly intact, strength intact Extremities: Perfused, no clubbing, LUE edematous Skin: Normal skin turgor, no notable skin lesions seen Psychiatry: Mood normal // no visual hallucinations   Data Reviewed:  Labs reviewed: Na 134, K 3.6, Cr 2.12, WBC 4.8, Hgb 10.2, Plts 168  Family Communication: Pt in room, family at bedside  Disposition: Status is: Inpatient Remains inpatient appropriate because: severity of illness  Planned Discharge Destination: Home     Author: Rickey Barbara, MD 09/13/2023 2:17 PM  For on call review [email or web address].

## 2023-09-13 NOTE — Progress Notes (Signed)
  Echocardiogram 2D Echocardiogram has been performed.  Maren Reamer 09/13/2023, 11:41 AM

## 2023-09-13 NOTE — Hospital Course (Signed)
67 y.o. male with medical history significant for chronic systolic CHF with last known LVEF of 30 to 35% from a 2D echocardiogram which was done 03/24, ischemic cardiomyopathy status post AICD placement, dyslipidemia, longstanding persistent A-fib, obesity with a BMI of 37, history of coronary artery disease, history of CVA, history of stage IIIb chronic kidney disease who presents to the emergency room for evaluation of progressively worsening shortness of breath over the last several months.  Shortness of breath is mostly with exertion and per patient's family he is only able to walk very short distances and has to sit down and rest.  He has bilateral lower extremity swelling and also complains of left upper extremity swelling.  According to his wife he has been compliant with his diuretic therapy. He denies having any chest pain, no nausea, no vomiting, no palpitations, no diaphoresis, no headache, no dizziness, no lightheadedness, no abdominal pain, no changes in his bowel habits, no blurred vision, no focal deficit. In the ER patient was noted to be hypoxic with room air pulse oximetry of 86% at rest and was placed on 2 L of oxygen with improvement in his pulse oximetry to 95%. Abnormal labs include BNP 664, creatinine 2.18, troponin 39 >> 37 Left upper extremity venous Doppler shows no evidence of DVT within the left upper extremity. Chest x-ray reviewed by me shows suspected pulmonary edema with increase in size of moderate right and small left-sided pleural effusions with associated bibasilar heterogeneous/consolidative opacities, atelectasis versus infection. CT scan of the chest without contrast shows moderate right and small left pleural effusions. Diffuse body wall edema, likely related to patient volume status. No evidence of obstructing mass. No definite lymphadenopathy seen in the chest, although soft tissue anasarca limits evaluation. Aortic Atherosclerosis. Twelve-lead EKG reviewed by me  shows A-fib with an old inferior infarct Patient received a dose of IV Lasix 40 mg and will be admitted to the hospital for further evaluation.

## 2023-09-13 NOTE — Progress Notes (Signed)
Progress Note  Patient Name: Connor Burns. Date of Encounter: 09/13/2023  Primary Cardiologist: Thomasene Ripple, DO   Subjective   Feeling better. Doesn't remember why he came in.  Inpatient Medications    Scheduled Meds:  aspirin EC  81 mg Oral QHS   carvedilol  3.125 mg Oral BID   colchicine  0.6 mg Oral BID   cyanocobalamin  1,000 mcg Oral Daily   donepezil  10 mg Oral Daily   furosemide  80 mg Intravenous BID   hydrALAZINE  25 mg Oral Q8H   isosorbide mononitrate  15 mg Oral Daily   rivaroxaban  20 mg Oral Q supper   rosuvastatin  10 mg Oral QODAY   Continuous Infusions:  PRN Meds: acetaminophen **OR** acetaminophen, albuterol, ipratropium-albuterol, ondansetron **OR** ondansetron (ZOFRAN) IV, mouth rinse   Vital Signs    Vitals:   09/12/23 2324 09/13/23 0320 09/13/23 0522 09/13/23 0531  BP: (!) 152/91 114/69  110/68  Pulse: (!) 101 94    Resp:  16    Temp:  98.6 F (37 C)    TempSrc:  Oral    SpO2:  92%    Weight:   130.8 kg   Height:        Intake/Output Summary (Last 24 hours) at 09/13/2023 1055 Last data filed at 09/13/2023 0610 Gross per 24 hour  Intake 400 ml  Output 3200 ml  Net -2800 ml   Filed Weights   09/12/23 1747 09/13/23 0522  Weight: 131.4 kg 130.8 kg    Telemetry    AF - Personally Reviewed  ECG    AF  - Personally Reviewed  Physical Exam  Gen: Appears comfortable, well-nourished CV: iRRR, 3+ dependent edema - primarily in Arm. Legs woody. No M/R/G Pulm: breathing easily   Labs    Chemistry Recent Labs  Lab 09/12/23 1315 09/12/23 2242 09/13/23 0450  NA 135  --  134*  K 4.4  --  3.6  CL 99  --  101  CO2 26  --  24  GLUCOSE 88  --  95  BUN 25*  --  22  CREATININE 2.18*  --  2.12*  CALCIUM 8.9  --  8.5*  PROT  --  6.9  --   ALBUMIN  --  2.9*  --   AST  --  41  --   ALT  --  20  --   ALKPHOS  --  186*  --   BILITOT  --  5.0*  --   GFRNONAA 32*  --  33*  ANIONGAP 10  --  9     Hematology Recent Labs  Lab  09/12/23 1315 09/13/23 0450  WBC 5.8 4.8  RBC 4.19* 3.79*  HGB 11.5* 10.2*  HCT 34.2* 30.1*  MCV 81.6 79.4*  MCH 27.4 26.9  MCHC 33.6 33.9  RDW 19.2* 18.8*  PLT 179 168    Cardiac EnzymesNo results for input(s): "TROPONINI" in the last 168 hours. No results for input(s): "TROPIPOC" in the last 168 hours.   BNP Recent Labs  Lab 09/12/23 1315  BNP 664.8*     DDimer No results for input(s): "DDIMER" in the last 168 hours.   Summary of Pertinent studies    TTE: ordered  Cardiac cath: NA  Imaging: CT chest: Moderate right and small left pleural effusions.  Diffuse body wall edema  Labs: Creatinine 2.12.  Potassium 3.6  Patient Profile     67 y.o. male with a hx  of CAD status post CABG 2004 in [place], chronic HFrEF/ischemic cardiomyopathy status post ICD, longstanding persistent atrial fibrillation, hypertension, hyperlipidemia, CKD, DVT, CVA, morbid obesity, who is being seen 09/12/2023 for the evaluation of CHF exacerbation at the request of Dr. Joylene Igo.   Assessment & Plan    Acute on chronic heart failure with reduced ejection fraction History of ischemic cardiomyopathy with an LVEF of 30 to 35% Continue IV diuresis GDMT limited by CKD Hydralazine 25 and Imdur 15 started yesterday (DC sildenafil on discharge) Consider ARB/Arni/SGLT2i if renal function permits  Coronary artery disease status post CABG 2024 Abnormal nuclear stress test in 2017 Coronary angiogram deferred due to renal risk from contrast dye No current evidence of angina, continue medical therapy On aspirin and Xarelto  ICD in place Appears to be a medtronic device Will get device interrogated today There appear to be inappropriate pacing spikes at times on telemetry  Longstanding persistent atrial fibrillation CHA2DS2-VASc score is 4 Continue Xarelto  Acute hypoxic respiratory failure Secondary to CHF  Chronic kidney disease stage IIIb Continue to monitor Cr    For  questions or updates, please contact [facility] Please consult [email or web address] for contact info under Cardiology/STEMI.      Signed, Maurice Small, MD 09/13/2023, 10:55 AM

## 2023-09-13 NOTE — Plan of Care (Signed)
  Problem: Education: Goal: Knowledge of General Education information will improve Description: Including pain rating scale, medication(s)/side effects and non-pharmacologic comfort measures Outcome: Progressing   Problem: Clinical Measurements: Goal: Cardiovascular complication will be avoided Outcome: Progressing   Problem: Activity: Goal: Risk for activity intolerance will decrease Outcome: Progressing   Problem: Nutrition: Goal: Adequate nutrition will be maintained Outcome: Progressing   Problem: Coping: Goal: Level of anxiety will decrease Outcome: Progressing   Problem: Elimination: Goal: Will not experience complications related to urinary retention Outcome: Progressing

## 2023-09-13 NOTE — Plan of Care (Addendum)
Patient placed on room air.

## 2023-09-14 ENCOUNTER — Inpatient Hospital Stay (HOSPITAL_COMMUNITY): Payer: Medicare Other

## 2023-09-14 DIAGNOSIS — M7989 Other specified soft tissue disorders: Secondary | ICD-10-CM | POA: Diagnosis not present

## 2023-09-14 DIAGNOSIS — I509 Heart failure, unspecified: Secondary | ICD-10-CM | POA: Diagnosis not present

## 2023-09-14 DIAGNOSIS — J90 Pleural effusion, not elsewhere classified: Secondary | ICD-10-CM | POA: Diagnosis not present

## 2023-09-14 DIAGNOSIS — J9601 Acute respiratory failure with hypoxia: Secondary | ICD-10-CM | POA: Diagnosis not present

## 2023-09-14 LAB — COMPREHENSIVE METABOLIC PANEL
ALT: 19 U/L (ref 0–44)
AST: 32 U/L (ref 15–41)
Albumin: 2.9 g/dL — ABNORMAL LOW (ref 3.5–5.0)
Alkaline Phosphatase: 177 U/L — ABNORMAL HIGH (ref 38–126)
Anion gap: 14 (ref 5–15)
BUN: 21 mg/dL (ref 8–23)
CO2: 26 mmol/L (ref 22–32)
Calcium: 8.7 mg/dL — ABNORMAL LOW (ref 8.9–10.3)
Chloride: 93 mmol/L — ABNORMAL LOW (ref 98–111)
Creatinine, Ser: 2.2 mg/dL — ABNORMAL HIGH (ref 0.61–1.24)
GFR, Estimated: 32 mL/min — ABNORMAL LOW (ref >60)
Glucose, Bld: 110 mg/dL — ABNORMAL HIGH (ref 70–99)
Potassium: 3.3 mmol/L — ABNORMAL LOW (ref 3.5–5.1)
Sodium: 133 mmol/L — ABNORMAL LOW (ref 135–145)
Total Bilirubin: 4.5 mg/dL — ABNORMAL HIGH (ref 0.3–1.2)
Total Protein: 6.8 g/dL (ref 6.5–8.1)

## 2023-09-14 MED ORDER — OXYMETAZOLINE HCL 0.05 % NA SOLN
2.0000 | Freq: Two times a day (BID) | NASAL | Status: DC | PRN
  Administered 2023-09-14 – 2023-09-16 (×3): 2 via NASAL
  Filled 2023-09-14: qty 30

## 2023-09-14 MED ORDER — RIVAROXABAN 15 MG PO TABS: 15.0000 mg | ORAL_TABLET | Freq: Every day | ORAL | Status: DC

## 2023-09-14 MED ORDER — POTASSIUM CHLORIDE CRYS ER 20 MEQ PO TBCR
40.0000 meq | EXTENDED_RELEASE_TABLET | ORAL | Status: AC
  Administered 2023-09-14 (×2): 40 meq via ORAL
  Filled 2023-09-14: qty 2

## 2023-09-14 MED ORDER — RIVAROXABAN 20 MG PO TABS
20.0000 mg | ORAL_TABLET | Freq: Every day | ORAL | Status: DC
  Administered 2023-09-14 – 2023-09-17 (×4): 20 mg via ORAL
  Filled 2023-09-14 (×4): qty 1

## 2023-09-14 MED ORDER — ATORVASTATIN CALCIUM 10 MG PO TABS
20.0000 mg | ORAL_TABLET | Freq: Every day | ORAL | Status: DC
  Administered 2023-09-15 – 2023-09-22 (×8): 20 mg via ORAL
  Filled 2023-09-14 (×8): qty 2

## 2023-09-14 NOTE — Progress Notes (Signed)
Patient had episodes of mild nosebleed visible on the bed covers and on the floor from blowing his nose. His wife also states he's been having these episodes at home. Latest vitals BP 121/78, HR 87, RR 18, SPO2 96 on RA. Notified Dr. Margo Aye. Afrin nasal spray started. Instructed patient with wife to avoid picking and blowing his nose. Patient verbalizes understanding.

## 2023-09-14 NOTE — Progress Notes (Signed)
Progress Note   Patient: Connor Burns. XBJ:[id number] DOB: 03/23/1956 DOA: 09/12/2023     2 DOS: the patient was seen and examined on 09/14/2023   Brief hospital course:  67 y.o. male with medical history significant for chronic systolic CHF with last known LVEF of 30 to 35% from a 2D echocardiogram which was done 03/24, ischemic cardiomyopathy status post AICD placement, dyslipidemia, longstanding persistent A-fib, obesity with a BMI of 37, history of coronary artery disease, history of CVA, history of stage IIIb chronic kidney disease who presents to the emergency room for evaluation of progressively worsening shortness of breath over the last several months.  Shortness of breath is mostly with exertion and per patient's family he is only able to walk very short distances and has to sit down and rest.  He has bilateral lower extremity swelling and also complains of left upper extremity swelling.  According to his wife he has been compliant with his diuretic therapy. He denies having any chest pain, no nausea, no vomiting, no palpitations, no diaphoresis, no headache, no dizziness, no lightheadedness, no abdominal pain, no changes in his bowel habits, no blurred vision, no focal deficit. In the ER patient was noted to be hypoxic with room air pulse oximetry of 86% at rest and was placed on 2 L of oxygen with improvement in his pulse oximetry to 95%. Abnormal labs include BNP 664, creatinine 2.18, troponin 39 >> 37 Left upper extremity venous Doppler shows no evidence of DVT within the left upper extremity. Chest x-ray reviewed by me shows suspected pulmonary edema with increase in size of moderate right and small left-sided pleural effusions with associated bibasilar heterogeneous/consolidative opacities, atelectasis versus infection. CT scan of the chest without contrast shows moderate right and small left pleural effusions. Diffuse body wall edema, likely related to patient volume status. No evidence  of obstructing mass. No definite lymphadenopathy seen in the chest, although soft tissue anasarca limits evaluation. Aortic Atherosclerosis. Twelve-lead EKG reviewed by me shows A-fib with an old inferior infarct Patient received a dose of IV Lasix 40 mg and will be admitted to the hospital for further evaluation.    Assessment and Plan: Acute respiratory failure with hypoxia (HCC) -Likely related to pulm edema and pleural effusion from acute CHF exacerbation per below -Cont IV lasix as tolerated -wean O2 as tolerated -Will need to assess patient for home oxygen need prior to discharge   Acute on chronic systolic CHF (congestive heart failure) (HCC) -Patient presents for evaluation of worsening shortness of breath with exertion and generalized body swelling -BNP is elevated and chest x-ray shows bilateral pleural effusions (Rt > Lt) -Continue carvedilol -Currently not on an ACE inhibitor or ARB due to renal insufficiency -Cardiology following -Continue IV lasix -Follow I/o and daily wts -consider ARB/Arni/SGLT2i if renal function improves   Ischemic cardiomyopathy Patient has nonischemic cardiomyopathy with last known LVEF of 30 to 35% status post AICD placement Continue aspirin, carvedilol and rosuvastatin -2d echo EF 35-40%   Atrial fibrillation, permanent (HCC) Continue carvedilol for rate control Continue Xarelto for secondary prophylaxis for an acute stroke   Stage 3b chronic kidney disease (CKD) (HCC) Renal function is stable Monitor closely while on diuretics   Class 2 obesity due to excess calories with body mass index (BMI) of 37.0 to 37.9 in adult Complicates overall prognosis and care Lifestyle modification and exercise has been discussed with patient in detail   Mild dementia, unclear etiology 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 Denies sob  Physical Exam: Vitals:   09/14/23 0358 09/14/23 0515 09/14/23 1243 09/14/23 1422  BP: 121/78 (!) 152/80 131/72   Pulse: 77  86   Resp: 18     Temp: 98.5 F (36.9 C)   98.1 F (36.7 C)  TempSrc: Oral   Oral  SpO2: 96%     Weight: 125.6 kg     Height:       General exam: Conversant, in no acute distress Respiratory system: normal chest rise, clear, no audible wheezing Cardiovascular system: regular rhythm, s1-s2 Gastrointestinal system: Nondistended, nontender, pos BS Central nervous system: No seizures, no tremors Extremities: No cyanosis, no joint deformities Skin: No rashes, no pallor Psychiatry: Affect normal // no auditory hallucinations   Data Reviewed:  Labs reviewed: Na 133, K 3.3, Cr 2.20  Family Communication: Pt in room, family at bedside  Disposition: Status is: Inpatient Remains inpatient appropriate because: severity of illness  Planned Discharge Destination: Home     Author: Rickey Barbara, MD 09/14/2023 3:27 PM  For on call review [email or web address].

## 2023-09-14 NOTE — Plan of Care (Signed)
  Problem: Health Behavior/Discharge Planning: Goal: Ability to manage health-related needs will improve Outcome: Progressing   Problem: Clinical Measurements: Goal: Ability to maintain clinical measurements within normal limits will improve Outcome: Progressing Goal: Cardiovascular complication will be avoided Outcome: Progressing   Problem: Activity: Goal: Risk for activity intolerance will decrease Outcome: Progressing   Problem: Nutrition: Goal: Adequate nutrition will be maintained Outcome: Progressing   Problem: Safety: Goal: Ability to remain free from injury will improve Outcome: Progressing   Problem: Skin Integrity: Goal: Risk for impaired skin integrity will decrease Outcome: Progressing   Problem: Cardiac: Goal: Ability to achieve and maintain adequate cardiopulmonary perfusion will improve Outcome: Progressing

## 2023-09-14 NOTE — Progress Notes (Signed)
Progress Note  Patient Name: Connor Burns. Date of Encounter: 09/14/2023  Primary Cardiologist: Thomasene Ripple, DO   Subjective   Feeling better. Doesn't remember why he came in.  Inpatient Medications    Scheduled Meds:  aspirin EC  81 mg Oral QHS   atorvastatin  10 mg Oral Daily   carvedilol  3.125 mg Oral BID   colchicine  0.6 mg Oral BID   cyanocobalamin  1,000 mcg Oral Daily   donepezil  10 mg Oral Daily   furosemide  80 mg Intravenous BID   hydrALAZINE  25 mg Oral Q8H   isosorbide mononitrate  15 mg Oral Daily   potassium chloride  40 mEq Oral Q4H   rivaroxaban  20 mg Oral Q supper   Continuous Infusions:  PRN Meds: acetaminophen **OR** acetaminophen, albuterol, ipratropium-albuterol, ondansetron **OR** ondansetron (ZOFRAN) IV, mouth rinse, oxymetazoline   Vital Signs    Vitals:   09/13/23 1955 09/13/23 2043 09/14/23 0358 09/14/23 0515  BP: 130/85 135/64 121/78 (!) 152/80  Pulse: 87 92 77   Resp: 16  18   Temp: 98.3 F (36.8 C)  98.5 F (36.9 C)   TempSrc: Oral  Oral   SpO2: 95%  96%   Weight:   125.6 kg   Height:        Intake/Output Summary (Last 24 hours) at 09/14/2023 0942 Last data filed at 09/14/2023 0732 Gross per 24 hour  Intake 987 ml  Output 5900 ml  Net -4913 ml   Filed Weights   09/12/23 1747 09/13/23 0522 09/14/23 0358  Weight: 131.4 kg 130.8 kg 125.6 kg    Telemetry    AF, frequent PVCs - Personally Reviewed  ECG    AF  - Personally Reviewed  Physical Exam  Gen: Appears comfortable, well-nourished CV: iRRR, 3+ dependent edema - primarily in Arm. Legs woody. No M/R/G Pulm: breathing easily but sitting upright. No rales   Labs    Chemistry Recent Labs  Lab 09/12/23 1315 09/12/23 2242 09/13/23 0450 09/14/23 0455  NA 135  --  134* 133*  K 4.4  --  3.6 3.3*  CL 99  --  101 93*  CO2 26  --  24 26  GLUCOSE 88  --  95 110*  BUN 25*  --  22 21  CREATININE 2.18*  --  2.12* 2.20*  CALCIUM 8.9  --  8.5* 8.7*  PROT  --   6.9  --  6.8  ALBUMIN  --  2.9*  --  2.9*  AST  --  41  --  32  ALT  --  20  --  19  ALKPHOS  --  186*  --  177*  BILITOT  --  5.0*  --  4.5*  GFRNONAA 32*  --  33* 32*  ANIONGAP 10  --  9 14     Hematology Recent Labs  Lab 09/12/23 1315 09/13/23 0450  WBC 5.8 4.8  RBC 4.19* 3.79*  HGB 11.5* 10.2*  HCT 34.2* 30.1*  MCV 81.6 79.4*  MCH 27.4 26.9  MCHC 33.6 33.9  RDW 19.2* 18.8*  PLT 179 168    Cardiac EnzymesNo results for input(s): "TROPONINI" in the last 168 hours. No results for input(s): "TROPIPOC" in the last 168 hours.   BNP Recent Labs  Lab 09/12/23 1315  BNP 664.8*     DDimer No results for input(s): "DDIMER" in the last 168 hours.   Summary of Pertinent studies    TTE:  Ef slightly improved, 35-40%  Cardiac cath: NA  Imaging: CT chest: Moderate right and small left pleural effusions.  Diffuse body wall edema  Labs: Creatinine 2.2.  Potassium 3.3  Patient Profile     67 y.o. male with a hx of CAD status post CABG 2004 in [place], chronic HFrEF/ischemic cardiomyopathy status post ICD, longstanding persistent atrial fibrillation, hypertension, hyperlipidemia, CKD, DVT, CVA, morbid obesity, who is being seen 09/12/2023 for the evaluation of CHF exacerbation at the request of Dr. Joylene Igo.   Assessment & Plan    Acute on chronic heart failure with reduced ejection fraction History of ischemic cardiomyopathy with an LVEF of 30 to 35% Continue IV diuresis GDMT limited by CKD Hydralazine 25 and Imdur 15 started (DC sildenafil on discharge) Consider ARB/Arni/SGLT2i if renal function permits  Coronary artery disease status post CABG 2024 Abnormal nuclear stress test in 2017 Coronary angiogram deferred due to renal risk from contrast dye No current evidence of angina, continue medical therapy On aspirin and Xarelto  ICD in place medtronic dual chamber ICD RV lead with elevated capture threshold -- does not require pacing There appear to be  inappropriate pacing spikes at times on telemetry - pt undersensing atrial fibrillation, sensitivity was adjusted  Longstanding persistent atrial fibrillation CHA2DS2-VASc score is 4 Rate is controlled Continue Xarelto  Acute hypoxic respiratory failure Secondary to CHF  Chronic kidney disease stage IIIb Continue to monitor Cr    For questions or updates, please contact [facility] Please consult [email or web address] for contact info under Cardiology/STEMI.      Signed, Maurice Small, MD 09/14/2023, 9:42 AM

## 2023-09-15 ENCOUNTER — Encounter (HOSPITAL_COMMUNITY): Payer: Self-pay | Admitting: Family Medicine

## 2023-09-15 ENCOUNTER — Other Ambulatory Visit (HOSPITAL_COMMUNITY): Payer: Self-pay

## 2023-09-15 DIAGNOSIS — J9601 Acute respiratory failure with hypoxia: Secondary | ICD-10-CM

## 2023-09-15 DIAGNOSIS — I509 Heart failure, unspecified: Secondary | ICD-10-CM

## 2023-09-15 DIAGNOSIS — M7989 Other specified soft tissue disorders: Secondary | ICD-10-CM

## 2023-09-15 DIAGNOSIS — J90 Pleural effusion, not elsewhere classified: Secondary | ICD-10-CM

## 2023-09-15 DIAGNOSIS — I5021 Acute systolic (congestive) heart failure: Secondary | ICD-10-CM

## 2023-09-15 LAB — COMPREHENSIVE METABOLIC PANEL
ALT: 19 U/L (ref 0–44)
AST: 31 U/L (ref 15–41)
Albumin: 2.7 g/dL — ABNORMAL LOW (ref 3.5–5.0)
Alkaline Phosphatase: 174 U/L — ABNORMAL HIGH (ref 38–126)
Anion gap: 10 (ref 5–15)
BUN: 23 mg/dL (ref 8–23)
CO2: 28 mmol/L (ref 22–32)
Calcium: 8.4 mg/dL — ABNORMAL LOW (ref 8.9–10.3)
Chloride: 95 mmol/L — ABNORMAL LOW (ref 98–111)
Creatinine, Ser: 2.26 mg/dL — ABNORMAL HIGH (ref 0.61–1.24)
GFR, Estimated: 31 mL/min — ABNORMAL LOW (ref >60)
Glucose, Bld: 132 mg/dL — ABNORMAL HIGH (ref 70–99)
Potassium: 3.2 mmol/L — ABNORMAL LOW (ref 3.5–5.1)
Sodium: 133 mmol/L — ABNORMAL LOW (ref 135–145)
Total Bilirubin: 3.8 mg/dL — ABNORMAL HIGH (ref 0.3–1.2)
Total Protein: 6.4 g/dL — ABNORMAL LOW (ref 6.5–8.1)

## 2023-09-15 LAB — CBC
HCT: 30.2 % — ABNORMAL LOW (ref 39.0–52.0)
Hemoglobin: 10 g/dL — ABNORMAL LOW (ref 13.0–17.0)
MCH: 26.6 pg (ref 26.0–34.0)
MCHC: 33.1 g/dL (ref 30.0–36.0)
MCV: 80.3 fL (ref 80.0–100.0)
Platelets: 156 10*3/uL (ref 150–400)
RBC: 3.76 MIL/uL — ABNORMAL LOW (ref 4.22–5.81)
RDW: 18.7 % — ABNORMAL HIGH (ref 11.5–15.5)
WBC: 4 10*3/uL (ref 4.0–10.5)
nRBC: 0 % (ref 0.0–0.2)

## 2023-09-15 LAB — MAGNESIUM: Magnesium: 1.8 mg/dL (ref 1.7–2.4)

## 2023-09-15 MED ORDER — MAGNESIUM SULFATE 2 GM/50ML IV SOLN
2.0000 g | Freq: Once | INTRAVENOUS | Status: AC
  Administered 2023-09-15: 2 g via INTRAVENOUS
  Filled 2023-09-15: qty 50

## 2023-09-15 MED ORDER — EMPAGLIFLOZIN 10 MG PO TABS
10.0000 mg | ORAL_TABLET | Freq: Every day | ORAL | Status: DC
  Administered 2023-09-15 – 2023-09-22 (×8): 10 mg via ORAL
  Filled 2023-09-15 (×8): qty 1

## 2023-09-15 MED ORDER — POTASSIUM CHLORIDE CRYS ER 20 MEQ PO TBCR
60.0000 meq | EXTENDED_RELEASE_TABLET | ORAL | Status: AC
  Administered 2023-09-15 (×2): 60 meq via ORAL
  Filled 2023-09-15 (×2): qty 3

## 2023-09-15 NOTE — Progress Notes (Signed)
Heart Failure Nurse Navigator Progress Note  PCP: [name], FNP PCP-Cardiologist: Tobb Admission Diagnosis: Congestive heart failure, Pleural effusion, left arm edema.  Admitted from:  Home  Presentation:   Connor Burns. presented with shortness of breath with exertion that has become worse, recently retired, intermittent swelling to his hands.  BP 145/104, BNP 664, CXR and CT with evidence of pulmonary congestion and moderate right and small left pleural effusion.   Patient and wife were educated on the sign and symptoms of heart failure, daily weights, when to call his doctor or go to the ED, Diet/ fluid restrictions, taking all medications as prescribed and attending all medical appointments. Patient and wife verbalized their understanding of education, a HF TOC appointment was scheduled for 10/9/202/4 @ 3 pm.   ECHO/ LVEF: 35-40%  Clinical Course:  Past Medical History:  Diagnosis Date   Acute osteomyelitis of ankle and foot 05/17/2014   Arthritis 10/27/2020   Wrist   Atherosclerosis of native arteries of the extremities with ulceration 06/15/2014   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   veral times a day for 10 minutes each time; The patient was also instructed to hold his Xarelto tonight and tomorrow night.  He is to restart his Xarelto on Sunday night.   CAD (coronary artery disease)    CHF (congestive heart failure) 01/14/2017   Intermittent CHF with lower extremity edema.  I have given him Lasix to take on a p.r.n. basis.Formatting of this note might be different from the original. Last Assessment & Plan: Formatting of this note might be different from the original. Intermittent CHF with lower extremity edema.  I have given him Las   Chronic kidney disease 10/27/2020   Stage II   Congestive cardiomyopathy 01/14/2017   Cardiomyopathy with LV dysfunction.Last ejection fraction 40-45% by echocardiography in 2012.Patient currently not taking  beta-blocker Ace inhibitor for some reason.We will repeat echocardiography to evaluate LV function then determine the need for afterload reduction and beta-blocker therapy.[place]   Coronary atherosclerosis 01/14/2017   Status remote CABG in 2004 in [place].Left internal mammary artery to the obtuse marginal.Free Right internal mammary artery to the LAD.Saphenous vein graft to OM 2.Saphenous vein graft to the RCA.  Asymptomatic without chest pain or shortness of breath.Last nuclear stress test May 2018:  Large   DVT (deep venous thrombosis)    Dyslipidemia 05/07/2018   Hematuria 08/10/2015   Hypertension    ICD (implantable cardioverter-defibrillator) battery depletion 06/16/2018   Status post dual-chamber ICD generator change 06/16/2018.Last Assessment & Plan: Formatting of this note might be different from the original.Left infraclavicular incision intact with Dermabond.  There is no pocket hematoma present.  No drainage noted.Device interrogation this a.m. reveals appropriate device function.   Ischemic cardiomyopathy 06/18/2016   Echo 09/14/2011 showing LA 4.9cm, EF 40-45%.  Mild to moderate decreased LV systolic function.  Mild concentric LVH.  LA is moderately dilated.  Mild aortic cusp sclerosis. Aortic valve appears to be bicuspid.B. Concerning for potential dysrhythmia (ventricular tachycardia) especially given documented nonsustained ventricular tachy   Mild dementia, unclear etiology 12/30/2022   Myocardial infarction    Obesity (BMI 30-39.9) 08/10/2015   Onychomycosis 05/10/2014   Painful legs and moving toes of left foot 05/10/2014   Paroxysmal atrial fibrillation 08/08/2015   A. Paroxysmal.B. On Sotalol therapy.Last Assessment & Plan: History of present atrial fibrillation.  Patient will 80 mg twice daily.Also on rivaroxaban 20 mg a day.Patient again refused an ECG.I explained to the patient, that the absence  of an EKG, I canno   Peripheral vascular disease    Shortness of breath  10/27/2020   Exertional   Stroke 10/27/2020   Right frontal infarct per neuroimaging dating back at least to 2013   Syncope 01/14/2017   In the setting of ischemic cardiomyopathy. B. Concerning for potential dysrhythmia (ventricular tachycardia) especially given documented nonsustained ventricular tachycardia - EPS 9/25/201 showing reproducibly inducible nonsustained monomorphic VT up to 10 beat.  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 In the setting of ischemic cardiomyopathy. B. Concerning for potential dysrhythmia (ventricular tachycardia) especially given documented nonsustained ventricular tachycardia - EPS 9/25/201 showing reproducibly inducible nonsustained monomorphic VT up to 10 beat.  Reproducibly inducible nonsustained polymorphic VT.  Inducible VF wi     Social History   Socioeconomic History   Marital status: Married    Spouse name: Connor Burns   Number of children: 1   Years of education: 25   Highest education level: Master's degree (e.g., MA, MS, MEng, MEd, MSW, MBA)  Occupational History   Occupation: Chaplain    Comment: [employer]    Comment: newly retired  Tobacco Use   Smoking status: Never   Smokeless tobacco: Never  Vaping Use   Vaping status: Never Used  Substance and Sexual Activity   Alcohol use: No   Drug use: No   Sexual activity: Not on file  Other Topics Concern   Not on file  Social History Narrative   Lives with wife in a 2 story home.  Has 3 children.     Works as a Metallurgist.     Education: Masters in [employer].   Right handed   Caffeine yes   Social Determinants of Health   Financial Resource Strain: Medium Risk (09/15/2023)   Overall Financial Resource Strain (CARDIA)    Difficulty of Paying Living Expenses: Somewhat hard  Food Insecurity: No Food Insecurity (09/15/2023)   Hunger Vital Sign     Worried About Running Out of Food in the Last Year: Never true    Ran Out of Food in the Last Year: Never true  Transportation Needs: No Transportation Needs (09/12/2023)   PRAPARE - Administrator, Civil Service (Medical): No    Lack of Transportation (Non-Medical): No  Physical Activity: Not on file  Stress: Not on file  Social Connections: Not on file   Education Assessment and Provision:  Detailed education and instructions provided on heart failure disease management including the following:  Signs and symptoms of Heart Failure When to call the physician Importance of daily weights Low sodium diet Fluid restriction Medication management Anticipated future follow-up appointments  Patient education given on each of the above topics.  Patient acknowledges understanding via teach back method and acceptance of all instructions.  Education Materials:  "Living Better With Heart Failure" Booklet, HF zone tool, & Daily Weight Tracker Tool.  Patient has scale at home: Yes Patient has pill box at home: NA     High Risk Criteria for Readmission and/or Poor Patient Outcomes: Heart failure hospital admissions (last 6 months): 1  No Show rate: 1% Difficult social situation: No, lives with wife and daughter (special needs)  Demonstrates medication adherence: yes Primary Language: English Literacy level: Reading, writing, and comprehension  Barriers of Care:   Diet/ fluid restrictions Daily weights  Considerations/Referrals:   Referral made to  Heart Failure Pharmacist Stewardship: Yes Referral made to Heart Failure CSW/NCM TOC: No Referral made to Heart & Vascular TOC clinic: Yes, 10/01/2023 @ 3 pm.   Items for Follow-up on DC/TOC: Continued HF education Diet/ fluid restrictions/ daily weights   Rhae Hammock, BSN, RN Heart Failure Teacher, adult education Only

## 2023-09-15 NOTE — Progress Notes (Signed)
Patient Name: Connor Burns. Date of Encounter: 09/15/2023 [facility] Cardiologist: Thomasene Ripple, DO   Interval Summary  .    Continues to improve and diuresing well.  No more shortness of breath at rest.  Ambulates and does decently well.  Vital Signs .    Vitals:   09/14/23 1720 09/14/23 2042 09/15/23 0300 09/15/23 0737  BP: 110/64 126/75 127/76 109/64  Pulse:  82 84   Resp:   18   Temp:  98.3 F (36.8 C) 98.2 F (36.8 C)   TempSrc:  Oral Oral   SpO2:  95% 96%   Weight:      Height:        Intake/Output Summary (Last 24 hours) at 09/15/2023 0810 Last data filed at 09/14/2023 1729 Gross per 24 hour  Intake 240 ml  Output 2325 ml  Net -2085 ml      09/14/2023    3:58 AM 09/13/2023    5:22 AM 09/12/2023    5:47 PM  Last 3 Weights  Weight (lbs) 276 lb 14.4 oz 288 lb 5.8 oz 289 lb 9.6 oz  Weight (kg) 125.601 kg 130.8 kg 131.362 kg      Telemetry/ECG    Atrial fibrillation heart rates in the 80s.  PVCs.- Personally Reviewed  CV Studies    Echocardiogram 09/13/2023  1. Left ventricular ejection fraction, by estimation, is 35 to 40%. The  left ventricle has moderately decreased function. The left ventricle  demonstrates regional wall motion abnormalities (see scoring  diagram/findings for description). There is mild  left ventricular hypertrophy. Left ventricular diastolic parameters are  indeterminate.   2. No formed LV mural thrombus noted with Definity contrast.   3. Right ventricular systolic function is mildly reduced. The right  ventricular size is not well visualized. There is mildly elevated  pulmonary artery systolic pressure. The estimated right ventricular  systolic pressure is 40.2 mmHg.   4. A small pericardial effusion is present. The pericardial effusion is  posterior to the left ventricle.   5. The mitral valve is degenerative. Mild mitral valve regurgitation.   6. The aortic valve is tricuspid. There is mild calcification of the   aortic valve. Aortic valve regurgitation is not visualized. Aortic valve  sclerosis is present, with no evidence of aortic valve stenosis.   7. Aortic dilatation noted. There is borderline dilatation of the  ascending aorta, measuring 37 mm.   8. The inferior vena cava is dilated in size with <50% respiratory  variability, suggesting right atrial pressure of 15 mmHg.   Comparison(s): Prior images unable to be directly viewed.   Physical Exam .   GEN: No acute distress.   Neck: + JVD Cardiac: Irregularly irregular Respiratory: Slight crackles in the bases GI: Soft, nontender, non-distended  MS: 1-2+ edema  Patient Profile    Connor Burns. is a 67 y.o. male has hx of  CAD status post CABG 2004 in [place], chronic HFrEF/ischemic cardiomyopathy status post ICD, longstanding persistent atrial fibrillation, hypertension, hyperlipidemia, CKD, DVT, CVA, morbid obesity and admitted on 09/12/2019 for for the evaluation of heart failure exacerbation  Assessment & Plan .     Acute on chronic HFrEF status post ICD Moderate right-sided pleural effusion Ischemic cardiomyopathy EF 30 to 35% based off limited echocardiogram in March 2024.  Echo here shows 35 to 40%.  Still clearly has signs of hypervolemia with 1-2+ pitting edema, JVD, crackles.  He is diuresing well with stable renal function.  -3  L in the last 24 hours.  GDMT limited by his renal disease. Continue carvedilol 3.125 mg twice daily, we have added hydralazine 25 mg and Imdur 15 (discontinued sildenafil at discharge) Continue IV Lasix 80 mg twice daily PTA was on Lasix 40 mg twice daily, Metolazone once a week. (Potassium has been supplemented) Weight on admission to 289.6.  Weight yesterday 276.9  ICD Medtronic dual-chamber ICD.  RV lead with elevated capture threshold, does not require pacing.  There were inappropriate pacing spikes at times on the telemetry, under sensing of atrial fibrillation.  Dr. Hyacinth Meeker adjusted the  settings.   CAD status post CABG 2004 Has previous abnormal nuclear stress test in with deferment to pursue left heart catheterization due to renal dysfunction.  No anginal symptoms here. Continue aspirin, beta-blocker, statin   Acute respiratory failure with hypoxia Initially was on 2 L via nasal cannula.  Off supplemental oxygen.  Longstanding persistent atrial fibrillation Rate controlled with heart rates in the 80s.   Continue Xarelto, has not been taking this consistently.  On beta-blocker.   CKD Creatinine on admission 2.18.  Currently 2.26.  Difficult to determine baseline however seems to be around 2.0-2.4. For questions or updates, please contact [facility] Please consult [email or web address] for contact info under        Signed, Abagail Kitchens, PA-C

## 2023-09-15 NOTE — TOC Initial Note (Signed)
Transition of Care (TOC) - Initial/Assessment Note    Patient Details  Name: Connor Burns. MRN: [id number] Date of Birth: May 19, 1956  Transition of Care [facility]) CM/SW Contact:    Gala Lewandowsky, RN Phone Number: 09/15/2023, 4:40 PM  Clinical Narrative:  Patient presented for shortness of breath. PTA patient was from home with spouse. Patient has had home health in the past and spouse wants to use [facility] for services. Referral made for RN and PT-will need orders as the patient progresses. Case Manager will continue to follow for additional transition of care needs.                  Expected Discharge Plan: Home w Home Health Services Barriers to Discharge: Continued Medical Work up   Patient Goals and CMS Choice Patient states their goals for this hospitalization and ongoing recovery are:: to return home with home health   Choice offered to / list presented to : Spouse, Patient      Expected Discharge Plan and Services In-house Referral: NA Discharge Planning Services: CM Consult Post Acute Care Choice: Home Health Living arrangements for the past 2 months: Single Family Home                           HH Arranged: RN, PT HH Agency: [facility] Date [facility] Agency Contacted: 09/15/23 Time HH Agency Contacted: [phone] Representative spoke with at [facility] Agency: Haywood Lasso  Prior Living Arrangements/Services Living arrangements for the past 2 months: Single Family Home Lives with:: Spouse Patient language and need for interpreter reviewed:: Yes Do you feel safe going back to the place where you live?: Yes      Need for Family Participation in Patient Care: Yes (Comment) Care giver support system in place?: Yes (comment)   Criminal Activity/Legal Involvement Pertinent to Current Situation/Hospitalization: No - Comment as needed  Activities of Daily Living Home Assistive Devices/Equipment: None ADL Screening (condition at time of admission) Patient's  cognitive ability adequate to safely complete daily activities?: Yes Is the patient deaf or have difficulty hearing?: No Does the patient have difficulty seeing, even when wearing glasses/contacts?: No Does the patient have difficulty concentrating, remembering, or making decisions?: Yes Patient able to express need for assistance with ADLs?: Yes Does the patient have difficulty dressing or bathing?: No Independently performs ADLs?: Yes (appropriate for developmental age) Does the patient have difficulty walking or climbing stairs?: Yes Weakness of Legs: None Weakness of Arms/Hands: None  Permission Sought/Granted Permission sought to share information with : Case Manager       Permission granted to share info w AGENCY: [facility]        Emotional Assessment Appearance:: Appears stated age Attitude/Demeanor/Rapport: Engaged Affect (typically observed): Appropriate Orientation: : Oriented to Self, Oriented to Place, Oriented to  Time, Oriented to Situation Alcohol / Substance Use: Not Applicable Psych Involvement: No (comment)  Admission diagnosis:  Pleural effusion [J90] Hypoxia [R09.02] Left arm swelling [M79.89] Congestive heart failure, unspecified HF chronicity, unspecified heart failure type [facility]) [I50.9] Patient Active Problem List   Diagnosis Date Noted   Class 2 obesity due to excess calories with body mass index (BMI) of 37.0 to 37.9 in adult 09/12/2023   Left arm swelling 09/12/2023   Hypoxia 09/12/2023   Acute respiratory failure with hypoxia (HCC) 09/12/2023   Acute systolic heart failure (HCC) 09/12/2023   Stage 3b chronic kidney disease (CKD) (HCC) 09/12/2023  Atrial fibrillation, permanent (HCC) 04/05/2023   Depressed left ventricular ejection fraction 04/05/2023   Medication management 04/05/2023   Generalized weakness 03/13/2023   Mild dementia, unclear etiology 12/30/2022   Chronic kidney disease 10/27/2020   Myocardial infarction 10/27/2020    Shortness of breath 10/27/2020   Arthritis 10/27/2020   Stroke    Dyslipidemia 05/07/2018   Hypertension 05/07/2018   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   CHF (congestive heart failure) 01/14/2017   Congestive cardiomyopathy 01/14/2017   Coronary atherosclerosis 01/14/2017   Syncope 01/14/2017   Ischemic cardiomyopathy 06/18/2016   Hematuria 08/10/2015   Obesity (BMI 30-39.9) 08/10/2015   Painful legs and moving toes of left foot 05/10/2014   Onychomycosis 05/10/2014   PCP:  [name], FNP Pharmacy:   [facility] [address]  [place] [address] [facility] [place] [address] Phone: [phone] Fax: [phone]  [facility] [address] [place] [address] Phone: [phone] Fax: [phone]     Social Determinants of Health (SDOH) Social History: SDOH Screenings   Food Insecurity: No Food Insecurity (09/15/2023)  Housing: Low Risk  (09/15/2023)  Transportation Needs: No Transportation Needs (09/12/2023)  Utilities: Not At Risk (09/12/2023)  Alcohol Screen: Low Risk  (09/15/2023)  Depression (PHQ2-9): Low Risk  (03/03/2023)  Financial Resource Strain: Medium Risk (09/15/2023)  Tobacco Use: Low Risk  (09/12/2023)   SDOH Interventions: Food Insecurity Interventions: Intervention Not Indicated Housing Interventions: Intervention Not Indicated Alcohol Usage Interventions: Intervention Not Indicated (Score <7) Financial Strain Interventions: Intervention Not Indicated   Readmission Risk Interventions     No data to display

## 2023-09-15 NOTE — Plan of Care (Signed)
Nutrition Education Note  RD consulted for nutrition education regarding CHF.  RD provided "Heart Failure Nutrition Therapy" handout from the Academy of Nutrition and Dietetics. Reviewed patient's dietary recall. Patient endorses typically only eating 1 meal a day, which is usually a meal prepared by his wife for dinner. Patient shares this is usually a very healthy meal as his wife is health conscious.  Provided examples on ways to decrease sodium intake in diet. Discouraged intake of processed foods and use of salt shaker. Encouraged fresh fruits and vegetables as well as whole grain sources of carbohydrates to maximize fiber intake. Also discussed fluid restriction.  RD discussed why it is important for patient to adhere to diet recommendations, and emphasized the role of fluids, foods to avoid, and importance of weighing self daily. Teach back method used. Wife presented towards end of education. She reports she will help keep patient on track towards his goals. She plans to start looking at nutrition labels more for sodium content.  Expect good compliance.  Body mass index is 35.55 kg/m. Pt meets criteria for Obesity II based on current BMI.  Current diet order is 2 gram sodium, patient is consuming approximately 25-100% of meals at this time. Labs and medications reviewed. No further nutrition interventions warranted at this time. RD contact information provided. If additional nutrition issues arise, please re-consult RD.   Shelle Iron RD, LDN For contact information, refer to [facility].

## 2023-09-15 NOTE — Care Management Important Message (Signed)
Important Message  Patient Details  Name: Connor Burns. MRN: [id number] Date of Birth: January 14, 1956   Medicare Important Message Given:  Yes     Sherilyn Banker 09/15/2023, 2:42 PM

## 2023-09-15 NOTE — Progress Notes (Signed)
   Heart Failure Stewardship Pharmacist Progress Note   PCP: [name], FNP PCP-Cardiologist: Thomasene Ripple, DO    HPI:  67 yo M with PMH of CAD s/p CABG in 2004, CHF, afib, HTN, HLD, CKD, DVT, CVA, and morbid obesity.   Presented to the ED on 9/20 with shortness of breath and L hand swelling. CXR and CT show evidence of pulmonary congestion and moderate R and small L sided pleural effusion. BNP 664. ECHO 9/21 showed LVEF 35-40% (was 30-35% in 02/2023), RWMA, mild LVH, mildly elevated PA pressure, small pericardial effusion, mild MR.   Current HF Medications: Diuretic: furosemide 80 mg IV BID Beta Blocker: carvedilol 3.125 mg BID SGLT2i: Jardiance 10 mg daily Other: hydralazine 25 mg TID + Imdur 15 mg daily  Prior to admission HF Medications: Diuretic: furosemide 40 mg BID MWF and 40 mg daily all other days + metolazone 2.5 mg once weekly Beta blocker: carvedilol 3.125 mg BID  Pertinent Lab Values: Serum creatinine 2.26, BUN 23, Potassium 3.2, Sodium 133, BNP 664.8  Vital Signs: Weight: 276 lbs (admission weight: 289 lbs) Blood pressure: 110-120/70s  Heart rate: 70-80s  I/O: net -3.2L yesterday; net -9.7L since admission  Medication Assistance / Insurance Benefits Check: Does the patient have prescription insurance?  No Type of insurance plan: only medicare A/B  Does the patient qualify for medication assistance through manufacturers or grants?   Yes Eligible grants and/or patient assistance programs: Jardiance, Xarelto Medication assistance applications in progress: Jardiance, Xarelto Medication assistance applications approved: none Approved medication assistance renewals will be completed by: pending  Outpatient Pharmacy:  Prior to admission outpatient pharmacy: [facility] Is the patient willing to use [facility] TOC pharmacy at discharge? Yes Is the patient willing to transition their outpatient pharmacy to utilize a [facility] outpatient pharmacy?   Pending     Assessment: 1. Acute on chronic systolic CHF (LVEF 35-40%), due to ICM. NYHA class III symptoms. - Continue furosemide 80 mg IV BID. Still volume overloaded on exam. Strict I/Os and daily weights. Keep K>4 and Mg>2. Check magnesium in AM. - Continue carvedilol 3.125 mg BID - No ACE/ARB/ARNI or MRA with CKD - Agree with adding Jardiance 10 mg daily - Continue hydralazine 25 mg TID + Imdur 15 mg daily   Plan: 1) Medication changes recommended at this time: - Agree with changes - Check magnesium in AM  2) Patient assistance: - No prescription insurance found - starting Jardiance and Xarelto patient assistance   3)  Education  - Patient has been educated on current HF medications and potential additions to HF medication regimen - Patient verbalizes understanding that over the next few months, these medication doses may change and more medications may be added to optimize HF regimen - Patient has been educated on basic disease state pathophysiology and goals of therapy   Sharen Hones, PharmD, BCPS Heart Failure Engineer, building services Phone [phone]

## 2023-09-15 NOTE — Progress Notes (Signed)
IV Magnesium was half way done when pt complained of his right arm hurting and requesting IV to be removed. IV flushed with no pain but pt would not allow magnesium to be restarted. After giving the patient over an hour off of the drip, RN attempted to restart mag with NS to dilute but pt continued to refuse. RN attempted to give xarelto and tylenol but pt stated he "has never had a clot." RN explained xarelto and clots to which pt understands he's on xarelto but currently refuses to take both xarelto and tylenol. Pt currently refusing IV lasix as well, stating "it's not working."  RN will attempt to administer medicine later and inform night shift RN as well.

## 2023-09-15 NOTE — Progress Notes (Signed)
Progress Note   Patient: Connor Burns. XBJ:[id number] DOB: 08/24/1956 DOA: 09/12/2023     3 DOS: the patient was seen and examined on 09/15/2023   Brief hospital course:  67 y.o. male with medical history significant for chronic systolic CHF with last known LVEF of 30 to 35% from a 2D echocardiogram which was done 03/24, ischemic cardiomyopathy status post AICD placement, dyslipidemia, longstanding persistent A-fib, obesity with a BMI of 37, history of coronary artery disease, history of CVA, history of stage IIIb chronic kidney disease who presents to the emergency room for evaluation of progressively worsening shortness of breath over the last several months.  Shortness of breath is mostly with exertion and per patient's family he is only able to walk very short distances and has to sit down and rest.  He has bilateral lower extremity swelling and also complains of left upper extremity swelling.  According to his wife he has been compliant with his diuretic therapy. He denies having any chest pain, no nausea, no vomiting, no palpitations, no diaphoresis, no headache, no dizziness, no lightheadedness, no abdominal pain, no changes in his bowel habits, no blurred vision, no focal deficit. In the ER patient was noted to be hypoxic with room air pulse oximetry of 86% at rest and was placed on 2 L of oxygen with improvement in his pulse oximetry to 95%. Abnormal labs include BNP 664, creatinine 2.18, troponin 39 >> 37 Left upper extremity venous Doppler shows no evidence of DVT within the left upper extremity. Chest x-ray reviewed by me shows suspected pulmonary edema with increase in size of moderate right and small left-sided pleural effusions with associated bibasilar heterogeneous/consolidative opacities, atelectasis versus infection. CT scan of the chest without contrast shows moderate right and small left pleural effusions. Diffuse body wall edema, likely related to patient volume status. No evidence  of obstructing mass. No definite lymphadenopathy seen in the chest, although soft tissue anasarca limits evaluation. Aortic Atherosclerosis. Twelve-lead EKG reviewed by me shows A-fib with an old inferior infarct Patient received a dose of IV Lasix 40 mg and will be admitted to the hospital for further evaluation.    Assessment and Plan: Acute respiratory failure with hypoxia (HCC) -Likely related to pulm edema and pleural effusion from acute CHF exacerbation per below -Cont IV lasix as tolerated -wean O2 as tolerated -Will need to assess patient for home oxygen need prior to discharge   Acute on chronic systolic CHF (congestive heart failure) (HCC) -Patient presents for evaluation of worsening shortness of breath with exertion and generalized body swelling -BNP is elevated and chest x-ray shows bilateral pleural effusions (Rt > Lt) -Continue carvedilol -Currently not on an ACE inhibitor or ARB due to renal insufficiency -Cardiology following -Continue IV lasix -Follow I/o and daily wts -Jardiance added per Cardiology   Ischemic cardiomyopathy Patient has nonischemic cardiomyopathy with last known LVEF of 30 to 35% status post AICD placement Continue aspirin, carvedilol and rosuvastatin -2d echo EF 35-40%   Atrial fibrillation, permanent (HCC) Continue carvedilol for rate control Continue Xarelto for secondary prophylaxis for an acute stroke   Stage 3b chronic kidney disease (CKD) (HCC) Renal function overall stable Monitor closely while on diuretics   Class 2 obesity due to excess calories with body mass index (BMI) of 37.0 to 37.9 in adult Complicates overall prognosis and care Lifestyle modification and exercise has been discussed with patient in detail   Mild dementia, unclear etiology 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 Without complaints  Physical Exam: Vitals:   09/14/23 1720 09/14/23 2042 09/15/23 0300 09/15/23 0737  BP: 110/64 126/75 127/76 109/64  Pulse:  82 84   Resp:   18   Temp:  98.3 F (36.8 C) 98.2 F (36.8 C)   TempSrc:  Oral Oral   SpO2:  95% 96%   Weight:      Height:       General exam: Awake, laying in bed, in nad Respiratory system: Normal respiratory effort, no wheezing Cardiovascular system: regular rate, s1, s2 Gastrointestinal system: Soft, nondistended, positive BS Central nervous system: CN2-12 grossly intact, strength intact Extremities: Perfused, no clubbing Skin: Normal skin turgor, no notable skin lesions seen Psychiatry: Mood normal // no visual hallucinations   Data Reviewed:  Labs reviewed: Na 133, K 3.2, Cr 2.26, WBC 4.0, Hgb 10.0  Family Communication: Pt in room, family at bedside  Disposition: Status is: Inpatient Remains inpatient appropriate because: severity of illness  Planned Discharge Destination: Home     Author: Rickey Barbara, MD 09/15/2023 3:14 PM  For on call review [email or web address].

## 2023-09-16 DIAGNOSIS — I4821 Permanent atrial fibrillation: Secondary | ICD-10-CM

## 2023-09-16 DIAGNOSIS — M7989 Other specified soft tissue disorders: Secondary | ICD-10-CM | POA: Diagnosis not present

## 2023-09-16 DIAGNOSIS — I5021 Acute systolic (congestive) heart failure: Secondary | ICD-10-CM | POA: Diagnosis not present

## 2023-09-16 DIAGNOSIS — I509 Heart failure, unspecified: Secondary | ICD-10-CM | POA: Diagnosis not present

## 2023-09-16 DIAGNOSIS — J9601 Acute respiratory failure with hypoxia: Secondary | ICD-10-CM | POA: Diagnosis not present

## 2023-09-16 LAB — COMPREHENSIVE METABOLIC PANEL
ALT: 23 U/L (ref 0–44)
AST: 35 U/L (ref 15–41)
Albumin: 3 g/dL — ABNORMAL LOW (ref 3.5–5.0)
Alkaline Phosphatase: 190 U/L — ABNORMAL HIGH (ref 38–126)
Anion gap: 15 (ref 5–15)
BUN: 24 mg/dL — ABNORMAL HIGH (ref 8–23)
CO2: 25 mmol/L (ref 22–32)
Calcium: 8.7 mg/dL — ABNORMAL LOW (ref 8.9–10.3)
Chloride: 93 mmol/L — ABNORMAL LOW (ref 98–111)
Creatinine, Ser: 2.28 mg/dL — ABNORMAL HIGH (ref 0.61–1.24)
GFR, Estimated: 31 mL/min — ABNORMAL LOW (ref >60)
Glucose, Bld: 97 mg/dL (ref 70–99)
Potassium: 3.6 mmol/L (ref 3.5–5.1)
Sodium: 133 mmol/L — ABNORMAL LOW (ref 135–145)
Total Bilirubin: 4 mg/dL — ABNORMAL HIGH (ref 0.3–1.2)
Total Protein: 7.2 g/dL (ref 6.5–8.1)

## 2023-09-16 LAB — CBC
HCT: 31.9 % — ABNORMAL LOW (ref 39.0–52.0)
Hemoglobin: 10.7 g/dL — ABNORMAL LOW (ref 13.0–17.0)
MCH: 26.8 pg (ref 26.0–34.0)
MCHC: 33.5 g/dL (ref 30.0–36.0)
MCV: 79.9 fL — ABNORMAL LOW (ref 80.0–100.0)
Platelets: 159 10*3/uL (ref 150–400)
RBC: 3.99 MIL/uL — ABNORMAL LOW (ref 4.22–5.81)
RDW: 18.4 % — ABNORMAL HIGH (ref 11.5–15.5)
WBC: 4 10*3/uL (ref 4.0–10.5)
nRBC: 0 % (ref 0.0–0.2)

## 2023-09-16 LAB — MAGNESIUM: Magnesium: 1.9 mg/dL (ref 1.7–2.4)

## 2023-09-16 MED ORDER — POTASSIUM CHLORIDE CRYS ER 20 MEQ PO TBCR
40.0000 meq | EXTENDED_RELEASE_TABLET | Freq: Two times a day (BID) | ORAL | Status: DC
  Administered 2023-09-16 (×2): 40 meq via ORAL
  Filled 2023-09-16 (×3): qty 2

## 2023-09-16 MED ORDER — METOLAZONE 5 MG PO TABS
5.0000 mg | ORAL_TABLET | Freq: Once | ORAL | Status: AC
  Administered 2023-09-16: 5 mg via ORAL
  Filled 2023-09-16: qty 1

## 2023-09-16 MED ORDER — MELATONIN 5 MG PO TABS
5.0000 mg | ORAL_TABLET | Freq: Every evening | ORAL | Status: DC | PRN
  Administered 2023-09-16 – 2023-09-21 (×6): 5 mg via ORAL
  Filled 2023-09-16 (×7): qty 1

## 2023-09-16 MED ORDER — FUROSEMIDE 10 MG/ML IJ SOLN
120.0000 mg | Freq: Two times a day (BID) | INTRAVENOUS | Status: DC
  Administered 2023-09-16 – 2023-09-17 (×4): 120 mg via INTRAVENOUS
  Filled 2023-09-16 (×2): qty 12
  Filled 2023-09-16 (×4): qty 10

## 2023-09-16 NOTE — Progress Notes (Addendum)
Patient Name: Connor Burns. Date of Encounter: 09/16/2023 [facility] Cardiologist: Thomasene Ripple, DO   Interval Summary  .    Continues to improve and diuresing well although output has not been accurately documented.  Shortness of breath seems to be at baseline.  Patient resting comfortably and sleeping in bed.  Vital Signs .    Vitals:   09/15/23 0737 09/15/23 1550 09/15/23 1900 09/16/23 0425  BP: 109/64 119/68 (!) 143/77 (!) 109/57  Pulse:  79 77 68  Resp:  18 18 18   Temp:  98 F (36.7 C) 97.9 F (36.6 C) 97.9 F (36.6 C)  TempSrc:  Oral Oral Oral  SpO2:   94% 94%  Weight:    123.7 kg  Height:        Intake/Output Summary (Last 24 hours) at 09/16/2023 0805 Last data filed at 09/16/2023 0200 Gross per 24 hour  Intake 26.49 ml  Output 600 ml  Net -573.51 ml      09/16/2023    4:25 AM 09/14/2023    3:58 AM 09/13/2023    5:22 AM  Last 3 Weights  Weight (lbs) 272 lb 11.3 oz 276 lb 14.4 oz 288 lb 5.8 oz  Weight (kg) 123.7 kg 125.601 kg 130.8 kg      Telemetry/ECG    Atrial fibrillation heart rates in the 80s.  PVCs.- Personally Reviewed  CV Studies    Echocardiogram 09/13/2023  1. Left ventricular ejection fraction, by estimation, is 35 to 40%. The  left ventricle has moderately decreased function. The left ventricle  demonstrates regional wall motion abnormalities (see scoring  diagram/findings for description). There is mild  left ventricular hypertrophy. Left ventricular diastolic parameters are  indeterminate.   2. No formed LV mural thrombus noted with Definity contrast.   3. Right ventricular systolic function is mildly reduced. The right  ventricular size is not well visualized. There is mildly elevated  pulmonary artery systolic pressure. The estimated right ventricular  systolic pressure is 40.2 mmHg.   4. A small pericardial effusion is present. The pericardial effusion is  posterior to the left ventricle.   5. The mitral valve is  degenerative. Mild mitral valve regurgitation.   6. The aortic valve is tricuspid. There is mild calcification of the  aortic valve. Aortic valve regurgitation is not visualized. Aortic valve  sclerosis is present, with no evidence of aortic valve stenosis.   7. Aortic dilatation noted. There is borderline dilatation of the  ascending aorta, measuring 37 mm.   8. The inferior vena cava is dilated in size with <50% respiratory  variability, suggesting right atrial pressure of 15 mmHg.   Comparison(s): Prior images unable to be directly viewed.   Physical Exam .   GEN: No acute distress.   Neck: + JVD Cardiac: Irregularly irregular Respiratory: Generally sounds clear, not able to listen to his back due to sleeping. GI: Soft, nontender, non-distended  MS: 2+ edema, asymmetric left-sided hand edema.  Improving.  Patient Profile    Connor Burns. is a 67 y.o. male has hx of  CAD status post CABG 2004 in [place], chronic HFrEF/ischemic cardiomyopathy status post ICD, longstanding persistent atrial fibrillation, hypertension, hyperlipidemia, CKD, DVT, CVA, morbid obesity and admitted on 09/12/2019 for for the evaluation of heart failure exacerbation  Assessment & Plan .     Acute on chronic HFrEF status post ICD Moderate right-sided pleural effusion Ischemic cardiomyopathy EF 30 to 35% based off limited echocardiogram in March 2024.  Echo here shows 35 to 40%.    Still clearly has signs of hypervolemia with 1-2+ pitting edema, JVD,  He is diuresing well with stable renal function.  I's and O's are not documented accurately today due to issues with pure wick and using the urinal.  However based off weight he is diuresing well. Continue carvedilol 3.125 mg twice daily, we have added hydralazine 25 mg and Imdur 15 (discontinued sildenafil at discharge).  We have added Jardiance 10 mg daily. Continue IV Lasix 80 mg twice daily PTA was on Lasix 40 mg twice daily, Metolazone once a  week. (Potassium/magnesium has been supplemented).  Weight on admission to 289.6.  Weight 276.9>272.7 GDMT limited by his renal disease.  ICD Medtronic dual-chamber ICD.  RV lead with elevated capture threshold, does not require pacing.  There were inappropriate pacing spikes at times on the telemetry, under sensing of atrial fibrillation.  Dr. Hyacinth Meeker adjusted the settings.   CAD status post CABG 2004 Has previous abnormal nuclear stress test in with deferment to pursue left heart catheterization due to renal dysfunction.  No anginal symptoms here. Continue aspirin, beta-blocker, statin   Acute respiratory failure with hypoxia Initially was on 2 L via nasal cannula.  Off supplemental oxygen.  Longstanding persistent atrial fibrillation Rate controlled with heart rates in the 80s.   Continue Xarelto, has not been taking this consistently.  On beta-blocker.   CKD Creatinine on admission 2.18.  Currently 2.26.  Difficult to determine baseline however seems to be around 2.0-2.4.  For questions or updates, please contact [facility] Please consult [email or web address] for contact info under        Signed, Abagail Kitchens, PA-C    Attending Attestation:  Patient seen and examined, note reviewed with the signed Resident Physician/Advanced Practice Provider. I personally reviewed laboratory data, imaging studies and relevant notes. I independently examined the patient and formulated the important aspects of the plan. I have personally discussed the plan with the patient and/or family. Comments or changes to the note/plan are indicated below.  My Exam:  General: Well nourished, well developed, in no acute distress Head: Atraumatic, normal size  Eyes: PEERLA, EOMI  Neck: Supple, JVD 15-17 cmH2O Endocrine: No thryomegaly Cardiac: Normal S1, S2; irregular rhythm Lungs: Clear to auscultation bilaterally, no wheezing, rhonchi or rales  Abd: Soft, nontender, no hepatomegaly  Ext: 2+  pitting edema up to knees Musculoskeletal: No deformities, BUE and BLE strength normal and equal Skin: Warm and dry, no rashes   Neuro: Alert and oriented to person, place, time, and situation, CNII-XII grossly intact, no focal deficits  Psych: Normal mood and affect   Telemetry: A-fib heart rate 80s Echo: LVEF 35 to 40% with regional wall motion abnormalities.  RVSP 40 mmHg, plethoric IVC Cath: None  Assessment & Plan: 67 year old male with history of CAD status post CABG, systolic heart failure/ischemic cardiomyopathy, CKD stage IIIb, obesity, CVA who was admitted on 09/12/2023 for acute on chronic systolic heart failure.   # Acute on chronic systolic heart failure, EF 35 to 40% # Ischemic cardiomyopathy status post ICD -Remains volume overloaded.  Suboptimal diuresis.  Increase Lasix to 120 mg IV twice daily.  Give metolazone 5 mg today as well. -Continue carvedilol 3.125 mg twice daily, Jardiance 10 mg daily, hydralazine 25 mg 3 times daily, isosorbide mononitrate 50 mg daily -Given CKD stage IIIb would be cautious with ACE/ARB/ARNI/MRA.  # Longstanding persistent atrial fibrillation -Continue beta-blocker for now.  Rates are controlled.  Would  benefit from restoration of sinus rhythm.  Can discuss with outpatient cardiologist.  # CKD stage IIIb -Stable.  Signed, Lenna Gilford. Flora Lipps, MD [facility]  [facility]  09/16/2023 9:47 AM

## 2023-09-16 NOTE — Progress Notes (Signed)
Progress Note   Patient: Connor Burns. ZOX:[id number] DOB: 08-30-1956 DOA: 09/12/2023     4 DOS: the patient was seen and examined on 09/16/2023   Brief hospital course:  67 y.o. male with medical history significant for chronic systolic CHF with last known LVEF of 30 to 35% from a 2D echocardiogram which was done 03/24, ischemic cardiomyopathy status post AICD placement, dyslipidemia, longstanding persistent A-fib, obesity with a BMI of 37, history of coronary artery disease, history of CVA, history of stage IIIb chronic kidney disease who presents to the emergency room for evaluation of progressively worsening shortness of breath over the last several months.  Shortness of breath is mostly with exertion and per patient's family he is only able to walk very short distances and has to sit down and rest.  He has bilateral lower extremity swelling and also complains of left upper extremity swelling.  According to his wife he has been compliant with his diuretic therapy. He denies having any chest pain, no nausea, no vomiting, no palpitations, no diaphoresis, no headache, no dizziness, no lightheadedness, no abdominal pain, no changes in his bowel habits, no blurred vision, no focal deficit. In the ER patient was noted to be hypoxic with room air pulse oximetry of 86% at rest and was placed on 2 L of oxygen with improvement in his pulse oximetry to 95%. Abnormal labs include BNP 664, creatinine 2.18, troponin 39 >> 37 Left upper extremity venous Doppler shows no evidence of DVT within the left upper extremity. Chest x-ray reviewed by me shows suspected pulmonary edema with increase in size of moderate right and small left-sided pleural effusions with associated bibasilar heterogeneous/consolidative opacities, atelectasis versus infection. CT scan of the chest without contrast shows moderate right and small left pleural effusions. Diffuse body wall edema, likely related to patient volume status. No evidence  of obstructing mass. No definite lymphadenopathy seen in the chest, although soft tissue anasarca limits evaluation. Aortic Atherosclerosis. Twelve-lead EKG reviewed by me shows A-fib with an old inferior infarct Patient received a dose of IV Lasix 40 mg and will be admitted to the hospital for further evaluation.    Assessment and Plan: Acute respiratory failure with hypoxia (HCC) -Likely related to pulm edema and pleural effusion from acute CHF exacerbation per below -Cont IV lasix as tolerated, dose was increased to 120mg  bid per Cardiology -now on RA -Will need to assess patient for home oxygen need prior to discharge   Acute on chronic systolic CHF (congestive heart failure) (HCC) -Patient presents for evaluation of worsening shortness of breath with exertion and generalized body swelling -BNP is elevated and chest x-ray shows bilateral pleural effusions (Rt > Lt) -Continue carvedilol -Currently not on an ACE inhibitor or ARB due to renal insufficiency -Cardiology following. IV increased to 120mg  bid -Follow I/o and daily wts. Wt is down 17.5lbs today since admit -Jardiance added per Cardiology   Ischemic cardiomyopathy Patient has nonischemic cardiomyopathy with previous LVEF of 30 to 35% status post AICD placement Continue aspirin, carvedilol and rosuvastatin -Repeat 2d echo EF 35-40%   Atrial fibrillation, permanent (HCC) Continue carvedilol for rate control Continue Xarelto for secondary prophylaxis for an acute stroke   Stage 3b chronic kidney disease (CKD) (HCC) Renal function overall stable Monitor closely while on diuretics   Class 2 obesity due to excess calories with body mass index (BMI) of 37.0 to 37.9 in adult Complicates overall prognosis and care Lifestyle modification and exercise has been discussed with patient in detail  Mild dementia, unclear etiology 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 Without complaints  Physical Exam: Vitals:   09/16/23 0800 09/16/23 1016 09/16/23 1111 09/16/23 1407  BP: 120/68 120/68 120/80 104/81  Pulse: 69 69 72   Resp: 16  18   Temp: 98.3 F (36.8 C)  97.9 F (36.6 C)   TempSrc: Axillary  Oral   SpO2: 97%  100%   Weight:      Height:       General exam: Conversant, in no acute distress Respiratory system: normal chest rise, clear, no audible wheezing Cardiovascular system: regular rhythm, s1-s2 Gastrointestinal system: Nondistended, nontender, pos BS Central nervous system: No seizures, no tremors Extremities: No cyanosis, no joint deformities, BLE edema Skin: No rashes, no pallor Psychiatry: Affect normal // no auditory hallucinations   Data Reviewed:  Labs reviewed: Na 133, K 3.6, Cr 2.28, WBC 4.0, Hgb 10.7, Plts 159  Family Communication: Pt in room, family at bedside  Disposition: Status is: Inpatient Remains inpatient appropriate because: severity of illness  Planned Discharge Destination: Home     Author: Rickey Barbara, MD 09/16/2023 4:10 PM  For on call review [email or web address].

## 2023-09-16 NOTE — Progress Notes (Signed)
   Heart Failure Stewardship Pharmacist Progress Note   PCP: [name], FNP PCP-Cardiologist: Thomasene Ripple, DO    HPI:  67 yo M with PMH of CAD s/p CABG in 2004, CHF, afib, HTN, HLD, CKD, DVT, CVA, and morbid obesity.   Presented to the ED on 9/20 with shortness of breath and L hand swelling. CXR and CT show evidence of pulmonary congestion and moderate R and small L sided pleural effusion. BNP 664. ECHO 9/21 showed LVEF 35-40% (was 30-35% in 02/2023), RWMA, mild LVH, mildly elevated PA pressure, small pericardial effusion, mild MR.   Current HF Medications: Diuretic: furosemide 120 mg IV BID + metolazone 5 mg x 1 Beta Blocker: carvedilol 3.125 mg BID SGLT2i: Jardiance 10 mg daily Other: hydralazine 25 mg TID + Imdur 15 mg daily  Prior to admission HF Medications: Diuretic: furosemide 40 mg BID MWF and 40 mg daily all other days + metolazone 2.5 mg once weekly Beta blocker: carvedilol 3.125 mg BID  Pertinent Lab Values: Serum creatinine 2.28, BUN 24, Potassium 3.6, Sodium 133, Magnesium 1.9, BNP 664.8  Vital Signs: Weight: 272 lbs (admission weight: 289 lbs) Blood pressure: 120/80s  Heart rate: 70-80s  I/O: incomplete yesterday; net -10.8L since admission  Medication Assistance / Insurance Benefits Check: Does the patient have prescription insurance?  No Type of insurance plan: only medicare A/B  Does the patient qualify for medication assistance through manufacturers or grants?   Yes Eligible grants and/or patient assistance programs: Jardiance, Xarelto Medication assistance applications in progress: Jardiance, Xarelto Medication assistance applications approved: none Approved medication assistance renewals will be completed by: pending  Outpatient Pharmacy:  Prior to admission outpatient pharmacy: [facility] Is the patient willing to use [facility] TOC pharmacy at discharge? Yes Is the patient willing to transition their outpatient pharmacy to utilize a [facility] outpatient  pharmacy?   No    Assessment: 1. Acute on chronic systolic CHF (LVEF 35-40%), due to ICM. NYHA class III symptoms. - Agree with increasing to furosemide 120 mg IV BID and adding metolazone 5 mg x 1. Still volume overloaded on exam. Strict I/Os and daily weights. Keep K>4 and Mg>2. - Continue carvedilol 3.125 mg BID - No ACE/ARB/ARNI or MRA with CKD - Continue Jardiance 10 mg daily - Continue hydralazine 25 mg TID + Imdur 15 mg daily   Plan: 1) Medication changes recommended at this time: - Continue IV diuresis   2) Patient assistance: - No prescription insurance found - starting Jardiance and Xarelto patient assistance   3)  Education  - Patient has been educated on current HF medications and potential additions to HF medication regimen - Patient verbalizes understanding that over the next few months, these medication doses may change and more medications may be added to optimize HF regimen - Patient has been educated on basic disease state pathophysiology and goals of therapy   Sharen Hones, PharmD, BCPS Heart Failure Engineer, building services Phone [phone]

## 2023-09-16 NOTE — Plan of Care (Signed)
  Problem: Education: °Goal: Knowledge of General Education information will improve °Description: Including pain rating scale, medication(s)/side effects and non-pharmacologic comfort measures °Outcome: Progressing °  °Problem: Health Behavior/Discharge Planning: °Goal: Ability to manage health-related needs will improve °Outcome: Progressing °  °Problem: Clinical Measurements: °Goal: Ability to maintain clinical measurements within normal limits will improve °Outcome: Progressing °Goal: Will remain free from infection °Outcome: Progressing °Goal: Diagnostic test results will improve °Outcome: Progressing °Goal: Cardiovascular complication will be avoided °Outcome: Progressing °  °Problem: Activity: °Goal: Risk for activity intolerance will decrease °Outcome: Progressing °  °Problem: Nutrition: °Goal: Adequate nutrition will be maintained °Outcome: Progressing °  °Problem: Coping: °Goal: Level of anxiety will decrease °Outcome: Progressing °  °Problem: Elimination: °Goal: Will not experience complications related to bowel motility °Outcome: Progressing °Goal: Will not experience complications related to urinary retention °Outcome: Progressing °  °Problem: Pain Managment: °Goal: General experience of comfort will improve °Outcome: Progressing °  °Problem: Safety: °Goal: Ability to remain free from injury will improve °Outcome: Progressing °  °Problem: Skin Integrity: °Goal: Risk for impaired skin integrity will decrease °Outcome: Progressing °  °Problem: Education: °Goal: Ability to demonstrate management of disease process will improve °Outcome: Progressing °Goal: Ability to verbalize understanding of medication therapies will improve °Outcome: Progressing °Goal: Individualized Educational Video(s) °Outcome: Progressing °  °Problem: Activity: °Goal: Capacity to carry out activities will improve °Outcome: Progressing °  °Problem: Cardiac: °Goal: Ability to achieve and maintain adequate cardiopulmonary perfusion will  improve °Outcome: Progressing °  °

## 2023-09-17 DIAGNOSIS — I5021 Acute systolic (congestive) heart failure: Secondary | ICD-10-CM | POA: Diagnosis not present

## 2023-09-17 DIAGNOSIS — J9601 Acute respiratory failure with hypoxia: Secondary | ICD-10-CM | POA: Diagnosis not present

## 2023-09-17 LAB — COMPREHENSIVE METABOLIC PANEL
ALT: 21 U/L (ref 0–44)
AST: 36 U/L (ref 15–41)
Albumin: 2.8 g/dL — ABNORMAL LOW (ref 3.5–5.0)
Alkaline Phosphatase: 183 U/L — ABNORMAL HIGH (ref 38–126)
Anion gap: 12 (ref 5–15)
BUN: 26 mg/dL — ABNORMAL HIGH (ref 8–23)
CO2: 30 mmol/L (ref 22–32)
Calcium: 8.8 mg/dL — ABNORMAL LOW (ref 8.9–10.3)
Chloride: 93 mmol/L — ABNORMAL LOW (ref 98–111)
Creatinine, Ser: 2.31 mg/dL — ABNORMAL HIGH (ref 0.61–1.24)
GFR, Estimated: 30 mL/min — ABNORMAL LOW (ref >60)
Glucose, Bld: 89 mg/dL (ref 70–99)
Potassium: 3.1 mmol/L — ABNORMAL LOW (ref 3.5–5.1)
Sodium: 135 mmol/L (ref 135–145)
Total Bilirubin: 3.5 mg/dL — ABNORMAL HIGH (ref 0.3–1.2)
Total Protein: 6.8 g/dL (ref 6.5–8.1)

## 2023-09-17 LAB — BASIC METABOLIC PANEL
Anion gap: 12 (ref 5–15)
BUN: 27 mg/dL — ABNORMAL HIGH (ref 8–23)
CO2: 33 mmol/L — ABNORMAL HIGH (ref 22–32)
Calcium: 9 mg/dL (ref 8.9–10.3)
Chloride: 89 mmol/L — ABNORMAL LOW (ref 98–111)
Creatinine, Ser: 2.44 mg/dL — ABNORMAL HIGH (ref 0.61–1.24)
GFR, Estimated: 28 mL/min — ABNORMAL LOW (ref >60)
Glucose, Bld: 108 mg/dL — ABNORMAL HIGH (ref 70–99)
Potassium: 3.4 mmol/L — ABNORMAL LOW (ref 3.5–5.1)
Sodium: 134 mmol/L — ABNORMAL LOW (ref 135–145)

## 2023-09-17 LAB — CBC
HCT: 30.7 % — ABNORMAL LOW (ref 39.0–52.0)
Hemoglobin: 10.4 g/dL — ABNORMAL LOW (ref 13.0–17.0)
MCH: 27.4 pg (ref 26.0–34.0)
MCHC: 33.9 g/dL (ref 30.0–36.0)
MCV: 80.8 fL (ref 80.0–100.0)
Platelets: 153 10*3/uL (ref 150–400)
RBC: 3.8 MIL/uL — ABNORMAL LOW (ref 4.22–5.81)
RDW: 18.2 % — ABNORMAL HIGH (ref 11.5–15.5)
WBC: 3.5 10*3/uL — ABNORMAL LOW (ref 4.0–10.5)
nRBC: 0 % (ref 0.0–0.2)

## 2023-09-17 LAB — MAGNESIUM: Magnesium: 2 mg/dL (ref 1.7–2.4)

## 2023-09-17 MED ORDER — POTASSIUM CHLORIDE CRYS ER 20 MEQ PO TBCR
40.0000 meq | EXTENDED_RELEASE_TABLET | Freq: Three times a day (TID) | ORAL | Status: DC
  Administered 2023-09-17: 40 meq via ORAL
  Filled 2023-09-17: qty 2

## 2023-09-17 MED ORDER — ACETAMINOPHEN 325 MG PO TABS
650.0000 mg | ORAL_TABLET | Freq: Four times a day (QID) | ORAL | Status: DC | PRN
  Administered 2023-09-18 – 2023-09-21 (×2): 650 mg via ORAL
  Filled 2023-09-17 (×2): qty 2

## 2023-09-17 MED ORDER — POTASSIUM CHLORIDE CRYS ER 20 MEQ PO TBCR
40.0000 meq | EXTENDED_RELEASE_TABLET | ORAL | Status: AC
  Administered 2023-09-17 (×2): 40 meq via ORAL
  Filled 2023-09-17: qty 2

## 2023-09-17 MED ORDER — COLCHICINE 0.6 MG PO TABS
0.6000 mg | ORAL_TABLET | Freq: Every day | ORAL | Status: DC | PRN
  Administered 2023-09-17 – 2023-09-18 (×2): 0.6 mg via ORAL
  Filled 2023-09-17 (×2): qty 1

## 2023-09-17 MED ORDER — POTASSIUM CHLORIDE CRYS ER 20 MEQ PO TBCR: 40.0000 meq | EXTENDED_RELEASE_TABLET | Freq: Two times a day (BID) | ORAL | Status: DC

## 2023-09-17 NOTE — Plan of Care (Signed)
Problem: Education: Goal: Knowledge of General Education information will improve Description Including pain rating scale, medication(s)/side effects and non-pharmacologic comfort measures Outcome: Progressing   Problem: Health Behavior/Discharge Planning: Goal: Ability to manage health-related needs will improve Outcome: Progressing   Problem: Clinical Measurements: Goal: Ability to maintain clinical measurements within normal limits will improve Outcome: Progressing Goal: Will remain free from infection Outcome: Progressing Goal: Diagnostic test results will improve Outcome: Progressing Goal: Cardiovascular complication will be avoided Outcome: Progressing   Problem: Activity: Goal: Risk for activity intolerance will decrease Outcome: Progressing   Problem: Nutrition: Goal: Adequate nutrition will be maintained Outcome: Progressing   Problem: Coping: Goal: Level of anxiety will decrease Outcome: Progressing   Problem: Elimination: Goal: Will not experience complications related to bowel motility Outcome: Progressing Goal: Will not experience complications related to urinary retention Outcome: Progressing   Problem: Pain Managment: Goal: General experience of comfort will improve Outcome: Progressing   Problem: Safety: Goal: Ability to remain free from injury will improve Outcome: Progressing   Problem: Skin Integrity: Goal: Risk for impaired skin integrity will decrease Outcome: Progressing   Problem: Education: Goal: Ability to demonstrate management of disease process will improve Outcome: Progressing Goal: Ability to verbalize understanding of medication therapies will improve Outcome: Progressing Goal: Individualized Educational Video(s) Outcome: Progressing   Problem: Activity: Goal: Capacity to carry out activities will improve Outcome: Progressing   Problem: Cardiac: Goal: Ability to achieve and maintain adequate cardiopulmonary perfusion will  improve Outcome: Progressing

## 2023-09-17 NOTE — Progress Notes (Signed)
   Heart Failure Stewardship Pharmacist Progress Note   PCP: [name], FNP PCP-Cardiologist: Thomasene Ripple, DO    HPI:  67 yo M with PMH of CAD s/p CABG in 2004, CHF, afib, HTN, HLD, CKD, DVT, CVA, and morbid obesity.   Presented to the ED on 9/20 with shortness of breath and L hand swelling. CXR and CT show evidence of pulmonary congestion and moderate R and small L sided pleural effusion. BNP 664. ECHO 9/21 showed LVEF 35-40% (was 30-35% in 02/2023), RWMA, mild LVH, mildly elevated PA pressure, small pericardial effusion, mild MR.   Current HF Medications: Diuretic: furosemide 120 mg IV BID Beta Blocker: carvedilol 3.125 mg BID SGLT2i: Jardiance 10 mg daily Other: hydralazine 25 mg TID + Imdur 15 mg daily  Prior to admission HF Medications: Diuretic: furosemide 40 mg BID MWF and 40 mg daily all other days + metolazone 2.5 mg once weekly Beta blocker: carvedilol 3.125 mg BID  Pertinent Lab Values: Serum creatinine 2.31, BUN 26, Potassium 3.1, Sodium 135, Magnesium 2.0, BNP 664.8  Vital Signs: Weight: 251 lbs (admission weight: 289 lbs) Blood pressure: 110/70s  Heart rate: 60-70s  I/O: net -3.5L yesterday; net -15.6L since admission  Medication Assistance / Insurance Benefits Check: Does the patient have prescription insurance?  No Type of insurance plan: only medicare A/B  Does the patient qualify for medication assistance through manufacturers or grants?   Yes Eligible grants and/or patient assistance programs: Jardiance, Xarelto Medication assistance applications in progress: Jardiance, Xarelto Medication assistance applications approved: none Approved medication assistance renewals will be completed by: pending  Outpatient Pharmacy:  Prior to admission outpatient pharmacy: [facility] Is the patient willing to use [facility] pharmacy at discharge? Yes Is the patient willing to transition their outpatient pharmacy to utilize a [facility] outpatient pharmacy?   No     Assessment: 1. Acute on chronic systolic CHF (LVEF 35-40%), due to ICM. NYHA class III symptoms. - Continue furosemide 120 mg IV BID. Still volume overloaded on exam. Strict I/Os and daily weights. Keep K>4 and Mg>2. KCl 40 mEq x 2 ordered for replacement.  - Continue carvedilol 3.125 mg BID - No ACE/ARB/ARNI or MRA with CKD - Continue Jardiance 10 mg daily - Continue hydralazine 25 mg TID + Imdur 15 mg daily   Plan: 1) Medication changes recommended at this time: - Continue IV diuresis  - May redose metolazone pending repeat K  2) Patient assistance: - No prescription insurance found - starting Jardiance and Xarelto patient assistance   3)  Education  - Patient has been educated on current HF medications and potential additions to HF medication regimen - Patient verbalizes understanding that over the next few months, these medication doses may change and more medications may be added to optimize HF regimen - Patient has been educated on basic disease state pathophysiology and goals of therapy   Sharen Hones, PharmD, BCPS Heart Failure Engineer, building services Phone [phone]

## 2023-09-17 NOTE — Assessment & Plan Note (Signed)
Advised to go to ER for evaluation

## 2023-09-17 NOTE — Progress Notes (Signed)
Connor Burns.  ZOX:[id number] DOB: [birth year] DOA: 09/12/2023 PCP: [name], FNP    Brief Narrative:  67 year old with a history of chronic systolic CHF, ischemic cardiomyopathy status post AICD, HLD, chronic atrial fibrillation, CAD, CVA, CKD stage IIIb, and obesity who presented to the ER 9/20 with progressively worsening shortness of breath over a several week timeframe.  In the ER he was found to be hypoxic with room air saturation 86%.  CXR was consistent with pulmonary edema with bilateral pleural effusions.  Goals of Care:   Code Status: Full Code   DVT prophylaxis: rivaroxaban (XARELTO) tablet 20 mg   Interim Hx: No acute events recorded overnight.  Afebrile.  Vital signs stable.  Some hypokalemia appreciated this morning.  Remains volume overloaded but has no new complaints.  Is alert conversant and pleasant.  Reports that he was compliant with a low-salt diet.  Assessment & Plan:  Acute hypoxic respiratory failure As evidenced by oxygen saturation of 86% on room air and dyspnea at time of presentation -due to pulmonary edema/pleural effusions -improving with diuresis  Acute exacerbation of chronic ischemic systolic CHF Avoiding ACE inhibitor due to renal insufficiency -TTE this admission notes EF 35-40% which appears consistent with his range since 2019 - remains volume overloaded but is responding to diuresis thus far -felt to be due to inconsistent use of medications at home  Ischemic cardiomyopathy Status post AICD -continue aspirin carvedilol and rosuvastatin -EP found inappropriate pacing spikes that time on telemetry with device under sensing atrial fibrillation and therefore sensitivity was adjusted  CAD Had an abnormal nuclear stress test previously and favored medical management due to renal dysfunction -no acute chest pain -continue medical therapy  Permanent atrial fibrillation Carvedilol for rate control -Xarelto with CHA2DS2-VASc score of 4  Stage IIIb  CKD Creatinine stable with diuresis for now  Obesity - Body mass index is 32.3 kg/m.  History of CVA Continue aspirin and rosuvastatin  Left upper extremity edema Venous duplex negative for DVT -likely also related to CHF exacerbation  Hypokalemia Due to diuresis -supplement and follow -magnesium is normal  Family Communication:  Disposition: Anticipate discharge home with HH   Objective: Blood pressure (!) 120/59, pulse 73, temperature 97.9 F (36.6 C), temperature source Oral, resp. rate 16, height 6\' 2"  (1.88 m), weight 114.1 kg, SpO2 100%.  Intake/Output Summary (Last 24 hours) at 09/17/2023 1055 Last data filed at 09/17/2023 0919 Gross per 24 hour  Intake 422 ml  Output 3850 ml  Net -3428 ml   Filed Weights   09/14/23 0358 09/16/23 0425 09/17/23 0604  Weight: 125.6 kg 123.7 kg 114.1 kg    Examination: General: No acute respiratory distress Lungs: Clear to auscultation bilaterally without wheezes or crackles Cardiovascular: Regular rate and rhythm without murmur gallop or rub normal S1 and S2 Abdomen: Nontender, nondistended, soft, bowel sounds positive, no rebound, no ascites, no appreciable mass Extremities: Diffuse edema/anasarca at 2+  CBC: Recent Labs  Lab 09/12/23 1315 09/13/23 0450 09/15/23 0458 09/16/23 0338 09/17/23 0419  WBC 5.8   < > 4.0 4.0 3.5*  NEUTROABS 3.4  --   --   --   --   HGB 11.5*   < > 10.0* 10.7* 10.4*  HCT 34.2*   < > 30.2* 31.9* 30.7*  MCV 81.6   < > 80.3 79.9* 80.8  PLT 179   < > 156 159 153   < > = values in this interval not displayed.   Basic Metabolic Panel: Recent  Labs  Lab 09/15/23 0457 09/15/23 0458 09/16/23 0338 09/17/23 0419  NA  --  133* 133* 135  K  --  3.2* 3.6 3.1*  CL  --  95* 93* 93*  CO2  --  28 25 30   GLUCOSE  --  132* 97 89  BUN  --  23 24* 26*  CREATININE  --  2.26* 2.28* 2.31*  CALCIUM  --  8.4* 8.7* 8.8*  MG 1.8  --  1.9 2.0   GFR: Estimated Creatinine Clearance: 41.7 mL/min (A) (by C-G  formula based on SCr of 2.31 mg/dL (H)).   Scheduled Meds:  aspirin EC  81 mg Oral QHS   atorvastatin  20 mg Oral Daily   carvedilol  3.125 mg Oral BID   colchicine  0.6 mg Oral BID   cyanocobalamin  1,000 mcg Oral Daily   donepezil  10 mg Oral Daily   empagliflozin  10 mg Oral Daily   hydrALAZINE  25 mg Oral Q8H   isosorbide mononitrate  15 mg Oral Daily   potassium chloride  40 mEq Oral Q4H   potassium chloride  40 mEq Oral BID   rivaroxaban  20 mg Oral Q supper   Continuous Infusions:  furosemide 120 mg (09/17/23 0916)     LOS: 5 days   Lonia Blood, MD [facility] Office  [phone] Pager - Text Page per Loretha Stapler  If 7PM-7AM, please contact night-coverage per Amion 09/17/2023, 10:55 AM

## 2023-09-17 NOTE — Progress Notes (Signed)
Cardiology Progress Note  Patient ID: Connor Burns. MRN: [id number] DOB: 03/10/1956 Date of Encounter: 09/17/2023  Primary Cardiologist: Thomasene Ripple, DO  Subjective   Chief Complaint: SOB  HPI: Good diuresis.  Still volume overloaded.  ROS:  All other ROS reviewed and negative. Pertinent positives noted in the HPI.     Inpatient Medications  Scheduled Meds:  aspirin EC  81 mg Oral QHS   atorvastatin  20 mg Oral Daily   carvedilol  3.125 mg Oral BID   colchicine  0.6 mg Oral BID   cyanocobalamin  1,000 mcg Oral Daily   donepezil  10 mg Oral Daily   empagliflozin  10 mg Oral Daily   hydrALAZINE  25 mg Oral Q8H   isosorbide mononitrate  15 mg Oral Daily   potassium chloride  40 mEq Oral Q4H   potassium chloride  40 mEq Oral BID   rivaroxaban  20 mg Oral Q supper   Continuous Infusions:  furosemide 120 mg (09/17/23 0916)   PRN Meds: acetaminophen **OR** acetaminophen, albuterol, ipratropium-albuterol, melatonin, ondansetron **OR** ondansetron (ZOFRAN) IV, mouth rinse   Vital Signs   Vitals:   09/16/23 2000 09/16/23 2122 09/17/23 0604 09/17/23 0846  BP:  110/70 107/67 (!) 120/59  Pulse:  76 73   Resp:  18 16   Temp:  97.9 F (36.6 C) 97.9 F (36.6 C)   TempSrc:  Oral Oral   SpO2: 100% 100%    Weight:   114.1 kg   Height:        Intake/Output Summary (Last 24 hours) at 09/17/2023 0924 Last data filed at 09/17/2023 0919 Gross per 24 hour  Intake 62 ml  Output 3850 ml  Net -3788 ml      09/17/2023    6:04 AM 09/16/2023    4:25 AM 09/14/2023    3:58 AM  Last 3 Weights  Weight (lbs) 251 lb 9.6 oz 272 lb 11.3 oz 276 lb 14.4 oz  Weight (kg) 114.125 kg 123.7 kg 125.601 kg      Telemetry  Overnight telemetry shows A-fib heart rate 70s, which I personally reviewed.    Physical Exam   Vitals:   09/16/23 2000 09/16/23 2122 09/17/23 0604 09/17/23 0846  BP:  110/70 107/67 (!) 120/59  Pulse:  76 73   Resp:  18 16   Temp:  97.9 F (36.6 C) 97.9 F (36.6 C)    TempSrc:  Oral Oral   SpO2: 100% 100%    Weight:   114.1 kg   Height:        Intake/Output Summary (Last 24 hours) at 09/17/2023 0924 Last data filed at 09/17/2023 0919 Gross per 24 hour  Intake 62 ml  Output 3850 ml  Net -3788 ml       09/17/2023    6:04 AM 09/16/2023    4:25 AM 09/14/2023    3:58 AM  Last 3 Weights  Weight (lbs) 251 lb 9.6 oz 272 lb 11.3 oz 276 lb 14.4 oz  Weight (kg) 114.125 kg 123.7 kg 125.601 kg    Body mass index is 32.3 kg/m.  General: Well nourished, well developed, in no acute distress Head: Atraumatic, normal size  Eyes: PEERLA, EOMI  Neck: Supple, JVD 12 to 15 cm of water Endocrine: No thryomegaly Cardiac: Normal S1, S2; irregular rhythm Lungs: Diminished breath sounds Abd: Soft, nontender, no hepatomegaly  Ext: 2+ pitting edema up to knees Musculoskeletal: No deformities, BUE and BLE strength normal and equal Skin: Warm and  dry, no rashes   Neuro: Alert and oriented to person, place, time, and situation, CNII-XII grossly intact, no focal deficits  Psych: Normal mood and affect   Labs  High Sensitivity Troponin:   Recent Labs  Lab 09/12/23 1315 09/12/23 1542  TROPONINIHS 39* 37*     Cardiac EnzymesNo results for input(s): "TROPONINI" in the last 168 hours. No results for input(s): "TROPIPOC" in the last 168 hours.  Chemistry Recent Labs  Lab 09/15/23 0458 09/16/23 0338 09/17/23 0419  NA 133* 133* 135  K 3.2* 3.6 3.1*  CL 95* 93* 93*  CO2 28 25 30   GLUCOSE 132* 97 89  BUN 23 24* 26*  CREATININE 2.26* 2.28* 2.31*  CALCIUM 8.4* 8.7* 8.8*  PROT 6.4* 7.2 6.8  ALBUMIN 2.7* 3.0* 2.8*  AST 31 35 36  ALT 19 23 21   ALKPHOS 174* 190* 183*  BILITOT 3.8* 4.0* 3.5*  GFRNONAA 31* 31* 30*  ANIONGAP 10 15 12     Hematology Recent Labs  Lab 09/15/23 0458 09/16/23 0338 09/17/23 0419  WBC 4.0 4.0 3.5*  RBC 3.76* 3.99* 3.80*  HGB 10.0* 10.7* 10.4*  HCT 30.2* 31.9* 30.7*  MCV 80.3 79.9* 80.8  MCH 26.6 26.8 27.4  MCHC 33.1 33.5 33.9   RDW 18.7* 18.4* 18.2*  PLT 156 159 153   BNP Recent Labs  Lab 09/12/23 1315  BNP 664.8*    DDimer No results for input(s): "DDIMER" in the last 168 hours.   Radiology  No results found.  Cardiac Studies  TTE 09/13/2023  1. Left ventricular ejection fraction, by estimation, is 35 to 40%. The  left ventricle has moderately decreased function. The left ventricle  demonstrates regional wall motion abnormalities (see scoring  diagram/findings for description). There is mild  left ventricular hypertrophy. Left ventricular diastolic parameters are  indeterminate.   2. No formed LV mural thrombus noted with Definity contrast.   3. Right ventricular systolic function is mildly reduced. The right  ventricular size is not well visualized. There is mildly elevated  pulmonary artery systolic pressure. The estimated right ventricular  systolic pressure is 40.2 mmHg.   4. A small pericardial effusion is present. The pericardial effusion is  posterior to the left ventricle.   5. The mitral valve is degenerative. Mild mitral valve regurgitation.   6. The aortic valve is tricuspid. There is mild calcification of the  aortic valve. Aortic valve regurgitation is not visualized. Aortic valve  sclerosis is present, with no evidence of aortic valve stenosis.   7. Aortic dilatation noted. There is borderline dilatation of the  ascending aorta, measuring 37 mm.   8. The inferior vena cava is dilated in size with <50% respiratory  variability, suggesting right atrial pressure of 15 mmHg.   Patient Profile  67 year old male with history of CAD status post CABG, systolic heart failure/ischemic cardiomyopathy, CKD stage IIIb, obesity, CVA who was admitted on 09/12/2023 for acute on chronic systolic heart failure.   Assessment & Plan   # Acute on chronic systolic heart failure, EF 35 to 40% # Ischemic cardiomyopathy status post ICD # Hypokalemia -Good diuresis with metolazone yesterday.  Still volume  overloaded.  Potassium is a little too low for metolazone today.  We will give him 40 mill equivalents of potassium every 4 hours for 2 doses.  He will likely need potassium 3 times per day.  We will recheck a BMP at 2:00.  He is on Lasix 120 mg IV twice daily which we will continue.  If potassium is okay I will likely give him another dose of metolazone today. -Continue carvedilol, hydralazine and Imdur.  He is on Jardiance. -No ACE/ARB/Arni given CKD stage IIIb  # Longstanding persistent A-fib -Rates controlled would benefit from outpatient restoration of sinus rhythm.  On Xarelto.  May need to consider Eliquis given kidney disease.  # CKD stage IIIb -Stable      For questions or updates, please contact [facility] Please consult [email or web address] for contact info under        Signed, Gerri Spore T. Flora Lipps, MD, [facility]  [facility]  09/17/2023 9:24 AM

## 2023-09-18 DIAGNOSIS — J9601 Acute respiratory failure with hypoxia: Secondary | ICD-10-CM | POA: Diagnosis not present

## 2023-09-18 DIAGNOSIS — I5021 Acute systolic (congestive) heart failure: Secondary | ICD-10-CM | POA: Diagnosis not present

## 2023-09-18 LAB — BASIC METABOLIC PANEL
Anion gap: 10 (ref 5–15)
Anion gap: 13 (ref 5–15)
BUN: 27 mg/dL — ABNORMAL HIGH (ref 8–23)
BUN: 26 mg/dL — ABNORMAL HIGH (ref 8–23)
CO2: 33 mmol/L — ABNORMAL HIGH (ref 22–32)
CO2: 29 mmol/L (ref 22–32)
Calcium: 8.7 mg/dL — ABNORMAL LOW (ref 8.9–10.3)
Calcium: 8.3 mg/dL — ABNORMAL LOW (ref 8.9–10.3)
Chloride: 90 mmol/L — ABNORMAL LOW (ref 98–111)
Chloride: 89 mmol/L — ABNORMAL LOW (ref 98–111)
Creatinine, Ser: 2.53 mg/dL — ABNORMAL HIGH (ref 0.61–1.24)
Creatinine, Ser: 2.47 mg/dL — ABNORMAL HIGH (ref 0.61–1.24)
GFR, Estimated: 27 mL/min — ABNORMAL LOW (ref >60)
GFR, Estimated: 28 mL/min — ABNORMAL LOW (ref >60)
Glucose, Bld: 80 mg/dL (ref 70–99)
Glucose, Bld: 110 mg/dL — ABNORMAL HIGH (ref 70–99)
Potassium: 3 mmol/L — ABNORMAL LOW (ref 3.5–5.1)
Potassium: 4.2 mmol/L (ref 3.5–5.1)
Sodium: 133 mmol/L — ABNORMAL LOW (ref 135–145)
Sodium: 131 mmol/L — ABNORMAL LOW (ref 135–145)

## 2023-09-18 LAB — MAGNESIUM: Magnesium: 1.9 mg/dL (ref 1.7–2.4)

## 2023-09-18 LAB — GLUCOSE, CAPILLARY: Glucose-Capillary: 124 mg/dL — ABNORMAL HIGH (ref 70–99)

## 2023-09-18 MED ORDER — POTASSIUM CHLORIDE CRYS ER 20 MEQ PO TBCR
40.0000 meq | EXTENDED_RELEASE_TABLET | ORAL | Status: DC
  Administered 2023-09-18: 40 meq via ORAL
  Filled 2023-09-18: qty 2

## 2023-09-18 MED ORDER — POTASSIUM CHLORIDE CRYS ER 20 MEQ PO TBCR: 40.0000 meq | EXTENDED_RELEASE_TABLET | ORAL | Status: DC

## 2023-09-18 MED ORDER — APIXABAN 5 MG PO TABS
5.0000 mg | ORAL_TABLET | Freq: Two times a day (BID) | ORAL | Status: DC
  Administered 2023-09-18 – 2023-09-22 (×8): 5 mg via ORAL
  Filled 2023-09-18 (×8): qty 1

## 2023-09-18 MED ORDER — POTASSIUM CHLORIDE CRYS ER 20 MEQ PO TBCR
40.0000 meq | EXTENDED_RELEASE_TABLET | Freq: Three times a day (TID) | ORAL | Status: DC
  Administered 2023-09-18 – 2023-09-19 (×3): 40 meq via ORAL
  Filled 2023-09-18 (×4): qty 2

## 2023-09-18 NOTE — Progress Notes (Signed)
ANTICOAGULATION CONSULT NOTE  Pharmacy Consult for Xarelto > Eliquis Indication: atrial fibrillation  Allergies  Allergen Reactions   Clopidogrel Anaphylaxis and Other (See Comments)    "Wierd, like out of body thing" Out of body experience    Plavix [Clopidogrel Bisulfate]     Patient Measurements: Height: 6\' 2"  (188 cm) Weight: 113.4 kg (250 lb) IBW/kg (Calculated) : 82.2  Vital Signs: Temp: 97.9 F (36.6 C) (09/26 1636) BP: 108/66 (09/26 1436) Pulse Rate: 66 (09/26 1436)  Labs: Recent Labs    09/16/23 0338 09/17/23 0419 09/17/23 1418 09/18/23 0444 09/18/23 1505  HGB 10.7* 10.4*  --   --   --   HCT 31.9* 30.7*  --   --   --   PLT 159 153  --   --   --   CREATININE 2.28* 2.31* 2.44* 2.53* 2.47*    Estimated Creatinine Clearance: 38.9 mL/min (A) (by C-G formula based on SCr of 2.47 mg/dL (H)).   Assessment: 67 y.o. male with medical history significant for hx DVT, and persistent atrial fibrillation with hx of prior ablation on Xarelto PTA who is admitted with acute on chronic HFrEF. Pharmacy consulted to transition from Xarelto to Eliquis due to ongoing renal dysfunction. LD of Xarelto 9/25 @ 1800.   Goal of Therapy:  Monitor platelets by anticoagulation protocol: Yes   Plan:  Stop Xarelto Start Eliquis 5mg  PO twice daily this evening (first dose @ 18:00)  Thank you for allowing pharmacy to be a part of this patient's care.  Thelma Barge, PharmD Clinical Pharmacist

## 2023-09-18 NOTE — Progress Notes (Signed)
Connor Burns.  ZOX:[id number] DOB: [birth year] DOA: 09/12/2023 PCP: [name], FNP    Brief Narrative:  67 year old with a history of chronic systolic CHF, ischemic cardiomyopathy status post AICD, HLD, chronic atrial fibrillation, CAD, CVA, CKD stage IIIb, and obesity who presented to the ER 9/20 with progressively worsening shortness of breath over a several week timeframe.  In the ER he was found to be hypoxic with room air saturation 86%.  CXR was consistent with pulmonary edema with bilateral pleural effusions.  Goals of Care:   Code Status: Full Code   DVT prophylaxis: rivaroxaban (XARELTO) tablet 20 mg   Interim Hx: Patient refused morning meds and nurse assessment today.  Afebrile.  Vital signs stable.  Oxygen saturation 97% on room air.  Creatinine beginning to trend upward with ongoing diuresis.  Significant improvement in lower extremity edema appreciated but bilateral upper extremity edema persists.  Assessment & Plan:  Acute hypoxic respiratory failure As evidenced by oxygen saturation of 86% on room air and dyspnea at time of presentation -due to pulmonary edema/pleural effusions -resolved with diuresis  Acute exacerbation of chronic ischemic systolic CHF Avoiding ACE inhibitor due to renal insufficiency -TTE this admission notes EF 35-40% which appears consistent with his range since 2019 - remains volume overloaded but is responding to diuresis thus far -felt to be due to inconsistent use of medications at home -net negative approximately 19 L since admission  Ischemic cardiomyopathy Status post AICD -continue aspirin carvedilol and rosuvastatin -EP found inappropriate pacing spikes on telemetry with device under sensing atrial fibrillation and therefore sensitivity was adjusted  CAD Had an abnormal nuclear stress test previously and favored medical management due to renal dysfunction -no acute chest pain -continue medical therapy  Permanent atrial  fibrillation Carvedilol for rate control -Xarelto with CHA2DS2-VASc score of 4  Stage IIIb CKD Creatinine beginning to climb with ongoing aggressive diuresis  Normocytic anemia Likely due to chronic kidney disease -check anemia panel  Obesity - Body mass index is 32.1 kg/m.  History of CVA Continue aspirin and rosuvastatin  Left upper extremity edema Venous duplex negative for DVT -likely also related to CHF exacerbation  Hypokalemia Due to diuresis -continue to supplement and follow -magnesium is normal  Family Communication:  Disposition: Anticipate discharge home with HH   Objective: Blood pressure 108/66, pulse 66, temperature 98.3 F (36.8 C), temperature source Oral, resp. rate 12, height 6\' 2"  (1.88 m), weight 113.4 kg, SpO2 97%.  Intake/Output Summary (Last 24 hours) at 09/18/2023 1625 Last data filed at 09/18/2023 1000 Gross per 24 hour  Intake 480 ml  Output 4350 ml  Net -3870 ml   Filed Weights   09/16/23 0425 09/17/23 0604 09/18/23 0240  Weight: 123.7 kg 114.1 kg 113.4 kg    Examination: General: No acute respiratory distress Lungs: Clear to auscultation bilaterally -no wheezing Cardiovascular: Regular rate and rhythm without murmur  Abdomen: Nontender, nondistended, soft, bowel sounds positive, no rebound, no ascites, no appreciable mass Extremities: Only trace edema bilateral lower extremities with persisting 2+ edema bilateral upper extremities  CBC: Recent Labs  Lab 09/12/23 1315 09/13/23 0450 09/15/23 0458 09/16/23 0338 09/17/23 0419  WBC 5.8   < > 4.0 4.0 3.5*  NEUTROABS 3.4  --   --   --   --   HGB 11.5*   < > 10.0* 10.7* 10.4*  HCT 34.2*   < > 30.2* 31.9* 30.7*  MCV 81.6   < > 80.3 79.9* 80.8  PLT 179   < >  156 159 153   < > = values in this interval not displayed.   Basic Metabolic Panel: Recent Labs  Lab 09/16/23 0338 09/17/23 0419 09/17/23 1418 09/18/23 0444 09/18/23 1505  NA 133* 135 134* 133* 131*  K 3.6 3.1* 3.4* 3.0*  4.2  CL 93* 93* 89* 90* 89*  CO2 25 30 33* 33* 29  GLUCOSE 97 89 108* 80 110*  BUN 24* 26* 27* 27* 26*  CREATININE 2.28* 2.31* 2.44* 2.53* 2.47*  CALCIUM 8.7* 8.8* 9.0 8.7* 8.3*  MG 1.9 2.0  --  1.9  --    GFR: Estimated Creatinine Clearance: 38.9 mL/min (A) (by C-G formula based on SCr of 2.47 mg/dL (H)).   Scheduled Meds:  aspirin EC  81 mg Oral QHS   atorvastatin  20 mg Oral Daily   carvedilol  3.125 mg Oral BID   cyanocobalamin  1,000 mcg Oral Daily   donepezil  10 mg Oral Daily   empagliflozin  10 mg Oral Daily   hydrALAZINE  25 mg Oral Q8H   isosorbide mononitrate  15 mg Oral Daily   rivaroxaban  20 mg Oral Q supper     LOS: 6 days   Lonia Blood, MD [facility] Office  [phone] Pager - Text Page per Loretha Stapler  If 7PM-7AM, please contact night-coverage per Amion 09/18/2023, 4:25 PM

## 2023-09-18 NOTE — Progress Notes (Addendum)
Reviewed repeat BMET with Dr. Flora Lipps. Unfortunately the sample was hemolyzed so unable to accurately assess potassium before resuming diuretic. Needs repeat fresh draw. Have ordered repeat stat BMET.  Addendum: received update from nurse that patient unwilling to take any of his AM meds until around 1430 so has only gotten one dose of potassium thus far, next dose due after 6pm, which would set back timing of f/u BMET and potential restart of diuretic right before bedtime. Therefore per d/w Dr. Flora Lipps we will plan for AM labs instead to help guide next diuretic dosing. He also suggests changing Xarelto to Eliquis given the issues with Cr/need for dosing adjustment this admission.

## 2023-09-18 NOTE — Progress Notes (Signed)
Pt refusing all AM meds and for RN to perform AM shift assessment and vitals.  RN will attempt again when wife returns to the room.

## 2023-09-18 NOTE — Progress Notes (Signed)
Progress Note  Patient Name: Connor Burns. Date of Encounter: 09/18/2023  Primary Cardiologist: Thomasene Ripple, DO  Subjective   Reports his only complaint is that he is hungry and wondering where breakfast tray is. I notified Licensed conveyancer. Denies CP or SOB. He's unsure if he ever had edema.  Inpatient Medications    Scheduled Meds:  aspirin EC  81 mg Oral QHS   atorvastatin  20 mg Oral Daily   carvedilol  3.125 mg Oral BID   cyanocobalamin  1,000 mcg Oral Daily   donepezil  10 mg Oral Daily   empagliflozin  10 mg Oral Daily   hydrALAZINE  25 mg Oral Q8H   isosorbide mononitrate  15 mg Oral Daily   potassium chloride  40 mEq Oral Q4H   rivaroxaban  20 mg Oral Q supper   Continuous Infusions:  PRN Meds: acetaminophen, albuterol, colchicine, ipratropium-albuterol, melatonin, ondansetron **OR** ondansetron (ZOFRAN) IV, mouth rinse   Vital Signs    Vitals:   09/18/23 0000 09/18/23 0240 09/18/23 0400 09/18/23 0525  BP:  (!) 102/55  110/64  Pulse:  78    Resp:  12    Temp:  98.3 F (36.8 C)    TempSrc:  Oral    SpO2: 97% 96% 97%   Weight:  113.4 kg    Height:        Intake/Output Summary (Last 24 hours) at 09/18/2023 0817 Last data filed at 09/18/2023 0248 Gross per 24 hour  Intake 840 ml  Output 6400 ml  Net -5560 ml      09/18/2023    2:40 AM 09/17/2023    6:04 AM 09/16/2023    4:25 AM  Last 3 Weights  Weight (lbs) 250 lb 251 lb 9.6 oz 272 lb 11.3 oz  Weight (kg) 113.4 kg 114.125 kg 123.7 kg     Telemetry    Atrial fib, occ A pacing, occ PVCs - Personally Reviewed  Physical Exam   GEN: No acute distress.  HEENT: Normocephalic, atraumatic, sclera non-icteric. Neck: No JVD or bruits. Cardiac: irregularly irregular, no murmurs, rubs, or gallops.  Respiratory:  diminished BS bilaterally. Breathing is unlabored. GI: Soft, nontender, non-distended, BS +x 4. MS: no deformity. Extremities: No clubbing or cyanosis. Distal pedal pulses are 2+ and equal  bilaterally. Tense BLE extremities with 1+ pitting edema but striations present indicative of significant edema previously Neuro:  AAOx3. Follows commands. Psych:  Responds to questions appropriately with a normal affect.  Labs    High Sensitivity Troponin:   Recent Labs  Lab 09/12/23 1315 09/12/23 1542  TROPONINIHS 39* 37*      Cardiac EnzymesNo results for input(s): "TROPONINI" in the last 168 hours. No results for input(s): "TROPIPOC" in the last 168 hours.   Chemistry Recent Labs  Lab 09/15/23 0458 09/16/23 0338 09/17/23 0419 09/17/23 1418 09/18/23 0444  NA 133* 133* 135 134* 133*  K 3.2* 3.6 3.1* 3.4* 3.0*  CL 95* 93* 93* 89* 90*  CO2 28 25 30  33* 33*  GLUCOSE 132* 97 89 108* 80  BUN 23 24* 26* 27* 27*  CREATININE 2.26* 2.28* 2.31* 2.44* 2.53*  CALCIUM 8.4* 8.7* 8.8* 9.0 8.7*  PROT 6.4* 7.2 6.8  --   --   ALBUMIN 2.7* 3.0* 2.8*  --   --   AST 31 35 36  --   --   ALT 19 23 21   --   --   ALKPHOS 174* 190* 183*  --   --  BILITOT 3.8* 4.0* 3.5*  --   --   GFRNONAA 31* 31* 30* 28* 27*  ANIONGAP 10 15 12 12 10      Hematology Recent Labs  Lab 09/15/23 0458 09/16/23 0338 09/17/23 0419  WBC 4.0 4.0 3.5*  RBC 3.76* 3.99* 3.80*  HGB 10.0* 10.7* 10.4*  HCT 30.2* 31.9* 30.7*  MCV 80.3 79.9* 80.8  MCH 26.6 26.8 27.4  MCHC 33.1 33.5 33.9  RDW 18.7* 18.4* 18.2*  PLT 156 159 153    BNP Recent Labs  Lab 09/12/23 1315  BNP 664.8*     DDimer No results for input(s): "DDIMER" in the last 168 hours.   Radiology    No results found.  Cardiac Studies   2d echo 09/13/23   1. Left ventricular ejection fraction, by estimation, is 35 to 40%. The  left ventricle has moderately decreased function. The left ventricle  demonstrates regional wall motion abnormalities (see scoring  diagram/findings for description). There is mild  left ventricular hypertrophy. Left ventricular diastolic parameters are  indeterminate.   2. No formed LV mural thrombus noted with  Definity contrast.   3. Right ventricular systolic function is mildly reduced. The right  ventricular size is not well visualized. There is mildly elevated  pulmonary artery systolic pressure. The estimated right ventricular  systolic pressure is 40.2 mmHg.   4. A small pericardial effusion is present. The pericardial effusion is  posterior to the left ventricle.   5. The mitral valve is degenerative. Mild mitral valve regurgitation.   6. The aortic valve is tricuspid. There is mild calcification of the  aortic valve. Aortic valve regurgitation is not visualized. Aortic valve  sclerosis is present, with no evidence of aortic valve stenosis.   7. Aortic dilatation noted. There is borderline dilatation of the  ascending aorta, measuring 37 mm.   8. The inferior vena cava is dilated in size with <50% respiratory  variability, suggesting right atrial pressure of 15 mmHg.   Comparison(s): Prior images unable to be directly viewed.   Patient Profile     67 y.o. male with CAD status post CABG 2004 in [place], chronic HFrEF/ischemic cardiomyopathy, CKD stage IIIb, obesity, CVA, aortic atherosclerosis, VT, DVT, morbid obesity, mild dementia, longstanding persistent atrial fibrillation with reported prior ablation, admitted with acute on chronic HFrEF, medication nonadherence due to cost. Admitted with acute hypoxic respiratory failure felt due to pulmonary edema/pleural effusions and acute on chronic HFrEF.   Assessment & Plan    1. Acute on chronic HFrEF, HTN - down 18L this admission with slight uptick in CO2, Cr - current rx: carvedilol 3.125mg  BID, Jardiance 10mg  daily, hydralazine 25mg  TID, Imdur 15mg  daily, KCl supp, IV lasix currently held per Dr. Marylene Buerger preliminary order - - will review med plan with MD  2 CKD 3b, hyponatremia, hypokalemia, rising CO2 - prior outpatient Cr values around 2.4, admitted around 2.1 which may reflect med nonadherence - managed as above  3. CAD s/p prior CABG  2004 in [place] - abnormal stress test 02/2023 in [place] showing showed large area of infarction in the inferior wall without reversible ischemia; LHC deferred due to concerns for renal dysfunction, medical therapy recommended - hsTroponins low/flat not felt to reflect ACS - continue ASA - continue atorvastatin if OK with primary team given abnormal LFTs? - continue Imdur  4. Longstanding persistent atrial fib; outside chart also lists atrial flutter - s/p remote ablation, date not clear but referenced in note from 2017 - on sotalol by  prior cardiology team prior to re-establishing with [facility] in early 2024 - recommended to re-establish with EP as outpatient - await anticoag plan per MD - CrCl currently 75ml/min therefore may need dose adjustment of Xarelto vs switch to Eliquis  5. H/o VT, AICD - per EP note this admission, "RV lead with elevated capture threshold -- does not require pacing, there appear to be inappropriate pacing spikes at times on telemetry - pt undersensing atrial fibrillation, sensitivity was adjusted" - will need EP follow-up at discharge; previously referred but do not see thsi happened - outside notes outline " EPS 9/25/201 showing reproducibly inducible nonsustained monomorphic VT up to 10 beat. Reproducibly inducible nonsustained polymorphic VT. Inducible VF with non aggressive triple extra stimuli in the baseline state." - recommend EP OP f/u   Remainder of issues per primary team - anemia - abnormal LFTs with elevated bilirubin/alk phos and low albuin    For questions or updates, please contact [facility] Please consult [email or web address] for contact info under Cardiology/STEMI.  Signed, Laurann Montana, PA-C 09/18/2023, 8:17 AM

## 2023-09-18 NOTE — Plan of Care (Signed)
  Problem: Education: °Goal: Knowledge of General Education information will improve °Description: Including pain rating scale, medication(s)/side effects and non-pharmacologic comfort measures °Outcome: Progressing °  °Problem: Health Behavior/Discharge Planning: °Goal: Ability to manage health-related needs will improve °Outcome: Progressing °  °Problem: Clinical Measurements: °Goal: Ability to maintain clinical measurements within normal limits will improve °Outcome: Progressing °Goal: Will remain free from infection °Outcome: Progressing °Goal: Diagnostic test results will improve °Outcome: Progressing °Goal: Cardiovascular complication will be avoided °Outcome: Progressing °  °Problem: Activity: °Goal: Risk for activity intolerance will decrease °Outcome: Progressing °  °Problem: Nutrition: °Goal: Adequate nutrition will be maintained °Outcome: Progressing °  °Problem: Coping: °Goal: Level of anxiety will decrease °Outcome: Progressing °  °Problem: Elimination: °Goal: Will not experience complications related to bowel motility °Outcome: Progressing °Goal: Will not experience complications related to urinary retention °Outcome: Progressing °  °Problem: Pain Managment: °Goal: General experience of comfort will improve °Outcome: Progressing °  °Problem: Safety: °Goal: Ability to remain free from injury will improve °Outcome: Progressing °  °Problem: Skin Integrity: °Goal: Risk for impaired skin integrity will decrease °Outcome: Progressing °  °Problem: Education: °Goal: Ability to demonstrate management of disease process will improve °Outcome: Progressing °Goal: Ability to verbalize understanding of medication therapies will improve °Outcome: Progressing °Goal: Individualized Educational Video(s) °Outcome: Progressing °  °Problem: Activity: °Goal: Capacity to carry out activities will improve °Outcome: Progressing °  °Problem: Cardiac: °Goal: Ability to achieve and maintain adequate cardiopulmonary perfusion will  improve °Outcome: Progressing °  °

## 2023-09-18 NOTE — Progress Notes (Signed)
   Heart Failure Stewardship Pharmacist Progress Note   PCP: [name], FNP PCP-Cardiologist: Thomasene Ripple, DO    HPI:  67 yo M with PMH of CAD s/p CABG in 2004, CHF, afib, HTN, HLD, CKD, DVT, CVA, and morbid obesity.   Presented to the ED on 9/20 with shortness of breath and L hand swelling. CXR and CT show evidence of pulmonary congestion and moderate R and small L sided pleural effusion. BNP 664. ECHO 9/21 showed LVEF 35-40% (was 30-35% in 02/2023), RWMA, mild LVH, mildly elevated PA pressure, small pericardial effusion, mild MR.   Current HF Medications: Beta Blocker: carvedilol 3.125 mg BID SGLT2i: Jardiance 10 mg daily Other: hydralazine 25 mg TID + Imdur 15 mg daily  Prior to admission HF Medications: Diuretic: furosemide 40 mg BID MWF and 40 mg daily all other days + metolazone 2.5 mg once weekly Beta blocker: carvedilol 3.125 mg BID  Pertinent Lab Values: Serum creatinine 2.53, BUN 27, Potassium 3.0, Sodium 133, Magnesium 1.9, BNP 664.8  Vital Signs: Weight: 250 lbs (admission weight: 289 lbs) Blood pressure: 100/60s  Heart rate: 60-70s  I/O: net -4.5L yesterday; net -19.5L since admission  Medication Assistance / Insurance Benefits Check: Does the patient have prescription insurance?  No Type of insurance plan: only medicare A/B  Does the patient qualify for medication assistance through manufacturers or grants?   Yes Eligible grants and/or patient assistance programs: Jardiance, Xarelto Medication assistance applications in progress: Jardiance, Xarelto Medication assistance applications approved: none Approved medication assistance renewals will be completed by: pending  Outpatient Pharmacy:  Prior to admission outpatient pharmacy: [facility] Is the patient willing to use [facility] TOC pharmacy at discharge? Yes Is the patient willing to transition their outpatient pharmacy to utilize a [facility] outpatient pharmacy?   No    Assessment: 1. Acute on chronic  systolic CHF (LVEF 35-40%), due to ICM. NYHA class III symptoms. - Holding IV lasix with hypokalemia. Remains volume overloaded. Strict I/Os and daily weights. Keep K>4 and Mg>2. KCl 40 mEq x 2 ordered for replacement. Hopeful to restart IV lasix after it is replaced. - Continue carvedilol 3.125 mg BID - No ACE/ARB/ARNI or MRA with CKD - Continue Jardiance 10 mg daily - Continue hydralazine 25 mg TID + Imdur 15 mg daily   Plan: 1) Medication changes recommended at this time: - Restart IV lasix after K replaced  2) Patient assistance: - No prescription insurance found - starting Jardiance and Xarelto patient assistance   3)  Education  - Patient has been educated on current HF medications and potential additions to HF medication regimen - Patient verbalizes understanding that over the next few months, these medication doses may change and more medications may be added to optimize HF regimen - Patient has been educated on basic disease state pathophysiology and goals of therapy   Sharen Hones, PharmD, BCPS Heart Failure Engineer, building services Phone [phone]

## 2023-09-19 ENCOUNTER — Telehealth (HOSPITAL_COMMUNITY): Payer: Self-pay

## 2023-09-19 ENCOUNTER — Other Ambulatory Visit (HOSPITAL_COMMUNITY): Payer: Self-pay

## 2023-09-19 DIAGNOSIS — I5021 Acute systolic (congestive) heart failure: Secondary | ICD-10-CM | POA: Diagnosis not present

## 2023-09-19 DIAGNOSIS — N1832 Chronic kidney disease, stage 3b: Secondary | ICD-10-CM | POA: Diagnosis not present

## 2023-09-19 DIAGNOSIS — I4821 Permanent atrial fibrillation: Secondary | ICD-10-CM | POA: Diagnosis not present

## 2023-09-19 LAB — COMPREHENSIVE METABOLIC PANEL
ALT: 21 U/L (ref 0–44)
AST: 42 U/L — ABNORMAL HIGH (ref 15–41)
Albumin: 2.9 g/dL — ABNORMAL LOW (ref 3.5–5.0)
Alkaline Phosphatase: 183 U/L — ABNORMAL HIGH (ref 38–126)
Anion gap: 11 (ref 5–15)
BUN: 25 mg/dL — ABNORMAL HIGH (ref 8–23)
CO2: 32 mmol/L (ref 22–32)
Calcium: 8.4 mg/dL — ABNORMAL LOW (ref 8.9–10.3)
Chloride: 91 mmol/L — ABNORMAL LOW (ref 98–111)
Creatinine, Ser: 2.39 mg/dL — ABNORMAL HIGH (ref 0.61–1.24)
GFR, Estimated: 29 mL/min — ABNORMAL LOW (ref >60)
Glucose, Bld: 103 mg/dL — ABNORMAL HIGH (ref 70–99)
Potassium: 3.5 mmol/L (ref 3.5–5.1)
Sodium: 134 mmol/L — ABNORMAL LOW (ref 135–145)
Total Bilirubin: 3.1 mg/dL — ABNORMAL HIGH (ref 0.3–1.2)
Total Protein: 6.8 g/dL (ref 6.5–8.1)

## 2023-09-19 LAB — CBC
HCT: 31.7 % — ABNORMAL LOW (ref 39.0–52.0)
Hemoglobin: 10.5 g/dL — ABNORMAL LOW (ref 13.0–17.0)
MCH: 26.3 pg (ref 26.0–34.0)
MCHC: 33.1 g/dL (ref 30.0–36.0)
MCV: 79.4 fL — ABNORMAL LOW (ref 80.0–100.0)
Platelets: 136 10*3/uL — ABNORMAL LOW (ref 150–400)
RBC: 3.99 MIL/uL — ABNORMAL LOW (ref 4.22–5.81)
RDW: 17.8 % — ABNORMAL HIGH (ref 11.5–15.5)
WBC: 3.5 10*3/uL — ABNORMAL LOW (ref 4.0–10.5)
nRBC: 0 % (ref 0.0–0.2)

## 2023-09-19 LAB — MAGNESIUM: Magnesium: 2 mg/dL (ref 1.7–2.4)

## 2023-09-19 LAB — GLUCOSE, CAPILLARY: Glucose-Capillary: 136 mg/dL — ABNORMAL HIGH (ref 70–99)

## 2023-09-19 MED ORDER — POTASSIUM CHLORIDE CRYS ER 20 MEQ PO TBCR
40.0000 meq | EXTENDED_RELEASE_TABLET | Freq: Three times a day (TID) | ORAL | Status: AC
  Administered 2023-09-19 – 2023-09-21 (×4): 40 meq via ORAL
  Filled 2023-09-19 (×5): qty 2

## 2023-09-19 MED ORDER — FUROSEMIDE 10 MG/ML IJ SOLN
120.0000 mg | Freq: Two times a day (BID) | INTRAVENOUS | Status: DC
  Administered 2023-09-19 (×2): 120 mg via INTRAVENOUS
  Filled 2023-09-19: qty 10
  Filled 2023-09-19 (×2): qty 12
  Filled 2023-09-19: qty 10

## 2023-09-19 MED ORDER — METOLAZONE 5 MG PO TABS
5.0000 mg | ORAL_TABLET | Freq: Once | ORAL | Status: AC
  Administered 2023-09-19: 5 mg via ORAL
  Filled 2023-09-19: qty 1

## 2023-09-19 MED ORDER — METOLAZONE 5 MG PO TABS: 5.0000 mg | ORAL_TABLET | Freq: Once | ORAL | Status: DC

## 2023-09-19 NOTE — Progress Notes (Signed)
   Heart Failure Stewardship Pharmacist Progress Note   PCP: [name], FNP PCP-Cardiologist: Thomasene Ripple, DO    HPI:  67 yo M with PMH of CAD s/p CABG in 2004, CHF, afib, HTN, HLD, CKD, DVT, CVA, and morbid obesity.   Presented to the ED on 9/20 with shortness of breath and L hand swelling. CXR and CT show evidence of pulmonary congestion and moderate R and small L sided pleural effusion. BNP 664. ECHO 9/21 showed LVEF 35-40% (was 30-35% in 02/2023), RWMA, mild LVH, mildly elevated PA pressure, small pericardial effusion, mild MR.   Current HF Medications: Diuretic: furosemide 120 mg IV BID Beta Blocker: carvedilol 3.125 mg BID SGLT2i: Jardiance 10 mg daily Other: hydralazine 25 mg TID + Imdur 15 mg daily  Prior to admission HF Medications: Diuretic: furosemide 40 mg BID MWF and 40 mg daily all other days + metolazone 2.5 mg once weekly Beta blocker: carvedilol 3.125 mg BID  Pertinent Lab Values: Serum creatinine 2.47, BUN 26, Potassium 4.2, Sodium 131, Magnesium 1.9, BNP 664.8  Vital Signs: Weight: 249 lbs (admission weight: 289 lbs) Blood pressure: 100/70s  Heart rate: 60-70s  I/O: net -2.6L yesterday; net -20L since admission  Medication Assistance / Insurance Benefits Check: Does the patient have prescription insurance?  No Type of insurance plan: only medicare A/B  Does the patient qualify for medication assistance through manufacturers or grants?   Yes Eligible grants and/or patient assistance programs: Jardiance, Xarelto Medication assistance applications in progress: Jardiance, Xarelto Medication assistance applications approved: none Approved medication assistance renewals will be completed by: pending  Outpatient Pharmacy:  Prior to admission outpatient pharmacy: [facility] Is the patient willing to use [facility] TOC pharmacy at discharge? Yes Is the patient willing to transition their outpatient pharmacy to utilize a [facility] outpatient pharmacy?   No     Assessment: 1. Acute on chronic systolic CHF (LVEF 35-40%), due to ICM. NYHA class III symptoms. - Potassium replaced yesterday, able to restart IV lasix 120 mg BID. Strict I/Os and daily weights. Keep K>4 and Mg>2. KCl 40 mEq TID x 2 days ordered for replacement.  - Continue carvedilol 3.125 mg BID - No ACE/ARB/ARNI or MRA with CKD - Continue Jardiance 10 mg daily - Continue hydralazine 25 mg TID + Imdur 15 mg daily   Plan: 1) Medication changes recommended at this time: - Continue IV lasix, anticipate transition to PO tomorrow  2) Patient assistance: - No prescription insurance found - starting Jardiance and Xarelto patient assistance   3)  Education  - Patient has been educated on current HF medications and potential additions to HF medication regimen - Patient verbalizes understanding that over the next few months, these medication doses may change and more medications may be added to optimize HF regimen - Patient has been educated on basic disease state pathophysiology and goals of therapy   Sharen Hones, PharmD, BCPS Heart Failure Engineer, building services Phone [phone]

## 2023-09-19 NOTE — Plan of Care (Signed)
Patient has wife at bedside. Patient needs to start being more assertive with his care. He has not been walking the hall will recommend PT or mobility for this patient.

## 2023-09-19 NOTE — Discharge Instructions (Signed)

## 2023-09-19 NOTE — Progress Notes (Signed)
Connor Burns.  ZOX:[id number] DOB: 10-Jun-1956 DOA: 09/12/2023 PCP: [name], FNP    Brief Narrative:  67 year old with a history of chronic systolic CHF, ischemic cardiomyopathy status post AICD, HLD, chronic atrial fibrillation, CAD, CVA, CKD stage IIIb, and obesity who presented to the ER 9/20 with progressively worsening shortness of breath over a several week timeframe.  In the ER he was found to be hypoxic with room air saturation 86%.  CXR was consistent with pulmonary edema with bilateral pleural effusions.  Goals of Care:   Code Status: Full Code   DVT prophylaxis: apixaban (ELIQUIS) tablet 5 mg   Interim Hx: No acute events recorded overnight.  Afebrile.  Vital signs stable.  Hypokalemia precluded ongoing diuresis yesterday but has since been corrected.  No new complaints.  Denies shortness of breath or chest pain.  Looking forward to possibly going home tomorrow.  Assessment & Plan:  Acute hypoxic respiratory failure As evidenced by oxygen saturation of 86% on room air and dyspnea at time of presentation - due to pulmonary edema/pleural effusions - resolved with diuresis  Acute exacerbation of chronic ischemic systolic CHF Avoiding ACE inhibitor due to renal insufficiency -TTE this admission notes EF 35-40% which appears consistent with his range since 2019 - remains volume overloaded but is responding to diuresis thus far -felt to be due to inconsistent use of medications at home -net negative approximately 20 L since admission -anticipate transition to oral diuretic 9/28 with discharge home  Ischemic cardiomyopathy Status post AICD -continue carvedilol and rosuvastatin -EP found inappropriate pacing spikes on telemetry with device under sensing atrial fibrillation and therefore sensitivity was adjusted during this admission  CAD Had an abnormal nuclear stress test previously and favored medical management due to renal dysfunction -no acute chest pain -continue medical  therapy  Permanent atrial fibrillation Carvedilol for rate control -Xarelto transition to Eliquis due to CKD with CHA2DS2-VASc score of 4  Stage IIIb CKD Creatinine very mildly elevated with ongoing aggressive diuresis but overall holding steady  Normocytic anemia Likely due to chronic kidney disease - check anemia panel  Obesity - Body mass index is 32.01 kg/m.  History of CVA Continue aspirin and rosuvastatin  Left upper extremity edema Venous duplex negative for DVT -likely also related to CHF exacerbation  Hypokalemia Due to diuresis -continue to supplement and follow -magnesium is normal  Mild dementia of unknown etiology  Has been undergoing outpatient workup via neurology since January 2024 -continue usual Aricept dose  Family Communication:  Disposition: Anticipate discharge home with HH   Objective: Blood pressure 108/71, pulse 68, temperature 97.9 F (36.6 C), temperature source Oral, resp. rate 18, height 6\' 2"  (1.88 m), weight 113.1 kg, SpO2 98%.  Intake/Output Summary (Last 24 hours) at 09/19/2023 1005 Last data filed at 09/19/2023 0900 Gross per 24 hour  Intake 480 ml  Output 950 ml  Net -470 ml   Filed Weights   09/17/23 0604 09/18/23 0240 09/19/23 0330  Weight: 114.1 kg 113.4 kg 113.1 kg    Examination: General: No acute respiratory distress Lungs: Clear to auscultation bilaterally -no wheezing Cardiovascular: Regular rate and rhythm without murmur  Abdomen: Nontender, nondistended, soft, bowel sounds positive, no rebound, no ascites, no appreciable mass Extremities: Only trace edema bilateral lower extremities with 1+ edema bilateral upper extremities today reflecting improvement  CBC: Recent Labs  Lab 09/12/23 1315 09/13/23 0450 09/15/23 0458 09/16/23 0338 09/17/23 0419  WBC 5.8   < > 4.0 4.0 3.5*  NEUTROABS 3.4  --   --   --   --  HGB 11.5*   < > 10.0* 10.7* 10.4*  HCT 34.2*   < > 30.2* 31.9* 30.7*  MCV 81.6   < > 80.3 79.9* 80.8   PLT 179   < > 156 159 153   < > = values in this interval not displayed.   Basic Metabolic Panel: Recent Labs  Lab 09/16/23 0338 09/17/23 0419 09/17/23 1418 09/18/23 0444 09/18/23 1505  NA 133* 135 134* 133* 131*  K 3.6 3.1* 3.4* 3.0* 4.2  CL 93* 93* 89* 90* 89*  CO2 25 30 33* 33* 29  GLUCOSE 97 89 108* 80 110*  BUN 24* 26* 27* 27* 26*  CREATININE 2.28* 2.31* 2.44* 2.53* 2.47*  CALCIUM 8.7* 8.8* 9.0 8.7* 8.3*  MG 1.9 2.0  --  1.9  --    GFR: Estimated Creatinine Clearance: 38.8 mL/min (A) (by C-G formula based on SCr of 2.47 mg/dL (H)).   Scheduled Meds:  apixaban  5 mg Oral BID   atorvastatin  20 mg Oral Daily   carvedilol  3.125 mg Oral BID   cyanocobalamin  1,000 mcg Oral Daily   donepezil  10 mg Oral Daily   empagliflozin  10 mg Oral Daily   hydrALAZINE  25 mg Oral Q8H   isosorbide mononitrate  15 mg Oral Daily   potassium chloride  40 mEq Oral TID     LOS: 7 days   Lonia Blood, MD [facility] Office  [phone] Pager - Text Page per Loretha Stapler  If 7PM-7AM, please contact night-coverage per Amion 09/19/2023, 10:05 AM

## 2023-09-19 NOTE — Progress Notes (Signed)
Cardiology Progress Note  Patient ID: Connor Burns. MRN: [id number] DOB: 1956/01/23 Date of Encounter: 09/19/2023  Primary Cardiologist: Thomasene Ripple, DO  Subjective   Chief Complaint: SOB  HPI: Still volume up.  Potassium precluded diuresis yesterday.  Still has good output.  Anticipate 1 more day of IV diuresis.  ROS:  All other ROS reviewed and negative. Pertinent positives noted in the HPI.     Inpatient Medications  Scheduled Meds:  apixaban  5 mg Oral BID   aspirin EC  81 mg Oral QHS   atorvastatin  20 mg Oral Daily   carvedilol  3.125 mg Oral BID   cyanocobalamin  1,000 mcg Oral Daily   donepezil  10 mg Oral Daily   empagliflozin  10 mg Oral Daily   hydrALAZINE  25 mg Oral Q8H   isosorbide mononitrate  15 mg Oral Daily   potassium chloride  40 mEq Oral TID   Continuous Infusions:  furosemide 120 mg (09/19/23 0858)   PRN Meds: acetaminophen, albuterol, colchicine, ipratropium-albuterol, melatonin, ondansetron **OR** ondansetron (ZOFRAN) IV, mouth rinse   Vital Signs   Vitals:   09/18/23 2114 09/19/23 0000 09/19/23 0330 09/19/23 0400  BP: 112/68  108/71   Pulse: 67  68   Resp:   18   Temp:   97.9 F (36.6 C)   TempSrc:   Oral   SpO2:  99%  98%  Weight:   113.1 kg   Height:        Intake/Output Summary (Last 24 hours) at 09/19/2023 0937 Last data filed at 09/19/2023 0334 Gross per 24 hour  Intake --  Output 1700 ml  Net -1700 ml      09/19/2023    3:30 AM 09/18/2023    2:40 AM 09/17/2023    6:04 AM  Last 3 Weights  Weight (lbs) 249 lb 5.4 oz 250 lb 251 lb 9.6 oz  Weight (kg) 113.1 kg 113.4 kg 114.125 kg      Telemetry  Overnight telemetry shows A-fib 70 to 80 bpm, PVCs, which I personally reviewed.   Physical Exam   Vitals:   09/18/23 2114 09/19/23 0000 09/19/23 0330 09/19/23 0400  BP: 112/68  108/71   Pulse: 67  68   Resp:   18   Temp:   97.9 F (36.6 C)   TempSrc:   Oral   SpO2:  99%  98%  Weight:   113.1 kg   Height:         Intake/Output Summary (Last 24 hours) at 09/19/2023 0937 Last data filed at 09/19/2023 0334 Gross per 24 hour  Intake --  Output 1700 ml  Net -1700 ml       09/19/2023    3:30 AM 09/18/2023    2:40 AM 09/17/2023    6:04 AM  Last 3 Weights  Weight (lbs) 249 lb 5.4 oz 250 lb 251 lb 9.6 oz  Weight (kg) 113.1 kg 113.4 kg 114.125 kg    Body mass index is 32.01 kg/m.  General: Well nourished, well developed, in no acute distress Head: Atraumatic, normal size  Eyes: PEERLA, EOMI  Neck: Supple, JVD 8 to 10 cm of water Endocrine: No thryomegaly Cardiac: Normal S1, S2; irregular rhythm, no murmurs Lungs: Clear to auscultation bilaterally, no wheezing, rhonchi or rales  Abd: Soft, nontender, no hepatomegaly  Ext: 2+ pitting edema Musculoskeletal: No deformities, BUE and BLE strength normal and equal Skin: Warm and dry, no rashes   Neuro: Alert and oriented to person,  place, time, and situation, CNII-XII grossly intact, no focal deficits  Psych: Normal mood and affect   Labs  High Sensitivity Troponin:   Recent Labs  Lab 09/12/23 1315 09/12/23 1542  TROPONINIHS 39* 37*     Cardiac EnzymesNo results for input(s): "TROPONINI" in the last 168 hours. No results for input(s): "TROPIPOC" in the last 168 hours.  Chemistry Recent Labs  Lab 09/15/23 0458 09/16/23 0338 09/17/23 0419 09/17/23 1418 09/18/23 0444 09/18/23 1505  NA 133* 133* 135 134* 133* 131*  K 3.2* 3.6 3.1* 3.4* 3.0* 4.2  CL 95* 93* 93* 89* 90* 89*  CO2 28 25 30  33* 33* 29  GLUCOSE 132* 97 89 108* 80 110*  BUN 23 24* 26* 27* 27* 26*  CREATININE 2.26* 2.28* 2.31* 2.44* 2.53* 2.47*  CALCIUM 8.4* 8.7* 8.8* 9.0 8.7* 8.3*  PROT 6.4* 7.2 6.8  --   --   --   ALBUMIN 2.7* 3.0* 2.8*  --   --   --   AST 31 35 36  --   --   --   ALT 19 23 21   --   --   --   ALKPHOS 174* 190* 183*  --   --   --   BILITOT 3.8* 4.0* 3.5*  --   --   --   GFRNONAA 31* 31* 30* 28* 27* 28*  ANIONGAP 10 15 12 12 10 13     Hematology Recent Labs   Lab 09/15/23 0458 09/16/23 0338 09/17/23 0419  WBC 4.0 4.0 3.5*  RBC 3.76* 3.99* 3.80*  HGB 10.0* 10.7* 10.4*  HCT 30.2* 31.9* 30.7*  MCV 80.3 79.9* 80.8  MCH 26.6 26.8 27.4  MCHC 33.1 33.5 33.9  RDW 18.7* 18.4* 18.2*  PLT 156 159 153   BNP Recent Labs  Lab 09/12/23 1315  BNP 664.8*    DDimer No results for input(s): "DDIMER" in the last 168 hours.   Radiology  No results found.  Cardiac Studies  TTE 09/13/2023  1. Left ventricular ejection fraction, by estimation, is 35 to 40%. The  left ventricle has moderately decreased function. The left ventricle  demonstrates regional wall motion abnormalities (see scoring  diagram/findings for description). There is mild  left ventricular hypertrophy. Left ventricular diastolic parameters are  indeterminate.   2. No formed LV mural thrombus noted with Definity contrast.   3. Right ventricular systolic function is mildly reduced. The right  ventricular size is not well visualized. There is mildly elevated  pulmonary artery systolic pressure. The estimated right ventricular  systolic pressure is 40.2 mmHg.   4. A small pericardial effusion is present. The pericardial effusion is  posterior to the left ventricle.   5. The mitral valve is degenerative. Mild mitral valve regurgitation.   6. The aortic valve is tricuspid. There is mild calcification of the  aortic valve. Aortic valve regurgitation is not visualized. Aortic valve  sclerosis is present, with no evidence of aortic valve stenosis.   7. Aortic dilatation noted. There is borderline dilatation of the  ascending aorta, measuring 37 mm.   8. The inferior vena cava is dilated in size with <50% respiratory  variability, suggesting right atrial pressure of 15 mmHg.   Patient Profile  67 year old male with history of CAD status post CABG, systolic heart failure/ischemic cardiomyopathy, CKD stage IIIb, obesity, CVA who was admitted on 09/12/2023 for acute on chronic systolic  heart failure.   Assessment & Plan   # Acute on chronic systolic heart failure,  EF 35 to 40% # Ischemic cardiomyopathy # Hypokalemia # CKD stage IIIb -Net -1.7 L since yesterday.  Could not get diuresis due to low potassium.  Potassium has now normalized.  Would recommend to continue with Lasix 120 mg IV twice daily.  I think he needs 1 more day of IV diuresis with good urine output and likely transition to oral diuretics tomorrow. -Renal function is somewhat stable. -Continue carvedilol, hydralazine, Imdur, Jardiance. -We will go ahead and arrange outpatient follow-up.  Anticipate discharge likely tomorrow.  # CAD s/p CABG -statin.  Stop aspirin.  He is on DOAC secondary  # Persistent atrial fibrillation -Given tenuous kidney function transition to Eliquis.  Likely a better agent than Xarelto. -would benefit from outpatient restoration of NSR.       For questions or updates, please contact [facility] Please consult [email or web address] for contact info under        Signed, Gerri Spore T. Flora Lipps, MD, [facility] [place]  [facility]  09/19/2023 9:37 AM

## 2023-09-19 NOTE — Telephone Encounter (Signed)
Heart Failure Patient Advocate Encounter  Medication assistance forms for Jardiance, Eliquis have been started. Patient has signed forms.  Provider information will need to be completed before submitting.  Forms have been attached to patient chart under 'Media' tab. Routing to appropriate office.  Burnell Blanks, CPhT Rx Patient Advocate Phone: [phone]

## 2023-09-19 NOTE — Progress Notes (Signed)
HF TOC team arranged f/u 10/9 Have arranged gen cards f/u 10/16 and put appt info on AVS Also has pre-existing f/u scheduled 10/28 with EP Dr. Elberta Fortis, will not change

## 2023-09-20 DIAGNOSIS — N1832 Chronic kidney disease, stage 3b: Secondary | ICD-10-CM | POA: Diagnosis not present

## 2023-09-20 DIAGNOSIS — I4821 Permanent atrial fibrillation: Secondary | ICD-10-CM | POA: Diagnosis not present

## 2023-09-20 DIAGNOSIS — I255 Ischemic cardiomyopathy: Secondary | ICD-10-CM

## 2023-09-20 DIAGNOSIS — I4819 Other persistent atrial fibrillation: Secondary | ICD-10-CM

## 2023-09-20 DIAGNOSIS — I5023 Acute on chronic systolic (congestive) heart failure: Secondary | ICD-10-CM | POA: Diagnosis not present

## 2023-09-20 DIAGNOSIS — I5021 Acute systolic (congestive) heart failure: Secondary | ICD-10-CM | POA: Diagnosis not present

## 2023-09-20 LAB — CBC
HCT: 31.6 % — ABNORMAL LOW (ref 39.0–52.0)
Hemoglobin: 10.8 g/dL — ABNORMAL LOW (ref 13.0–17.0)
MCH: 27.1 pg (ref 26.0–34.0)
MCHC: 34.2 g/dL (ref 30.0–36.0)
MCV: 79.2 fL — ABNORMAL LOW (ref 80.0–100.0)
Platelets: 148 10*3/uL — ABNORMAL LOW (ref 150–400)
RBC: 3.99 MIL/uL — ABNORMAL LOW (ref 4.22–5.81)
RDW: 17.8 % — ABNORMAL HIGH (ref 11.5–15.5)
WBC: 3.6 10*3/uL — ABNORMAL LOW (ref 4.0–10.5)
nRBC: 0 % (ref 0.0–0.2)

## 2023-09-20 LAB — BASIC METABOLIC PANEL
Anion gap: 10 (ref 5–15)
BUN: 25 mg/dL — ABNORMAL HIGH (ref 8–23)
CO2: 33 mmol/L — ABNORMAL HIGH (ref 22–32)
Calcium: 8.7 mg/dL — ABNORMAL LOW (ref 8.9–10.3)
Chloride: 89 mmol/L — ABNORMAL LOW (ref 98–111)
Creatinine, Ser: 2.43 mg/dL — ABNORMAL HIGH (ref 0.61–1.24)
GFR, Estimated: 28 mL/min — ABNORMAL LOW (ref >60)
Glucose, Bld: 98 mg/dL (ref 70–99)
Potassium: 2.9 mmol/L — ABNORMAL LOW (ref 3.5–5.1)
Sodium: 132 mmol/L — ABNORMAL LOW (ref 135–145)

## 2023-09-20 LAB — RETICULOCYTES
Immature Retic Fract: 6.1 % (ref 2.3–15.9)
RBC.: 4 MIL/uL — ABNORMAL LOW (ref 4.22–5.81)
Retic Count, Absolute: 32 10*3/uL (ref 19.0–186.0)
Retic Ct Pct: 0.8 % (ref 0.4–3.1)

## 2023-09-20 LAB — VITAMIN B12: Vitamin B-12: 964 pg/mL — ABNORMAL HIGH (ref 180–914)

## 2023-09-20 LAB — IRON AND TIBC
Iron: 59 ug/dL (ref 45–182)
Saturation Ratios: 13 % — ABNORMAL LOW (ref 17.9–39.5)
TIBC: 472 ug/dL — ABNORMAL HIGH (ref 250–450)
UIBC: 413 ug/dL

## 2023-09-20 LAB — FOLATE: Folate: 12.2 ng/mL (ref >5.9)

## 2023-09-20 LAB — FERRITIN: Ferritin: 49 ng/mL (ref 24–336)

## 2023-09-20 LAB — MAGNESIUM: Magnesium: 2 mg/dL (ref 1.7–2.4)

## 2023-09-20 MED ORDER — FUROSEMIDE 40 MG PO TABS
40.0000 mg | ORAL_TABLET | Freq: Two times a day (BID) | ORAL | Status: DC
  Administered 2023-09-21 – 2023-09-22 (×3): 40 mg via ORAL
  Filled 2023-09-20 (×3): qty 1

## 2023-09-20 MED ORDER — POTASSIUM CHLORIDE 10 MEQ/100ML IV SOLN
10.0000 meq | INTRAVENOUS | Status: AC
  Administered 2023-09-20 (×4): 10 meq via INTRAVENOUS
  Filled 2023-09-20 (×4): qty 100

## 2023-09-20 MED ORDER — FUROSEMIDE 40 MG PO TABS
40.0000 mg | ORAL_TABLET | Freq: Two times a day (BID) | ORAL | Status: DC
  Administered 2023-09-20: 40 mg via ORAL
  Filled 2023-09-20: qty 1
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
## 2023-09-20 NOTE — Progress Notes (Signed)
Connor Burns.  DGL:[id number] DOB: September 17, 1956 DOA: 09/12/2023 PCP: [name], FNP    Brief Narrative:  67 year old with a history of chronic systolic CHF, ischemic cardiomyopathy status post AICD, HLD, chronic atrial fibrillation, CAD, CVA, CKD stage IIIb, and obesity who presented to the ER 9/20 with progressively worsening shortness of breath over a several week timeframe.  In the ER he was found to be hypoxic with room air saturation 86%.  CXR was consistent with pulmonary edema with bilateral pleural effusions.  Goals of Care:   Code Status: Full Code   DVT prophylaxis: apixaban (ELIQUIS) tablet 5 mg   Interim Hx: No acute events recorded overnight.  Potassium low again this morning at 2.9.  Afebrile with vital signs otherwise stable. Resting comfortably in bed. No new complaints. Denies cp or sob. Has refused to allow for a repeat blood check this afternoon.   Outpatient F/U issues: -Will need an outpatient GI evaluation to include screening colonoscopy after proven stable from CHF standpoint  Assessment & Plan:  Acute hypoxic respiratory failure As evidenced by oxygen saturation of 86% on room air and dyspnea at time of presentation - due to pulmonary edema/pleural effusions - resolved with diuresis  Acute exacerbation of chronic ischemic systolic CHF Avoiding ACE inhibitor due to renal insufficiency -TTE this admission notes EF 35-40% which appears consistent with his range since 2019 - has responded well to diuresis thus far -felt to be due to inconsistent use of medications at home -net negative approximately 25 L since admission - transitioned to oral diuretic 9/28   Filed Weights   09/18/23 0240 09/19/23 0330 09/20/23 0625  Weight: 113.4 kg 113.1 kg 112.9 kg    Recurrent refractory hypokalemia Due to diuresis -appropriate care being challenged by patient's refusal to allow afternoon blood draws - continue to supplement and follow up in a.m. - magnesium is  normal  Ischemic cardiomyopathy Status post AICD -continue carvedilol and rosuvastatin -EP found inappropriate pacing spikes on telemetry with device under sensing atrial fibrillation and therefore sensitivity was adjusted during this admission  CAD Had an abnormal nuclear stress test previously and favored medical management due to renal dysfunction -no acute chest pain -continue medical therapy  Permanent atrial fibrillation Carvedilol for rate control -Xarelto transitioned to Eliquis due to CKD with CHA2DS2-VASc score of 4  Stage IIIb CKD Creatinine very mildly elevated with ongoing aggressive diuresis but overall holding steady  Normocytic anemia Likely due to chronic kidney disease - anemia panel inconclusive but suggestive of possible developing iron deficiency -needs outpatient GI evaluation  Obesity - Body mass index is 31.97 kg/m.  History of CVA Continue aspirin and rosuvastatin  Left upper extremity edema Venous duplex negative for DVT -likely also related to CHF exacerbation -has improved markedly with diuresis  Mild dementia of unknown etiology  Has been undergoing outpatient workup via neurology since January 2024 -continue usual Aricept dose  Family Communication: Spoke with wife at bedside Disposition: Anticipate discharge home with [facility] as soon as potassium stabilizes   Objective: Blood pressure 110/63, pulse 62, temperature 98.1 F (36.7 C), temperature source Oral, resp. rate 20, height 6\' 2"  (1.88 m), weight 112.9 kg, SpO2 97%.  Intake/Output Summary (Last 24 hours) at 09/20/2023 1015 Last data filed at 09/20/2023 0635 Gross per 24 hour  Intake 1204 ml  Output 5300 ml  Net -4096 ml   Filed Weights   09/18/23 0240 09/19/23 0330 09/20/23 0625  Weight: 113.4 kg 113.1 kg 112.9 kg  Examination: General: No acute respiratory distress Lungs: Clear to auscultation bilaterally Cardiovascular: Regular rate and rhythm without murmur  Abdomen: Nontender,  nondistended, soft, bowel sounds positive Extremities: Only trace edema bilateral lower extremities with 1+ edema bilateral upper extremities   CBC: Recent Labs  Lab 09/17/23 0419 09/19/23 1623 09/20/23 0417  WBC 3.5* 3.5* 3.6*  HGB 10.4* 10.5* 10.8*  HCT 30.7* 31.7* 31.6*  MCV 80.8 79.4* 79.2*  PLT 153 136* 148*   Basic Metabolic Panel: Recent Labs  Lab 09/18/23 0444 09/18/23 1505 09/19/23 1623 09/20/23 0417  NA 133* 131* 134* 132*  K 3.0* 4.2 3.5 2.9*  CL 90* 89* 91* 89*  CO2 33* 29 32 33*  GLUCOSE 80 110* 103* 98  BUN 27* 26* 25* 25*  CREATININE 2.53* 2.47* 2.39* 2.43*  CALCIUM 8.7* 8.3* 8.4* 8.7*  MG 1.9  --  2.0 2.0   GFR: Estimated Creatinine Clearance: 39.4 mL/min (A) (by C-G formula based on SCr of 2.43 mg/dL (H)).   Scheduled Meds:  apixaban  5 mg Oral BID   atorvastatin  20 mg Oral Daily   carvedilol  3.125 mg Oral BID   cyanocobalamin  1,000 mcg Oral Daily   donepezil  10 mg Oral Daily   empagliflozin  10 mg Oral Daily   furosemide  40 mg Oral BID   hydrALAZINE  25 mg Oral Q8H   isosorbide mononitrate  15 mg Oral Daily   potassium chloride  40 mEq Oral TID     LOS: 8 days   Lonia Blood, MD [facility] Office  [phone] Pager - Text Page per Loretha Stapler  If 7PM-7AM, please contact night-coverage per Amion 09/20/2023, 10:15 AM

## 2023-09-20 NOTE — Progress Notes (Signed)
Cardiology Progress Note  Patient ID: Connor Burns. MRN: [id number] DOB: [birth year] Date of Encounter: 09/20/2023  Primary Cardiologist: Thomasene Ripple, DO  Subjective   Chief Complaint: SOB  HPI:  Feels well today, able to lie flat comfortable.   Renal fxn stable Weight 289->249 Total out net negative 24L   ROS:  All other ROS reviewed and negative. Pertinent positives noted in the HPI.     Inpatient Medications  Scheduled Meds:  apixaban  5 mg Oral BID   atorvastatin  20 mg Oral Daily   carvedilol  3.125 mg Oral BID   cyanocobalamin  1,000 mcg Oral Daily   donepezil  10 mg Oral Daily   empagliflozin  10 mg Oral Daily   hydrALAZINE  25 mg Oral Q8H   isosorbide mononitrate  15 mg Oral Daily   potassium chloride  40 mEq Oral TID   Continuous Infusions:  furosemide Stopped (09/19/23 2003)   PRN Meds: acetaminophen, albuterol, colchicine, ipratropium-albuterol, melatonin, ondansetron **OR** ondansetron (ZOFRAN) IV, mouth rinse   Vital Signs   Vitals:   09/19/23 1934 09/20/23 0432 09/20/23 0625 09/20/23 0752  BP: 101/67 110/63    Pulse: 71 62    Resp: 18 20  20   Temp: 97.6 F (36.4 C) 98.9 F (37.2 C)  98.1 F (36.7 C)  TempSrc: Oral Oral  Oral  SpO2: 97% 97%    Weight:   112.9 kg   Height:        Intake/Output Summary (Last 24 hours) at 09/20/2023 0839 Last data filed at 09/20/2023 7829 Gross per 24 hour  Intake 1684 ml  Output 5300 ml  Net -3616 ml      09/20/2023    6:25 AM 09/19/2023    3:30 AM 09/18/2023    2:40 AM  Last 3 Weights  Weight (lbs) 249 lb 249 lb 5.4 oz 250 lb  Weight (kg) 112.946 kg 113.1 kg 113.4 kg      Telemetry  Overnight telemetry shows A-fib 70 to 80 bpm, PVCs, which I personally reviewed.   Physical Exam   Vitals:   09/19/23 1934 09/20/23 0432 09/20/23 0625 09/20/23 0752  BP: 101/67 110/63    Pulse: 71 62    Resp: 18 20  20   Temp: 97.6 F (36.4 C) 98.9 F (37.2 C)  98.1 F (36.7 C)  TempSrc: Oral Oral  Oral  SpO2:  97% 97%    Weight:   112.9 kg   Height:        Intake/Output Summary (Last 24 hours) at 09/20/2023 0839 Last data filed at 09/20/2023 5621 Gross per 24 hour  Intake 1684 ml  Output 5300 ml  Net -3616 ml       09/20/2023    6:25 AM 09/19/2023    3:30 AM 09/18/2023    2:40 AM  Last 3 Weights  Weight (lbs) 249 lb 249 lb 5.4 oz 250 lb  Weight (kg) 112.946 kg 113.1 kg 113.4 kg    Body mass index is 31.97 kg/m.  General: Well nourished, well developed, in no acute distress Head: Atraumatic, normal size  Eyes: PEERLA, EOMI  Neck: Supple, JVD 8 to 10 cm of water Endocrine: No thryomegaly Cardiac: Normal S1, S2; irregular rhythm, no murmurs Lungs: Clear to auscultation bilaterally, no wheezing, rhonchi or rales  Abd: Soft, nontender, no hepatomegaly  Ext: 2+ pitting edema Musculoskeletal: No deformities, BUE and BLE strength normal and equal Skin: Warm and dry, no rashes   Neuro: Alert and oriented to  person, place, time, and situation, CNII-XII grossly intact, no focal deficits  Psych: Normal mood and affect   Labs  High Sensitivity Troponin:   Recent Labs  Lab 09/12/23 1315 09/12/23 1542  TROPONINIHS 39* 37*     Cardiac EnzymesNo results for input(s): "TROPONINI" in the last 168 hours. No results for input(s): "TROPIPOC" in the last 168 hours.  Chemistry Recent Labs  Lab 09/16/23 0338 09/17/23 0419 09/17/23 1418 09/18/23 1505 09/19/23 1623 09/20/23 0417  NA 133* 135   < > 131* 134* 132*  K 3.6 3.1*   < > 4.2 3.5 2.9*  CL 93* 93*   < > 89* 91* 89*  CO2 25 30   < > 29 32 33*  GLUCOSE 97 89   < > 110* 103* 98  BUN 24* 26*   < > 26* 25* 25*  CREATININE 2.28* 2.31*   < > 2.47* 2.39* 2.43*  CALCIUM 8.7* 8.8*   < > 8.3* 8.4* 8.7*  PROT 7.2 6.8  --   --  6.8  --   ALBUMIN 3.0* 2.8*  --   --  2.9*  --   AST 35 36  --   --  42*  --   ALT 23 21  --   --  21  --   ALKPHOS 190* 183*  --   --  183*  --   BILITOT 4.0* 3.5*  --   --  3.1*  --   GFRNONAA 31* 30*   < > 28* 29*  28*  ANIONGAP 15 12   < > 13 11 10    < > = values in this interval not displayed.    Hematology Recent Labs  Lab 09/17/23 0419 09/19/23 1623 09/20/23 0417  WBC 3.5* 3.5* 3.6*  RBC 3.80* 3.99* 3.99*  4.00*  HGB 10.4* 10.5* 10.8*  HCT 30.7* 31.7* 31.6*  MCV 80.8 79.4* 79.2*  MCH 27.4 26.3 27.1  MCHC 33.9 33.1 34.2  RDW 18.2* 17.8* 17.8*  PLT 153 136* 148*   BNP No results for input(s): "BNP", "PROBNP" in the last 168 hours.   DDimer No results for input(s): "DDIMER" in the last 168 hours.   Radiology  No results found.  Cardiac Studies  TTE 09/13/2023  1. Left ventricular ejection fraction, by estimation, is 35 to 40%. The  left ventricle has moderately decreased function. The left ventricle  demonstrates regional wall motion abnormalities (see scoring  diagram/findings for description). There is mild  left ventricular hypertrophy. Left ventricular diastolic parameters are  indeterminate.   2. No formed LV mural thrombus noted with Definity contrast.   3. Right ventricular systolic function is mildly reduced. The right  ventricular size is not well visualized. There is mildly elevated  pulmonary artery systolic pressure. The estimated right ventricular  systolic pressure is 40.2 mmHg.   4. A small pericardial effusion is present. The pericardial effusion is  posterior to the left ventricle.   5. The mitral valve is degenerative. Mild mitral valve regurgitation.   6. The aortic valve is tricuspid. There is mild calcification of the  aortic valve. Aortic valve regurgitation is not visualized. Aortic valve  sclerosis is present, with no evidence of aortic valve stenosis.   7. Aortic dilatation noted. There is borderline dilatation of the  ascending aorta, measuring 37 mm.   8. The inferior vena cava is dilated in size with <50% respiratory  variability, suggesting right atrial pressure of 15 mmHg.   Patient Profile  67 year old male with history of CAD status post  CABG, systolic heart failure/ischemic cardiomyopathy, CKD stage IIIb, obesity, CVA who was admitted on 09/12/2023 for acute on chronic systolic heart failure.   Assessment & Plan   # Acute on chronic systolic heart failure, EF 35 to 40% # Ischemic cardiomyopathy # Hypokalemia # CKD stage IIIb -s/p IV Lasix 120 mg IV twice daily. will  transition to oral diuretics today. Lasix 40 mg BID. Can check BMET on dispo -Renal function is somewhat stable. -Continue carvedilol, hydralazine, Imdur, Jardiance. -We will go ahead and arrange outpatient follow-up.  Anticipate discharge likely tomorrow.  # CAD s/p CABG -statin.  Stop aspirin.  He is on DOAC secondary  # Persistent atrial fibrillation -remains rate controlled -Given tenuous kidney function transition to Eliquis.  Likely a better agent than Xarelto. -would benefit from outpatient restoration of NSR.     From a cardiology standpoint, he is on oral medication and has improved symptomatically. Weight is down significantly. He can be discharged from an HF standpoint. He has FU  For questions or updates, please contact [facility] Please consult [email or web address] for contact info under        Maisie Fus

## 2023-09-20 NOTE — Plan of Care (Signed)
Will continue to monitor pt

## 2023-09-21 DIAGNOSIS — I5021 Acute systolic (congestive) heart failure: Secondary | ICD-10-CM | POA: Diagnosis not present

## 2023-09-21 DIAGNOSIS — I4821 Permanent atrial fibrillation: Secondary | ICD-10-CM | POA: Diagnosis not present

## 2023-09-21 DIAGNOSIS — N1832 Chronic kidney disease, stage 3b: Secondary | ICD-10-CM | POA: Diagnosis not present

## 2023-09-21 DIAGNOSIS — J9601 Acute respiratory failure with hypoxia: Secondary | ICD-10-CM | POA: Diagnosis not present

## 2023-09-21 LAB — BASIC METABOLIC PANEL
Anion gap: 12 (ref 5–15)
BUN: 28 mg/dL — ABNORMAL HIGH (ref 8–23)
CO2: 32 mmol/L (ref 22–32)
Calcium: 8.7 mg/dL — ABNORMAL LOW (ref 8.9–10.3)
Chloride: 90 mmol/L — ABNORMAL LOW (ref 98–111)
Creatinine, Ser: 2.46 mg/dL — ABNORMAL HIGH (ref 0.61–1.24)
GFR, Estimated: 28 mL/min — ABNORMAL LOW (ref >60)
Glucose, Bld: 88 mg/dL (ref 70–99)
Potassium: 3.4 mmol/L — ABNORMAL LOW (ref 3.5–5.1)
Sodium: 134 mmol/L — ABNORMAL LOW (ref 135–145)

## 2023-09-21 MED ORDER — CARMEX CLASSIC LIP BALM EX OINT
TOPICAL_OINTMENT | CUTANEOUS | Status: DC | PRN
  Filled 2023-09-21: qty 10

## 2023-09-21 NOTE — Plan of Care (Signed)
?  Problem: Clinical Measurements: ?Goal: Ability to maintain clinical measurements within normal limits will improve ?Outcome: Progressing ?Goal: Cardiovascular complication will be avoided ?Outcome: Progressing ?  ?Problem: Activity: ?Goal: Risk for activity intolerance will decrease ?Outcome: Progressing ?  ?Problem: Nutrition: ?Goal: Adequate nutrition will be maintained ?Outcome: Progressing ?  ?Problem: Safety: ?Goal: Ability to remain free from injury will improve ?Outcome: Progressing ?  ?Problem: Skin Integrity: ?Goal: Risk for impaired skin integrity will decrease ?Outcome: Progressing ?  ?

## 2023-09-21 NOTE — Progress Notes (Signed)
Connor Burns.  ZOX:[id number] DOB: February 27, 1956 DOA: 09/12/2023 PCP: [name], FNP    Brief Narrative:  67 year old with a history of chronic systolic CHF, ischemic cardiomyopathy status post AICD, HLD, chronic atrial fibrillation, CAD, CVA, CKD stage IIIb, and obesity who presented to the ER 9/20 with progressively worsening shortness of breath over a several week timeframe.  In the ER he was found to be hypoxic with room air saturation 86%.  CXR was consistent with pulmonary edema with bilateral pleural effusions.  Goals of Care:   Code Status: Full Code   DVT prophylaxis: apixaban (ELIQUIS) tablet 5 mg   Interim Hx: Potassium has improved.  No new complaints today.  No acute events recorded overnight.  Vital signs stable.  Afebrile.  Assessment & Plan:  Acute hypoxic respiratory failure As evidenced by oxygen saturation of 86% on room air and dyspnea at time of presentation - due to pulmonary edema/pleural effusions - resolved with diuresis  Acute exacerbation of chronic ischemic systolic CHF Avoiding ACE inhibitor due to renal insufficiency -TTE this admission notes EF 35-40% which appears consistent with his range since 2019 - has responded well to diuresis thus far -felt to be due to inconsistent use of medications at home -net negative approximately 25 L since admission - transitioned to oral diuretic 9/28 -plan for discharge home tomorrow when medications can be procured  [facility] Weights   09/19/23 0330 09/20/23 0625 09/21/23 0648  Weight: 113.1 kg 112.9 kg 103.3 kg    Recurrent refractory hypokalemia Due to diuresis -appropriate care being challenged by patient's refusal to allow afternoon blood draws - continue to supplement and follow up in a.m.  Ischemic cardiomyopathy Status post AICD -continue carvedilol and rosuvastatin -EP found inappropriate pacing spikes on telemetry with device under sensing atrial fibrillation and therefore sensitivity was adjusted during this  admission  CAD Had an abnormal nuclear stress test previously and favored medical management due to renal dysfunction -no acute chest pain -continue medical therapy  Permanent atrial fibrillation Carvedilol for rate control -Xarelto transitioned to Eliquis due to CKD with CHA2DS2-VASc score of 4  Stage IIIb CKD Creatinine very mildly elevated with ongoing aggressive diuresis but overall holding steady  Normocytic anemia Likely due to chronic kidney disease - anemia panel inconclusive but suggestive of possible developing iron deficiency -needs outpatient GI evaluation  Obesity - Body mass index is 29.25 kg/m.  History of CVA Continue aspirin and rosuvastatin  Left upper extremity edema Venous duplex negative for DVT -likely also related to CHF exacerbation -has improved markedly with diuresis  Mild dementia of unknown etiology  Has been undergoing outpatient workup via neurology since January 2024 -continue usual Aricept dose  Family Communication: Spoke with wife at bedside Disposition: Anticipate discharge home 9/30 when medications can be procured   Objective: Blood pressure 104/64, pulse 73, temperature 97.6 F (36.4 C), temperature source Oral, resp. rate 17, height 6\' 2"  (1.88 m), weight 103.3 kg, SpO2 99%.  Intake/Output Summary (Last 24 hours) at 09/21/2023 1428 Last data filed at 09/21/2023 1000 Gross per 24 hour  Intake 440 ml  Output 850 ml  Net -410 ml   Filed Weights   09/19/23 0330 09/20/23 0625 09/21/23 0648  Weight: 113.1 kg 112.9 kg 103.3 kg    Examination: General: No acute respiratory distress Lungs: Clear to auscultation bilaterally Cardiovascular: Regular rate and rhythm without murmur  Abdomen: Nontender, nondistended, soft, bowel sounds positive Extremities: Only trace edema bilateral lower extremities which has dramatically improved over the  last few days  CBC: Recent Labs  Lab 09/17/23 0419 09/19/23 1623 09/20/23 0417  WBC 3.5* 3.5*  3.6*  HGB 10.4* 10.5* 10.8*  HCT 30.7* 31.7* 31.6*  MCV 80.8 79.4* 79.2*  PLT 153 136* 148*   Basic Metabolic Panel: Recent Labs  Lab 09/18/23 0444 09/18/23 1505 09/19/23 1623 09/20/23 0417 09/21/23 0341  NA 133*   < > 134* 132* 134*  K 3.0*   < > 3.5 2.9* 3.4*  CL 90*   < > 91* 89* 90*  CO2 33*   < > 32 33* 32  GLUCOSE 80   < > 103* 98 88  BUN 27*   < > 25* 25* 28*  CREATININE 2.53*   < > 2.39* 2.43* 2.46*  CALCIUM 8.7*   < > 8.4* 8.7* 8.7*  MG 1.9  --  2.0 2.0  --    < > = values in this interval not displayed.   GFR: Estimated Creatinine Clearance: 37.3 mL/min (A) (by C-G formula based on SCr of 2.46 mg/dL (H)).   Scheduled Meds:  apixaban  5 mg Oral BID   atorvastatin  20 mg Oral Daily   carvedilol  3.125 mg Oral BID   cyanocobalamin  1,000 mcg Oral Daily   donepezil  10 mg Oral Daily   empagliflozin  10 mg Oral Daily   furosemide  40 mg Oral BID   isosorbide mononitrate  15 mg Oral Daily   potassium chloride  40 mEq Oral TID     LOS: 9 days   Lonia Blood, MD [facility] Office  [phone] Pager - Text Page per Loretha Stapler  If 7PM-7AM, please contact night-coverage per Amion 09/21/2023, 2:28 PM

## 2023-09-21 NOTE — Discharge Summary (Signed)
DISCHARGE SUMMARY  Connor Burns.  MR#: [id number]  DOB:September 20, 1956  Date of Admission: 09/12/2023 Date of Discharge: 09/22/2023  Attending Physician:Taylah Dubiel Silvestre Gunner, MD  Patient's ONG:EXBMW, Connor Cram, FNP  Consults: Cardiology   Disposition: D/C home   Follow-up Appts:  Follow-up Information     [facility]. Go in 15 day(s).   Specialty: Cardiology Why: Hospital follow up 10/01/2023 @ 3 pm PLEASE bring a current medication list to appointment FREE valet parking, Entrance C, off [employer] information: [address] [place] [address] [phone]        Jobe Gibbon, [facility] Follow up.   Specialty: Home Health Services Why: Physical Therapy and Registered Nurse-office to call with visit times. Contact information: [address] [place] [address] [phone]         Cannon Kettle, PA-C Follow up.   Specialty: Internal Medicine Why: [name] - [facility] location - general cardiology follow-up on Wednesday Oct 08, 2023 at 10:05 AM. Arrive 15 minutes prior to appointment to check in. Victorino Dike is one of our nurse practitioners with Dr. Servando Salina. Contact information: [address] [place] [address] [phone]                 Issues for Follow-up: -will need an outpatient GI evaluation to include screening colonoscopy after proven stable from CHF standpoint  -recheck of K+ and creatinine will be indicated in 3-5 days   Discharge Diagnoses: Acute hypoxic respiratory failure Acute exacerbation of chronic ischemic systolic CHF Recurrent refractory hypokalemia Ischemic cardiomyopathy CAD Permanent atrial fibrillation Stage IIIb CKD Normocytic anemia Obesity - Body mass index is 31.97 kg/m. History of CVA Left upper extremity edema Mild dementia of unknown etiology   Initial presentation: 67 year old with a  history of chronic systolic CHF, ischemic cardiomyopathy status post AICD, HLD, chronic atrial fibrillation, CAD, CVA, CKD stage IIIb, and obesity who presented to the ER 9/20 with progressively worsening shortness of breath over a several week timeframe. In the ER he was found to be hypoxic with room air saturation 86%. CXR was consistent with pulmonary edema with bilateral pleural effusions.   Hospital Course:  Acute hypoxic respiratory failure As evidenced by oxygen saturation of 86% on room air and dyspnea at time of presentation - due to pulmonary edema/pleural effusions - resolved with diuresis   Acute exacerbation of chronic ischemic systolic CHF Avoiding ACE inhibitor due to renal insufficiency -TTE this admission notes EF 35-40% which appears consistent with his range since 2019 - has responded well to diuresis - felt to be due to inconsistent use of medications at home - net negative approximately 26 L since admission - transitioned to oral diuretic 9/28 - counseled patient and family on need for strict compliance with medications at home, as well as CHF diet and daily weight monitoring   Recurrent refractory hypokalemia Due to diuresis -appropriate care was challenged by patient's intermittent refusal to allow blood draws - continue to supplement and follow up as outpatient. - magnesium is normal   Ischemic cardiomyopathy Status post AICD - continue carvedilol and rosuvastatin - EP found inappropriate pacing spikes on telemetry with device under sensing atrial fibrillation and therefore sensitivity was adjusted during this admission   CAD Had an abnormal nuclear stress test previously and favored medical management due to renal dysfunction - no acute chest pain - continue medical therapy   Permanent atrial fibrillation Carvedilol for  rate control - Xarelto transitioned to Eliquis due to CKD with CHA2DS2-VASc score of 4   Stage IIIb CKD Creatinine very mildly elevated with ongoing  aggressive diuresis but overall holding steady at 2.44 at time of d/c    Normocytic anemia Likely due to chronic kidney disease - anemia panel inconclusive but suggestive of possible developing iron deficiency -needs outpatient GI evaluation for screening colonoscopy if this has not been accomplished recently - this was discussed w/ the patient and his family    Obesity - Body mass index is 31.97 kg/m.   History of CVA Continue aspirin and rosuvastatin   Left upper extremity edema Venous duplex negative for DVT -likely also related to CHF exacerbation -has improved markedly with diuresis   Mild dementia of unknown etiology  Has been undergoing outpatient workup via Neurology since January 2024 -continue usual Aricept dose - f/u w/ Neurology as previously planned     Allergies as of 09/22/2023       Reactions   Clopidogrel Anaphylaxis, Other (See Comments)   "Wierd, like out of body thing" Out of body experience    Plavix [clopidogrel Bisulfate]         Medication List     STOP taking these medications    aspirin EC 81 MG tablet   colchicine 0.6 MG tablet   metolazone 2.5 MG tablet Commonly known as: ZAROXOLYN   rivaroxaban 20 MG Tabs tablet Commonly known as: XARELTO   sildenafil 20 MG tablet Commonly known as: REVATIO       TAKE these medications    apixaban 5 MG Tabs tablet Commonly known as: ELIQUIS Take 1 tablet (5 mg total) by mouth 2 (two) times daily.   atorvastatin 20 MG tablet Commonly known as: LIPITOR Take 1 tablet (20 mg total) by mouth daily. What changed:  how much to take when to take this   carvedilol 3.125 MG tablet Commonly known as: COREG Take 1 tablet (3.125 mg total) by mouth 2 (two) times daily.   cyanocobalamin 1000 MCG tablet Commonly known as: VITAMIN B12 Take 1 tablet (1,000 mcg total) by mouth daily.   donepezil 10 MG tablet Commonly known as: ARICEPT Take 1 tablet (10 mg total) by mouth daily.   empagliflozin 10 MG  Tabs tablet Commonly known as: JARDIANCE Take 1 tablet (10 mg total) by mouth daily.   furosemide 40 MG tablet Commonly known as: LASIX Take 1 tablet (40 mg total) by mouth 2 (two) times daily. What changed:  how much to take how to take this when to take this additional instructions   isosorbide mononitrate 30 MG 24 hr tablet Commonly known as: IMDUR Take 0.5 tablets (15 mg total) by mouth daily.   potassium chloride SA 20 MEQ tablet Commonly known as: KLOR-CON M Take 2 tablets (40 mEq total) by mouth 2 (two) times daily.   SV Iron 325 (65 Fe) MG tablet Generic drug: ferrous sulfate Take 325 mg by mouth 3 (three) times a week.        Day of Discharge BP 106/70 (BP Location: Right Arm)   Pulse (!) 58   Temp 98 F (36.7 C) (Oral)   Resp 16   Ht 6\' 2"  (1.88 m)   Wt 102.2 kg   SpO2 98%   BMI 28.93 kg/m   Physical Exam: General: No acute respiratory distress Lungs: Clear to auscultation bilaterally without wheezes or crackles Cardiovascular: Regular rate and rhythm without murmur gallop or rub normal S1 and S2 Abdomen:  Nontender, nondistended, soft, bowel sounds positive, no rebound, no ascites, no appreciable mass Extremities: No significant cyanosis, clubbing, or edema bilateral lower extremities  Basic Metabolic Panel: Recent Labs  Lab 09/16/23 0338 09/17/23 0419 09/17/23 1418 09/18/23 0444 09/18/23 1505 09/19/23 1623 09/20/23 0417 09/21/23 0341 09/22/23 0453  NA 133* 135   < > 133* 131* 134* 132* 134* 132*  K 3.6 3.1*   < > 3.0* 4.2 3.5 2.9* 3.4* 3.3*  CL 93* 93*   < > 90* 89* 91* 89* 90* 88*  CO2 25 30   < > 33* 29 32 33* 32 30  GLUCOSE 97 89   < > 80 110* 103* 98 88 83  BUN 24* 26*   < > 27* 26* 25* 25* 28* 30*  CREATININE 2.28* 2.31*   < > 2.53* 2.47* 2.39* 2.43* 2.46* 2.44*  CALCIUM 8.7* 8.8*   < > 8.7* 8.3* 8.4* 8.7* 8.7* 9.0  MG 1.9 2.0  --  1.9  --  2.0 2.0  --   --    < > = values in this interval not displayed.    CBC: Recent Labs   Lab 09/16/23 0338 09/17/23 0419 09/19/23 1623 09/20/23 0417  WBC 4.0 3.5* 3.5* 3.6*  HGB 10.7* 10.4* 10.5* 10.8*  HCT 31.9* 30.7* 31.7* 31.6*  MCV 79.9* 80.8 79.4* 79.2*  PLT 159 153 136* 148*    Time spent in discharge (includes decision making & examination of pt): 35 minutes  09/22/2023, 12:17 PM   Lonia Blood, MD [facility] Office  [phone]

## 2023-09-22 ENCOUNTER — Other Ambulatory Visit (HOSPITAL_COMMUNITY): Payer: Self-pay

## 2023-09-22 DIAGNOSIS — I5043 Acute on chronic combined systolic (congestive) and diastolic (congestive) heart failure: Secondary | ICD-10-CM | POA: Diagnosis not present

## 2023-09-22 DIAGNOSIS — I4819 Other persistent atrial fibrillation: Secondary | ICD-10-CM | POA: Diagnosis not present

## 2023-09-22 DIAGNOSIS — I255 Ischemic cardiomyopathy: Secondary | ICD-10-CM | POA: Diagnosis not present

## 2023-09-22 DIAGNOSIS — J9601 Acute respiratory failure with hypoxia: Secondary | ICD-10-CM | POA: Diagnosis not present

## 2023-09-22 LAB — BASIC METABOLIC PANEL
Anion gap: 14 (ref 5–15)
BUN: 30 mg/dL — ABNORMAL HIGH (ref 8–23)
CO2: 30 mmol/L (ref 22–32)
Calcium: 9 mg/dL (ref 8.9–10.3)
Chloride: 88 mmol/L — ABNORMAL LOW (ref 98–111)
Creatinine, Ser: 2.44 mg/dL — ABNORMAL HIGH (ref 0.61–1.24)
GFR, Estimated: 28 mL/min — ABNORMAL LOW (ref >60)
Glucose, Bld: 83 mg/dL (ref 70–99)
Potassium: 3.3 mmol/L — ABNORMAL LOW (ref 3.5–5.1)
Sodium: 132 mmol/L — ABNORMAL LOW (ref 135–145)

## 2023-09-22 MED ORDER — ATORVASTATIN CALCIUM 20 MG PO TABS
20.0000 mg | ORAL_TABLET | Freq: Every day | ORAL | 2 refills | Status: AC
Start: 2023-09-22 — End: ?
  Filled 2023-09-22: qty 30, 30d supply, fill #0

## 2023-09-22 MED ORDER — POTASSIUM CHLORIDE CRYS ER 20 MEQ PO TBCR
40.0000 meq | EXTENDED_RELEASE_TABLET | Freq: Two times a day (BID) | ORAL | Status: DC
  Administered 2023-09-22: 40 meq via ORAL
  Filled 2023-09-22: qty 2

## 2023-09-22 MED ORDER — CARVEDILOL 3.125 MG PO TABS
3.1250 mg | ORAL_TABLET | Freq: Two times a day (BID) | ORAL | 2 refills | Status: DC
  Filled 2023-09-22: qty 60, 30d supply, fill #0

## 2023-09-22 MED ORDER — ISOSORBIDE MONONITRATE ER 30 MG PO TB24
15.0000 mg | ORAL_TABLET | Freq: Every day | ORAL | 2 refills | Status: DC
  Filled 2023-09-22: qty 30, 60d supply, fill #0

## 2023-09-22 MED ORDER — FUROSEMIDE 40 MG PO TABS
40.0000 mg | ORAL_TABLET | Freq: Two times a day (BID) | ORAL | 2 refills | Status: DC
  Filled 2023-09-22: qty 60, 30d supply, fill #0

## 2023-09-22 MED ORDER — APIXABAN 5 MG PO TABS
5.0000 mg | ORAL_TABLET | Freq: Two times a day (BID) | ORAL | 2 refills | Status: DC
  Filled 2023-09-22: qty 60, 30d supply, fill #0

## 2023-09-22 MED ORDER — POTASSIUM CHLORIDE CRYS ER 20 MEQ PO TBCR
40.0000 meq | EXTENDED_RELEASE_TABLET | Freq: Two times a day (BID) | ORAL | 2 refills | Status: DC
  Filled 2023-09-22: qty 120, 30d supply, fill #0

## 2023-09-22 MED ORDER — EMPAGLIFLOZIN 10 MG PO TABS
10.0000 mg | ORAL_TABLET | Freq: Every day | ORAL | 2 refills | Status: DC
  Filled 2023-09-22: qty 30, 30d supply, fill #0

## 2023-09-22 NOTE — Care Management Important Message (Signed)
Important Message  Patient Details  Name: Connor Burns. MRN: [id number] Date of Birth: 04/12/1956   Important Message Given:  Yes - Medicare IM     Sherilyn Banker 09/22/2023, 12:39 PM

## 2023-09-22 NOTE — TOC Benefit Eligibility Note (Signed)
Patient Product/process development scientist completed.    The patient is not insured     This test claim was processed through [employer]- copay amounts may vary at other pharmacies due to [employer], or as the patient moves through the different stages of their insurance plan.     Roland Earl, CPHT Pharmacy Technician III Certified Patient Advocate [facility] Pharmacy Patient Advocate Team Direct Number: [phone]  Fax: [phone]

## 2023-09-22 NOTE — Progress Notes (Signed)
   Heart Failure Stewardship Pharmacist Progress Note   PCP: [name], FNP PCP-Cardiologist: Thomasene Ripple, DO    HPI:  67 yo M with PMH of CAD s/p CABG in 2004, CHF, afib, HTN, HLD, CKD, DVT, CVA, and morbid obesity.   Presented to the ED on 9/20 with shortness of breath and L hand swelling. CXR and CT show evidence of pulmonary congestion and moderate R and small L sided pleural effusion. BNP 664. ECHO 9/21 showed LVEF 35-40% (was 30-35% in 02/2023), RWMA, mild LVH, mildly elevated PA pressure, small pericardial effusion, mild MR.   Current HF Medications: Diuretic: furosemide 40 mg PO BID Beta Blocker: carvedilol 3.125 mg BID SGLT2i: Jardiance 10 mg daily Other: Imdur 15 mg daily  Prior to admission HF Medications: Diuretic: furosemide 40 mg BID MWF and 40 mg daily all other days + metolazone 2.5 mg once weekly Beta blocker: carvedilol 3.125 mg BID  Pertinent Lab Values: Serum creatinine 2.44, BUN 30, Potassium 3.3, Sodium 132, Magnesium 2.0, BNP 664.8  Vital Signs: Weight: 225 lbs (admission weight: 289 lbs) Blood pressure: 120/70s  Heart rate: 60s  I/O: net -0.8L yesterday; net -25.5L since admission  Medication Assistance / Insurance Benefits Check: Does the patient have prescription insurance?  No Type of insurance plan: only medicare A/B  Does the patient qualify for medication assistance through manufacturers or grants?   Yes Eligible grants and/or patient assistance programs: Jardiance, Eliquis Medication assistance applications in progress: Jardiance, Eliquis Medication assistance applications approved: none Approved medication assistance renewals will be completed by: [facility]  Outpatient Pharmacy:  Prior to admission outpatient pharmacy: [facility] Is the patient willing to use [facility] TOC pharmacy at discharge? Yes Is the patient willing to transition their outpatient pharmacy to utilize a [facility] outpatient pharmacy?   No    Assessment: 1. Acute on chronic  systolic CHF (LVEF 35-40%), due to ICM. NYHA class III symptoms. - Continue furosemide 40 mg PO BID. Strict I/Os and daily weights. Keep K>4 and Mg>2. KCl 40 mEq BID ordered for replacement.  - Continue carvedilol 3.125 mg BID - No ACE/ARB/ARNI or MRA with CKD - Continue Jardiance 10 mg daily - Continue Imdur 15 mg daily, hydralazine held on 9/29 with soft BP   Plan: 1) Medication changes recommended at this time: - None, discharge today  2) Patient assistance: - No prescription insurance found - Jardiance and Eliquis patient assistance initiated. Patient portions have been completed and signed. Sent to [facility] provider for signature and submission.  3)  Education  - Patient has been educated on current HF medications and potential additions to HF medication regimen - Patient verbalizes understanding that over the next few months, these medication doses may change and more medications may be added to optimize HF regimen - Patient has been educated on basic disease state pathophysiology and goals of therapy   Sharen Hones, PharmD, BCPS Heart Failure Stewardship Pharmacist Phone [phone]

## 2023-09-22 NOTE — Progress Notes (Addendum)
   09/22/23  To Whom it may concern,  Connor Burns. was hospitalized at [facility] from 09/12/2023 through 09/22/2023.  His care was complex, and required the presence of his wife to assist. Both he and his wife should be excused from any work they missed during this time due to this serious medical issue.   Should you have further questions please feel free to contact our office.  Be advised that no medical information will be divulged without a signed HIPA release from the patient.    Sincerely,  Lonia Blood, MD [facility] Office  [phone]

## 2023-09-22 NOTE — TOC Progression Note (Signed)
Transition of Care (TOC) - Progression Note    Patient Details  Name: Connor Burns. MRN: [id number] Date of Birth: 06/20/1956  Transition of Care [facility]) CM/SW Contact  Elliot Cousin, RN Phone Number: [phone] 09/22/2023, 12:42 PM  Clinical Narrative:   HF TOC CM spoke to wife about scale for home. Discussed daily weights. States she is able to afford a scale for home. States she and pt will need note for work. Message sent to attending.     Expected Discharge Plan: Home w Home Health Services Barriers to Discharge: Continued Medical Work up  Expected Discharge Plan and Services In-house Referral: NA Discharge Planning Services: CM Consult Post Acute Care Choice: Home Health Living arrangements for the past 2 months: Single Family Home Expected Discharge Date: 09/22/23                         HH Arranged: RN, PT HH Agency: [facility] Date [employer] Agency Contacted: 09/15/23 Time HH Agency Contacted: 1639 Representative spoke with at [facility] Agency: Haywood Lasso   Social Determinants of Health (SDOH) Interventions SDOH Screenings   Food Insecurity: No Food Insecurity (09/15/2023)  Housing: Low Risk  (09/15/2023)  Transportation Needs: No Transportation Needs (09/12/2023)  Utilities: Not At Risk (09/12/2023)  Alcohol Screen: Low Risk  (09/15/2023)  Depression (PHQ2-9): Low Risk  (03/03/2023)  Financial Resource Strain: Medium Risk (09/15/2023)  Tobacco Use: Low Risk  (09/12/2023)    Readmission Risk Interventions     No data to display

## 2023-09-22 NOTE — Progress Notes (Signed)
Cardiology Progress Note  Patient ID: Connor Burns. MRN: [id number] DOB: 09-24-1956 Date of Encounter: 09/22/2023  Primary Cardiologist: Thomasene Ripple, DO  Subjective   Chief Complaint: SOB  HPI:  Feels well today, able to lie flat comfortable.   Cr elevated but stable, BUN mild trend up Weight 289->225 Total out net negative 25.5L Changed to po Lasix 09/29  Does not have scales at home, says will use if he gets them Needs info on low-Na diet - feels ok and thinks he can go, has a car and will make f/u appts.  ROS:  All other ROS reviewed and negative. Pertinent positives noted in the HPI.     Inpatient Medications  Scheduled Meds:  apixaban  5 mg Oral BID   atorvastatin  20 mg Oral Daily   carvedilol  3.125 mg Oral BID   cyanocobalamin  1,000 mcg Oral Daily   donepezil  10 mg Oral Daily   empagliflozin  10 mg Oral Daily   furosemide  40 mg Oral BID   isosorbide mononitrate  15 mg Oral Daily   Continuous Infusions:   PRN Meds: acetaminophen, lip balm, melatonin, ondansetron **OR** ondansetron (ZOFRAN) IV, mouth rinse   Vital Signs   Vitals:   09/21/23 0648 09/21/23 1319 09/21/23 2041 09/22/23 0500  BP:  104/64 109/65 120/72  Pulse:  73 71 67  Resp:  17 18 18   Temp:  97.6 F (36.4 C) 98.1 F (36.7 C) 98.1 F (36.7 C)  TempSrc:  Oral Oral Oral  SpO2:  99% 98% 96%  Weight: 103.3 kg   102.2 kg  Height:        Intake/Output Summary (Last 24 hours) at 09/22/2023 0841 Last data filed at 09/21/2023 1000 Gross per 24 hour  Intake 20 ml  Output --  Net 20 ml      09/22/2023    5:00 AM 09/21/2023    6:48 AM 09/20/2023    6:25 AM  Last 3 Weights  Weight (lbs) 225 lb 5 oz 227 lb 12.8 oz 249 lb  Weight (kg) 102.2 kg 103.329 kg 112.946 kg      Telemetry   Previously in  A-fib 70 to 80 bpm, PVCs, but Tele was d/c'd 09/29 when it was thought he was a d/c.  I personally reviewed.   Physical Exam   Vitals:   09/21/23 0648 09/21/23 1319 09/21/23 2041 09/22/23  0500  BP:  104/64 109/65 120/72  Pulse:  73 71 67  Resp:  17 18 18   Temp:  97.6 F (36.4 C) 98.1 F (36.7 C) 98.1 F (36.7 C)  TempSrc:  Oral Oral Oral  SpO2:  99% 98% 96%  Weight: 103.3 kg   102.2 kg  Height:        Intake/Output Summary (Last 24 hours) at 09/22/2023 0841 Last data filed at 09/21/2023 1000 Gross per 24 hour  Intake 20 ml  Output --  Net 20 ml       09/22/2023    5:00 AM 09/21/2023    6:48 AM 09/20/2023    6:25 AM  Last 3 Weights  Weight (lbs) 225 lb 5 oz 227 lb 12.8 oz 249 lb  Weight (kg) 102.2 kg 103.329 kg 112.946 kg    Body mass index is 28.93 kg/m.  General: Well nourished, well developed, in no acute distress Head: Atraumatic, normal size  Eyes: PEERLA, EOMI  Neck: Supple, JVD 8 to 10 cm of water Endocrine: No thryomegaly Cardiac: Normal S1, S2; irregular  rhythm, no murmurs Lungs: Clear to auscultation bilaterally, no wheezing, rhonchi or rales  Abd: Soft, nontender, no hepatomegaly  Ext: no edema Musculoskeletal: No deformities, BUE and BLE strength normal and equal Skin: Warm and dry, no rashes   Neuro: Alert and oriented to person, place, time, and situation, CNII-XII grossly intact, no focal deficits  Psych: Normal mood and affect   Labs  High Sensitivity Troponin:   Recent Labs  Lab 09/12/23 1315 09/12/23 1542  TROPONINIHS 39* 37*     Cardiac EnzymesNo results for input(s): "TROPONINI" in the last 168 hours. No results for input(s): "TROPIPOC" in the last 168 hours.  Chemistry Recent Labs  Lab 09/16/23 0338 09/17/23 0419 09/17/23 1418 09/19/23 1623 09/20/23 0417 09/21/23 0341 09/22/23 0453  NA 133* 135   < > 134* 132* 134* 132*  K 3.6 3.1*   < > 3.5 2.9* 3.4* 3.3*  CL 93* 93*   < > 91* 89* 90* 88*  CO2 25 30   < > 32 33* 32 30  GLUCOSE 97 89   < > 103* 98 88 83  BUN 24* 26*   < > 25* 25* 28* 30*  CREATININE 2.28* 2.31*   < > 2.39* 2.43* 2.46* 2.44*  CALCIUM 8.7* 8.8*   < > 8.4* 8.7* 8.7* 9.0  PROT 7.2 6.8  --  6.8  --   --    --   ALBUMIN 3.0* 2.8*  --  2.9*  --   --   --   AST 35 36  --  42*  --   --   --   ALT 23 21  --  21  --   --   --   ALKPHOS 190* 183*  --  183*  --   --   --   BILITOT 4.0* 3.5*  --  3.1*  --   --   --   GFRNONAA 31* 30*   < > 29* 28* 28* 28*  ANIONGAP 15 12   < > 11 10 12 14    < > = values in this interval not displayed.    Hematology Recent Labs  Lab 09/17/23 0419 09/19/23 1623 09/20/23 0417  WBC 3.5* 3.5* 3.6*  RBC 3.80* 3.99* 3.99*  4.00*  HGB 10.4* 10.5* 10.8*  HCT 30.7* 31.7* 31.6*  MCV 80.8 79.4* 79.2*  MCH 27.4 26.3 27.1  MCHC 33.9 33.1 34.2  RDW 18.2* 17.8* 17.8*  PLT 153 136* 148*   BNP No results for input(s): "BNP", "PROBNP" in the last 168 hours.   DDimer No results for input(s): "DDIMER" in the last 168 hours.   Radiology  No results found.  Cardiac Studies  TTE 09/13/2023  1. Left ventricular ejection fraction, by estimation, is 35 to 40%. The  left ventricle has moderately decreased function. The left ventricle  demonstrates regional wall motion abnormalities (see scoring  diagram/findings for description). There is mild  left ventricular hypertrophy. Left ventricular diastolic parameters are  indeterminate.   2. No formed LV mural thrombus noted with Definity contrast.   3. Right ventricular systolic function is mildly reduced. The right  ventricular size is not well visualized. There is mildly elevated  pulmonary artery systolic pressure. The estimated right ventricular  systolic pressure is 40.2 mmHg.   4. A small pericardial effusion is present. The pericardial effusion is  posterior to the left ventricle.   5. The mitral valve is degenerative. Mild mitral valve regurgitation.   6. The aortic valve  is tricuspid. There is mild calcification of the  aortic valve. Aortic valve regurgitation is not visualized. Aortic valve  sclerosis is present, with no evidence of aortic valve stenosis.   7. Aortic dilatation noted. There is borderline  dilatation of the  ascending aorta, measuring 37 mm.   8. The inferior vena cava is dilated in size with <50% respiratory  variability, suggesting right atrial pressure of 15 mmHg.   Patient Profile  67 year old male with history of CAD status post CABG, systolic heart failure/ischemic cardiomyopathy, CKD stage IIIb, obesity, CVA who was admitted on 09/12/2023 for acute on chronic systolic heart failure.   Assessment & Plan   # Acute on chronic systolic heart failure, EF 35 to 40% # Ischemic cardiomyopathy # Hypokalemia # CKD stage IIIb -s/p IV Lasix 120 mg IV twice daily. transitioned to oral diuretics 09/29. Lasix 40 mg BID. Can check BMET as outpt  -Renal function is somewhat stable, Cr 2.44 03/2023. -Continue carvedilol, hydralazine, Imdur, Jardiance. -has outpatient follow-up.  Anticipate discharge likely today  # CAD s/p CABG -statin.  Stop aspirin.  He is on DOAC secondary  # Persistent atrial fibrillation -remains rate controlled -Given tenuous kidney function transition to Eliquis.  Likely a better agent than Xarelto, will need assistance -would benefit from outpatient restoration of NSR.     From a cardiology standpoint, he is on oral medication and has improved symptomatically. Weight is down significantly. He can be discharged from an HF standpoint. He has FU at HF 10/09   For questions or updates, please contact [facility] Please consult [email or web address] for contact info under   Theodore Demark, PA-C 09/22/2023 9:00 AM

## 2023-09-25 ENCOUNTER — Telehealth: Payer: Self-pay

## 2023-09-25 DIAGNOSIS — F02A0 Dementia in other diseases classified elsewhere, mild, without behavioral disturbance, psychotic disturbance, mood disturbance, and anxiety: Secondary | ICD-10-CM | POA: Diagnosis not present

## 2023-09-25 DIAGNOSIS — I2510 Atherosclerotic heart disease of native coronary artery without angina pectoris: Secondary | ICD-10-CM | POA: Diagnosis not present

## 2023-09-25 DIAGNOSIS — I130 Hypertensive heart and chronic kidney disease with heart failure and stage 1 through stage 4 chronic kidney disease, or unspecified chronic kidney disease: Secondary | ICD-10-CM | POA: Diagnosis not present

## 2023-09-25 DIAGNOSIS — E876 Hypokalemia: Secondary | ICD-10-CM | POA: Diagnosis not present

## 2023-09-25 DIAGNOSIS — I739 Peripheral vascular disease, unspecified: Secondary | ICD-10-CM | POA: Diagnosis not present

## 2023-09-25 DIAGNOSIS — N1832 Chronic kidney disease, stage 3b: Secondary | ICD-10-CM | POA: Diagnosis not present

## 2023-09-25 DIAGNOSIS — I5022 Chronic systolic (congestive) heart failure: Secondary | ICD-10-CM | POA: Diagnosis not present

## 2023-09-25 DIAGNOSIS — G309 Alzheimer's disease, unspecified: Secondary | ICD-10-CM | POA: Diagnosis not present

## 2023-09-25 DIAGNOSIS — J9601 Acute respiratory failure with hypoxia: Secondary | ICD-10-CM | POA: Diagnosis not present

## 2023-09-25 NOTE — Transitions of Care (Post Inpatient/ED Visit) (Signed)
09/25/2023  Name: Connor Burns. MRN: [id number] DOB: [birth year]  Today's TOC FU Call Status: Today's TOC FU Call Status:: Successful TOC FU Call Completed TOC FU Call Complete Date: 09/25/23 Patient's Name and Date of Birth confirmed.  Transition Care Management Follow-up Telephone Call Date of Discharge: 09/22/23 Discharge Facility: [facility] Type of Discharge: Inpatient Admission Primary Inpatient Discharge Diagnosis:: congestive heart failure How have you been since you were released from the hospital?: Better Any questions or concerns?: Yes Patient Questions/Concerns:: he needs refills on his medications  Items Reviewed: Did you receive and understand the discharge instructions provided?: Yes Medications obtained,verified, and reconciled?: Yes (Medications Reviewed) Any new allergies since your discharge?: No Do you have support at home?: Yes People in Home: spouse, child(ren), adult  Medications Reviewed Today: Medications Reviewed Today   Medications were not reviewed in this encounter     Home Care and Equipment/Supplies: Were Home Health Services Ordered?: No Has Agency set up a time to come to your home?: No Any new equipment or medical supplies ordered?: No  Functional Questionnaire: Do you need assistance with bathing/showering or dressing?: No Do you need assistance with meal preparation?: No Do you need assistance with eating?: No Do you have difficulty maintaining continence: No Do you need assistance with getting out of bed/getting out of a chair/moving?: No Do you have difficulty managing or taking your medications?: No  Follow up appointments reviewed: PCP Follow-up appointment confirmed?: Yes Date of PCP follow-up appointment?: 09/30/23 Follow-up Provider: [name] [facility] Specialist [facility] Follow-up appointment confirmed?: Yes Date of Specialist follow-up appointment?: 10/08/23 Follow-Up Specialty Provider:: Dr.Tobb Do you need  transportation to your follow-up appointment?: No Do you understand care options if your condition(s) worsen?: Yes-patient verbalized understanding    SIGNATUREYL,RMA

## 2023-09-30 ENCOUNTER — Encounter: Payer: Self-pay | Admitting: Nurse Practitioner

## 2023-09-30 ENCOUNTER — Ambulatory Visit: Payer: Medicare HMO | Admitting: Nurse Practitioner

## 2023-09-30 VITALS — BP 120/80 | HR 83 | Temp 97.9°F | Ht 74.0 in | Wt 237.0 lb

## 2023-09-30 DIAGNOSIS — I5022 Chronic systolic (congestive) heart failure: Secondary | ICD-10-CM

## 2023-09-30 DIAGNOSIS — J9601 Acute respiratory failure with hypoxia: Secondary | ICD-10-CM | POA: Diagnosis not present

## 2023-09-30 DIAGNOSIS — Z09 Encounter for follow-up examination after completed treatment for conditions other than malignant neoplasm: Secondary | ICD-10-CM

## 2023-09-30 DIAGNOSIS — Z8709 Personal history of other diseases of the respiratory system: Secondary | ICD-10-CM | POA: Diagnosis not present

## 2023-09-30 DIAGNOSIS — I509 Heart failure, unspecified: Secondary | ICD-10-CM

## 2023-09-30 DIAGNOSIS — I110 Hypertensive heart disease with heart failure: Secondary | ICD-10-CM

## 2023-09-30 DIAGNOSIS — E876 Hypokalemia: Secondary | ICD-10-CM | POA: Diagnosis not present

## 2023-09-30 DIAGNOSIS — Z1159 Encounter for screening for other viral diseases: Secondary | ICD-10-CM

## 2023-09-30 NOTE — Progress Notes (Addendum)
Connor Burns, CMA,acting as a Neurosurgeon for Connor Felts, FNP.,have documented all relevant documentation on the behalf of Connor Felts, FNP,as directed by  Connor Felts, FNP while in the presence of Connor Felts, FNP.  Subjective:  Patient ID: Connor Burns. , male    DOB: [birth year] , 67 y.o.   MRN: [id number]  Chief Complaint  Patient presents with   hospital f/u    HPI  Patient presents today for a hospital follow up, Patient reports compliance with medication. Patient stated he does have some SOB still but it is a little bit better today. Patient has no concerns today. Patient went to the hospital on 09/12/2023 and was discharged 09/22/2023 for Acute respiratory failure with hypoxia.  He is not as breathless as before, not exerting himself as much. He did not understand what was going on. He had a Home Health Nurse visit once. [facility] nurse to come to check his memory.   His wife is present with him today.      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  He is to restart his Xarelto on Sunday night.   CAD (coronary artery disease)    CHF (congestive heart failure) 01/14/2017   Intermittent CHF with lower extremity edema.  I have given him Lasix to take on a p.r.n. basis.Formatting of this note might be different from the original. Last Assessment & Plan: Formatting of this note might be different from the original. Intermittent CHF with lower extremity edema.  I have given him Las   Chronic kidney disease 10/27/2020   Stage II   Congestive cardiomyopathy 01/14/2017   Cardiomyopathy with LV dysfunction.Last ejection fraction 40-45%  by echocardiography in 2012.Patient currently not taking beta-blocker Ace inhibitor for some reason.We will repeat echocardiography to evaluate LV function then determine the need for afterload reduction and beta-blocker therapy.[place]   Coronary atherosclerosis 01/14/2017   Status remote CABG in 2004 in [place].Left internal mammary artery to the obtuse marginal.Free Right internal mammary artery to the LAD.Saphenous vein graft to OM 2.Saphenous vein graft to the RCA.  Asymptomatic without chest pain or shortness of breath.Last nuclear stress test May 2018:  Large   DVT (deep venous thrombosis)    Dyslipidemia 05/07/2018   Hematuria 08/10/2015   Hypertension    ICD (implantable cardioverter-defibrillator) battery depletion 06/16/2018   Status post dual-chamber ICD generator change 06/16/2018.Last Assessment & Plan: Formatting of this note might be different from the original.Left infraclavicular incision intact with Dermabond.  There is no pocket hematoma present.  No drainage noted.Device interrogation this a.m. reveals appropriate device function.   Ischemic cardiomyopathy 06/18/2016   Echo 09/14/2011 showing LA 4.9cm, EF 40-45%.  Mild to moderate decreased LV systolic function.  Mild concentric LVH.  LA is moderately dilated.  Mild aortic cusp sclerosis. Aortic valve appears to be bicuspid.B. Concerning for potential dysrhythmia (ventricular tachycardia) especially given documented nonsustained ventricular tachy   Mild dementia, unclear etiology 12/30/2022   Myocardial infarction    Obesity (BMI 30-39.9) 08/10/2015   Onychomycosis 05/10/2014   Painful legs and moving toes of left foot 05/10/2014   Paroxysmal atrial fibrillation 08/08/2015   A. Paroxysmal.B. On  Sotalol therapy.Last Assessment & Plan: History of present atrial fibrillation.  Patient will 80 mg twice daily.Also on rivaroxaban 20 mg a day.Patient again refused an ECG.I explained to the patient, that the absence of an EKG, I  canno   Peripheral vascular disease    Shortness of breath 10/27/2020   Exertional   Stroke 10/27/2020   Right frontal infarct per neuroimaging dating back at least to 2013   Syncope 01/14/2017   In the setting of ischemic cardiomyopathy. B. Concerning for potential dysrhythmia (ventricular tachycardia) especially given documented nonsustained ventricular tachycardia - EPS 9/25/201 showing reproducibly inducible nonsustained monomorphic VT up to 10 beat.  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 In the setting of ischemic cardiomyopathy. B. Concerning for potential dysrhythmia (ventricular tachycardia) especially given documented nonsustained ventricular tachycardia - EPS 9/25/201 showing reproducibly inducible nonsustained monomorphic VT up to 10 beat.  Reproducibly inducible nonsustained polymorphic VT.  Inducible VF wi     Family History  Problem Relation Age of Onset   Heart disease Mother        before age 81   Hyperlipidemia Mother    Hypertension Mother    Heart attack Mother    Hyperlipidemia Father    Hypertension Father    Memory loss Brother    Memory loss Brother    Memory loss Half-Sister    Memory loss Maternal Aunt      Current Outpatient Medications:    atorvastatin (LIPITOR) 20 MG tablet, Take 1 tablet (20 mg total) by mouth daily., Disp: 30 tablet, Rfl: 2   carvedilol (COREG) 3.125 MG tablet, Take 1 tablet (3.125 mg total) by mouth 2 (two) times daily., Disp: 60 tablet, Rfl: 2   cyanocobalamin (VITAMIN B12) 1000 MCG tablet, Take 1 tablet (1,000 mcg total) by mouth daily., Disp: 30 tablet, Rfl: 2   donepezil (ARICEPT) 10 MG tablet, Take 1 tablet (10 mg total) by mouth daily., Disp: 30 tablet, Rfl: 11   empagliflozin (JARDIANCE) 10 MG TABS tablet, Take 1 tablet (10 mg total) by mouth daily., Disp: 30 tablet, Rfl: 2    isosorbide mononitrate (IMDUR) 30 MG 24 hr tablet, Take 0.5 tablets (15 mg total) by mouth daily., Disp: 30 tablet, Rfl: 2   SV IRON 325 (65 Fe) MG tablet, Take 325 mg by mouth 3 (three) times a week., Disp: , Rfl:    apixaban (ELIQUIS) 5 MG TABS tablet, Take 1 tablet (5 mg total) by mouth 2 (two) times daily., Disp: 60 tablet, Rfl: 3   furosemide (LASIX) 40 MG tablet, Take 40 mg by mouth 2 (two) times daily., Disp: , Rfl:    potassium chloride SA (KLOR-CON M20) 20 MEQ tablet, Take 80 mEq by mouth 2 (two) times daily., Disp: , Rfl:    Allergies  Allergen Reactions   Clopidogrel Anaphylaxis and Other (See Comments)    "Wierd, like out of body thing" Out of body experience    Plavix [Clopidogrel Bisulfate]      Review of Systems  Constitutional: Negative.   HENT: Negative.    Eyes: Negative.   Respiratory: Negative.  Negative for cough, shortness of breath and wheezing.   Cardiovascular: Negative.  Negative for chest pain, palpitations and leg swelling.  Gastrointestinal: Negative.   Musculoskeletal: Negative.   Neurological: Negative.   Psychiatric/Behavioral: Negative.       Today's Vitals   09/30/23  1449  BP: 120/80  Pulse: 83  Temp: 97.9 F (36.6 C)  Weight: 237 lb (107.5 kg)  Height: 6\' 2"  (1.88 m)   Body mass index is 30.43 kg/m.  Wt Readings from Last 3 Encounters:  10/20/23 254 lb 3.2 oz (115.3 kg)  10/01/23 236 lb 12.8 oz (107.4 kg)  09/30/23 237 lb (107.5 kg)     Objective:  Physical Exam Vitals reviewed.  Constitutional:      General: He is not in acute distress.    Appearance: Normal appearance. He is obese.  HENT:     Head: Normocephalic.  Cardiovascular:     Rate and Rhythm: Normal rate and regular rhythm.     Pulses: Normal pulses.     Heart sounds: Normal heart sounds. No murmur heard. Pulmonary:     Effort: Pulmonary effort is normal. No respiratory distress.     Breath sounds: Normal breath sounds. No wheezing.  Musculoskeletal:         General: Normal range of motion.  Skin:    General: Skin is warm and dry.     Capillary Refill: Capillary refill takes less than 2 seconds.  Neurological:     General: No focal deficit present.     Mental Status: He is alert and oriented to person, place, and time.  Psychiatric:        Mood and Affect: Mood normal.        Behavior: Behavior normal.        Thought Content: Thought content normal.        Judgment: Judgment normal.         Assessment And Plan:  Acute respiratory failure with hypoxia (HCC) Assessment & Plan: He is doing well now, not having the shortness of breath.   Orders: -     BMP8+eGFR -     DG Chest 2 View; Future  Hypertensive heart disease with chronic systolic congestive heart failure [facility]) Assessment & Plan: Recent hospitalization for this  TCM Performed. A member of the clinical team spoke with the patient upon dischare. Discharge summary was reviewed in full detail during the visit. Meds reconciled and compared to discharge meds. Medication list is updated and reviewed with the patient.  Greater than 50% face to face time was spent in counseling an coordination of care.  All questions were answered to the satisfaction of the patient.   He is doing much better and does not have the swelling he had. He has Home health for PT and nurse at this time.    Hypokalemia Assessment & Plan: Will recheck potassium level. He is on supplementation.   Orders: -     BMP8+eGFR -     Hepatitis C antibody  Encounter for hepatitis C screening test for low risk patient Assessment & Plan: Will check Hepatitis C screening due to recent recommendations to screen all adults 18 years and older   Orders: -     Hepatitis C antibody  History of pleural effusion Assessment & Plan: Will recheck CXR. His wife is also aware.   Orders: -     DG Chest 2 View; Future    Return if symptoms worsen or fail to improve, for schedule Welcome to [facility], .  Patient was given  opportunity to ask questions. Patient verbalized understanding of the plan and was able to repeat key elements of the plan. All questions were answered to their satisfaction.    Jeanell Sparrow, FNP, have reviewed all documentation for this  visit. The documentation on 09/30/23 for the exam, diagnosis, procedures, and orders are all accurate and complete.   IF YOU HAVE BEEN REFERRED TO A SPECIALIST, IT MAY TAKE 1-2 WEEKS TO SCHEDULE/PROCESS THE REFERRAL. IF YOU HAVE NOT HEARD FROM US/SPECIALIST IN TWO WEEKS, PLEASE GIVE [place] A CALL AT [phone].

## 2023-10-01 ENCOUNTER — Ambulatory Visit (HOSPITAL_COMMUNITY)
Admit: 2023-10-01 | Discharge: 2023-10-01 | Disposition: A | Discharge status: Home / Self Care | Payer: Medicare HMO | Source: Ambulatory Visit | Attending: Physician Assistant | Admitting: Physician Assistant

## 2023-10-01 ENCOUNTER — Encounter (HOSPITAL_COMMUNITY): Payer: Self-pay

## 2023-10-01 VITALS — BP 113/73 | HR 71 | Wt 236.8 lb

## 2023-10-01 DIAGNOSIS — I252 Old myocardial infarction: Secondary | ICD-10-CM | POA: Diagnosis not present

## 2023-10-01 DIAGNOSIS — N184 Chronic kidney disease, stage 4 (severe): Secondary | ICD-10-CM | POA: Insufficient documentation

## 2023-10-01 DIAGNOSIS — Z91148 Patient's other noncompliance with medication regimen for other reason: Secondary | ICD-10-CM | POA: Diagnosis not present

## 2023-10-01 DIAGNOSIS — I130 Hypertensive heart and chronic kidney disease with heart failure and stage 1 through stage 4 chronic kidney disease, or unspecified chronic kidney disease: Secondary | ICD-10-CM | POA: Diagnosis not present

## 2023-10-01 DIAGNOSIS — I255 Ischemic cardiomyopathy: Secondary | ICD-10-CM

## 2023-10-01 DIAGNOSIS — Z95810 Presence of automatic (implantable) cardiac defibrillator: Secondary | ICD-10-CM | POA: Diagnosis not present

## 2023-10-01 DIAGNOSIS — I739 Peripheral vascular disease, unspecified: Secondary | ICD-10-CM | POA: Diagnosis not present

## 2023-10-01 DIAGNOSIS — I4819 Other persistent atrial fibrillation: Secondary | ICD-10-CM

## 2023-10-01 DIAGNOSIS — F03A0 Unspecified dementia, mild, without behavioral disturbance, psychotic disturbance, mood disturbance, and anxiety: Secondary | ICD-10-CM | POA: Diagnosis not present

## 2023-10-01 DIAGNOSIS — Z79899 Other long term (current) drug therapy: Secondary | ICD-10-CM | POA: Diagnosis not present

## 2023-10-01 DIAGNOSIS — Z86718 Personal history of other venous thrombosis and embolism: Secondary | ICD-10-CM | POA: Diagnosis not present

## 2023-10-01 DIAGNOSIS — I2510 Atherosclerotic heart disease of native coronary artery without angina pectoris: Secondary | ICD-10-CM | POA: Insufficient documentation

## 2023-10-01 DIAGNOSIS — Z7901 Long term (current) use of anticoagulants: Secondary | ICD-10-CM | POA: Insufficient documentation

## 2023-10-01 DIAGNOSIS — F02A0 Dementia in other diseases classified elsewhere, mild, without behavioral disturbance, psychotic disturbance, mood disturbance, and anxiety: Secondary | ICD-10-CM | POA: Diagnosis not present

## 2023-10-01 DIAGNOSIS — I4811 Longstanding persistent atrial fibrillation: Secondary | ICD-10-CM | POA: Insufficient documentation

## 2023-10-01 DIAGNOSIS — G309 Alzheimer's disease, unspecified: Secondary | ICD-10-CM | POA: Diagnosis not present

## 2023-10-01 DIAGNOSIS — N1832 Chronic kidney disease, stage 3b: Secondary | ICD-10-CM | POA: Diagnosis not present

## 2023-10-01 DIAGNOSIS — E669 Obesity, unspecified: Secondary | ICD-10-CM | POA: Insufficient documentation

## 2023-10-01 DIAGNOSIS — Z951 Presence of aortocoronary bypass graft: Secondary | ICD-10-CM | POA: Diagnosis not present

## 2023-10-01 DIAGNOSIS — E876 Hypokalemia: Secondary | ICD-10-CM | POA: Diagnosis not present

## 2023-10-01 DIAGNOSIS — I5020 Unspecified systolic (congestive) heart failure: Secondary | ICD-10-CM | POA: Diagnosis not present

## 2023-10-01 DIAGNOSIS — Z7984 Long term (current) use of oral hypoglycemic drugs: Secondary | ICD-10-CM | POA: Diagnosis not present

## 2023-10-01 DIAGNOSIS — I5022 Chronic systolic (congestive) heart failure: Secondary | ICD-10-CM | POA: Insufficient documentation

## 2023-10-01 DIAGNOSIS — J9601 Acute respiratory failure with hypoxia: Secondary | ICD-10-CM | POA: Diagnosis not present

## 2023-10-01 LAB — COMPREHENSIVE METABOLIC PANEL
ALT: 20 U/L (ref 0–44)
AST: 38 U/L (ref 15–41)
Albumin: 3.2 g/dL — ABNORMAL LOW (ref 3.5–5.0)
Alkaline Phosphatase: 188 U/L — ABNORMAL HIGH (ref 38–126)
Anion gap: 11 (ref 5–15)
BUN: 23 mg/dL (ref 8–23)
CO2: 27 mmol/L (ref 22–32)
Calcium: 9 mg/dL (ref 8.9–10.3)
Chloride: 101 mmol/L (ref 98–111)
Creatinine, Ser: 2.63 mg/dL — ABNORMAL HIGH (ref 0.61–1.24)
GFR, Estimated: 26 mL/min — ABNORMAL LOW (ref >60)
Glucose, Bld: 129 mg/dL — ABNORMAL HIGH (ref 70–99)
Potassium: 4 mmol/L (ref 3.5–5.1)
Sodium: 139 mmol/L (ref 135–145)
Total Bilirubin: 4.7 mg/dL — ABNORMAL HIGH (ref 0.3–1.2)
Total Protein: 7 g/dL (ref 6.5–8.1)

## 2023-10-01 LAB — BRAIN NATRIURETIC PEPTIDE: B Natriuretic Peptide: 615.9 pg/mL — ABNORMAL HIGH (ref 0.0–100.0)

## 2023-10-01 MED ORDER — TORSEMIDE 20 MG PO TABS: 40.0000 mg | ORAL_TABLET | Freq: Two times a day (BID) | ORAL | 3 refills | Status: DC

## 2023-10-01 MED ORDER — POTASSIUM CHLORIDE CRYS ER 20 MEQ PO TBCR: 60.0000 meq | EXTENDED_RELEASE_TABLET | Freq: Two times a day (BID) | ORAL | 3 refills | Status: DC

## 2023-10-01 NOTE — Patient Instructions (Addendum)
Medication Changes:  STOP FUROSEMIDE   START: TORSEMIDE 40MG  (2) TABLETS TWICE DAILY   INCREASE POTASSIUM TO (3) TABLETS TWICE DAILY   Lab Work:  Labs done today, your results will be available in MyChart, we will contact you for abnormal readings.  Special Instructions // Education:  PLEASE GET COMPRESSION STOCKINGS   Follow-Up in: 3 WEEKS AS SCHEDULED   At the [facility], you and your health needs are our priority. We have a designated team specialized in the treatment of Heart Failure. This Care Team includes your primary Heart Failure Specialized Cardiologist (physician), Advanced Practice Providers (APPs- Physician Assistants and Nurse Practitioners), and Pharmacist who all work together to provide you with the care you need, when you need it.   You may see any of the following providers on your designated Care Team at your next follow up:  Dr. Arvilla Meres Dr. Marca Ancona Dr. Dorthula Nettles Dr. Theresia Bough Tonye Becket, NP Robbie Lis, [place] [facility] [place], [place] Brynda Peon, NP [place] Lee, NP Karle Plumber, PharmD   Please be sure to bring in all your medications bottles to every appointment.   Need to Contact us:  If you have any questions or concerns before your next appointment please send [place] a message through [place] or call our office at [phone].    TO LEAVE A MESSAGE FOR THE NURSE SELECT OPTION 2, PLEASE LEAVE A MESSAGE INCLUDING: YOUR NAME DATE OF BIRTH CALL BACK NUMBER REASON FOR CALL**this is important as we prioritize the call backs  YOU WILL RECEIVE A CALL BACK THE SAME DAY AS LONG AS YOU CALL BEFORE 4:00 PM

## 2023-10-01 NOTE — Progress Notes (Signed)
HEART & VASCULAR TRANSITION OF CARE CONSULT NOTE     Referring Physician: Dr. Sharon Seller Primary Care: [name], NP Primary Cardiologist: Dr. Servando Salina  HPI: Referred to clinic by Dr. Sharon Seller with [facility] for heart failure consultation. 67 y.o. male with history of CAD s/p MI/CABG in 2004 in [place], [place], HFrEF/ICM s/p ICD, longstanding persistent atrial fibrillation, hx VT, CKD, hx DVT, obesity, dementia, PAD. He previously slaw Dr. Tenny Craw in 2018, then followed with Cardiology in [place], [place]. Most recently established with Dr. Servando Salina in 03/24 after moving back to the area.   Had cath in 2015. Report not available. Available records suggest 2 of his vein grafts were occluded.  Echo 2019: EF 30-35%  Echo at OSH 03/24: EF 30-35%, RV function not assessed  He had a stress test in 03/24 with large area of infarction in inferior wall without reversible ischemia, LVEF 23%. Study was reviewed by Dr. Servando Salina. Decision made to defer LHC d/t kidney function.   He was admitted 09/24 with acute respiratory failure with hypoxia 2/2 acute on chronic CHF. Had not been compliant with medications. Echo during admit with EF 35-40%, RWMA, RV mildly reduced. Cardiology consulted. He was diuresed. GDMT limited by kidney function, blood pressure too soft for bidil. Weight on admit 294 lb >>> 225 lb at discharge.   He is here today for follow-up. He is accompanied by his wife who assists with a history. Dyspne and lower extremity edema significantly improved. Has not been weighing regularly. His wife recently obtained a scale and reports his home weight today was 237 lb. She is now helping him with medications but he still misses doses. Missed Friday and Saturday meds last week when he went out of town. He has memory testing planned for tomorrow.   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     Past Medical History:  Diagnosis Date   Acute osteomyelitis of ankle and foot 05/17/2014   Arthritis  10/27/2020   Wrist   Atherosclerosis of native arteries of the extremities with ulceration 06/15/2014   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   veral times a day for 10 minutes each time; The patient was also instructed to hold his Xarelto tonight and tomorrow night.  He is to restart his Xarelto on Sunday night.   CAD (coronary artery disease)    CHF (congestive heart failure) 01/14/2017   Intermittent CHF with lower extremity edema.  I have given him Lasix to take on a p.r.n. basis.Formatting of this note might be different from the original. Last Assessment & Plan: Formatting of this note might be different from the original. Intermittent CHF with lower extremity edema.  I have given him Las   Chronic kidney disease 10/27/2020   Stage II   Congestive cardiomyopathy 01/14/2017   Cardiomyopathy with LV dysfunction.Last ejection fraction 40-45% by echocardiography in 2012.Patient currently not taking beta-blocker Ace inhibitor for some reason.We will repeat echocardiography to evaluate LV function then determine the need for afterload reduction and beta-blocker therapy.[place]   Coronary atherosclerosis 01/14/2017   Status remote CABG in 2004 in [place].Left internal mammary artery to the obtuse marginal.Free Right internal mammary artery to the LAD.Saphenous vein graft to OM 2.Saphenous vein graft to the RCA.  Asymptomatic without chest pain or shortness of breath.Last nuclear stress test May 2018:  Large   DVT (deep venous thrombosis)    Dyslipidemia 05/07/2018   Hematuria 08/10/2015   Hypertension    ICD (implantable cardioverter-defibrillator) battery  depletion 06/16/2018   Status post dual-chamber ICD generator change 06/16/2018.Last Assessment & Plan: Formatting of this note might be different from the original.Left infraclavicular incision intact with Dermabond.  There is no pocket hematoma present.  No drainage noted.Device interrogation this a.m. reveals  appropriate device function.   Ischemic cardiomyopathy 06/18/2016   Echo 09/14/2011 showing LA 4.9cm, EF 40-45%.  Mild to moderate decreased LV systolic function.  Mild concentric LVH.  LA is moderately dilated.  Mild aortic cusp sclerosis. Aortic valve appears to be bicuspid.B. Concerning for potential dysrhythmia (ventricular tachycardia) especially given documented nonsustained ventricular tachy   Mild dementia, unclear etiology 12/30/2022   Myocardial infarction    Obesity (BMI 30-39.9) 08/10/2015   Onychomycosis 05/10/2014   Painful legs and moving toes of left foot 05/10/2014   Paroxysmal atrial fibrillation 08/08/2015   A. Paroxysmal.B. On Sotalol therapy.Last Assessment & Plan: History of present atrial fibrillation.  Patient will 80 mg twice daily.Also on rivaroxaban 20 mg a day.Patient again refused an ECG.I explained to the patient, that the absence of an EKG, I canno   Peripheral vascular disease    Shortness of breath 10/27/2020   Exertional   Stroke 10/27/2020   Right frontal infarct per neuroimaging dating back at least to 2013   Syncope 01/14/2017   In the setting of ischemic cardiomyopathy. B. Concerning for potential dysrhythmia (ventricular tachycardia) especially given documented nonsustained ventricular tachycardia - EPS 9/25/201 showing reproducibly inducible nonsustained monomorphic VT up to 10 beat.  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 In the setting of ischemic cardiomyopathy. B. Concerning for potential dysrhythmia (ventricular tachycardia) especially given documented nonsustained ventricular tachycardia - EPS 9/25/201 showing reproducibly inducible nonsustained monomorphic VT up to 10 beat.  Reproducibly inducible nonsustained polymorphic VT.  Inducible VF wi    Current Outpatient Medications  Medication Sig Dispense  Refill   apixaban (ELIQUIS) 5 MG TABS tablet Take 1 tablet (5 mg total) by mouth 2 (two) times daily. 60 tablet 2   atorvastatin (LIPITOR) 20 MG tablet Take 1 tablet (20 mg total) by mouth daily. 30 tablet 2   carvedilol (COREG) 3.125 MG tablet Take 1 tablet (3.125 mg total) by mouth 2 (two) times daily. 60 tablet 2   cyanocobalamin (VITAMIN B12) 1000 MCG tablet Take 1 tablet (1,000 mcg total) by mouth daily. 30 tablet 2   donepezil (ARICEPT) 10 MG tablet Take 1 tablet (10 mg total) by mouth daily. 30 tablet 11   empagliflozin (JARDIANCE) 10 MG TABS tablet Take 1 tablet (10 mg total) by mouth daily. 30 tablet 2   isosorbide mononitrate (IMDUR) 30 MG 24 hr tablet Take 0.5 tablets (15 mg total) by mouth daily. 30 tablet 2   SV IRON 325 (65 Fe) MG tablet Take 325 mg by mouth 3 (three) times a week.     torsemide (DEMADEX) 20 MG tablet Take 2 tablets (40 mg total) by mouth 2 (two) times daily. 120 tablet 3   potassium chloride SA (KLOR-CON M) 20 MEQ tablet Take 3 tablets (60 mEq total) by mouth 2 (two) times daily. 180 tablet 3   No current facility-administered medications for this encounter.    Allergies  Allergen Reactions   Clopidogrel Anaphylaxis and Other (See Comments)    "Wierd, like out of body thing" Out of body experience    Plavix [Clopidogrel Bisulfate]  Social History   Socioeconomic History   Marital status: Married    Spouse name: [name]   Number of children: 1   Years of education: 62   Highest education level: Master's degree (e.g., MA, MS, MEng, MEd, MSW, MBA)  Occupational History   Occupation: Chaplain    Comment: [employer]    Comment: newly retired  Tobacco Use   Smoking status: Never   Smokeless tobacco: Never  Vaping Use   Vaping status: Never Used  Substance and Sexual Activity   Alcohol use: No   Drug use: No   Sexual activity: Not on file  Other Topics Concern   Not on file  Social History Narrative   Lives with wife in a 2 story home.   Has 3 children.     Works as a Metallurgist.     Education: Masters in [employer].   Right handed   Caffeine yes   Social Determinants of Health   Financial Resource Strain: Medium Risk (09/15/2023)   Overall Financial Resource Strain (CARDIA)    Difficulty of Paying Living Expenses: Somewhat hard  Food Insecurity: No Food Insecurity (09/15/2023)   Hunger Vital Sign    Worried About Running Out of Food in the Last Year: Never true    Ran Out of Food in the Last Year: Never true  Transportation Needs: No Transportation Needs (09/12/2023)   PRAPARE - Administrator, Civil Service (Medical): No    Lack of Transportation (Non-Medical): No  Physical Activity: Not on file  Stress: Not on file  Social Connections: Not on file  Intimate Partner Violence: Patient Declined (09/16/2023)   Humiliation, Afraid, Rape, and Kick questionnaire    Fear of Current or Ex-Partner: Patient declined    Emotionally Abused: Patient declined    Physically Abused: Patient declined    Sexually Abused: Patient declined      Family History  Problem Relation Age of Onset   Heart disease Mother        before age 5   Hyperlipidemia Mother    Hypertension Mother    Heart attack Mother    Hyperlipidemia Father    Hypertension Father    Memory loss Brother    Memory loss Brother    Memory loss Half-Sister    Memory loss Maternal Aunt     Vitals:   10/01/23 1501  BP: 113/73  Pulse: 71  SpO2: 100%  Weight: 107.4 kg (236 lb 12.8 oz)    PHYSICAL EXAM: General:  Well appearing.  HEENT: normal Neck: supple. JVP to jaw. Carotids 2+ bilat; no bruits.  Cor: PMI nondisplaced. Irregular rhythm. No rubs, gallops or murmurs. Lungs: clear Abdomen: soft, nontender, nondistended.  Extremities: no cyanosis, clubbing, small scabbed over water blisters on left anterior shin, 2+ edema into thighs Neuro: alert & oriented x 3. Affect pleasant.   ECG: Afib 65 bpm, PVC   ASSESSMENT &  PLAN: HFrEF/ICM -Hx MI s/p CABG in 2004 -s/p ICD -EF has been in the 30-35% over the years -Echo 09/24: EF 35-40%, RV mildly reduced -NYHA II currently. However, history is difficult. Volume overloaded. His wife helps him with meds but still misses them intermittently. Switch lasix to Torsemide 40 mg BID for better absorption. Increase KCL to 60 mEq BID. -Continue Coreg 3.125 mg BID -Continue Jardiance 10 mg daily. Has some urinary incontinence. Need to watch for UTI symptoms. -Already on imdur 15 mg daily. Would ideally add hydralazine 10 mg TID. However, this  may be too many changes for him to keep up with today. Consider at follow-up. -Labs today, will see if BMET can be repeated at follow-up with Cards on 10/16  2. CAD -Hx MI and CABG (LIMA to OM, Free RIMA to LAD, SVG to OM2, SVG to RCA) in 2003 -Records report 2 vein grafts occluded on cath in 2015 -No aspirin with anticoagulation -Continue statin -Denies angina  3. Persistent atrial fibrillation -hx Afib ablation -Rate controlled on current dose of Coreg -Anticoagulated with Eliquis 5 BID but not sure about compliance -Has upcoming appointment with EP to discuss rhythm control  4. CKD IV -Scr recently 2.3-2.5 -Labs today  5. Dementia -Follows with Neurology. Has memory testing tomorrow -Seems to be affecting medication compliance. Wife is now assisting with medications  6. VT -Hx ICD placement -Reestablishing with EP   Referred to HFSW (PCP, Medications, Transportation, ETOH Abuse, Drug Abuse, Insurance, Financial ): No Refer to Pharmacy: No Refer to Home Health: No Refer to Advanced Heart Failure Clinic: No Refer to General Cardiology: No, already established  Follow up  3 weeks to assess volume, keep follow-up with Cardiology 10/16

## 2023-10-02 DIAGNOSIS — N1832 Chronic kidney disease, stage 3b: Secondary | ICD-10-CM | POA: Diagnosis not present

## 2023-10-02 DIAGNOSIS — G309 Alzheimer's disease, unspecified: Secondary | ICD-10-CM | POA: Diagnosis not present

## 2023-10-02 DIAGNOSIS — E876 Hypokalemia: Secondary | ICD-10-CM | POA: Diagnosis not present

## 2023-10-02 DIAGNOSIS — I739 Peripheral vascular disease, unspecified: Secondary | ICD-10-CM | POA: Diagnosis not present

## 2023-10-02 DIAGNOSIS — I130 Hypertensive heart and chronic kidney disease with heart failure and stage 1 through stage 4 chronic kidney disease, or unspecified chronic kidney disease: Secondary | ICD-10-CM | POA: Diagnosis not present

## 2023-10-02 DIAGNOSIS — F02A0 Dementia in other diseases classified elsewhere, mild, without behavioral disturbance, psychotic disturbance, mood disturbance, and anxiety: Secondary | ICD-10-CM | POA: Diagnosis not present

## 2023-10-02 DIAGNOSIS — I5022 Chronic systolic (congestive) heart failure: Secondary | ICD-10-CM | POA: Diagnosis not present

## 2023-10-02 DIAGNOSIS — I2510 Atherosclerotic heart disease of native coronary artery without angina pectoris: Secondary | ICD-10-CM | POA: Diagnosis not present

## 2023-10-02 DIAGNOSIS — J9601 Acute respiratory failure with hypoxia: Secondary | ICD-10-CM | POA: Diagnosis not present

## 2023-10-06 DIAGNOSIS — E876 Hypokalemia: Secondary | ICD-10-CM | POA: Diagnosis not present

## 2023-10-06 DIAGNOSIS — I2510 Atherosclerotic heart disease of native coronary artery without angina pectoris: Secondary | ICD-10-CM | POA: Diagnosis not present

## 2023-10-06 DIAGNOSIS — I130 Hypertensive heart and chronic kidney disease with heart failure and stage 1 through stage 4 chronic kidney disease, or unspecified chronic kidney disease: Secondary | ICD-10-CM | POA: Diagnosis not present

## 2023-10-06 DIAGNOSIS — J9601 Acute respiratory failure with hypoxia: Secondary | ICD-10-CM | POA: Diagnosis not present

## 2023-10-06 DIAGNOSIS — I739 Peripheral vascular disease, unspecified: Secondary | ICD-10-CM | POA: Diagnosis not present

## 2023-10-06 DIAGNOSIS — I5022 Chronic systolic (congestive) heart failure: Secondary | ICD-10-CM | POA: Diagnosis not present

## 2023-10-06 DIAGNOSIS — G309 Alzheimer's disease, unspecified: Secondary | ICD-10-CM | POA: Diagnosis not present

## 2023-10-06 DIAGNOSIS — F02A0 Dementia in other diseases classified elsewhere, mild, without behavioral disturbance, psychotic disturbance, mood disturbance, and anxiety: Secondary | ICD-10-CM | POA: Diagnosis not present

## 2023-10-06 DIAGNOSIS — N1832 Chronic kidney disease, stage 3b: Secondary | ICD-10-CM | POA: Diagnosis not present

## 2023-10-07 ENCOUNTER — Telehealth: Payer: Self-pay | Admitting: Physician Assistant

## 2023-10-07 NOTE — Progress Notes (Deleted)
Cardiology Office Note   Date:  10/07/2023  ID:  Connor Mina., DOB 1956/03/18, MRN [id number] PCP:  [name], FNP [facility] Cardiologist: Thomasene Ripple, DO  Reason for visit: Hospital follow-up  History of Present Illness    Connor Burns. is a 67 y.o. male with a hx of MI status post CABG in 2024, s/p ICD, persistent atrial fibrillation, CKD, dementia, VT.  He was admitted to the hospital in September 2024 with acute respiratory failure secondary to acute on chronic heart failure.  Noncompliant with his medications prompting admission.  Echo showed EF 35 to 40%.  Diuresed with admission weight 294 and discharge weight to 225 pounds.  He followed up with the heart failure clinic October 01, 2023.  His home weight day of that appointment was 237 pounds.  His wife helps him with medications but he still misses some doses.  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  His Lasix was switched to torsemide 40 mg twice daily.  Potassium increased to twice daily.  Here for follow-up.  Today, ***  Acute on chronic systolic and diastolic heart failure Ischemic cardiomyopathy -Hx MI s/p CABG in 2004 -s/p ICD -Echo 09/24: EF 35-40%, RV mildly reduced -***  Volume overloaded. His wife helps him with meds but still misses them intermittently. Switch lasix to Torsemide 40 mg BID for better absorption. Increase KCL to 60 mEq BID. -Continue Coreg 3.125 mg BID -Continue Jardiance 10 mg daily. Has some urinary incontinence. Need to watch for UTI symptoms. -Already on imdur 15 mg daily. Would ideally add hydralazine 10 mg TID. However, this may be too many changes for him to keep up with today. Consider at follow-up. -Labs today, will see if BMET can be repeated at follow-up with Cards on 10/16  Coronary artery disease -CABG 2023: LIMA to OM, Free RIMA to LAD, SVG to OM2, SVG to RCA -***  Persistent atrial fibrillation -Status post A-fib  ablation -Followed by EP -Continue stroke prevention with Eliquis and rate control with carvedilol.  Hx of VT -Status post ICD placement -Continue beta-blocker -Followed by EP.  Hypertension -*** -Goal BP is <130/80.  Recommend DASH diet (high in vegetables, fruits, low-fat dairy products, whole grains, poultry, fish, and nuts and low in sweets, sugar-sweetened beverages, and red meats), salt restriction and increase physical activity.  Hyperlipidemia -*** -Recommend cholesterol lowering diets - Mediterranean diet, DASH diet, vegetarian diet, low-carbohydrate diet and avoidance of trans fats.  Discussed healthier choice substitutes.  Nuts, high-fiber foods, and fiber supplements may also improve lipids.    Obesity -Even a 5-10% weight loss can have cardiovascular benefits.   -Recommend moderate intensity activity for 30 minutes 5 days/week and the DASH diet.  Tobacco use  -Recommend tobacco cessation.  Reviewed physiologic effects of nicotine and the immediate-eventual benefits of quitting including improvement in cough/breathing and reduction in cardiovascular events.  Discussed quitting tips such as removing triggers and getting support from family/friends and Quitline [place]. -USPSTF recommends one-time screening for abdominal aortic aneurysm (AAA) by ultrasound in men 74 -9 years old who have ever smoked.      Disposition - Has follow-up with EP on 10/28.  Follow-up in ***     Objective / Physical Exam   EKG today: ***  Vital signs:  There were no vitals taken for this visit.    GEN: No acute distress NECK: No carotid bruits CARDIAC: ***RRR, no murmurs RESPIRATORY:  Clear to auscultation without  rales, wheezing or rhonchi  EXTREMITIES: No edema  Assessment and Plan   ***   {Are you ordering a CV Procedure (e.g. stress test, cath, DCCV, TEE, etc)?   Press F2        :[id number]}    Signed, Bernette Mayers  10/07/2023 [facility]

## 2023-10-07 NOTE — Telephone Encounter (Signed)
Patient is having memory issues, patient left his house and drove to [place] and got lost . Daughter had to redirect him home. Patient is calling banks to get loans, Daughter is worried about his safety as they do not have anyone to watch him. Patient does have an appointment in NOV. But daughter and wife would like to see if he can come in sooner

## 2023-10-08 ENCOUNTER — Ambulatory Visit: Payer: Medicare HMO | Attending: Physician Assistant | Admitting: Physician Assistant

## 2023-10-08 DIAGNOSIS — I255 Ischemic cardiomyopathy: Secondary | ICD-10-CM

## 2023-10-08 DIAGNOSIS — I2510 Atherosclerotic heart disease of native coronary artery without angina pectoris: Secondary | ICD-10-CM

## 2023-10-08 DIAGNOSIS — I5043 Acute on chronic combined systolic (congestive) and diastolic (congestive) heart failure: Secondary | ICD-10-CM

## 2023-10-08 NOTE — Telephone Encounter (Signed)
I advised to POA Briana, patient needs CT done of head. She thanked me for calling. Will call [facility] [phone] to schedule.

## 2023-10-12 DIAGNOSIS — Z1159 Encounter for screening for other viral diseases: Secondary | ICD-10-CM | POA: Insufficient documentation

## 2023-10-12 DIAGNOSIS — E876 Hypokalemia: Secondary | ICD-10-CM | POA: Insufficient documentation

## 2023-10-12 DIAGNOSIS — Z8709 Personal history of other diseases of the respiratory system: Secondary | ICD-10-CM | POA: Insufficient documentation

## 2023-10-12 NOTE — Assessment & Plan Note (Signed)
Will recheck CXR. His wife is also aware.

## 2023-10-12 NOTE — Assessment & Plan Note (Signed)
>>  ASSESSMENT AND PLAN FOR ACUTE EXACERBATION OF CHF (CONGESTIVE HEART FAILURE) (HCC) WRITTEN ON 10/12/2023 11:56 PM BY GEORGINA SPEAKS, FNP  Recent hospitalization for this  TCM Performed. A member of the clinical team spoke with the patient upon dischare. Discharge summary was reviewed in full detail during the visit. Meds reconciled and compared to discharge meds. Medication list is updated and reviewed with the patient.  Greater than 50% face to face time was spent in counseling an coordination of care.  All questions were answered to the satisfaction of the patient.   He is doing much better and does not have the swelling he had. He has Home health for PT and nurse at this time.

## 2023-10-12 NOTE — Assessment & Plan Note (Signed)
He is doing well now, not having the shortness of breath.

## 2023-10-12 NOTE — Assessment & Plan Note (Signed)
Will check Hepatitis C screening due to recent recommendations to screen all adults 18 years and older

## 2023-10-12 NOTE — Assessment & Plan Note (Signed)
Recent hospitalization for this  TCM Performed. A member of the clinical team spoke with the patient upon dischare. Discharge summary was reviewed in full detail during the visit. Meds reconciled and compared to discharge meds. Medication list is updated and reviewed with the patient.  Greater than 50% face to face time was spent in counseling an coordination of care.  All questions were answered to the satisfaction of the patient.   He is doing much better and does not have the swelling he had. He has Home health for PT and nurse at this time.

## 2023-10-12 NOTE — Assessment & Plan Note (Signed)
Will recheck potassium level. He is on supplementation.

## 2023-10-16 ENCOUNTER — Encounter: Payer: Self-pay | Admitting: Physician Assistant

## 2023-10-20 ENCOUNTER — Encounter: Payer: Self-pay | Admitting: *Deleted

## 2023-10-20 ENCOUNTER — Ambulatory Visit: Payer: Medicare HMO | Attending: Cardiology | Admitting: Cardiology

## 2023-10-20 ENCOUNTER — Encounter: Payer: Self-pay | Admitting: Cardiology

## 2023-10-20 VITALS — BP 150/102 | HR 95 | Ht 74.0 in | Wt 254.2 lb

## 2023-10-20 DIAGNOSIS — Z95810 Presence of automatic (implantable) cardiac defibrillator: Secondary | ICD-10-CM | POA: Diagnosis not present

## 2023-10-20 DIAGNOSIS — I4819 Other persistent atrial fibrillation: Secondary | ICD-10-CM

## 2023-10-20 DIAGNOSIS — D6869 Other thrombophilia: Secondary | ICD-10-CM

## 2023-10-20 DIAGNOSIS — I5022 Chronic systolic (congestive) heart failure: Secondary | ICD-10-CM

## 2023-10-20 MED ORDER — APIXABAN 5 MG PO TABS: 5.0000 mg | ORAL_TABLET | Freq: Two times a day (BID) | ORAL | 3 refills | Status: DC

## 2023-10-20 NOTE — Progress Notes (Unsigned)
Electrophysiology Office Note:   Date:  10/20/2023  ID:  [name], DOB 1956-07-20, MRN [id number]  Primary Cardiologist: Thomasene Ripple, DO Electrophysiologist: Regan Lemming, MD  {Click to update primary MD,subspecialty MD or APP then REFRESH:1}    History of Present Illness:   Willian Burns. is a 67 y.o. male with h/o Vidal Schwalbe artery disease post CABG, chronic systolic heart failure due to ischemic cardiomyopathy, hyperlipidemia, longstanding persistent atrial fibrillation, morbid obesity seen today for  for Electrophysiology evaluation of ICD at the request of Connor Burns.    He is admitted to the hospital 08/2023 with respiratory failure and hypoxia due to acute systolic heart failure.  He had not been compliant with his medications.  He was diuresed with weight on admission to 94 pounds down to 225 pounds at discharge.  Since admission, dyspnea and lower extremity edema has improved.  He continues to miss doses of his medications, though his wife does prepare his medicines.  He does have some dementia.  Review of systems complete and found to be negative unless listed in HPI.      EP Information / Studies Reviewed:    EKG is not ordered today. EKG from 10/01/23 reviewed which showed AF, PVC      ICD Interrogation-  reviewed in detail today,  See PACEART report.  Device History: Medtronic Dual Chamber ICD implanted  for CHF History of appropriate therapy: No History of AAD therapy: No   Risk Assessment/Calculations:    CHA2DS2-VASc Score = 4  {Confirm score is correct.  If not, click here to update score.  REFRESH note.  :1} This indicates a 4.8% annual risk of stroke. The patient's score is based upon: CHF History: 1 HTN History: 1 Diabetes History: 0 Stroke History: 0 Vascular Disease History: 1 Age Score: 1 Gender Score: 0   {This patient has a significant risk of stroke if diagnosed with atrial fibrillation.  Please consider VKA or DOAC agent for  anticoagulation if the bleeding risk is acceptable.   You can also use the SmartPhrase .[email or web address] for documentation.   :[id number]}         Physical Exam:   VS:  BP (!) 150/102   Pulse 95   Ht 6\' 2"  (1.88 m)   Wt 254 lb 3.2 oz (115.3 kg)   SpO2 99%   BMI 32.64 kg/m    Wt Readings from Last 3 Encounters:  10/20/23 254 lb 3.2 oz (115.3 kg)  10/01/23 236 lb 12.8 oz (107.4 kg)  09/30/23 237 lb (107.5 kg)     GEN: Well nourished, well developed in no acute distress NECK: No JVD; No carotid bruits CARDIAC: Irregularly irregular rate and rhythm, no murmurs, rubs, gallops RESPIRATORY:  Clear to auscultation without rales, wheezing or rhonchi  ABDOMEN: Soft, non-tender, non-distended EXTREMITIES:  No edema; No deformity   ASSESSMENT AND PLAN:    Chronic systolic dysfunction s/p Medtronic dual chamber ICD  euvolemic today Stable on an appropriate medical regimen Normal ICD function See Pace Art report Weights are going up.  He has been somewhat compliant with his medications.  I have asked him to be more compliant and see how his weight changes.  RV threshold significantly elevated.  RV shock impedance within normal limits.  Chon Buhl continue to monitor.  2.  Coronary artery disease: Post CABG.  Plan per primary cardiology.  3.  Persistent atrial fibrillation: Well rate controlled.  If rhythm control is necessary, would load with amiodarone prior to cardioversion.  Pacemaker has been switched to VVI and would need interrogation prior to and after cardioversion.  4.  Secondary hypercoagulable state: Currently on Xarelto for atrial fibrillation  Disposition:   Follow up with Dr. Elberta Fortis in 12 months   Signed, [name], MD

## 2023-10-20 NOTE — Patient Instructions (Addendum)
Medication Instructions:  Your physician recommends that you continue on your current medications as directed. Please refer to the Current Medication list given to you today.  *If you need a refill on your cardiac medications before your next appointment, please call your pharmacy*   Lab Work: None ordered   Testing/Procedures: None ordered   Follow-Up: At [facility], you and your health needs are our priority.  As part of our continuing mission to provide you with exceptional heart care, we have created designated Provider Care Teams.  These Care Teams include your primary Cardiologist (physician) and Advanced Practice Providers (APPs -  Physician Assistants and Nurse Practitioners) who all work together to provide you with the care you need, when you need it.  We recommend signing up for the patient portal called "MyChart".  Sign up information is provided on this After Visit Summary.  MyChart is used to connect with patients for Virtual Visits (Telemedicine).  Patients are able to view lab/test results, encounter notes, upcoming appointments, etc.  Non-urgent messages can be sent to your provider as well.   To learn more about what you can do with MyChart, go to [email or web address].    Your next appointment:   1 year(s)  The format for your next appointment:   In Person  Provider:   Allegra Lai, MD    Thank you for choosing [facility]!!   Trinidad Curet, RN [phone]   Other Instructions    Device clinic: [phone]

## 2023-10-22 ENCOUNTER — Ambulatory Visit (HOSPITAL_COMMUNITY): Payer: Medicare HMO

## 2023-10-23 DIAGNOSIS — J9601 Acute respiratory failure with hypoxia: Secondary | ICD-10-CM | POA: Diagnosis not present

## 2023-10-23 DIAGNOSIS — G309 Alzheimer's disease, unspecified: Secondary | ICD-10-CM | POA: Diagnosis not present

## 2023-10-23 DIAGNOSIS — I130 Hypertensive heart and chronic kidney disease with heart failure and stage 1 through stage 4 chronic kidney disease, or unspecified chronic kidney disease: Secondary | ICD-10-CM | POA: Diagnosis not present

## 2023-10-23 DIAGNOSIS — I2510 Atherosclerotic heart disease of native coronary artery without angina pectoris: Secondary | ICD-10-CM | POA: Diagnosis not present

## 2023-10-23 DIAGNOSIS — I5022 Chronic systolic (congestive) heart failure: Secondary | ICD-10-CM | POA: Diagnosis not present

## 2023-10-23 DIAGNOSIS — N1832 Chronic kidney disease, stage 3b: Secondary | ICD-10-CM | POA: Diagnosis not present

## 2023-10-23 DIAGNOSIS — F02A0 Dementia in other diseases classified elsewhere, mild, without behavioral disturbance, psychotic disturbance, mood disturbance, and anxiety: Secondary | ICD-10-CM | POA: Diagnosis not present

## 2023-10-23 DIAGNOSIS — E876 Hypokalemia: Secondary | ICD-10-CM | POA: Diagnosis not present

## 2023-10-23 DIAGNOSIS — I739 Peripheral vascular disease, unspecified: Secondary | ICD-10-CM | POA: Diagnosis not present

## 2023-10-25 LAB — CUP PACEART INCLINIC DEVICE CHECK
Date Time Interrogation Session: 20241028150706
Implantable Lead Connection Status: 753985
Implantable Lead Connection Status: 753985
Implantable Lead Implant Date: 20120926
Implantable Lead Implant Date: 20120926
Implantable Lead Location: 753859
Implantable Lead Location: 753860
Implantable Lead Model: 5076
Implantable Pulse Generator Implant Date: 20190625

## 2023-10-27 ENCOUNTER — Other Ambulatory Visit (HOSPITAL_COMMUNITY): Payer: Self-pay

## 2023-10-28 ENCOUNTER — Other Ambulatory Visit (HOSPITAL_COMMUNITY): Payer: Self-pay

## 2023-10-28 ENCOUNTER — Other Ambulatory Visit: Payer: Medicare HMO

## 2023-11-03 ENCOUNTER — Ambulatory Visit (HOSPITAL_COMMUNITY): Payer: Medicare HMO

## 2023-11-07 ENCOUNTER — Ambulatory Visit (HOSPITAL_COMMUNITY)
Admission: RE | Admit: 2023-11-07 | Discharge: 2023-11-07 | Disposition: A | Discharge status: Home / Self Care | Payer: Medicare HMO | Source: Ambulatory Visit | Attending: Adult Health | Admitting: Adult Health

## 2023-11-07 VITALS — BP 104/62 | HR 80 | Wt 237.1 lb

## 2023-11-07 DIAGNOSIS — I130 Hypertensive heart and chronic kidney disease with heart failure and stage 1 through stage 4 chronic kidney disease, or unspecified chronic kidney disease: Secondary | ICD-10-CM | POA: Diagnosis not present

## 2023-11-07 DIAGNOSIS — Z95810 Presence of automatic (implantable) cardiac defibrillator: Secondary | ICD-10-CM | POA: Diagnosis not present

## 2023-11-07 DIAGNOSIS — E669 Obesity, unspecified: Secondary | ICD-10-CM | POA: Diagnosis not present

## 2023-11-07 DIAGNOSIS — Z86718 Personal history of other venous thrombosis and embolism: Secondary | ICD-10-CM | POA: Insufficient documentation

## 2023-11-07 DIAGNOSIS — Z7901 Long term (current) use of anticoagulants: Secondary | ICD-10-CM | POA: Insufficient documentation

## 2023-11-07 DIAGNOSIS — I4811 Longstanding persistent atrial fibrillation: Secondary | ICD-10-CM | POA: Insufficient documentation

## 2023-11-07 DIAGNOSIS — I4819 Other persistent atrial fibrillation: Secondary | ICD-10-CM | POA: Diagnosis not present

## 2023-11-07 DIAGNOSIS — I5022 Chronic systolic (congestive) heart failure: Secondary | ICD-10-CM | POA: Diagnosis not present

## 2023-11-07 DIAGNOSIS — Z951 Presence of aortocoronary bypass graft: Secondary | ICD-10-CM | POA: Diagnosis not present

## 2023-11-07 DIAGNOSIS — N184 Chronic kidney disease, stage 4 (severe): Secondary | ICD-10-CM

## 2023-11-07 DIAGNOSIS — Z79899 Other long term (current) drug therapy: Secondary | ICD-10-CM | POA: Diagnosis not present

## 2023-11-07 DIAGNOSIS — I252 Old myocardial infarction: Secondary | ICD-10-CM | POA: Diagnosis not present

## 2023-11-07 DIAGNOSIS — I2510 Atherosclerotic heart disease of native coronary artery without angina pectoris: Secondary | ICD-10-CM | POA: Diagnosis not present

## 2023-11-07 DIAGNOSIS — F03A0 Unspecified dementia, mild, without behavioral disturbance, psychotic disturbance, mood disturbance, and anxiety: Secondary | ICD-10-CM | POA: Diagnosis not present

## 2023-11-07 LAB — BASIC METABOLIC PANEL
Anion gap: 9 (ref 5–15)
BUN: 19 mg/dL (ref 8–23)
CO2: 30 mmol/L (ref 22–32)
Calcium: 9.1 mg/dL (ref 8.9–10.3)
Chloride: 101 mmol/L (ref 98–111)
Creatinine, Ser: 2.16 mg/dL — ABNORMAL HIGH (ref 0.61–1.24)
GFR, Estimated: 33 mL/min — ABNORMAL LOW (ref >60)
Glucose, Bld: 93 mg/dL (ref 70–99)
Potassium: 3.5 mmol/L (ref 3.5–5.1)
Sodium: 140 mmol/L (ref 135–145)

## 2023-11-07 NOTE — Patient Instructions (Addendum)
Continue current medications  Labs done today, we will call you for abnormal results  Do the following things EVERYDAY: Weigh yourself in the morning before breakfast. Write it down and keep it in a log. Take your medicines as prescribed Eat low salt foods--Limit salt (sodium) to 2000 mg per day.  Stay as active as you can everyday Limit all fluids for the day to less than 2 liters  Thank you for allowing [place] to provider your heart failure care after your recent hospitalization. Please follow-up with [facility], they will call you for an appointment.  If you have any questions, issues, or concerns before your next appointment please call our office at [phone], opt. 2 and leave a message for the triage nurse.

## 2023-11-07 NOTE — Progress Notes (Signed)
ReDS Vest / Clip - 11/07/23 0800       ReDS Vest / Clip   Station Marker C    Ruler Value 31    ReDS Value Range High volume overload    ReDS Actual Value 50

## 2023-11-07 NOTE — Progress Notes (Signed)
HEART & VASCULAR TRANSITION OF CARE    Referring Physician: Dr. Sharon Seller Primary Care: [name], NP Primary Cardiologist: Dr. Servando Salina  HPI: Connor Burns is a  67 y.o. male with history of CAD s/p MI/CABG in 2004 in [place], [place], HFrEF/ICM s/p ICD, longstanding persistent atrial fibrillation, hx VT, CKD Stage III,  DVT, obesity, dementia, PAD. He previously saw Dr. Tenny Craw in 2018, then followed with Cardiology in [place], [place]. Most recently established with Dr. Servando Salina in 03/24 after moving back to the area.   Had cath in 2015. Report not available. Available records suggest 2 of his vein grafts were occluded.  Echo 2019: EF 30-35%  Echo at OSH 03/24: EF 30-35%, RV function not assessed  He had a stress test in 03/24 with large area of infarction in inferior wall without reversible ischemia, LVEF 23%. Study was reviewed by Dr. Servando Salina. Decision made to defer LHC d/t kidney function.   He was admitted 09/24 with acute respiratory failure with hypoxia 2/2 acute on chronic CHF. Had not been compliant with medications. Echo during admit with EF 35-40%, RWMA, RV mildly reduced. Cardiology consulted. He was diuresed. GDMT limited by kidney function, blood pressure too soft for bidil. Discharge weight f225 pounds.    He was seen in the [facility] clinic 10/01/2023. Switched from lasix to torsemide.   Today he returns for HF follow up with his wife and daughter. Overall feeling fine. Gets short of breath after walking a long distance. His wife says he is doing much better. Denies PND/Orthopnea. Appetite ok. No fever or chills. He does not weigh at home. His wife is not sure he has been taking his medications due to memory issues. She gives him the medication before she leaves for work. Followed by [facility] for home health.   Cardiac Test  Echo 09/13/23  1. Left ventricular ejection fraction, by estimation, is 35 to 40%. The  left ventricle has moderately decreased function. The left ventricle  demonstrates  regional wall motion abnormalities (see scoring  diagram/findings for description). There is mild  left ventricular hypertrophy. Left ventricular diastolic parameters are  indeterminate.   2. No formed LV mural thrombus noted with Definity contrast.   3. Right ventricular systolic function is mildly reduced. The right  ventricular size is not well visualized. There is mildly elevated  pulmonary artery systolic pressure. The estimated right ventricular  systolic pressure is 40.2 mmHg.   4. A small pericardial effusion is present. The pericardial effusion is  posterior to the left ventricle.   5. The mitral valve is degenerative. Mild mitral valve regurgitation.   6. The aortic valve is tricuspid. There is mild calcification of the  aortic valve. Aortic valve regurgitation is not visualized. Aortic valve  sclerosis is present, with no evidence of aortic valve stenosis.   7. Aortic dilatation noted. There is b  Past Medical History:  Diagnosis Date   Acute osteomyelitis of ankle and foot 05/17/2014   Arthritis 10/27/2020   Wrist   Atherosclerosis of native arteries of the extremities with ulceration 06/15/2014   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   veral times a day for 10 minutes each time; The patient was also instructed to hold his Xarelto tonight and tomorrow night.  He is to restart his Xarelto on Sunday night.   CAD (coronary artery disease)    CHF (congestive heart failure) 01/14/2017   Intermittent CHF with lower extremity edema.  I have given him Lasix to take on a  p.r.n. basis.Formatting of this note might be different from the original. Last Assessment & Plan: Formatting of this note might be different from the original. Intermittent CHF with lower extremity edema.  I have given him Las   Chronic kidney disease 10/27/2020   Stage II   Congestive cardiomyopathy 01/14/2017   Cardiomyopathy with LV dysfunction.Last ejection fraction 40-45% by  echocardiography in 2012.Patient currently not taking beta-blocker Ace inhibitor for some reason.We will repeat echocardiography to evaluate LV function then determine the need for afterload reduction and beta-blocker therapy.[place]   Coronary atherosclerosis 01/14/2017   Status remote CABG in 2004 in [place].Left internal mammary artery to the obtuse marginal.Free Right internal mammary artery to the LAD.Saphenous vein graft to OM 2.Saphenous vein graft to the RCA.  Asymptomatic without chest pain or shortness of breath.Last nuclear stress test May 2018:  Large   DVT (deep venous thrombosis)    Dyslipidemia 05/07/2018   Hematuria 08/10/2015   Hypertension    ICD (implantable cardioverter-defibrillator) battery depletion 06/16/2018   Status post dual-chamber ICD generator change 06/16/2018.Last Assessment & Plan: Formatting of this note might be different from the original.Left infraclavicular incision intact with Dermabond.  There is no pocket hematoma present.  No drainage noted.Device interrogation this a.m. reveals appropriate device function.   Ischemic cardiomyopathy 06/18/2016   Echo 09/14/2011 showing LA 4.9cm, EF 40-45%.  Mild to moderate decreased LV systolic function.  Mild concentric LVH.  LA is moderately dilated.  Mild aortic cusp sclerosis. Aortic valve appears to be bicuspid.B. Concerning for potential dysrhythmia (ventricular tachycardia) especially given documented nonsustained ventricular tachy   Mild dementia, unclear etiology 12/30/2022   Myocardial infarction    Obesity (BMI 30-39.9) 08/10/2015   Onychomycosis 05/10/2014   Painful legs and moving toes of left foot 05/10/2014   Paroxysmal atrial fibrillation 08/08/2015   A. Paroxysmal.B. On Sotalol therapy.Last Assessment & Plan: History of present atrial fibrillation.  Patient will 80 mg twice daily.Also on rivaroxaban 20 mg a day.Patient again refused an ECG.I explained to the patient, that the absence of an EKG, I canno    Peripheral vascular disease    Shortness of breath 10/27/2020   Exertional   Stroke 10/27/2020   Right frontal infarct per neuroimaging dating back at least to 2013   Syncope 01/14/2017   In the setting of ischemic cardiomyopathy. B. Concerning for potential dysrhythmia (ventricular tachycardia) especially given documented nonsustained ventricular tachycardia - EPS 9/25/201 showing reproducibly inducible nonsustained monomorphic VT up to 10 beat.  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 In the setting of ischemic cardiomyopathy. B. Concerning for potential dysrhythmia (ventricular tachycardia) especially given documented nonsustained ventricular tachycardia - EPS 9/25/201 showing reproducibly inducible nonsustained monomorphic VT up to 10 beat.  Reproducibly inducible nonsustained polymorphic VT.  Inducible VF wi    Current Outpatient Medications  Medication Sig Dispense Refill   apixaban (ELIQUIS) 5 MG TABS tablet Take 1 tablet (5 mg total) by mouth 2 (two) times daily. 60 tablet 3   atorvastatin (LIPITOR) 20 MG tablet Take 1 tablet (20 mg total) by mouth daily. 30 tablet 2   carvedilol (COREG) 3.125 MG tablet Take 1 tablet (3.125 mg total) by mouth 2 (two) times daily. 60 tablet 2   cyanocobalamin (VITAMIN B12) 1000 MCG tablet Take 1 tablet (1,000 mcg total) by mouth daily. 30 tablet 2   donepezil (ARICEPT) 10 MG  tablet Take 1 tablet (10 mg total) by mouth daily. 30 tablet 11   empagliflozin (JARDIANCE) 10 MG TABS tablet Take 1 tablet (10 mg total) by mouth daily. 30 tablet 2   isosorbide mononitrate (IMDUR) 30 MG 24 hr tablet Take 30 mg by mouth daily.     potassium chloride SA (KLOR-CON M) 20 MEQ tablet Take 60 mEq by mouth 2 (two) times daily.     SV IRON 325 (65 Fe) MG tablet Take 325 mg by mouth 3 (three) times a week.     torsemide (DEMADEX)  20 MG tablet Take 40 mg by mouth 2 (two) times daily.     No current facility-administered medications for this encounter.    Allergies  Allergen Reactions   Clopidogrel Anaphylaxis and Other (See Comments)    "Wierd, like out of body thing" Out of body experience    Plavix [Clopidogrel Bisulfate]       Social History   Socioeconomic History   Marital status: Married    Spouse name: [name]   Number of children: 1   Years of education: 20   Highest education level: Master's degree (e.g., MA, MS, MEng, MEd, MSW, MBA)  Occupational History   Occupation: Chaplain    Comment: [employer]    Comment: newly retired  Tobacco Use   Smoking status: Never   Smokeless tobacco: Never  Vaping Use   Vaping status: Never Used  Substance and Sexual Activity   Alcohol use: No   Drug use: No   Sexual activity: Not on file  Other Topics Concern   Not on file  Social History Narrative   Lives with wife in a 2 story home.  Has 3 children.     Works as a Metallurgist.     Education: Masters in [employer].   Right handed   Caffeine yes   Social Determinants of Health   Financial Resource Strain: Medium Risk (09/15/2023)   Overall Financial Resource Strain (CARDIA)    Difficulty of Paying Living Expenses: Somewhat hard  Food Insecurity: No Food Insecurity (09/15/2023)   Hunger Vital Sign    Worried About Running Out of Food in the Last Year: Never true    Ran Out of Food in the Last Year: Never true  Transportation Needs: No Transportation Needs (09/12/2023)   PRAPARE - Administrator, Civil Service (Medical): No    Lack of Transportation (Non-Medical): No  Physical Activity: Not on file  Stress: Not on file  Social Connections: Not on file  Intimate Partner Violence: Patient Declined (09/16/2023)   Humiliation, Afraid, Rape, and Kick questionnaire    Fear of Current or Ex-Partner: Patient declined    Emotionally Abused: Patient declined    Physically Abused: Patient  declined    Sexually Abused: Patient declined      Family History  Problem Relation Age of Onset   Heart disease Mother        before age 67   Hyperlipidemia Mother    Hypertension Mother    Heart attack Mother    Hyperlipidemia Father    Hypertension Father    Memory loss Brother    Memory loss Brother    Memory loss Half-Sister    Memory loss Maternal Aunt     Vitals:   11/07/23 0841  BP: 104/62  Pulse: 80  SpO2: 100%  Weight: 107.6 kg (237 lb 2 oz)   Wt Readings from Last 3 Encounters:  11/07/23 107.6 kg (237  lb 2 oz)  10/20/23 115.3 kg (254 lb 3.2 oz)  10/01/23 107.4 kg (236 lb 12.8 oz)    PHYSICAL EXAM: General: Walked in the clinic. Well appearing. No resp difficulty HEENT: normal Neck: supple. no JVD. Carotids 2+ bilat; no bruits. No lymphadenopathy or thryomegaly appreciated. Cor: PMI nondisplaced. Regular rate & rhythm. No rubs, gallops or murmurs. Lungs: clear Abdomen: soft, nontender, nondistended. No hepatosplenomegaly. No bruits or masses. Good bowel sounds. Extremities: no cyanosis, clubbing, rash, R and LLE trace edema Neuro: alert & orientedx3, cranial nerves grossly intact. moves all 4 extremities w/o difficulty. Affect pleasant  ASSESSMENT & PLAN: HFrEF/ICM -Hx MI s/p CABG in 2004 -s/p ICD, [employer].  -EF has been in the 30-35% over the years -Echo 09/24: EF 35-40%, RV mildly reduced Optivol - Impedance elevated. Not suggestive of fluid accumulation. Activity ~ 2 hours.  -NYHA II  - Volume status stable. Continue Torsemide 40 mg BID . -Continue Coreg 3.125 mg BID -Continue Jardiance 10 mg daily. Has some urinary incontinence. NO issues.  -Continue imdur 30 mg daily.  -BMET  2. CAD -Hx MI and CABG (LIMA to OM, Free RIMA to LAD, SVG to OM2, SVG to RCA) in 2003 -Records report 2 vein grafts occluded on cath in 2015 -No aspirin with anticoagulation -Continue statin - On imdur 30 mg daily  - No chest pain.   3. Persistent atrial  fibrillation -hx Afib ablation -Rate controlled on current dose of Coreg -Continue Eliquis 5 BID  -opting continuation of rate control vs rhythm control given concerns regarding compliance w/ anticoagulation  -Followed by EP.  -Medtronic setting up home remote monitor. Messaged device clinic to enroll.   4. CKD IV -Scr recently 2.3-2.5 -Check BMET today.   5. ?Dementia -Seems to be affecting medication compliance. Wife is now assisting with medications - he has been referred to neurology and has head CT scheduled    6. VT -Hx ICD placement - followed by Dr. Elberta Fortis    Follow up as needed. Will set up follow up with Dr Servando Salina.   Braylinn Gulden NP-C  8:52 AM

## 2023-11-11 ENCOUNTER — Ambulatory Visit: Payer: Medicare HMO | Admitting: Physician Assistant

## 2023-11-11 ENCOUNTER — Other Ambulatory Visit (HOSPITAL_COMMUNITY): Payer: Self-pay

## 2023-11-11 ENCOUNTER — Other Ambulatory Visit: Payer: Self-pay | Admitting: Nurse Practitioner

## 2023-11-18 ENCOUNTER — Other Ambulatory Visit: Payer: Medicare HMO

## 2023-12-03 ENCOUNTER — Institutional Professional Consult (permissible substitution): Payer: Medicare HMO | Admitting: Pulmonary Disease

## 2023-12-09 ENCOUNTER — Encounter: Payer: Medicare HMO | Admitting: Nurse Practitioner

## 2024-01-28 ENCOUNTER — Telehealth: Payer: Medicare Other | Admitting: Nurse Practitioner

## 2024-01-28 ENCOUNTER — Ambulatory Visit: Payer: Self-pay | Admitting: Physician Assistant

## 2024-01-28 ENCOUNTER — Encounter: Payer: Self-pay | Admitting: Nurse Practitioner

## 2024-01-28 ENCOUNTER — Telehealth: Payer: Self-pay | Admitting: Physician Assistant

## 2024-01-28 ENCOUNTER — Ambulatory Visit: Payer: Medicare Other | Admitting: Nurse Practitioner

## 2024-01-28 DIAGNOSIS — I7090 Unspecified atherosclerosis: Secondary | ICD-10-CM

## 2024-01-28 DIAGNOSIS — I4819 Other persistent atrial fibrillation: Secondary | ICD-10-CM

## 2024-01-28 DIAGNOSIS — I130 Hypertensive heart and chronic kidney disease with heart failure and stage 1 through stage 4 chronic kidney disease, or unspecified chronic kidney disease: Secondary | ICD-10-CM

## 2024-01-28 DIAGNOSIS — I110 Hypertensive heart disease with heart failure: Secondary | ICD-10-CM

## 2024-01-28 DIAGNOSIS — I5022 Chronic systolic (congestive) heart failure: Secondary | ICD-10-CM | POA: Diagnosis not present

## 2024-01-28 DIAGNOSIS — Z741 Need for assistance with personal care: Secondary | ICD-10-CM

## 2024-01-28 DIAGNOSIS — R0602 Shortness of breath: Secondary | ICD-10-CM

## 2024-01-28 DIAGNOSIS — F03A0 Unspecified dementia, mild, without behavioral disturbance, psychotic disturbance, mood disturbance, and anxiety: Secondary | ICD-10-CM

## 2024-01-28 DIAGNOSIS — N184 Chronic kidney disease, stage 4 (severe): Secondary | ICD-10-CM | POA: Diagnosis not present

## 2024-01-28 DIAGNOSIS — N1832 Chronic kidney disease, stage 3b: Secondary | ICD-10-CM

## 2024-01-28 DIAGNOSIS — R7309 Other abnormal glucose: Secondary | ICD-10-CM

## 2024-01-28 NOTE — Progress Notes (Incomplete)
Assessment/Plan:   Dementia of unclear etiology without behavioral disturbance***  Connor Burns. is a very pleasant 68 y.o. RH male with a history  hypertension, hyperlipidemia, PVD, Afib on Xarelto, s/p ICD, congestive cardiomyopathy, history of stroke 2006, arthritis, CKD3, gout, peripheral neuropathy,  prediabetes and a diagnosis of mild dementia of unclear etiology per neuropsych evaluation on Jan 2024, seen today in follow up for memory loss. Patient is currently on donepezil 10 mg daily.     Follow up in   months. Patient is scheduled for repeat neuropsych evaluation June 2025 for diagnostic clarity and disease trajectory. Recommend good control of her cardiovascular risk factors Continue to control mood as per PCP     Subjective:    This patient is accompanied in the office by *** who supplements the history.  Previous records as well as any outside records available were reviewed prior to todays visit. Patient was last seen on 07/28/2023 with MMSE of 25/30***   Any changes in memory since last visit? ".  He forgets conversations frequently.  He is frustrated with brain games such as crossword puzzles.  No very active outside of the house. repeats oneself?  Endorsed Disoriented when walking into a room?  Patient denies ***  Leaving objects?  May misplace things but not in unusual places***  Wandering behavior?  denies   Any personality changes since last visit?  denies   Any worsening depression?:  Denies.   Hallucinations or paranoia?  Denies.   Seizures? denies    Any sleep changes?  Denies vivid dreams, REM behavior or sleepwalking   Sleep apnea?   Denies.   Any hygiene concerns? Denies.  Independent of bathing and dressing?  Endorsed  Does the patient needs help with medications?  Wife is in charge *** Who is in charge of the finances?  Wife is in charge that he had been calling the bank to get loans   *** Any changes in appetite?  He forgets that he ate and he  eats again***   Patient have trouble swallowing? Denies.   Does the patient cook? No Any headaches?   denies   Chronic back pain  denies   Ambulates with difficulty? Denies.  *** Recent falls or head injuries? denies     Unilateral weakness, numbness or tingling? denies   Any tremors?  Denies   Any anosmia?  Denies   Any incontinence of urine?  Endorsed***, wears diapers Any bowel dysfunction?   Denies      Patient lives with his wife*** Does the patient drive?  On October, he took the keys and drove to [place], getting lost.  His daughter had to redirect him home.***    Initial visit 02/26/22 The patient is seen in neurologic consultation at the request of [name], [name], MD for the evaluation of memory.  The patient is accompanied by his daughter who supplements the history. This is a 68 y.o. year old RH  male who has had memory issues for about 2 years, initially noticed by his family.  He is good at "it was not bad, but since January it got worse ".  Since that time, he is repeating himself more, but attributes this as multitasking, "I may become more distracted than I forget ".  He denies being disoriented when walking into her room or leaving objects in unusual places.  He ambulates without difficulty, denies any falls or head injuries.  He works out at least 2 times a week.  He  continues to drive, and denies getting lost.  He lives with his wife and his youngest daughter.  His mood is good, he has moments in which she is more irritable "my father is a very passionate person, and he gets angry if he gets bothered ".  "He is not as bad as he used to be ".  He denies any history of depression or anxiety, hallucinations or paranoia.  He sleeps fairly well, denies vivid dreams or sleepwalking or REM behavior.  There are no hygiene concerns, he is independent of bathing and dressing.  Sometimes he forgets his medications.  There are no issues with his finances.  "I am on top of it ".  His appetite  is not great according to him, he is trying to control his sugar intake to maintain a better weight.  He denies any trouble swallowing.  He does not cook.  He denies any headaches,double vision, dizziness, no new focal numbness or tingling, unilateral weakness, tremors or anosmia. No history of seizures. Denies urine incontinence, retention, constipation or diarrhea.  Denies OSA, ETOH or Tobacco. Family History remarkable for maternal grandmother and sister "with some sort of dementia "     Neuropsych evaluation 12/30/22  Briefly, results suggested ongoing impairment surrounding executive functioning, confrontation naming, visuospatial abilities (outside of clock drawing), and all aspects of verbal learning and memory. While receptive language also represented a normative impairment, he missed very few items across this task, suggesting that this might represent more of a normative impairment than a clinical impairment. An additional weakness was exhibited across attention/concentration while performance variability was exhibited across processing speed. The etiology of his presentation is unclear as current testing patterns are fairly nonspecific in nature. His most recent neuroimaging which I was able to review is in the form of a head CT dated to 2013. 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 Given that no more recent scans could be located, I am unable to comment on any additional events over the past 10-11 years or the severity of underlying cerebrovascular disease which could be playing an active role in his current presentation. 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 Across these tasks, he did not benefit from repeated exposure to new information, was largely amnestic after brief delays, and performed poorly across yes/no recognition trials. This suggests concerns for rapid forgetting and a significant memory storage  impairment. While this could raise concerns for the presence of a neurodegenerative illness such as Alzheimer's disease (especially given additional impairment surrounding confrontation naming), visual memory remained very strong. While this is both unexpected and encouraging, continued monitoring will be important to see if cognitive impairments remain stable or if progressive decline over the next few years occurs.   PREVIOUS MEDICATIONS:   CURRENT MEDICATIONS:  Outpatient Encounter Medications as of 01/28/2024  Medication Sig   apixaban (ELIQUIS) 5 MG TABS tablet Take 1 tablet (5 mg total) by mouth 2 (two) times daily.   atorvastatin (LIPITOR) 20 MG tablet Take 1 tablet (20 mg total) by mouth daily.   carvedilol (COREG) 3.125 MG tablet Take 1 tablet (3.125 mg total) by mouth 2 (two) times daily.   cyanocobalamin (VITAMIN B12) 1000 MCG tablet Take 1 tablet (1,000 mcg total) by mouth daily.   donepezil (ARICEPT) 10 MG tablet Take 1 tablet (10 mg total) by mouth daily.   empagliflozin (JARDIANCE) 10 MG TABS tablet Take 1 tablet (10 mg total) by mouth daily.  isosorbide mononitrate (IMDUR) 30 MG 24 hr tablet Take 30 mg by mouth daily.   potassium chloride SA (KLOR-CON M) 20 MEQ tablet Take 60 mEq by mouth 2 (two) times daily.   SV IRON 325 (65 Fe) MG tablet Take 325 mg by mouth 3 (three) times a week.   torsemide (DEMADEX) 20 MG tablet Take 40 mg by mouth 2 (two) times daily.   No facility-administered encounter medications on file as of 01/28/2024.       07/28/2023    5:00 PM  MMSE - Mini Mental State Exam  Orientation to time 3  Orientation to Place 5  Registration 3  Attention/ Calculation 4  Recall 1  Language- name 2 objects 2  Language- repeat 1  Language- follow 3 step command 3  Language- read & follow direction 1  Write a sentence 1  Copy design 1  Total score 25      03/18/2022    3:00 PM  Montreal Cognitive Assessment   Visuospatial/ Executive (0/5) 3  Naming (0/3) 3   Attention: Read list of digits (0/2) 2  Attention: Read list of letters (0/1) 1  Attention: Serial 7 subtraction starting at 100 (0/3) 3  Language: Repeat phrase (0/2) 1  Language : Fluency (0/1) 1  Abstraction (0/2) 1  Delayed Recall (0/5) 3  Orientation (0/6) 6  Total 24  Adjusted Score (based on education) 24    Objective:     PHYSICAL EXAMINATION:    VITALS:  There were no vitals filed for this visit.  GEN:  The patient appears stated age and is in NAD. HEENT:  Normocephalic, atraumatic.   Neurological examination:  General: NAD, well-groomed, appears stated age. Orientation: The patient is alert. Oriented to person, place and not to date Cranial nerves: There is good facial symmetry.The speech is fluent and clear. No aphasia or dysarthria. Fund of knowledge is appropriate. Recent and remote memory are impaired. Attention and concentration are reduced.  Able to name objects and repeat phrases.  Hearing is intact to conversational tone. *** Sensation: Sensation is intact to light touch throughout Motor: Strength is at least antigravity x4. DTR's 2/4 in UE/LE     Movement examination: Tone: There is normal tone in the UE/LE Abnormal movements:  no tremor.  No myoclonus.  No asterixis.   Coordination:  There is no decremation with RAM's. Normal finger to nose  Gait and Station: The patient has no*** difficulty arising out of a deep-seated chair without the use of the hands. The patient's stride length is good.  Gait is cautious and narrow.    Thank you for allowing [place] the opportunity to participate in the care of this nice patient. Please do not hesitate to contact us for any questions or concerns.   Total time spent on today's visit was *** minutes dedicated to this patient today, preparing to see patient, examining the patient, ordering tests and/or medications and counseling the patient, documenting clinical information in the EHR or other health record, independently  interpreting results and communicating results to the patient/family, discussing treatment and goals, answering patient's questions and coordinating care.  Cc:  [name], FNP  Marlowe Kays 01/28/2024 6:55 AM

## 2024-01-28 NOTE — Telephone Encounter (Signed)
 Caller stated patient could not make appointment today due to not doing well. Patient is being seen my home health and they have requested that he needs a Ct scan. Would like feedback from nurse

## 2024-01-28 NOTE — Progress Notes (Deleted)
 LILLETTE Kristeen JINNY Gladis, CMA,acting as a neurosurgeon for [name], FNP.,have documented all relevant documentation on the behalf of [name], FNP,as directed by  [name], FNP while in the presence of [name], FNP.  Subjective:  Patient ID: Connor Burns. , male    DOB: 1956-10-17 , 68 y.o.   MRN: [id number]  No chief complaint on file.   HPI  Patient presents today for a bp follow up, Patient reports compliance with medication. Patient denies any chest pain, SOB, or headaches. Patient has no concerns today.     Past Medical History:  Diagnosis Date  . Acute osteomyelitis of ankle and foot 05/17/2014  . Arthritis 10/27/2020   Wrist  . Atherosclerosis of native arteries of the extremities with ulceration 06/15/2014  . 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   veral times a day for 10 minutes each time; The patient was also instructed to hold his Xarelto  tonight and tomorrow night.  He is to restart his Xarelto  on Sunday night.  SABRA CAD (coronary artery disease)   . CHF (congestive heart failure) 01/14/2017   Intermittent CHF with lower extremity edema.  I have given him Lasix  to take on a p.r.n. basis.Formatting of this note might be different from the original. Last Assessment & Plan: Formatting of this note might be different from the original. Intermittent CHF with lower extremity edema.  I have given him Las  . Chronic kidney disease 10/27/2020   Stage II  . Congestive cardiomyopathy 01/14/2017   Cardiomyopathy with LV dysfunction.Last ejection fraction 40-45% by echocardiography in 2012.Patient currently not taking beta-blocker Ace inhibitor for some reason.We will repeat echocardiography to evaluate LV function then determine the need for afterload reduction and beta-blocker therapy.[place]  . Coronary atherosclerosis 01/14/2017   Status remote CABG in 2004 in [place] .Left internal mammary artery to the obtuse marginal.Free Right internal mammary  artery to the LAD.Saphenous vein graft to OM 2.Saphenous vein graft to the RCA.  Asymptomatic without chest pain or shortness of breath.Last nuclear stress test May 2018:  Large  . DVT (deep venous thrombosis)   . Dyslipidemia 05/07/2018  . Hematuria 08/10/2015  . Hypertension   . ICD (implantable cardioverter-defibrillator) battery depletion 06/16/2018   Status post dual-chamber ICD generator change 06/16/2018.Last Assessment & Plan: Formatting of this note might be different from the original.Left infraclavicular incision intact with Dermabond.  There is no pocket hematoma present.  No drainage noted.Device interrogation this a.m. reveals appropriate device function.  . Ischemic cardiomyopathy 06/18/2016   Echo 09/14/2011 showing LA 4.9cm, EF 40-45%.  Mild to moderate decreased LV systolic function.  Mild concentric LVH.  LA is moderately dilated.  Mild aortic cusp sclerosis. Aortic valve appears to be bicuspid.B. Concerning for potential dysrhythmia (ventricular tachycardia) especially given documented nonsustained ventricular tachy  . Mild dementia, unclear etiology 12/30/2022  . Myocardial infarction   . Obesity (BMI 30-39.9) 08/10/2015  . Onychomycosis 05/10/2014  . Painful legs and moving toes of left foot 05/10/2014  . Paroxysmal atrial fibrillation 08/08/2015   A. Paroxysmal.B. On Sotalol therapy.Last Assessment & Plan: History of present atrial fibrillation.  Patient will 80 mg twice daily.Also on rivaroxaban  20 mg a day.Patient again refused an ECG.I explained to the patient, that the absence of an EKG, I canno  . Peripheral vascular disease   . Shortness of breath 10/27/2020   Exertional  . Stroke 10/27/2020   Right frontal infarct per neuroimaging dating back at least to 2013  . Syncope 01/14/2017   In  the setting of ischemic cardiomyopathy. B. Concerning for potential dysrhythmia (ventricular tachycardia) especially given documented nonsustained ventricular tachycardia - EPS  9/25/201 showing reproducibly inducible nonsustained monomorphic VT up to 10 beat.  Reproducibly inducible nonsustained polymorphic VT.  Inducible VF wi  . Typical atrial flutter 08/08/2015  . Ulcer of foot, chronic 10/06/2014  . Ventricular tachycardia 01/14/2017   A. In the setting of ischemic cardiomyopathy. B. Concerning for potential dysrhythmia (ventricular tachycardia) especially given documented nonsustained ventricular tachycardia - EPS 9/25/201 showing reproducibly inducible nonsustained monomorphic VT up to 10 beat.  Reproducibly inducible nonsustained polymorphic VT.  Inducible VF wi     Family History  Problem Relation Age of Onset  . Heart disease Mother        before age 26  . Hyperlipidemia Mother   . Hypertension Mother   . Heart attack Mother   . Hyperlipidemia Father   . Hypertension Father   . Memory loss Brother   . Memory loss Brother   . Memory loss Half-Sister   . Memory loss Maternal Aunt      Current Outpatient Medications:  .  apixaban  (ELIQUIS ) 5 MG TABS tablet, Take 1 tablet (5 mg total) by mouth 2 (two) times daily., Disp: 60 tablet, Rfl: 3 .  atorvastatin  (LIPITOR) 20 MG tablet, Take 1 tablet (20 mg total) by mouth daily., Disp: 30 tablet, Rfl: 2 .  carvedilol  (COREG ) 3.125 MG tablet, Take 1 tablet (3.125 mg total) by mouth 2 (two) times daily., Disp: 60 tablet, Rfl: 2 .  cyanocobalamin  (VITAMIN B12) 1000 MCG tablet, Take 1 tablet (1,000 mcg total) by mouth daily., Disp: 30 tablet, Rfl: 2 .  donepezil  (ARICEPT ) 10 MG tablet, Take 1 tablet (10 mg total) by mouth daily., Disp: 30 tablet, Rfl: 11 .  empagliflozin  (JARDIANCE ) 10 MG TABS tablet, Take 1 tablet (10 mg total) by mouth daily., Disp: 30 tablet, Rfl: 2 .  isosorbide  mononitrate (IMDUR ) 30 MG 24 hr tablet, Take 30 mg by mouth daily., Disp: , Rfl:  .  potassium chloride  SA (KLOR-CON  M) 20 MEQ tablet, Take 60 mEq by mouth 2 (two) times daily., Disp: , Rfl:  .  SV IRON 325 (65 Fe) MG tablet, Take 325  mg by mouth 3 (three) times a week., Disp: , Rfl:  .  torsemide  (DEMADEX ) 20 MG tablet, Take 40 mg by mouth 2 (two) times daily., Disp: , Rfl:    Allergies  Allergen Reactions  . Clopidogrel Anaphylaxis and Other (See Comments)    Wierd, like out of body thing Out of body experience   . Plavix [Clopidogrel Bisulfate]      Review of Systems   There were no vitals filed for this visit. There is no height or weight on file to calculate BMI.  Wt Readings from Last 3 Encounters:  11/07/23 237 lb 2 oz (107.6 kg)  10/20/23 254 lb 3.2 oz (115.3 kg)  10/01/23 236 lb 12.8 oz (107.4 kg)    The ASCVD Risk score (Arnett DK, et al., 2019) failed to calculate for the following reasons:   Risk score cannot be calculated because patient has a medical history suggesting prior/existing ASCVD  Objective:  Physical Exam      Assessment And Plan:  Acute respiratory failure with hypoxia (HCC)  Hypertensive heart disease with chronic systolic congestive heart failure (HCC)    No follow-ups on file.  Patient was given opportunity to ask questions. Patient verbalized understanding of the plan and was able to repeat key elements  of the plan. All questions were answered to their satisfaction.    LILLETTE [name], FNP, have reviewed all documentation for this visit. The documentation on 01/28/24 for the exam, diagnosis, procedures, and orders are all accurate and complete.   IF YOU HAVE BEEN REFERRED TO A SPECIALIST, IT MAY TAKE 1-2 WEEKS TO SCHEDULE/PROCESS THE REFERRAL. IF YOU HAVE NOT HEARD FROM US /SPECIALIST IN TWO WEEKS, PLEASE GIVE US  A CALL AT [phone].

## 2024-01-28 NOTE — Progress Notes (Addendum)
Virtual Visit via Video Note  Connor Burns, CMA,acting as a scribe for Connor Felts, FNP.,have documented all relevant documentation on the behalf of Connor Felts, FNP,as directed by  Connor Felts, FNP while in the presence of Connor Felts, FNP.  I connected with Connor Burns. on 02/12/24 at 11:20 AM EST by a video enabled telemedicine application and verified that I am speaking with the correct person using two identifiers.  Patient Location: Other:  Set designer - passenger seat Provider Location: Home Office  I discussed the limitations, risks, security, and privacy concerns of performing an evaluation and management service by video and the availability of in person appointments. I also discussed with the patient that there may be a patient responsible charge related to this service. The patient expressed understanding and agreed to proceed.  Subjective: PCP: Connor Felts, FNP  No chief complaint on file.  Patient presents today for a home health follow up.  Patient reports compliance with medication. Patient denies any chest pain, SOB, or headaches. Reports he is not feeling well. Trying to push himself to get through the day. He is laying around and not wanting to do anything, reports having shortness of breath. He and his wife would also like for [place] to speak with [employer] about their daughter who is in [place] for her to assist with caring for her father.   Depending on how much he exerts himself he will have difficulty with breathing. He has to take it slow. He is not able to go to the gym. He was to go to [employer] in December but no showed. The patient does not recall having an appt or receiving a phone call. He is on medical leave from his job per patient. He had one person to come out about one month ago to help him around the house.    Review of Systems  Constitutional:  Positive for malaise/fatigue.  Respiratory:  Positive for shortness of breath.    Cardiovascular: Negative.   Gastrointestinal: Negative.   Genitourinary: Negative.   Musculoskeletal: Negative.   Neurological: Negative.   Psychiatric/Behavioral: Negative.       Current Outpatient Medications:    apixaban (ELIQUIS) 5 MG TABS tablet, Take 1 tablet (5 mg total) by mouth 2 (two) times daily., Disp: 60 tablet, Rfl: 3   atorvastatin (LIPITOR) 20 MG tablet, Take 1 tablet (20 mg total) by mouth daily., Disp: 30 tablet, Rfl: 2   carvedilol (COREG) 3.125 MG tablet, Take 1 tablet (3.125 mg total) by mouth 2 (two) times daily., Disp: 60 tablet, Rfl: 2   cyanocobalamin (VITAMIN B12) 1000 MCG tablet, Take 1 tablet (1,000 mcg total) by mouth daily., Disp: 30 tablet, Rfl: 2   donepezil (ARICEPT) 10 MG tablet, Take 1 tablet (10 mg total) by mouth daily., Disp: 30 tablet, Rfl: 11   empagliflozin (JARDIANCE) 10 MG TABS tablet, Take 1 tablet (10 mg total) by mouth daily., Disp: 30 tablet, Rfl: 2   isosorbide mononitrate (IMDUR) 30 MG 24 hr tablet, Take 30 mg by mouth daily., Disp: , Rfl:    potassium chloride SA (KLOR-CON M) 20 MEQ tablet, Take 60 mEq by mouth 2 (two) times daily., Disp: , Rfl:    SV IRON 325 (65 Fe) MG tablet, Take 325 mg by mouth 3 (three) times a week., Disp: , Rfl:    torsemide (DEMADEX) 20 MG tablet, Take 40 mg by mouth 2 (two) times daily., Disp: , Rfl:    silver sulfADIAZINE (SILVADENE)  1 % cream, Apply 1 Application topically 2 (two) times daily. Clean site of wound and apply cream., Disp: 400 g, Rfl: 1  Observations/Objective: Today's Vitals   01/28/24 1232  PainSc: 7   PainLoc: Abdomen   Physical Exam Vitals reviewed.  Constitutional:      Appearance: Normal appearance. He is obese.  Pulmonary:     Effort: Pulmonary effort is normal. No respiratory distress.     Comments: He is speaking in complete sentences Musculoskeletal:        General: No swelling.  Skin:    General: Skin is warm and dry.     Capillary Refill: Capillary refill takes less than 2  seconds.  Neurological:     General: No focal deficit present.     Mental Status: He is alert and oriented to person, place, and time.     Cranial Nerves: No cranial nerve deficit.     Motor: No weakness.  Psychiatric:        Mood and Affect: Mood normal.        Behavior: Behavior normal.        Thought Content: Thought content normal.        Judgment: Judgment normal.     Assessment and Plan: Hypertensive heart disease with chronic systolic congestive heart failure (HCC) Assessment & Plan: He did not check his blood pressure today. He is advised to continue current medications and f/u with Cardiology  Orders: -     Ambulatory referral to Home Health -     Ambulatory referral to Nephrology  Shortness of breath Assessment & Plan: He is advised to contact [facility] as he missed his appt.   Orders: -     Ambulatory referral to Home Health  Atherosclerosis of artery Assessment & Plan: Continue statin  Orders: -     Ambulatory referral to Home Health  Abnormal glucose -     Ambulatory referral to Home Health  Need for assistance with personal care Assessment & Plan: He has had a continued decline in his health and needs assistance at home. He would benefit from [facility] because he often misses his appts due to not wanting to get out of bed. I explained this to his wife and daughter in [place]. I spoke with [employer] [facility] about his daughter returning home to assist with caring for him, spoke with [facility] reference number [id number]. Phone number [phone]. Will order Home Health as well to work with him again with PT/OT/Nursing/HHA   Mild dementia without behavioral disturbance, psychotic disturbance, mood disturbance, or anxiety, unspecified dementia type [facility]) Assessment & Plan: He is able to have a conversation and aware of his surroundings.    Persistent atrial fibrillation (HCC)  CKD (chronic kidney disease) stage 4, GFR 15-29 ml/min (HCC) Assessment &  Plan: I am referring him to Nephrology his last eGFR was 33, has been as low as 28.   Orders: -     Ambulatory referral to Nephrology    Follow Up Instructions: No follow-ups on file.   I discussed the assessment and treatment plan with the patient. The patient was provided an opportunity to ask questions, and all were answered. The patient agreed with the plan and demonstrated an understanding of the instructions.   The patient was advised to call back or seek an in-person evaluation if the symptoms worsen or if the condition fails to improve as anticipated.  The above assessment and management plan was discussed with the patient. The patient verbalized  understanding of and has agreed to the management plan.   Jeanell Sparrow, FNP, have reviewed all documentation for this visit. The documentation on 01/28/24 for the exam, diagnosis, procedures, and orders are all accurate and complete.

## 2024-02-03 ENCOUNTER — Ambulatory Visit (HOSPITAL_BASED_OUTPATIENT_CLINIC_OR_DEPARTMENT_OTHER)
Admission: EM | Admit: 2024-02-03 | Discharge: 2024-02-03 | Disposition: A | Discharge status: Home / Self Care | Payer: Medicare Other | Attending: Family Medicine | Admitting: Family Medicine

## 2024-02-03 ENCOUNTER — Encounter: Payer: Self-pay | Admitting: Nurse Practitioner

## 2024-02-03 ENCOUNTER — Encounter (HOSPITAL_BASED_OUTPATIENT_CLINIC_OR_DEPARTMENT_OTHER): Payer: Self-pay

## 2024-02-03 DIAGNOSIS — Z741 Need for assistance with personal care: Secondary | ICD-10-CM | POA: Insufficient documentation

## 2024-02-03 DIAGNOSIS — I110 Hypertensive heart disease with heart failure: Secondary | ICD-10-CM | POA: Insufficient documentation

## 2024-02-03 DIAGNOSIS — R600 Localized edema: Secondary | ICD-10-CM

## 2024-02-03 DIAGNOSIS — S81809A Unspecified open wound, unspecified lower leg, initial encounter: Secondary | ICD-10-CM | POA: Diagnosis not present

## 2024-02-03 DIAGNOSIS — R7309 Other abnormal glucose: Secondary | ICD-10-CM | POA: Insufficient documentation

## 2024-02-03 MED ORDER — SILVER SULFADIAZINE 1 % EX CREA: 1.0000 | TOPICAL_CREAM | Freq: Two times a day (BID) | CUTANEOUS | 1 refills | Status: AC

## 2024-02-03 NOTE — Assessment & Plan Note (Signed)
He is advised to contact [facility] as he missed his appt.

## 2024-02-03 NOTE — Discharge Instructions (Addendum)
Follow-up with patient's primary care doctor to discuss home wound care he will need ongoing wound care for lower leg extremity wounds.  I am prescribing Silvadene applied to legs wounds twice daily for until wounds heal.  Avoid applying to unaffected part of the skin as this can cause skin breakdown.  It is important to take all medications as prescribed including fluid pill as this will help prevent recurrent leg wounds as this is related to fluid retention.  Schedule follow-up with cardiology at your earliest convenience.

## 2024-02-03 NOTE — ED Triage Notes (Signed)
Pt c/o acute on chronic SOB. Pt states in Sept he was admitted to the hospital for heart failure. Unable to determine when the current SOB began to increase from baseline. Pt states he has to rest with minimal exertion.  Pt also had wounds to L shin. Wife states they are new, but pt says they have been there awhile.

## 2024-02-03 NOTE — ED Provider Notes (Signed)
[facility]    CSN: [id number] Arrival date & time: 02/03/24  1611      History   Chief Complaint Chief Complaint  Patient presents with   Shortness of Breath   Wound Check    HPI Connor Burns. is a 68 y.o. male.  Patient here accompanied by care giver who reports patient has leg wounds that have been present since patient was discharged from inpatient admission due to CHD exacerbation in October 2024. Wounds are non-painful and where draining months ago, non draining at present.  SOB, present for several months and resulted in hospital admission for CHF exacerbation in October. Caregiver reports that due to transportation issues, he has missed several appointments including with his cardiologist. Caregiver report patient PCP is working to get home health primary care and home health nurse to start coming to the home. SOB worst with exertion per patient. He feels that he breaths ok when he is at rest.  During encounter, caregivers remarks to patient that he needs to take his medications everyday. Patient has a documented dx of Alzheimers and Dementia. 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  He is to restart his Xarelto on Sunday night.   CAD (coronary artery disease)    CHF (congestive heart failure) 01/14/2017   Intermittent CHF with lower extremity edema.  I have given him Lasix to take on a p.r.n. basis.Formatting of this note might be different from the original. Last Assessment & Plan: Formatting of this note might be different from the original. Intermittent CHF with lower extremity edema.  I have given him Las    Chronic kidney disease 10/27/2020   Stage II   Congestive cardiomyopathy 01/14/2017   Cardiomyopathy with LV dysfunction.Last ejection fraction 40-45% by echocardiography in 2012.Patient currently not taking beta-blocker Ace inhibitor for some reason.We will repeat echocardiography to evaluate LV function then determine the need for afterload reduction and beta-blocker therapy.[place]   Coronary atherosclerosis 01/14/2017   Status remote CABG in 2004 in [place].Left internal mammary artery to the obtuse marginal.Free Right internal mammary artery to the LAD.Saphenous vein graft to OM 2.Saphenous vein graft to the RCA.  Asymptomatic without chest pain or shortness of breath.Last nuclear stress test May 2018:  Large   DVT (deep venous thrombosis)    Dyslipidemia 05/07/2018   Hematuria 08/10/2015   Hypertension    ICD (implantable cardioverter-defibrillator) battery depletion 06/16/2018   Status post dual-chamber ICD generator change 06/16/2018.Last Assessment & Plan: Formatting of this note might be different from the original.Left infraclavicular incision intact with Dermabond.  There is no pocket hematoma present.  No drainage noted.Device interrogation this a.m. reveals appropriate device function.   Ischemic cardiomyopathy 06/18/2016   Echo 09/14/2011 showing LA 4.9cm, EF 40-45%.  Mild to moderate decreased LV systolic function.  Mild concentric LVH.  LA is moderately dilated.  Mild aortic cusp sclerosis. Aortic valve appears to be bicuspid.B. Concerning for potential dysrhythmia (ventricular tachycardia) especially given documented nonsustained ventricular tachy   Mild dementia, unclear etiology 12/30/2022   Myocardial infarction    Obesity (BMI 30-39.9) 08/10/2015   Onychomycosis 05/10/2014   Painful legs and moving toes of  left foot 05/10/2014   Paroxysmal atrial fibrillation 08/08/2015   A. Paroxysmal.B. On Sotalol therapy.Last Assessment & Plan: History of present atrial  fibrillation.  Patient will 80 mg twice daily.Also on rivaroxaban 20 mg a day.Patient again refused an ECG.I explained to the patient, that the absence of an EKG, I canno   Peripheral vascular disease    Shortness of breath 10/27/2020   Exertional   Stroke 10/27/2020   Right frontal infarct per neuroimaging dating back at least to 2013   Syncope 01/14/2017   In the setting of ischemic cardiomyopathy. B. Concerning for potential dysrhythmia (ventricular tachycardia) especially given documented nonsustained ventricular tachycardia - EPS 9/25/201 showing reproducibly inducible nonsustained monomorphic VT up to 10 beat.  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 In the setting of ischemic cardiomyopathy. B. Concerning for potential dysrhythmia (ventricular tachycardia) especially given documented nonsustained ventricular tachycardia - EPS 9/25/201 showing reproducibly inducible nonsustained monomorphic VT up to 10 beat.  Reproducibly inducible nonsustained polymorphic VT.  Inducible VF wi    Patient Active Problem List   Diagnosis Date Noted   Hypertensive heart disease with chronic systolic congestive heart failure (HCC) 02/03/2024   Abnormal glucose 02/03/2024   Need for assistance with personal care 02/03/2024   Hypokalemia 10/12/2023   Encounter for hepatitis C screening test for low risk patient 10/12/2023   History of pleural effusion 10/12/2023   Class 2 obesity due to excess calories with body mass index (BMI) of 37.0 to 37.9 in adult 09/12/2023   Left arm swelling 09/12/2023   Hypoxia 09/12/2023   Acute systolic heart failure (HCC) 09/12/2023   Stage 3b chronic kidney disease (CKD) (HCC) 09/12/2023   Atrial fibrillation, permanent (HCC) 04/05/2023   Depressed left ventricular ejection fraction 04/05/2023   Medication management 04/05/2023    Generalized weakness 03/13/2023   Mild dementia, unclear etiology 12/30/2022   Chronic kidney disease, stage 3b (HCC) 10/27/2020   Myocardial infarction 10/27/2020   Shortness of breath 10/27/2020   Arthritis 10/27/2020   Stroke    Dyslipidemia 05/07/2018   Hypertension 05/07/2018   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   CHF (congestive heart failure) 01/14/2017   Congestive cardiomyopathy 01/14/2017   Coronary atherosclerosis 01/14/2017   Syncope 01/14/2017   Ischemic cardiomyopathy 06/18/2016   Hematuria 08/10/2015   Obesity (BMI 30-39.9) 08/10/2015   Atherosclerosis of native arteries of the extremities with ulceration 06/15/2014   Painful legs and moving toes of left foot 05/10/2014   Onychomycosis 05/10/2014    Past Surgical History:  Procedure Laterality Date   CARDIAC DEFIBRILLATOR PLACEMENT     CORONARY ARTERY BYPASS GRAFT  10/2003   IR RADIOLOGIST EVAL & MGMT  10/26/2020   PR VEIN BYPASS GRAFT,AORTO-FEM-POP         Home Medications    Prior to Admission medications   Medication Sig Start Date End Date Taking? Authorizing Provider  apixaban (ELIQUIS) 5 MG TABS tablet Take 1 tablet (5 mg total) by mouth 2 (two) times daily. 10/20/23  Yes Camnitz, Will Daphine Deutscher, MD  atorvastatin (LIPITOR) 20 MG tablet Take 1 tablet (20 mg total) by mouth daily. 09/22/23  Yes Lonia Blood, MD  carvedilol (COREG) 3.125 MG tablet Take 1 tablet (3.125 mg total) by mouth 2 (two) times daily. 09/22/23  Yes Lonia Blood, MD  cyanocobalamin (VITAMIN B12) 1000 MCG tablet Take 1 tablet (  1,000 mcg total) by mouth daily. 03/03/23  Yes [name], FNP  donepezil (ARICEPT) 10 MG tablet Take 1 tablet (10 mg total) by mouth daily. 07/28/23  Yes Marcos Eke, PA-C  empagliflozin (JARDIANCE) 10 MG TABS tablet Take 1 tablet (10 mg total) by mouth daily. 09/22/23  Yes Lonia Blood, MD  isosorbide mononitrate (IMDUR) 30 MG 24 hr tablet Take 30 mg by mouth  daily.   Yes [provider]  potassium chloride SA (KLOR-CON M) 20 MEQ tablet Take 60 mEq by mouth 2 (two) times daily.   Yes [provider]  silver sulfADIAZINE (SILVADENE) 1 % cream Apply 1 Application topically 2 (two) times daily. Clean site of wound and apply cream. 02/03/24  Yes Bing Neighbors, NP  SV IRON 325 (65 Fe) MG tablet Take 325 mg by mouth 3 (three) times a week. 08/06/23  Yes [provider]  torsemide (DEMADEX) 20 MG tablet Take 40 mg by mouth 2 (two) times daily.   Yes [provider]    Family History Family History  Problem Relation Age of Onset   Heart disease Mother        before age 59   Hyperlipidemia Mother    Hypertension Mother    Heart attack Mother    Hyperlipidemia Father    Hypertension Father    Memory loss Brother    Memory loss Brother    Memory loss Half-Sister    Memory loss Maternal Aunt     Social History Social History   Tobacco Use   Smoking status: Never   Smokeless tobacco: Never  Vaping Use   Vaping status: Never Used  Substance Use Topics   Alcohol use: No   Drug use: No     Allergies   Clopidogrel and Plavix [clopidogrel bisulfate]   Review of Systems Review of Systems  Respiratory:  Positive for shortness of breath (several months, precribed diuretic for CHF, doesn't take consistently).      Physical Exam Triage Vital Signs ED Triage Vitals  Encounter Vitals Group     BP 02/03/24 1647 133/86     Systolic BP Percentile --      Diastolic BP Percentile --      Pulse Rate 02/03/24 1647 72     Resp 02/03/24 1647 20     Temp 02/03/24 1647 97.6 F (36.4 C)     Temp Source 02/03/24 1647 Oral     SpO2 02/03/24 1647 100 %     Weight --      Height --      Head Circumference --      Peak Flow --      Pain Score 02/03/24 1645 0     Pain Loc --      Pain Education --      Exclude from Growth Chart --    No data found.  Updated Vital Signs BP 133/86 (BP Location: Right Arm)    Pulse 72   Temp 97.6 F (36.4 C) (Oral)   Resp 20   SpO2 100%   Visual Acuity Right Eye Distance:   Left Eye Distance:   Bilateral Distance:    Right Eye Near:   Left Eye Near:    Bilateral Near:     Physical Exam Constitutional:      Comments: Chronically ill-appearing   HENT:     Head: Normocephalic and atraumatic.  Eyes:     Extraocular Movements: Extraocular movements intact.  Cardiovascular:  Rate and Rhythm: Normal rate and regular rhythm.  Pulmonary:     Effort: Pulmonary effort is normal. No tachypnea or bradypnea.     Breath sounds: Normal breath sounds and air entry.  Musculoskeletal:     Right lower leg: No tenderness. 3+ Pitting Edema present.     Left lower leg: No tenderness. 3+ Pitting Edema present.  Skin:    Findings: Wound (bilateral legs, weeping leg wounds chronic) present.  Neurological:     Mental Status: Mental status is at baseline.     Motor: Weakness (generalized weakness) present.      UC Treatments / Results  Labs (all labs ordered are listed, but only abnormal results are displayed) Labs Reviewed - No data to display  EKG   Radiology No results found.  Procedures Procedures (including critical care time)  Medications Ordered in UC Medications - No data to display  Initial Impression / Assessment and Plan / UC Course  I have reviewed the triage vital signs and the nursing notes.  Pertinent labs & imaging results that were available during my care of the patient were reviewed by me and considered in my medical decision making (see chart for details).    Patient with medical history significant for CHF, CKD, hypertension here today with 88-month history of bilateral lower extremity leg wounds related to recurrent bilateral leg edema secondary to CHF.  Patient has missed several appointments with cardiology and was seen virtually by his PCP.  He currently has no outpatient wound care follow-up.  Agreed to start Silvadene but  explained to patient and caregiver that he will need ongoing follow-up for these wounds as they are chronic and will require ongoing management for them to heal.  Patient has significant edema today explained at length the importance of adherence to fluid pills and all medications to prevent fluid overload which will cause shortness of breath.  Patient along with spouse verbalized understanding and agreement with plan and will follow-up with PCP regarding wound care. Final Clinical Impressions(s) / UC Diagnoses   Final diagnoses:  Bilateral edema of lower extremity  Multiple opens wound of lower extremity, unspecified laterality, initial encounter     Discharge Instructions      Follow-up with patient's primary care doctor to discuss home wound care he will need ongoing wound care for lower leg extremity wounds.  I am prescribing Silvadene applied to legs wounds twice daily for until wounds heal.  Avoid applying to unaffected part of the skin as this can cause skin breakdown.  It is important to take all medications as prescribed including fluid pill as this will help prevent recurrent leg wounds as this is related to fluid retention.  Schedule follow-up with cardiology at your earliest convenience.     ED Prescriptions     Medication Sig Dispense Auth. Provider   silver sulfADIAZINE (SILVADENE) 1 % cream Apply 1 Application topically 2 (two) times daily. Clean site of wound and apply cream. 400 g Bing Neighbors, NP      PDMP not reviewed this encounter.   Bing Neighbors, NP 02/06/24 [phone]

## 2024-02-03 NOTE — Assessment & Plan Note (Signed)
No current medications

## 2024-02-03 NOTE — Assessment & Plan Note (Signed)
He is able to have a conversation and aware of his surroundings.

## 2024-02-03 NOTE — Assessment & Plan Note (Addendum)
I am referring him to Nephrology his last eGFR was 33, has been as low as 28.

## 2024-02-03 NOTE — Assessment & Plan Note (Signed)
He did not check his blood pressure today. He is advised to continue current medications and f/u with Cardiology

## 2024-02-03 NOTE — Assessment & Plan Note (Addendum)
He has had a continued decline in his health and needs assistance at home. He would benefit from [facility] because he often misses his appts due to not wanting to get out of bed. I explained this to his wife and daughter in [place]. I spoke with [facility] about his daughter returning home to assist with caring for him, spoke with [facility] reference number [id number]. Phone number [phone]. Will order Home Health as well to work with him again with PT/OT/Nursing/HHA

## 2024-02-03 NOTE — Assessment & Plan Note (Signed)
Continue statin.

## 2024-02-09 ENCOUNTER — Telehealth: Payer: Self-pay | Admitting: Physician Assistant

## 2024-02-09 NOTE — Telephone Encounter (Signed)
I advised to call [facility], she thanked me for calling.

## 2024-02-09 NOTE — Telephone Encounter (Signed)
Ct of head [facility] [phone]

## 2024-02-09 NOTE — Telephone Encounter (Signed)
Carmelina Dane daughter called to see what type of scan Huntley Dec wanted her father to have before seeing her next week

## 2024-02-10 ENCOUNTER — Encounter: Payer: Self-pay | Admitting: Nurse Practitioner

## 2024-02-10 ENCOUNTER — Ambulatory Visit (INDEPENDENT_AMBULATORY_CARE_PROVIDER_SITE_OTHER): Payer: Medicare Other | Admitting: Nurse Practitioner

## 2024-02-10 VITALS — BP 128/70 | HR 78 | Temp 98.3°F | Ht 74.0 in | Wt 282.8 lb

## 2024-02-10 DIAGNOSIS — I5022 Chronic systolic (congestive) heart failure: Secondary | ICD-10-CM

## 2024-02-10 DIAGNOSIS — N1832 Chronic kidney disease, stage 3b: Secondary | ICD-10-CM

## 2024-02-10 DIAGNOSIS — I7025 Atherosclerosis of native arteries of other extremities with ulceration: Secondary | ICD-10-CM | POA: Diagnosis not present

## 2024-02-10 DIAGNOSIS — Z6836 Body mass index (BMI) 36.0-36.9, adult: Secondary | ICD-10-CM

## 2024-02-10 DIAGNOSIS — R0602 Shortness of breath: Secondary | ICD-10-CM | POA: Diagnosis not present

## 2024-02-10 DIAGNOSIS — F03A0 Unspecified dementia, mild, without behavioral disturbance, psychotic disturbance, mood disturbance, and anxiety: Secondary | ICD-10-CM

## 2024-02-10 DIAGNOSIS — E66812 Obesity, class 2: Secondary | ICD-10-CM

## 2024-02-10 DIAGNOSIS — Z2821 Immunization not carried out because of patient refusal: Secondary | ICD-10-CM | POA: Diagnosis not present

## 2024-02-10 DIAGNOSIS — I130 Hypertensive heart and chronic kidney disease with heart failure and stage 1 through stage 4 chronic kidney disease, or unspecified chronic kidney disease: Secondary | ICD-10-CM | POA: Diagnosis not present

## 2024-02-10 DIAGNOSIS — S81802D Unspecified open wound, left lower leg, subsequent encounter: Secondary | ICD-10-CM

## 2024-02-10 DIAGNOSIS — I509 Heart failure, unspecified: Secondary | ICD-10-CM

## 2024-02-10 DIAGNOSIS — Z09 Encounter for follow-up examination after completed treatment for conditions other than malignant neoplasm: Secondary | ICD-10-CM

## 2024-02-10 DIAGNOSIS — R600 Localized edema: Secondary | ICD-10-CM | POA: Diagnosis not present

## 2024-02-10 DIAGNOSIS — I110 Hypertensive heart disease with heart failure: Secondary | ICD-10-CM

## 2024-02-10 DIAGNOSIS — E6609 Other obesity due to excess calories: Secondary | ICD-10-CM

## 2024-02-10 DIAGNOSIS — Z1159 Encounter for screening for other viral diseases: Secondary | ICD-10-CM

## 2024-02-10 NOTE — Progress Notes (Unsigned)
[name], CMA,acting as a Neurosurgeon for [name], FNP.,have documented all relevant documentation on the behalf of [name], FNP,as directed by  [name], FNP while in the presence of [name], FNP.  Subjective:  Patient ID: Connor Burns. , male    DOB: 07/25/1956 , 68 y.o.   MRN: [id number]  Chief Complaint  Patient presents with   Edema    HPI  Patient presents today for a urgent care follow up, Patient reports compliance with medication. Patient denies any chest pain, SOB, or headaches. Patient went to urgent care on 02/03/2024 for Bilateral edema of lower extremity. Patient reports today he is feeling the same.   He is trying to get scheduled with pulmonologist. He is scheduled to go to Dr. Mallory Shirk office this week. His daughter is giving him his medication since being home.      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  He is to restart his Xarelto on Sunday night.   CAD (coronary artery disease)    CHF (congestive heart failure) 01/14/2017   Intermittent CHF with lower extremity edema.  I have given him Lasix to take on a p.r.n. basis.Formatting of this note might be different from the original. Last Assessment & Plan: Formatting of this note might be different from the original. Intermittent CHF with lower extremity edema.  I have given him Las   Chronic kidney disease 10/27/2020   Stage II   Congestive cardiomyopathy 01/14/2017   Cardiomyopathy with LV dysfunction.Last ejection fraction 40-45% by echocardiography in 2012.Patient currently not taking beta-blocker Ace inhibitor for some reason.We will repeat  echocardiography to evaluate LV function then determine the need for afterload reduction and beta-blocker therapy.[place]   Coronary atherosclerosis 01/14/2017   Status remote CABG in 2004 in [place].Left internal mammary artery to the obtuse marginal.Free Right internal mammary artery to the LAD.Saphenous vein graft to OM 2.Saphenous vein graft to the RCA.  Asymptomatic without chest pain or shortness of breath.Last nuclear stress test May 2018:  Large   DVT (deep venous thrombosis)    Dyslipidemia 05/07/2018   Hematuria 08/10/2015   Hypertension    ICD (implantable cardioverter-defibrillator) battery depletion 06/16/2018   Status post dual-chamber ICD generator change 06/16/2018.Last Assessment & Plan: Formatting of this note might be different from the original.Left infraclavicular incision intact with Dermabond.  There is no pocket hematoma present.  No drainage noted.Device interrogation this a.m. reveals appropriate device function.   Ischemic cardiomyopathy 06/18/2016   Echo 09/14/2011 showing LA 4.9cm, EF 40-45%.  Mild to moderate decreased LV systolic function.  Mild concentric LVH.  LA is moderately dilated.  Mild aortic cusp sclerosis. Aortic valve appears to be bicuspid.B. Concerning for potential dysrhythmia (ventricular tachycardia) especially given documented nonsustained ventricular tachy   Mild dementia, unclear etiology 12/30/2022   Myocardial infarction    Obesity (BMI 30-39.9) 08/10/2015   Onychomycosis 05/10/2014   Painful legs and moving toes of left foot 05/10/2014   Paroxysmal atrial fibrillation 08/08/2015   A. Paroxysmal.B. On Sotalol therapy.Last Assessment & Plan: History of present atrial fibrillation.  Patient will 80 mg twice daily.Also on rivaroxaban 20 mg a day.Patient again  refused an ECG.I explained to the patient, that the absence of an EKG, I canno   Peripheral vascular disease    Shortness of breath 10/27/2020   Exertional   Stroke 10/27/2020   Right  frontal infarct per neuroimaging dating back at least to 2013   Syncope 01/14/2017   In the setting of ischemic cardiomyopathy. B. Concerning for potential dysrhythmia (ventricular tachycardia) especially given documented nonsustained ventricular tachycardia - EPS 9/25/201 showing reproducibly inducible nonsustained monomorphic VT up to 10 beat.  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 In the setting of ischemic cardiomyopathy. B. Concerning for potential dysrhythmia (ventricular tachycardia) especially given documented nonsustained ventricular tachycardia - EPS 9/25/201 showing reproducibly inducible nonsustained monomorphic VT up to 10 beat.  Reproducibly inducible nonsustained polymorphic VT.  Inducible VF wi     Family History  Problem Relation Age of Onset   Heart disease Mother        before age 12   Hyperlipidemia Mother    Hypertension Mother    Heart attack Mother    Hyperlipidemia Father    Hypertension Father    Memory loss Brother    Memory loss Brother    Memory loss Half-Sister    Memory loss Maternal Aunt      Current Outpatient Medications:    apixaban (ELIQUIS) 5 MG TABS tablet, Take 1 tablet (5 mg total) by mouth 2 (two) times daily., Disp: 60 tablet, Rfl: 3   atorvastatin (LIPITOR) 20 MG tablet, Take 1 tablet (20 mg total) by mouth daily., Disp: 30 tablet, Rfl: 2   carvedilol (COREG) 3.125 MG tablet, Take 1 tablet (3.125 mg total) by mouth 2 (two) times daily., Disp: 60 tablet, Rfl: 2   cyanocobalamin (VITAMIN B12) 1000 MCG tablet, Take 1 tablet (1,000 mcg total) by mouth daily., Disp: 30 tablet, Rfl: 2   donepezil (ARICEPT) 10 MG tablet, Take 1 tablet (10 mg total) by mouth daily., Disp: 30 tablet, Rfl: 11   empagliflozin (JARDIANCE) 10 MG TABS tablet, Take 1 tablet (10 mg total) by mouth daily., Disp: 30 tablet, Rfl: 2    isosorbide mononitrate (IMDUR) 30 MG 24 hr tablet, Take 30 mg by mouth daily., Disp: , Rfl:    potassium chloride SA (KLOR-CON M) 20 MEQ tablet, Take 60 mEq by mouth 2 (two) times daily., Disp: , Rfl:    silver sulfADIAZINE (SILVADENE) 1 % cream, Apply 1 Application topically 2 (two) times daily. Clean site of wound and apply cream., Disp: 400 g, Rfl: 1   SV IRON 325 (65 Fe) MG tablet, Take 325 mg by mouth 3 (three) times a week., Disp: , Rfl:    torsemide (DEMADEX) 20 MG tablet, Take 40 mg by mouth 2 (two) times daily., Disp: , Rfl:    Allergies  Allergen Reactions   Clopidogrel Anaphylaxis and Other (See Comments)    "Wierd, like out of body thing" Out of body experience    Plavix [Clopidogrel Bisulfate]      Review of Systems   Today's Vitals   02/10/24 0904 02/10/24 1731  BP: 128/70   Pulse: 78   Temp: 98.3 F (36.8 C)   TempSrc: Oral   SpO2: 99% (!) 78%  Weight: 282 lb 12.8 oz (128.3 kg)   Height: 6\' 2"  (1.88 m)   PainSc: 0-No pain    Body mass index is 36.31 kg/m.  Wt Readings from Last 3 Encounters:  02/10/24 282 lb 12.8 oz (128.3 kg)  11/07/23 237 lb 2 oz (107.6 kg)  10/20/23 254 lb 3.2 oz (115.3 kg)     Objective:  Physical Exam Vitals reviewed.  Constitutional:      Appearance: Normal appearance. He is obese.  Cardiovascular:     Pulses: Normal pulses.     Heart sounds: Normal heart sounds. No murmur heard. Pulmonary:     Effort: Pulmonary effort is normal. No respiratory distress.     Breath sounds: Normal breath sounds. No wheezing.     Comments: He is speaking in complete sentences Musculoskeletal:        General: No swelling.  Skin:    General: Skin is warm and dry.     Capillary Refill: Capillary refill takes less than 2 seconds.  Neurological:     General: No focal deficit present.     Mental Status: He is alert and oriented to person, place, and time.     Cranial Nerves: No cranial nerve deficit.     Motor: No weakness.  Psychiatric:         Mood and Affect: Mood normal.        Behavior: Behavior normal.        Thought Content: Thought content normal.        Judgment: Judgment normal.         Assessment And Plan:  Hospital discharge follow-up  Bilateral edema of lower extremity -     VAS [place] ABI WITH/WO TBI; Future  Shortness of breath -     AMB Referral VBCI Care Management -     CMP14+EGFR  Hypertensive heart disease with chronic systolic congestive heart failure (HCC) -     AMB Referral VBCI Care Management  Chronic kidney disease, stage 3b (HCC)  Mild dementia without behavioral disturbance, psychotic disturbance, mood disturbance, or anxiety, unspecified dementia type (HCC) -     AMB Referral VBCI Care Management  Atherosclerosis of native arteries of the extremities with ulceration (HCC)  Tetanus, diphtheria, and acellular pertussis (Tdap) vaccination declined  Chronic congestive heart failure, unspecified heart failure type (HCC) -     Brain natriuretic peptide  Herpes zoster vaccination declined  Class 2 obesity due to excess calories with body mass index (BMI) of 36.0 to 36.9 in adult, unspecified whether serious comorbidity present  COVID-19 vaccination declined  Encounter for hepatitis C screening test for low risk patient -     Hepatitis C antibody  Wound of left lower extremity, subsequent encounter   His oxygen level dropped to 74% on Room air after 3:20 Increased to 97% on 2 l/m   No follow-ups on file.  Patient was given opportunity to ask questions. Patient verbalized understanding of the plan and was able to repeat key elements of the plan. All questions were answered to their satisfaction.    Jeanell Sparrow, FNP, have reviewed all documentation for this visit. The documentation on 02/10/24 for the exam, diagnosis, procedures, and orders are all accurate and complete.   IF YOU HAVE BEEN REFERRED TO A SPECIALIST, IT MAY TAKE 1-2 WEEKS TO SCHEDULE/PROCESS THE REFERRAL. IF YOU HAVE NOT  HEARD FROM US/SPECIALIST IN TWO WEEKS, PLEASE GIVE [place] A CALL AT [phone].

## 2024-02-11 ENCOUNTER — Encounter: Payer: Self-pay | Admitting: Nurse Practitioner

## 2024-02-11 ENCOUNTER — Telehealth: Payer: Self-pay | Admitting: *Deleted

## 2024-02-11 ENCOUNTER — Ambulatory Visit: Payer: Medicare Other | Admitting: Nurse Practitioner

## 2024-02-11 DIAGNOSIS — Z2821 Immunization not carried out because of patient refusal: Secondary | ICD-10-CM | POA: Insufficient documentation

## 2024-02-11 DIAGNOSIS — R600 Localized edema: Secondary | ICD-10-CM | POA: Insufficient documentation

## 2024-02-11 DIAGNOSIS — E66812 Obesity, class 2: Secondary | ICD-10-CM | POA: Insufficient documentation

## 2024-02-11 DIAGNOSIS — S81802A Unspecified open wound, left lower leg, initial encounter: Secondary | ICD-10-CM | POA: Insufficient documentation

## 2024-02-11 LAB — CMP14+EGFR
ALT: 16 [IU]/L (ref 0–44)
AST: 34 [IU]/L (ref 0–40)
Albumin: 3.7 g/dL — ABNORMAL LOW (ref 3.9–4.9)
Alkaline Phosphatase: 184 [IU]/L — ABNORMAL HIGH (ref 44–121)
BUN/Creatinine Ratio: 11 (ref 10–24)
BUN: 28 mg/dL — ABNORMAL HIGH (ref 8–27)
Bilirubin Total: 6.9 mg/dL — ABNORMAL HIGH (ref 0.0–1.2)
CO2: 25 mmol/L (ref 20–29)
Calcium: 9.2 mg/dL (ref 8.6–10.2)
Chloride: 99 mmol/L (ref 96–106)
Creatinine, Ser: 2.56 mg/dL — ABNORMAL HIGH (ref 0.76–1.27)
Globulin, Total: 3 g/dL (ref 1.5–4.5)
Glucose: 84 mg/dL (ref 70–99)
Potassium: 4 mmol/L (ref 3.5–5.2)
Sodium: 142 mmol/L (ref 134–144)
Total Protein: 6.7 g/dL (ref 6.0–8.5)
eGFR: 27 mL/min/{1.73_m2} — ABNORMAL LOW (ref >59)

## 2024-02-11 LAB — HEPATITIS C ANTIBODY: Hep C Virus Ab: NONREACTIVE

## 2024-02-11 LAB — BRAIN NATRIURETIC PEPTIDE: BNP: 513.3 pg/mL — ABNORMAL HIGH (ref 0.0–100.0)

## 2024-02-11 NOTE — Assessment & Plan Note (Signed)
He is having episodes of shortness of breath. When he is walking he has shortness of breath. We did a 6 minute walk test and his oxygen dropped to 74% after 3:20 seconds. I have ordered oxygen with [employer].

## 2024-02-11 NOTE — Assessment & Plan Note (Signed)

## 2024-02-11 NOTE — Assessment & Plan Note (Signed)
 he is encouraged to strive for BMI less than 30 to decrease cardiac risk. Advised to aim for at least 150 minutes of exercise per week.

## 2024-02-11 NOTE — Assessment & Plan Note (Signed)
 Will check Hepatitis C screening due to recent recommendations to screen all adults 18 years and older

## 2024-02-11 NOTE — Assessment & Plan Note (Signed)
Blood pressure is well controlled, continue current medications. Continue f/u with Cardiology has appt next week

## 2024-02-11 NOTE — Assessment & Plan Note (Signed)
 Continue statin.

## 2024-02-11 NOTE — Progress Notes (Signed)
Complex Care Management Note  Care Guide Note 02/11/2024 Name: Connor Burns. MRN: [id number] DOB: 15-May-1956  Connor Mina. is a 68 y.o. year old male who sees [name], FNP for primary care. I reached out to Connor Mina. by phone today to offer complex care management services.  [name] spouse Connor Burns, Connor Burns DPR on file was given information about Complex Care Management services today including:   The Complex Care Management services include support from the care team which includes your Nurse Care Manager, Clinical Social Worker, or Pharmacist.  The Complex Care Management team is here to help remove barriers to the health concerns and goals most important to you. Complex Care Management services are voluntary, and the patient may decline or stop services at any time by request to their care team member.   Complex Care Management Consent Status: Patient spouse Connor Burns agreed to services and verbal consent obtained.  Follow up plan:  Telephone appointment with complex care management team member scheduled for:  RNCM 3/5 and SW 3/7  Encounter Outcome:  Patient Scheduled  Gwenevere Ghazi  [facility]  [facility] Guide  Direct Dial: [phone]  Fax [phone]

## 2024-02-11 NOTE — Telephone Encounter (Signed)
Copied from CRM [id number]. Topic: Referral - Status >> Feb 11, 2024  8:23 AM Elle L wrote: Reason for CRM: Aurther Loft with [employer] received oxygen orders from [facility] but they are out of network with the patient's insurance plan. Their number is [phone] if needed.

## 2024-02-11 NOTE — Assessment & Plan Note (Addendum)
Swelling noted to lower extremities firm non pitting. Ordered an ABI ultrasound

## 2024-02-11 NOTE — Assessment & Plan Note (Signed)
He has periods of memory challenges.

## 2024-02-11 NOTE — Assessment & Plan Note (Signed)
Multiple open wounds left lower extremity various healing stages. Then has firm swelling to left lower extremity cleansed area with NS and applied non stick dressing and wrapped with Kerlix. Explained he is to use silvadene to open areas and wrap with nonstick dressing.

## 2024-02-11 NOTE — Assessment & Plan Note (Signed)
 Declines shingrix, educated on disease process and is aware if he changes his mind to notify office

## 2024-02-11 NOTE — Assessment & Plan Note (Signed)
I have referred him to Nephrology at his last visit

## 2024-02-12 ENCOUNTER — Ambulatory Visit: Payer: Medicare Other | Admitting: General Practice

## 2024-02-12 ENCOUNTER — Other Ambulatory Visit: Payer: Self-pay | Admitting: Nurse Practitioner

## 2024-02-12 ENCOUNTER — Other Ambulatory Visit: Payer: Medicare Other

## 2024-02-13 ENCOUNTER — Ambulatory Visit: Payer: Medicare Other | Admitting: Nurse Practitioner

## 2024-02-18 ENCOUNTER — Telehealth: Payer: Self-pay | Admitting: Pharmacist

## 2024-02-18 ENCOUNTER — Ambulatory Visit: Payer: Medicare Other

## 2024-02-18 ENCOUNTER — Ambulatory Visit: Payer: Medicare Other | Attending: Physician Assistant | Admitting: Physician Assistant

## 2024-02-18 ENCOUNTER — Ambulatory Visit
Admission: RE | Admit: 2024-02-18 | Discharge: 2024-02-18 | Disposition: A | Discharge status: Home / Self Care | Payer: Medicare Other | Source: Ambulatory Visit | Attending: Physician Assistant | Admitting: Physician Assistant

## 2024-02-18 VITALS — BP 122/74 | HR 78 | Ht 74.0 in | Wt 291.0 lb

## 2024-02-18 DIAGNOSIS — I509 Heart failure, unspecified: Secondary | ICD-10-CM

## 2024-02-18 DIAGNOSIS — I25810 Atherosclerosis of coronary artery bypass graft(s) without angina pectoris: Secondary | ICD-10-CM

## 2024-02-18 DIAGNOSIS — I5023 Acute on chronic systolic (congestive) heart failure: Secondary | ICD-10-CM

## 2024-02-18 DIAGNOSIS — Z79899 Other long term (current) drug therapy: Secondary | ICD-10-CM

## 2024-02-18 DIAGNOSIS — E785 Hyperlipidemia, unspecified: Secondary | ICD-10-CM | POA: Diagnosis not present

## 2024-02-18 DIAGNOSIS — N184 Chronic kidney disease, stage 4 (severe): Secondary | ICD-10-CM

## 2024-02-18 DIAGNOSIS — I4811 Longstanding persistent atrial fibrillation: Secondary | ICD-10-CM | POA: Diagnosis not present

## 2024-02-18 DIAGNOSIS — I10 Essential (primary) hypertension: Secondary | ICD-10-CM

## 2024-02-18 DIAGNOSIS — I255 Ischemic cardiomyopathy: Secondary | ICD-10-CM

## 2024-02-18 MED ORDER — TORSEMIDE 20 MG PO TABS: 40.0000 mg | ORAL_TABLET | Freq: Two times a day (BID) | ORAL | 2 refills | Status: DC

## 2024-02-18 NOTE — Patient Instructions (Signed)
 Medication Instructions:  INCREASE TORSEMIDE TO 60 MG TWICE DAILY FOR 5 DAYS THEN RETURN TO TAKING 40 MG TWICE DAILY *If you need a refill on your cardiac medications before your next appointment, please call your pharmacy*   Lab Work: BMET IN 1 WEEK If you have labs (blood work) drawn today and your tests are completely normal, you will receive your results only by: MyChart Message (if you have MyChart) OR A paper copy in the mail If you have any lab test that is abnormal or we need to change your treatment, we will call you to review the results.   Testing/Procedures: NO TESTING   Follow-Up: At [facility], you and your health needs are our priority.  As part of our continuing mission to provide you with exceptional heart care, we have created designated Provider Care Teams.  These Care Teams include your primary Cardiologist (physician) and Advanced Practice Providers (APPs -  Physician Assistants and Nurse Practitioners) who all work together to provide you with the care you need, when you need it.  We recommend signing up for the patient portal called "MyChart".  Sign up information is provided on this After Visit Summary.  MyChart is used to connect with patients for Virtual Visits (Telemedicine).  Patients are able to view lab/test results, encounter notes, upcoming appointments, etc.  Non-urgent messages can be sent to your provider as well.   To learn more about what you can do with MyChart, go to [email or web address].    Your next appointment:   1-2 week(s)  Provider:   Azalee Course, PA  Other Instructions Needs an appointment with Heart Failure services within 3-4 weeks.

## 2024-02-18 NOTE — Progress Notes (Signed)
   02/18/2024  Patient ID: Connor Burns., male   DOB: 1956/07/27, 68 y.o.   MRN: [id number]    Reason for referral: Medication Management  Referral source:  [name], FNP  Reason for call: Comprehensive Medication Review  Outreach:  Unsuccessful telephone call attempt #1 to patient.   HIPAA compliant voicemail left requesting a return call  Plan:  -I will make another outreach attempt to patient within 3-4 business days.   Beecher Mcardle, PharmD, BCACP Clinical Pharmacist [phone]

## 2024-02-18 NOTE — Progress Notes (Incomplete)
Assessment/Plan:   Dementia of unclear etiology***  Connor Burns. is a very pleasant 68 y.o. RH male with a history ofhypertension, hyperlipidemia, PVD, Afib on Xarelto, s/p ICD, congestive cardiomyopathy, recent presentation to the hospital with CHF exacerbation, history of stroke 2006, arthritis, CKD3, gout, peripheral neuropathy,  prediabetes and a diagnosis of mild dementia of unclear etiology per neuropsych evaluation on Jan 2024 seen today in follow up for memory loss. Patient is currently on donepezil 10 mg daily.     Follow up in   months. Continue donepezil 10 mg daily, side effects discussed Continue B12 supplements Repeat neuropsych evaluation for diagnostic clarity and disease trajectory Recommend good control of her cardiovascular risk factors Continue to control mood as per PCP Monitor driving    Subjective:    This patient is accompanied in the office by his wife*** who supplements the history.  Previous records as well as any outside records available were reviewed prior to todays visit. Patient was last seen on 07/28/2023 with MMSE 25/30  . Any changes in memory since last visit? ".-Wife says.  He continues to forget conversations frequently which causes frustration.  He is not very active outside of the house, still does not participate in daily activities as before repeats oneself?  Endorsed by his wife Disoriented when walking into a room?  Patient denies ***  Leaving objects?  May misplace things but not in unusual places***  Wandering behavior?  denies   Any personality changes since last visit?  denies   Any worsening depression?:  Denies.   Hallucinations or paranoia?  Denies.   Seizures? denies    Any sleep changes?  Denies vivid dreams, REM behavior or sleepwalking   Sleep apnea?   Denies.   Any hygiene concerns? Denies.  Independent of bathing and dressing?  Endorsed  Does the patient needs help with medications?  Wife is in charge *** Who is in  charge of the finances?  Wife is in charge   *** Any changes in appetite?  Forgets that he eats and he eats again***   Patient have trouble swallowing? Denies.   Does the patient cook? No Any headaches?   denies   Chronic back pain  denies   Ambulates with difficulty? Denies.  *** Recent falls or head injuries? denies     Unilateral weakness, numbness or tingling? denies   Any tremors?  Denies   Any anosmia?  Denies   Any incontinence of urine?  Endorsed Any bowel dysfunction?   Denies      Patient lives with  *** Does the patient drive?  He forgets routes***   Initial visit 02/26/22 The patient is seen in neurologic consultation at the request of [name], [name], MD for the evaluation of memory.  The patient is accompanied by his daughter who supplements the history. This is a 68 y.o. year old RH  male who has had memory issues for about 2 years, initially noticed by his family.  He is good at "it was not bad, but since January it got worse ".  Since that time, he is repeating himself more, but attributes this as multitasking, "I may become more distracted than I forget ".  He denies being disoriented when walking into her room or leaving objects in unusual places.  He ambulates without difficulty, denies any falls or head injuries.  He works out at least 2 times a week.  He continues to drive, and denies getting lost.  He lives with his  wife and his youngest daughter.  His mood is good, he has moments in which she is more irritable "my father is a very passionate person, and he gets angry if he gets bothered ".  "He is not as bad as he used to be ".  He denies any history of depression or anxiety, hallucinations or paranoia.  He sleeps fairly well, denies vivid dreams or sleepwalking or REM behavior.  There are no hygiene concerns, he is independent of bathing and dressing.  Sometimes he forgets his medications.  There are no issues with his finances.  "I am on top of it ".  His appetite is not  great according to him, he is trying to control his sugar intake to maintain a better weight.  He denies any trouble swallowing.  He does not cook.  He denies any headaches,double vision, dizziness, no new focal numbness or tingling, unilateral weakness, tremors or anosmia. No history of seizures. Denies urine incontinence, retention, constipation or diarrhea.  Denies OSA, ETOH or Tobacco. Family History remarkable for maternal grandmother and sister "with some sort of dementia "     Neuropsych evaluation 12/30/22  Briefly, results suggested ongoing impairment surrounding executive functioning, confrontation naming, visuospatial abilities (outside of clock drawing), and all aspects of verbal learning and memory. While receptive language also represented a normative impairment, he missed very few items across this task, suggesting that this might represent more of a normative impairment than a clinical impairment. An additional weakness was exhibited across attention/concentration while performance variability was exhibited across processing speed. The etiology of his presentation is unclear as current testing patterns are fairly nonspecific in nature. His most recent neuroimaging which I was able to review is in the form of a head CT dated to 2013. 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 Given that no more recent scans could be located, I am unable to comment on any additional events over the past 10-11 years or the severity of underlying cerebrovascular disease which could be playing an active role in his current presentation. 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 Across these tasks, he did not benefit from repeated exposure to new information, was largely amnestic after brief delays, and performed poorly across yes/no recognition trials. This suggests concerns for rapid forgetting and a significant memory storage  impairment. While this could raise concerns for the presence of a neurodegenerative illness such as Alzheimer's disease (especially given additional impairment surrounding confrontation naming), visual memory remained very strong. While this is both unexpected and encouraging, continued monitoring will be important to see if cognitive impairments remain stable or if progressive decline over the next few years occurs.    PREVIOUS MEDICATIONS:   CURRENT MEDICATIONS:  Outpatient Encounter Medications as of 02/19/2024  Medication Sig   apixaban (ELIQUIS) 5 MG TABS tablet Take 1 tablet (5 mg total) by mouth 2 (two) times daily.   atorvastatin (LIPITOR) 20 MG tablet Take 1 tablet (20 mg total) by mouth daily.   carvedilol (COREG) 3.125 MG tablet Take 1 tablet (3.125 mg total) by mouth 2 (two) times daily.   cyanocobalamin (VITAMIN B12) 1000 MCG tablet Take 1 tablet (1,000 mcg total) by mouth daily.   donepezil (ARICEPT) 10 MG tablet Take 1 tablet (10 mg total) by mouth daily.   empagliflozin (JARDIANCE) 10 MG TABS tablet Take 1 tablet (10 mg total) by mouth daily.   isosorbide mononitrate (IMDUR) 30 MG 24 hr tablet Take 30  mg by mouth daily.   potassium chloride SA (KLOR-CON M) 20 MEQ tablet Take 60 mEq by mouth 2 (two) times daily.   silver sulfADIAZINE (SILVADENE) 1 % cream Apply 1 Application topically 2 (two) times daily. Clean site of wound and apply cream.   SV IRON 325 (65 Fe) MG tablet Take 325 mg by mouth 3 (three) times a week.   torsemide (DEMADEX) 20 MG tablet Take 40 mg by mouth 2 (two) times daily.   No facility-administered encounter medications on file as of 02/19/2024.       07/28/2023    5:00 PM  MMSE - Mini Mental State Exam  Orientation to time 3  Orientation to Place 5  Registration 3  Attention/ Calculation 4  Recall 1  Language- name 2 objects 2  Language- repeat 1  Language- follow 3 step command 3  Language- read & follow direction 1  Write a sentence 1  Copy design  1  Total score 25      03/18/2022    3:00 PM  Montreal Cognitive Assessment   Visuospatial/ Executive (0/5) 3  Naming (0/3) 3  Attention: Read list of digits (0/2) 2  Attention: Read list of letters (0/1) 1  Attention: Serial 7 subtraction starting at 100 (0/3) 3  Language: Repeat phrase (0/2) 1  Language : Fluency (0/1) 1  Abstraction (0/2) 1  Delayed Recall (0/5) 3  Orientation (0/6) 6  Total 24  Adjusted Score (based on education) 24    Objective:     PHYSICAL EXAMINATION:    VITALS:  There were no vitals filed for this visit.  GEN:  The patient appears stated age and is in NAD. HEENT:  Normocephalic, atraumatic.   Neurological examination:  General: NAD, well-groomed, appears stated age. Orientation: The patient is alert. Oriented to person, place and not to date Cranial nerves: There is good facial symmetry.The speech is fluent and clear. No aphasia or dysarthria. Fund of knowledge is appropriate. Recent and remote memory are impaired. Attention and concentration are reduced.  Able to name objects and repeat phrases.  Hearing is intact to conversational tone. *** Sensation: Sensation is intact to light touch throughout Motor: Strength is at least antigravity x4. DTR's 2/4 in UE/LE     Movement examination: Tone: There is normal tone in the UE/LE Abnormal movements:  no tremor.  No myoclonus.  No asterixis.   Coordination:  There is no decremation with RAM's. Normal finger to nose  Gait and Station: The patient has no*** difficulty arising out of a deep-seated chair without the use of the hands. The patient's stride length is good.  Gait is cautious and narrow.    Thank you for allowing [place] the opportunity to participate in the care of this nice patient. Please do not hesitate to contact us for any questions or concerns.   Total time spent on today's visit was *** minutes dedicated to this patient today, preparing to see patient, examining the patient, ordering  tests and/or medications and counseling the patient, documenting clinical information in the EHR or other health record, independently interpreting results and communicating results to the patient/family, discussing treatment and goals, answering patient's questions and coordinating care.  Cc:  [name], FNP  Marlowe Kays 02/18/2024 11:52 AM

## 2024-02-18 NOTE — Progress Notes (Unsigned)
 Cardiology Office Note:  .   Date:  02/19/2024  ID:  Connor Mina., DOB 1956/09/23, MRN [id number] PCP: [name], FNP  [facility] Providers Cardiologist:  Thomasene Ripple, DO Electrophysiologist:  Will Jorja Loa, MD     History of Present Illness: .   Connor Duvan Mousel. is a 68 y.o. male with PMH of CAD s/p CABG 2004 in [place], ICM s/p ICD, chronic systolic heart failure, longstanding persistent atrial fibrillation on Xarelto, CKD stage IV, history of DVT, PAD, dementia, morbid obesity, and hyperlipidemia.  He had repeat cardiac catheterization in 2015, report is not available.  Previous report suggested two of his vein grafts were occluded.  Echocardiogram in October 2018 showed EF 40 to 45%, grade 2 DD, akinesis of the inferior and inferoseptal myocardium.  Echo in 2019 showed EF 30 to 35%.  Repeat echocardiogram at outside hospital in March 2024 showed EF of 30 to 35%.  He had a stress test in March 2024 that showed large area of infarction in the inferior wall without reversible ischemia, LVEF 23%.  This was reviewed by Dr. Servando Salina who discussed with the patient various options and ultimately decided to defer heart cath due to poor renal function.  He was admitted to the hospital in September 2024 with acute respiratory failure secondary to acute on chronic systolic heart failure.  Echocardiogram obtained on 09/13/2023 showed EF 35 to 40%, regional wall motion abnormality, mild LVH, mildly reduced RVEF, RVSP 40.2 mmHg, small pericardial effusion, mild MR.  GDMT limited by renal function and blood pressure.  Discharge weight was 225 pounds.  Lasix was switched to torsemide.  He was seen by heart failure TOC clinic in November 2024 at which time he was doing well.  He is on carvedilol, Jardiance and Imdur.  More recently, he went to the emergency room on 02/03/2024 with shortness of breath.  He also had lower extremity wound that was treated with silver sulfadiazine.  He was noted to have  quite significant lower extremity edema and was instructed to follow-up with cardiology service.  BNP was 513.3.  Patient presents today for follow-up.  His weight today is 291 pounds, compared to his previous dry weight of 225 pounds, he is at least 50 pounds up or more.  He has bilateral lower extremity edema with weeping from the leg.  His abdomen is also swollen.  He has not been very compliant with his medication.  I asked him to be more compliant with taking the medication.  He has problem affording Eliquis and the Jardiance, we will fill out medication assistance form for him.  He is clearly volume overloaded, I initially recommended subcutaneous Lasix autoinjector in the office, however he is afraid of needles.  We eventually decided to increase torsemide to 60 mg twice a day for 5 days before going back to 40 mg twice a day thereafter.  He will need a basic metabolic panel in 1 week.  I plan to see the patient back in 1 to 2 weeks.  He will need to follow-up with heart failure service in 3 to 4 weeks.  He gets short of breath with exertion however no shortness of breath at rest.  O2 saturation is 97% on room air.  His lung is clear on exam.  ROS:   Patient has lower extremity edema, and dyspnea on exertion but no shortness of breath at rest.  He denies any chest pain.  Studies Reviewed: Marland Kitchen   EKG Interpretation Date/Time:  Wednesday February 18 2024 14:20:48 EST Ventricular Rate:  79 PR Interval:    QRS Duration:  96 QT Interval:  412 QTC Calculation: 472 R Axis:   9  Text Interpretation: Atrial fibrillation with premature ventricular or aberrantly conducted complexes Confirmed by Azalee Course [phone]) on 02/18/2024 2:22:46 PM    Cardiac Studies & Procedures   ______________________________________________________________________________________________     ECHOCARDIOGRAM  ECHOCARDIOGRAM COMPLETE 09/13/2023  Narrative ECHOCARDIOGRAM REPORT    Patient Name:   Connor Salm. Date  of Exam: 09/13/2023 Medical Rec #:  [id number]        Height:       74.0 in Accession #:    [id number]       Weight:       288.4 lb Date of Birth:  03-23-1956         BSA:          2.539 m Patient Age:    67 years         BP:           110/68 mmHg Patient Gender: M                HR:           75 bpm. Exam Location:  Inpatient  Procedure: 2D Echo, Cardiac Doppler, Color Doppler and Intracardiac Opacification Agent  Indications:    CHF-Acute Systolic I50.21  History:        Patient has prior history of Echocardiogram examinations, most recent 09/25/2017. Cardiomyopathy and CHF, CAD, ICD, Stroke, Arrythmias:Atrial Fibrillation, Signs/Symptoms:Alzheimer's; Risk Factors:Non-Smoker.  Sonographer:    Aron Baba Referring Phys: [id number]   Sonographer Comments: Patient is obese and Technically difficult study due to poor echo windows. IMPRESSIONS   1. Left ventricular ejection fraction, by estimation, is 35 to 40%. The left ventricle has moderately decreased function. The left ventricle demonstrates regional wall motion abnormalities (see scoring diagram/findings for description). There is mild left ventricular hypertrophy. Left ventricular diastolic parameters are indeterminate. 2. No formed LV mural thrombus noted with Definity contrast. 3. Right ventricular systolic function is mildly reduced. The right ventricular size is not well visualized. There is mildly elevated pulmonary artery systolic pressure. The estimated right ventricular systolic pressure is 40.2 mmHg. 4. A small pericardial effusion is present. The pericardial effusion is posterior to the left ventricle. 5. The mitral valve is degenerative. Mild mitral valve regurgitation. 6. The aortic valve is tricuspid. There is mild calcification of the aortic valve. Aortic valve regurgitation is not visualized. Aortic valve sclerosis is present, with no evidence of aortic valve stenosis. 7. Aortic dilatation noted. There is  borderline dilatation of the ascending aorta, measuring 37 mm. 8. The inferior vena cava is dilated in size with <50% respiratory variability, suggesting right atrial pressure of 15 mmHg.  Comparison(s): Prior images unable to be directly viewed.  FINDINGS Left Ventricle: Left ventricular ejection fraction, by estimation, is 35 to 40%. The left ventricle has moderately decreased function. The left ventricle demonstrates regional wall motion abnormalities. Definity contrast agent was given IV to delineate the left ventricular endocardial borders. The left ventricular internal cavity size was normal in size. There is mild left ventricular hypertrophy. Left ventricular diastolic parameters are indeterminate.   LV Wall Scoring: The mid and distal anterior septum and mid inferoseptal segment are hypokinetic. The entire lateral wall, basal anteroseptal segment, basal inferoseptal segment, and apex are normal.  Right Ventricle: The right ventricular size is not well visualized. Right vetricular wall thickness  was not well visualized. Right ventricular systolic function is mildly reduced. There is mildly elevated pulmonary artery systolic pressure. The tricuspid regurgitant velocity is 2.51 m/s, and with an assumed right atrial pressure of 15 mmHg, the estimated right ventricular systolic pressure is 40.2 mmHg.  Left Atrium: Left atrial size was normal in size.  Right Atrium: Right atrial size was normal in size.  Pericardium: A small pericardial effusion is present. The pericardial effusion is posterior to the left ventricle.  Mitral Valve: The mitral valve is degenerative in appearance. Mild mitral annular calcification. Mild mitral valve regurgitation.  Tricuspid Valve: The tricuspid valve is grossly normal. Tricuspid valve regurgitation is mild.  Aortic Valve: The aortic valve is tricuspid. There is mild calcification of the aortic valve. There is mild aortic valve annular calcification.  Aortic valve regurgitation is not visualized. Aortic valve sclerosis is present, with no evidence of aortic valve stenosis.  Pulmonic Valve: The pulmonic valve was grossly normal. Pulmonic valve regurgitation is trivial.  Aorta: The aortic root is normal in size and structure and aortic dilatation noted. There is borderline dilatation of the ascending aorta, measuring 37 mm.  Venous: The inferior vena cava is dilated in size with less than 50% respiratory variability, suggesting right atrial pressure of 15 mmHg.  IAS/Shunts: No atrial level shunt detected by color flow Doppler.   LEFT VENTRICLE PLAX 2D LVIDd:         5.20 cm   Diastology LVIDs:         4.50 cm   LV e' medial:    4.30 cm/s LV PW:         0.70 cm   LV E/e' medial:  26.5 LV IVS:        1.00 cm   LV e' lateral:   8.27 cm/s LVOT diam:     2.00 cm   LV E/e' lateral: 13.8 LV SV:         47 LV SV Index:   19 LVOT Area:     3.14 cm   RIGHT VENTRICLE RV S prime:     9.41 cm/s TAPSE (M-mode): 1.3 cm  LEFT ATRIUM           Index        RIGHT ATRIUM           Index LA diam:      4.30 cm 1.69 cm/m   RA Area:     23.70 cm LA Vol (A2C): 63.7 ml 25.09 ml/m  RA Volume:   80.50 ml  31.70 ml/m LA Vol (A4C): 63.1 ml 24.85 ml/m AORTIC VALVE LVOT Vmax:   88.80 cm/s LVOT Vmean:  55.800 cm/s LVOT VTI:    0.151 m  AORTA Ao Root diam: 3.30 cm Ao Asc diam:  3.70 cm  MITRAL VALVE                TRICUSPID VALVE MV Area (PHT): 4.71 cm     TR Peak grad:   25.2 mmHg MV Decel Time: 161 msec     TR Vmax:        251.00 cm/s MR Peak grad: 25.8 mmHg MR Mean grad: 36.0 mmHg     SHUNTS MR Vmax:      254.00 cm/s   Systemic VTI:  0.15 m MR Vmean:     286.0 cm/s    Systemic Diam: 2.00 cm MV E velocity: 114.00 cm/s  Nona Dell MD Electronically signed by Nona Dell MD Signature Date/Time: 09/13/2023/2:38:59 PM  Final           ______________________________________________________________________________________________      Risk Assessment/Calculations:             Physical Exam:   VS:  BP 122/74 (BP Location: Left Arm, Patient Position: Sitting, Cuff Size: Large)   Pulse 78   Ht 6\' 2"  (1.88 m)   Wt 291 lb (132 kg)   SpO2 97%   BMI 37.36 kg/m    Wt Readings from Last 3 Encounters:  02/18/24 291 lb (132 kg)  02/10/24 282 lb 12.8 oz (128.3 kg)  11/07/23 237 lb 2 oz (107.6 kg)    GEN: Well nourished, well developed in no acute distress NECK: No JVD; No carotid bruits CARDIAC: Irregularly irregular, no murmurs, rubs, gallops RESPIRATORY:  Clear to auscultation without rales, wheezing or rhonchi  ABDOMEN: Soft, non-tender, non-distended EXTREMITIES:  No edema; No deformity   ASSESSMENT AND PLAN: .     Acute on Chronic Systolic Heart Failure /ischemic cardiomyopathy Significant volume overload with weight gain of 50 pounds. Non-compliance with diuretic therapy.  Last echocardiogram in September 2024 showed EF 35 to 40%.  GDMT limited by renal function and blood pressure. -Suspect he is close to 50 pound volume overload due to noncompliance with medication. -I recommended subcutaneous Lasix autoinjector in the office because have higher bioavailability, however patient is afraid of needles. -Will increase Torsemide to 60mg  twice daily for 5 days, then revert to 40mg  twice daily.  -Despite massive volume overload, his lung is clear and he does not have PND. -I filled out medication assistance for Jardiance and Eliquis.  Chronic Kidney Disease (Stage 4) Creatinine 2.5, GFR 27. -Continue current management and monitor renal function closely. -Consider consultation with nephrology if diuresis remains ineffective due to renal impairment.  Longstanding persistent Atrial Fibrillation Non-compliance with Eliquis due to financial constraints. -Initiate medication assistance program for Eliquis.  CAD  s/p CABG: Denies any recent chest pain.  Hyperlipidemia: On Lipitor 20 mg daily       Dispo: Basic metabolic panel in 1 week.  I plan to see the patient back in 1 to 2 weeks, he need to follow-up with heart failure service in 3 to 4 weeks.  Signed, Azalee Course, PA

## 2024-02-19 ENCOUNTER — Telehealth: Payer: Self-pay | Admitting: Licensed Clinical Social Worker

## 2024-02-19 ENCOUNTER — Ambulatory Visit (HOSPITAL_COMMUNITY): Payer: Medicare Other | Attending: Nurse Practitioner

## 2024-02-19 ENCOUNTER — Ambulatory Visit: Payer: Medicare Other | Admitting: Physician Assistant

## 2024-02-19 NOTE — Telephone Encounter (Signed)
 H&V Care Navigation CSW Progress Note  Clinical Social Worker contacted patient by phone to f/u after appt yesterday. Note challenges with medication affordability and financial challenges. Left voicemail at [phone] (note this # is listed for his wife and him- DPR on file for wife). Will f/u again as able if no return call.   Patient is participating in a Managed Medicaid Plan:  No, U[employer] Medicare  SDOH Screenings   Food Insecurity: No Food Insecurity (09/15/2023)  Housing: Low Risk  (09/15/2023)  Transportation Needs: No Transportation Needs (09/12/2023)  Utilities: Not At Risk (09/12/2023)  Alcohol Screen: Low Risk  (09/15/2023)  Depression (PHQ2-9): Low Risk  (03/03/2023)  Financial Resource Strain: Medium Risk (09/15/2023)  Tobacco Use: Low Risk  (02/10/2024)    Connor Burns, MSW, LCSW Clinical Social Worker II [facility]Vascular Care Navigation  [phone]- work cell phone (preferred) [phone]- desk phone

## 2024-02-19 NOTE — Progress Notes (Signed)
 Heart and Vascular Care Navigation  02/19/2024  Rook Maue. [birth year] [id number]  Reason for Referral: challenges with medication costs/financial challenges  Patient is participating in a Managed Medicaid Plan: No, [employer]  Engaged with patient by telephone (pt wife Corrie Dandy called back from listed number, DPR on file) for initial visit for Heart and Vascular Care Coordination.                                                                                                   Assessment:                    LCSW received return call from pt wife Corrie Dandy. Introduced self, role, reason for call. Home address, PCP and emergency contacts confirmed. Pt is retired from the department of public safety. He only receives Tree surgeon at this time, his wife is still working at school system, and they live with adult daughter with disabilities who receives Social Security income as well. Note pt dx of dementia. Pt wife shares they are in the process of buying their home, no current past due housing/utility bills but will check on this again when we speak again.   Household income currently may qualify pt for medication assistance through manufacturer, discussed how to apply with each application (Eliquis and Jardiance) and what documents are needed. She understands that Eliquis requires that pt meet 3%OOP for household (can count her and her daughters pharmacy print outs as well as pts to be counted).   We also discussed several ways to save money including applying for SNAP benefits, using SHIIP counselors to find additional Medicare programs that pt may be eligible for and using assistance programs through DSS including LIEAP. Pt wife interested in any community resources we may have to offer at this time. No additional questions but requested that pt wife call me should she think of anything else after we hang up.                  HRT/VAS Care Coordination     Patients Home Cardiology  Office [facility]   Outpatient Care Team Social Worker   Living arrangements for the past 2 months Single Family Home   Lives with: Spouse   Patient Current Insurance Coverage Managed Medicare   Patient Has Concern With Paying Medical Bills No   Does Patient Have Prescription Coverage? Yes   Home Assistive Devices/Equipment None   HH Agency [facility]       Social History:                                                                             SDOH Screenings   Food Insecurity: Food Insecurity Present (02/19/2024)  Housing: Low Risk  (02/19/2024)  Transportation Needs: No Transportation Needs (  02/19/2024)  Utilities: Not At Risk (02/19/2024)  Alcohol Screen: Low Risk  (09/15/2023)  Depression (PHQ2-9): Low Risk  (03/03/2023)  Financial Resource Strain: Medium Risk (02/19/2024)  Tobacco Use: Low Risk  (02/10/2024)  Health Literacy: Adequate Health Literacy (02/19/2024)    SDOH Interventions: Financial Resources:  Surveyor, quantity Strain Interventions: Programmer, applications Provided DSS for financial assistance; senior resources for [employer] counseling  Food Insecurity:  Food Insecurity Interventions: [employer] Provided- Environmental manager, referral to [employer]   Housing Insecurity:  Housing Interventions: Intervention Not Indicated  Transportation:   Transportation Interventions: Intervention Not Indicated    Other Care Navigation Interventions:     Provided Pharmacy assistance resources  Pt was given [employer] application and    Follow-up plan:   LCSW sent pt and pt wife the following information: my card, [employer] card to apply for [employer], [employer] information and information about [employer]. I also sent additional resources for in home care, support groups for families and patients living with memory loss/dementia dx. Will f/u to make sure these are received and f/u with VBCI team LCSW [facility] who has f/u  scheduled with pt.

## 2024-02-23 DIAGNOSIS — I129 Hypertensive chronic kidney disease with stage 1 through stage 4 chronic kidney disease, or unspecified chronic kidney disease: Secondary | ICD-10-CM | POA: Diagnosis not present

## 2024-02-23 DIAGNOSIS — D631 Anemia in chronic kidney disease: Secondary | ICD-10-CM | POA: Diagnosis not present

## 2024-02-23 DIAGNOSIS — I5020 Unspecified systolic (congestive) heart failure: Secondary | ICD-10-CM | POA: Diagnosis not present

## 2024-02-23 DIAGNOSIS — N2581 Secondary hyperparathyroidism of renal origin: Secondary | ICD-10-CM | POA: Diagnosis not present

## 2024-02-23 DIAGNOSIS — N1832 Chronic kidney disease, stage 3b: Secondary | ICD-10-CM | POA: Diagnosis not present

## 2024-02-23 DIAGNOSIS — I482 Chronic atrial fibrillation, unspecified: Secondary | ICD-10-CM | POA: Diagnosis not present

## 2024-02-25 ENCOUNTER — Ambulatory Visit: Payer: Self-pay

## 2024-02-25 LAB — LAB REPORT - SCANNED
Albumin, Urine POC: 119.7
Creatinine, POC: 131.8 mg/dL
EGFR: 33
Microalb Creat Ratio: 91

## 2024-02-25 NOTE — Patient Outreach (Signed)
 Care Coordination   02/25/2024 Name: Connor Burns. MRN: [id number] DOB: May 23, 1956   Care Coordination Outreach Attempts:  An unsuccessful outreach was attempted for an appointment today.  Follow Up Plan:  Additional outreach attempts will be made to offer the patient complex care management information and services.   Encounter Outcome:  No Answer   Care Coordination Interventions:  No, not indicated    Delsa Sale RN BSN CCM [place]  [facility], [facility] Health Nurse Care Coordinator  Direct Dial: [phone] Website: [email or web address]

## 2024-02-25 NOTE — Patient Outreach (Signed)
 Care Coordination   Initial Visit Note   02/25/2024 Name: Connor Burns. MRN: [id number] DOB: March 22, 1956  [name] is a 68 y.o. year old male who sees [name], FNP for primary care. I spoke with patient's wife Tramayne Sebesta by phone today.  What matters to the patients health and wellness today?  Wife would like to get assistance with cost of Eliquis and Jardiance.     Goals Addressed             This Visit's Progress    To improve Self-Management of CHF       Care Coordination Interventions: Basic overview and discussion of pathophysiology of Heart Failure reviewed Provided education on low sodium diet Reviewed Heart Failure Action Plan in depth and provided written copy Assessed need for readable accurate scales in home Provided education about placing scale on hard, flat surface Advised patient to weigh each morning after emptying bladder Discussed importance of daily weight and advised patient to weigh and record daily Reviewed role of diuretics in prevention of fluid overload and management of heart failure; Discussed the importance of keeping all appointments with provider Collaborated with [name], FNP to confirm patient should be wearing his oxygen at 2 lts during ambulation and at bedtime  Sent in basket message to Nunzio Cobbs Pharm D marked high priority, advising patient will run out of Jardiance and Eliquis, wife is able to afford to refill these medications Reviewed and discussed with wife, patient's next upcoming scheduled Cardiology follow up scheduled for 03/09/24 @2 :20 PM Discussed plans with patient for ongoing care coordination follow up and provided patient with direct contact information for nurse care coordinator Scheduled next upcoming nurse call with patient's wife on 03/10/24 @3 /19/25 @1 :00 PM       Interventions Today    Flowsheet Row Most Recent Value  Chronic Disease   Chronic disease during today's visit Congestive Heart Failure  (CHF), Other  [open wounds]  General Interventions   General Interventions Discussed/Reviewed General Interventions Discussed, General Interventions Reviewed, Doctor Visits, Labs, Durable Medical Equipment (DME), Communication with  Doctor Visits Discussed/Reviewed Doctor Visits Discussed, Doctor Visits Reviewed, PCP, Specialist  Durable Medical Equipment (DME) Oxygen  Communication with PCP/Specialists, Pharmacists  [name] FNP,  Grace Isaac D]  Exercise Interventions   Exercise Discussed/Reviewed Physical Activity, Assistive device use and maintanence  Physical Activity Discussed/Reviewed Physical Activity Reviewed, Physical Activity Discussed  Education Interventions   Education Provided Provided Education, Provided Printed Education  Provided Verbal Education On Nutrition, Labs, Medication, When to see the doctor  Nutrition Interventions   Nutrition Discussed/Reviewed Nutrition Discussed, Nutrition Reviewed, Decreasing salt  Pharmacy Interventions   Pharmacy Dicussed/Reviewed Pharmacy Topics Discussed, Pharmacy Topics Reviewed, Medications and their functions, Medication Adherence, Affording Medications  Medication Adherence Unable to refill medication  [facility] sent to Nunzio Cobbs Pharm D]          SDOH assessments and interventions completed:  No     Care Coordination Interventions:  Yes, provided   Follow up plan: Referral made to Nunzio Cobbs Pharm D to assist with medication cost/samples for Eliquis and Jardiance Follow up call scheduled for 03/10/24 @1 :00 PM    Encounter Outcome:  Patient Visit Completed

## 2024-02-26 ENCOUNTER — Telehealth: Payer: Self-pay | Admitting: Pharmacist

## 2024-02-26 DIAGNOSIS — Z5986 Financial insecurity: Secondary | ICD-10-CM

## 2024-02-26 NOTE — Patient Instructions (Signed)
 Visit Information  Thank you for taking time to visit with me today. Please don't hesitate to contact me if I can be of assistance to you.   Following are the goals we discussed today:   Goals Addressed             This Visit's Progress    To improve Self-Management of CHF       Care Coordination Interventions: Basic overview and discussion of pathophysiology of Heart Failure reviewed Provided education on low sodium diet Reviewed Heart Failure Action Plan in depth and provided written copy Assessed need for readable accurate scales in home Provided education about placing scale on hard, flat surface Advised patient to weigh each morning after emptying bladder Discussed importance of daily weight and advised patient to weigh and record daily Reviewed role of diuretics in prevention of fluid overload and management of heart failure; Discussed the importance of keeping all appointments with provider Collaborated with [name], FNP to confirm patient should be wearing his oxygen at 2 lts during ambulation and at bedtime  Sent in basket message to Nunzio Cobbs Pharm D marked high priority, advising patient will run out of Jardiance and Eliquis, wife is able to afford to refill these medications Reviewed and discussed with wife, patient's next upcoming scheduled Cardiology follow up scheduled for 03/09/24 @2 :20 PM Discussed plans with patient for ongoing care coordination follow up and provided patient with direct contact information for nurse care coordinator Scheduled next upcoming nurse call with patient's wife on 03/10/24 @3 /19/25 @1 :00 PM           Our next appointment is by telephone on 03/10/24 at 1:00 PM  Please call the care guide team at [phone] if you need to cancel or reschedule your appointment.   If you are experiencing a Mental Health or Behavioral Health Crisis or need someone to talk to, please call 1-800-273-TALK (toll free, 24 hour hotline)  The patient  verbalized understanding of instructions, educational materials, and care plan provided today and DECLINED offer to receive copy of patient instructions, educational materials, and care plan.   Delsa Sale RN BSN CCM [place]  [facility], [facility] Health Nurse Care Coordinator  Direct Dial: [phone] Website: [email or web address]

## 2024-02-26 NOTE — Progress Notes (Signed)
 02/26/2024 Name: Connor Burns. MRN: [id number] DOB: 1956-10-24  Chief Complaint  Patient presents with   Medication Assistance    Eliquis and Jardiance    Connor Burns. is a 68 y.o. year old male who presented for a telephone visit.   They were referred to the pharmacist by their Case Management Team  for assistance in managing medication access.    Subjective:  Care Team: Primary Care Provider: [name], FNP ; Next Scheduled Visit: N/A Cardiology:  03/09/2024  Medication Access/Adherence  Current Pharmacy:  [facility] [address], [place] - [address] [facility] [place] [address] Phone: [phone] Fax: [phone]  [facility] [address] [place] [address] Phone: [phone] Fax: [phone]   Patient reports affordability concerns with their medications: Yes  Patient reports access/transportation concerns to their pharmacy: No  Patient reports adherence concerns with their medications:  Yes  Due to Cost   Spoke with [name]. HIPAA identifiers were obtained.  She said the Patient was having difficulty affording Eliquis and Jardiance. After researching his insurance, it became clear that he has a $255 deductible after meeting-- Jardiance and Eliquis would have a $47 copays respectively.    [name] said he has not met his deductible at this time and the does not have the funds to do so.  She said they were already in the process of completing patient assistance applications.  After review of the chart, it is not clear who sent the forms to the patient.        Objective:  Lab Results  Component Value Date   HGBA1C 5.8 (H) 02/11/2023    Lab Results  Component Value Date   CREATININE 2.56 (H) 02/10/2024   BUN 28 (H) 02/10/2024   NA 142 02/10/2024   K 4.0 02/10/2024   CL 99 02/10/2024   CO2 25 02/10/2024    Lab Results  Component Value Date   CHOL 135 08/11/2017    HDL 38 (L) 08/11/2017   LDLCALC 83 08/11/2017   TRIG 68 08/11/2017   CHOLHDL 3.6 08/11/2017    Medications Reviewed Today     Reviewed by Beecher Mcardle, RPH (Pharmacist) on 02/26/24 at 1353  Med List Status: <None>   Medication Order Taking? Sig Documenting Provider Last Dose Status Informant  apixaban (ELIQUIS) 5 MG TABS tablet [id number] Yes Take 1 tablet (5 mg total) by mouth 2 (two) times daily. Regan Lemming, MD Taking Active   atorvastatin (LIPITOR) 20 MG tablet [id number] Yes Take 1 tablet (20 mg total) by mouth daily. Lonia Blood, MD Taking Active   carvedilol (COREG) 3.125 MG tablet [id number] Yes Take 1 tablet (3.125 mg total) by mouth 2 (two) times daily. Lonia Blood, MD Taking Active   cyanocobalamin (VITAMIN B12) 1000 MCG tablet [id number] Yes Take 1 tablet (1,000 mcg total) by mouth daily. [name], FNP Taking Active Self, Spouse/Significant Other  donepezil (ARICEPT) 10 MG tablet [id number] Yes Take 1 tablet (10 mg total) by mouth daily. Marcos Eke, PA-C Taking Active Self, Spouse/Significant Other  empagliflozin (JARDIANCE) 10 MG TABS tablet [id number] Yes Take 1 tablet (10 mg total) by mouth daily. Lonia Blood, MD Taking Active   isosorbide mononitrate (IMDUR) 30 MG 24 hr tablet [id number] Yes Take 30 mg by mouth daily. Patient is taking 1/2 tablet daily per instructions on his prescription bottle [provider] Taking Active   potassium chloride SA (  KLOR-CON M) 20 MEQ tablet [id number] Yes Take 60 mEq by mouth 2 (two) times daily. [provider] Taking Active   silver sulfADIAZINE (SILVADENE) 1 % cream [id number] Yes Apply 1 Application topically 2 (two) times daily. Clean site of wound and apply cream. Bing Neighbors, NP Taking Active   SV IRON 325 (65 Fe) MG tablet [id number] Yes Take 325 mg by mouth 3 (three) times a week. [provider] Taking Active Self, Spouse/Significant Other  torsemide (DEMADEX) 20 MG  tablet [id number] Yes Take 2 tablets (40 mg total) by mouth 2 (two) times daily. Azalee Course, [place] Taking Active               Assessment/Plan:   Requested Jardiance samples from [name], FNP  She said she would leave some for the Patient up front. Sent a message back to the Patient's Care Management Nurse, Delsa Sale, RN Asked the [name] to reach out to Cardiology about getting samples of both Jardiance and Eliquis.  Patient most likely will qualify to receive Jardiance through [employer]'s Program but has not spent the required 3% of his income in medication expenses to qualify for Eliquis.  Follow Up Plan:    Will follow up with the Patient in 2-3 business days about patient assistance paperwork and the availability of samples. Called Patient back and left a message about the Jardiance samples   Beecher Mcardle, PharmD, [facility] Clinical Pharmacist [phone]

## 2024-02-27 ENCOUNTER — Telehealth: Payer: Self-pay | Admitting: Pharmacist

## 2024-02-27 ENCOUNTER — Telehealth: Payer: Self-pay | Admitting: Cardiology

## 2024-02-27 ENCOUNTER — Encounter: Payer: Self-pay | Admitting: Licensed Clinical Social Worker

## 2024-02-27 ENCOUNTER — Ambulatory Visit: Payer: Self-pay

## 2024-02-27 ENCOUNTER — Telehealth: Payer: Self-pay | Admitting: Licensed Clinical Social Worker

## 2024-02-27 DIAGNOSIS — Z5986 Financial insecurity: Secondary | ICD-10-CM

## 2024-02-27 MED ORDER — EMPAGLIFLOZIN 10 MG PO TABS: 10.0000 mg | ORAL_TABLET | Freq: Every day | ORAL | Status: DC

## 2024-02-27 MED ORDER — APIXABAN 5 MG PO TABS: 5.0000 mg | ORAL_TABLET | Freq: Two times a day (BID) | ORAL | Status: DC

## 2024-02-27 NOTE — Telephone Encounter (Signed)
 LVM for pt to pick up samples on Monday, 03/01/24; one box Eliquis (14 tablets) and 2 boxes Jardiance (14 tablets total) left in brown bag at front desk in locked cabinet.

## 2024-02-27 NOTE — Patient Outreach (Signed)
 Care Coordination   02/27/2024 Name: Connor Burns. MRN: [id number] DOB: 1956-05-07   Care Coordination Outreach Attempts:  An unsuccessful outreach was attempted for an appointment today.  Follow Up Plan:  Additional outreach attempts will be made to offer the patient complex care management information and services.   Encounter Outcome:  No Answer   Care Coordination Interventions:  No, not indicated    Jenel Lucks, LCSW   [facility], [facility] Clinical Social Worker Direct Dial: [phone]  Fax: [phone] Website: [email or web address] 5:18 PM

## 2024-02-27 NOTE — Telephone Encounter (Signed)
 Ok to give samples, Eliquis 5 mg is correct dose.

## 2024-02-27 NOTE — Progress Notes (Addendum)
   02/27/2024  Patient ID: [name], male   DOB: [birth year], 68 y.o.   MRN: [id number]    Reason for referral: Medication Assistance  Referral source: [facility] Utilization Management Department  Reason for call: To follow up on samples.   Outreach:  Unsuccessful telephone call attempt #1 to patient.   Unable to leave message  Plan:  -I will make another outreach attempt to patient in 3-5 business days.    Beecher Mcardle, PharmD, BCACP Clinical Pharmacist [phone]   ADDENDUM: 02/27/2024  [name] called me back 02/27/2024 and said would pick up the Jardiance samples from [facility] and that she had a call into Cardiology.  Beecher Mcardle, PharmD, BCACP Clinical Pharmacist [phone]

## 2024-02-27 NOTE — Telephone Encounter (Signed)
 Pt c/o medication issue:  1. Name of Medication:  Eliquis   2. How are you currently taking this medication (dosage and times per day)?   3. Are you having a reaction (difficulty breathing--STAT)?   4. What is your medication issue?  Wife states samples of Eliquis were left at the front desk for patient. She just wants to confirm they will be available for pickup this afternoon.

## 2024-02-27 NOTE — Telephone Encounter (Signed)
  Patient calling the office for samples of medication:   1.  What medication and dosage are you requesting samples for? Jardiance  2.  Are you currently out of this medication? Yes

## 2024-02-27 NOTE — Patient Outreach (Signed)
 Care Coordination   02/27/2024 Name: Connor Burns. MRN: [id number] DOB: 05/31/1956   Care Coordination Outreach Attempts:  An unsuccessful outreach was attempted for an appointment today.  Follow Up Plan:  Additional outreach attempts will be made to offer the patient complex care management information and services.   Encounter Outcome:  No Answer   Care Coordination Interventions:  No, not indicated    Delsa Sale RN BSN CCM   [facility], [facility] Nurse Care Coordinator  Direct Dial: [phone] Website: [email or web address]

## 2024-02-27 NOTE — Telephone Encounter (Signed)
 Called and spoke to pt's wife, Corrie Dandy.  Pt is out of both Eliquis and Jardiance, and they are requesting samples of both meds. Will route to PharmD for approval.

## 2024-03-03 ENCOUNTER — Telehealth (HOSPITAL_COMMUNITY): Payer: Self-pay

## 2024-03-03 ENCOUNTER — Telehealth: Payer: Self-pay | Admitting: Licensed Clinical Social Worker

## 2024-03-03 NOTE — Telephone Encounter (Signed)
 H&V Care Navigation CSW Progress Note  Clinical Social Worker contacted caregiver by phone (pt wife Corrie Dandy, [phone]) to f/u on assistance. Pt may be eligible to apply for [employer] grant which could assist with meeting OOP for his Medicare plan/lowering Eliquis costs. I had also sent additional patient assistance through to home address.   No answer this morning at [phone]. Left voicemail requesting return call. Will re-attempt again tomorrow.   Patient is participating in a Managed Medicaid Plan:  No, UHC Medicare only  SDOH Screenings   Food Insecurity: Food Insecurity Present (02/19/2024)  Housing: Low Risk  (02/19/2024)  Transportation Needs: No Transportation Needs (02/19/2024)  Utilities: Not At Risk (02/19/2024)  Alcohol Screen: Low Risk  (09/15/2023)  Depression (PHQ2-9): Low Risk  (03/03/2023)  Financial Resource Strain: Medium Risk (02/19/2024)  Tobacco Use: Low Risk  (02/10/2024)  Health Literacy: Adequate Health Literacy (02/19/2024)    Octavio Graves, MSW, LCSW Clinical Social Worker II [facility] Health Heart/Vascular Care Navigation  [phone]- work cell phone (preferred) [phone]- desk phone

## 2024-03-03 NOTE — Telephone Encounter (Signed)
 Attempted to contact the patient to schedule VAS [place].  No answer.  Left message.  Second Attempt. Provided direct contact number for scheduling: [phone].

## 2024-03-04 ENCOUNTER — Telehealth: Payer: Self-pay | Admitting: Licensed Clinical Social Worker

## 2024-03-04 MED ORDER — EMPAGLIFLOZIN 10 MG PO TABS: 10.0000 mg | ORAL_TABLET | Freq: Every day | ORAL | Status: DC

## 2024-03-04 MED ORDER — APIXABAN 5 MG PO TABS: 5.0000 mg | ORAL_TABLET | Freq: Two times a day (BID) | ORAL | Status: DC

## 2024-03-04 NOTE — Telephone Encounter (Signed)
 H&V Care Navigation CSW Progress Note  Clinical Social Worker contacted caregiver by phone (pt wife Corrie Dandy, [phone]) to f/u on assistance, no answer again today. Left 2nd voicemail. Pt may be eligible to apply for [employer] grant which could assist with meeting OOP for his Medicare plan/lowering Eliquis costs. I had also sent additional patient assistance through to home address. Will re-attempt again as able  Patient is participating in a Managed Medicaid Plan:  No, [facility] Medicare   SDOH Screenings   Food Insecurity: Food Insecurity Present (02/19/2024)  Housing: Low Risk  (02/19/2024)  Transportation Needs: No Transportation Needs (02/19/2024)  Utilities: Not At Risk (02/19/2024)  Alcohol Screen: Low Risk  (09/15/2023)  Depression (PHQ2-9): Low Risk  (03/03/2023)  Financial Resource Strain: Medium Risk (02/19/2024)  Tobacco Use: Low Risk  (02/10/2024)  Health Literacy: Adequate Health Literacy (02/19/2024)    Octavio Graves, MSW, LCSW Clinical Social Worker II [facility] Health Heart/Vascular Care Navigation  [phone]- work cell phone (preferred) [phone]- desk phone

## 2024-03-05 ENCOUNTER — Telehealth (HOSPITAL_BASED_OUTPATIENT_CLINIC_OR_DEPARTMENT_OTHER): Payer: Self-pay | Admitting: Licensed Clinical Social Worker

## 2024-03-05 NOTE — Telephone Encounter (Signed)
 H&V Care Navigation CSW Progress Note  Clinical Social Worker contacted caregiver by phone (pt wife Corrie Dandy, [phone]) to f/u on assistance, no answer again today. Left 3rd voicemail. Pt may be eligible to apply for [employer] grant which could assist with meeting OOP for his Medicare plan/lowering Eliquis costs. I had also sent additional patient assistance through to home address. Will attempt to connect pt with pharmacy team next week for [facility] grant.   Patient is participating in a Managed Medicaid Plan:  No, [employer]  SDOH Screenings   Food Insecurity: Food Insecurity Present (02/19/2024)  Housing: Low Risk  (02/19/2024)  Transportation Needs: No Transportation Needs (02/19/2024)  Utilities: Not At Risk (02/19/2024)  Alcohol Screen: Low Risk  (09/15/2023)  Depression (PHQ2-9): Low Risk  (03/03/2023)  Financial Resource Strain: Medium Risk (02/19/2024)  Tobacco Use: Low Risk  (02/10/2024)  Health Literacy: Adequate Health Literacy (02/19/2024)    Octavio Graves, MSW, LCSW Clinical Social Worker II [facility] Health Heart/Vascular Care Navigation  [phone]- work cell phone (preferred) [phone]- desk phone

## 2024-03-08 ENCOUNTER — Ambulatory Visit: Payer: Medicare Other | Admitting: Physician Assistant

## 2024-03-09 ENCOUNTER — Ambulatory Visit: Payer: Medicare Other | Attending: Physician Assistant | Admitting: Physician Assistant

## 2024-03-09 ENCOUNTER — Telehealth: Payer: Self-pay | Admitting: Licensed Clinical Social Worker

## 2024-03-09 NOTE — Telephone Encounter (Signed)
 H&V Care Navigation CSW Progress Note  Clinical Social Worker  notes that pt no showed appt  today. I have attempted pt/pt wife multiple times with no response and sent additional resources. Remain available should pt/pt family re-engage with care navigation team.   Patient is participating in a Managed Medicaid Plan:  No UHC Medicare only  SDOH Screenings   Food Insecurity: Food Insecurity Present (02/19/2024)  Housing: Low Risk  (02/19/2024)  Transportation Needs: No Transportation Needs (02/19/2024)  Utilities: Not At Risk (02/19/2024)  Alcohol Screen: Low Risk  (09/15/2023)  Depression (PHQ2-9): Low Risk  (03/03/2023)  Financial Resource Strain: Medium Risk (02/19/2024)  Tobacco Use: Low Risk  (02/10/2024)  Health Literacy: Adequate Health Literacy (02/19/2024)    Octavio Graves, MSW, LCSW Clinical Social Worker II [facility] Health Heart/Vascular Care Navigation  [phone]- work cell phone (preferred) [phone]- desk phone

## 2024-03-10 ENCOUNTER — Ambulatory Visit: Payer: Self-pay

## 2024-03-10 ENCOUNTER — Telehealth: Payer: Self-pay | Admitting: Pharmacist

## 2024-03-10 DIAGNOSIS — I4821 Permanent atrial fibrillation: Secondary | ICD-10-CM

## 2024-03-10 DIAGNOSIS — I110 Hypertensive heart disease with heart failure: Secondary | ICD-10-CM | POA: Diagnosis not present

## 2024-03-10 DIAGNOSIS — R0602 Shortness of breath: Secondary | ICD-10-CM | POA: Diagnosis not present

## 2024-03-10 DIAGNOSIS — I5022 Chronic systolic (congestive) heart failure: Secondary | ICD-10-CM | POA: Diagnosis not present

## 2024-03-10 NOTE — Progress Notes (Signed)
 This encounter was created in error - please disregard.

## 2024-03-10 NOTE — Progress Notes (Signed)
   03/10/2024  Patient ID: Connor Burns., male   DOB: 04-17-1956, 68 y.o.   MRN: [id number]    Patient was called to follow up on medication assistance with Eliquis and to follow up on his missed appointment with Cardiology.  Unfortunately, the Patient's voicemail was full and I could not leave a message.  I was able to secure Jardiance samples for the Patient through the PCP that his wife picked up.    Follow up:  Call Patient back in 1-2 weeks.  Beecher Mcardle, PharmD, BCACP Clinical Pharmacist [phone]

## 2024-03-10 NOTE — Patient Outreach (Signed)
 Care Coordination   03/10/2024 Name: Connor Burns. MRN: [id number] DOB: Mar 22, 1956   Care Coordination Outreach Attempts:  An unsuccessful outreach was attempted for an appointment today.  Follow Up Plan:  Additional outreach attempts will be made to offer the patient complex care management information and services.   Encounter Outcome:  No Answer   Care Coordination Interventions:  No, not indicated    Delsa Sale RN BSN CCM [place]  [facility], [facility] Nurse Care Coordinator  Direct Dial: [phone] Website: [email or web address]

## 2024-03-12 ENCOUNTER — Telehealth: Payer: Self-pay | Admitting: *Deleted

## 2024-03-12 NOTE — Progress Notes (Signed)
 Complex Care Management Care Guide Note  03/12/2024 Name: Connor Burns. MRN: [id number] DOB: 20-Sep-1956  Connor Burns. is a 68 y.o. year old male who is a primary care patient of [name], FNP and is actively engaged with the care management team. I reached out to Connor Burns. by phone today to assist with re-scheduling  with the Licensed Clinical Social Worker.  Follow up plan: Unsuccessful telephone outreach attempt made. A HIPAA compliant phone message was left for the patient providing contact information and requesting a return call.  Gwenevere Ghazi  [facility]  [facility]Based [facility], [facility] Guide  Direct Dial: [phone]  Fax [phone]

## 2024-03-15 NOTE — Progress Notes (Unsigned)
 Complex Care Management Care Guide Note  03/15/2024 Name: Connor Burns. MRN: [id number] DOB: 1956/06/11  Connor Mina. is a 68 y.o. year old male who is a primary care patient of [name], FNP and is actively engaged with the care management team. I reached out to Connor Mina. by phone today to assist with re-scheduling  with the Licensed Clinical Social Worker.  Follow up plan: Unsuccessful telephone outreach attempt made. A HIPAA compliant phone message was left for the patient providing contact information and requesting a return call.  Gwenevere Ghazi  [facility]  Value[facility]Based Care Institute, [facility] Guide  Direct Dial: [phone]  Fax [phone]

## 2024-03-16 ENCOUNTER — Telehealth (HOSPITAL_COMMUNITY): Payer: Self-pay | Admitting: *Deleted

## 2024-03-16 NOTE — Telephone Encounter (Signed)
 Thank you Para March, @ [facility] Please call wife to get him the number to get scheduled.

## 2024-03-16 NOTE — Telephone Encounter (Signed)
 Called patient and left message reminding patient of appointment tomorrow in [facility] at 3:30.  Asked patient to call us at [phone] if he needs to cancel appointment.

## 2024-03-16 NOTE — Telephone Encounter (Signed)
 Attempted to contact the patient to schedule VAS [place].  No answer.  Left message.  Third Attempt. Provided direct contact number for scheduling: [phone].

## 2024-03-17 ENCOUNTER — Encounter (HOSPITAL_COMMUNITY): Payer: Medicare Other

## 2024-03-17 ENCOUNTER — Telehealth: Payer: Self-pay | Admitting: Pharmacist

## 2024-03-17 ENCOUNTER — Encounter: Payer: Self-pay | Admitting: Pharmacist

## 2024-03-17 DIAGNOSIS — I4821 Permanent atrial fibrillation: Secondary | ICD-10-CM

## 2024-03-17 NOTE — Progress Notes (Signed)
   03/17/2024  Patient ID: Connor Mina., male   DOB: [birth year], 68 y.o.   MRN: [id number]  T  Reason for referral: Medication Assistance  Reason for call: medication assistance with Jardiance and Eliquis  Outreach:  Unsuccessful telephone call attempt #3 to patient.   HIPAA compliant voicemail left requesting a return call  Plan:  I will close the Pharmacy case as I have been unable to reach the patient.  Beecher Mcardle, PharmD, BCACP Clinical Pharmacist [phone]

## 2024-03-17 NOTE — Progress Notes (Signed)
 Complex Care Management Care Guide Note  03/17/2024 Name: Carmino Ocain. MRN: [id number] DOB: 1956/09/16  [name] is a 68 y.o. year old male who is a primary care patient of [name], FNP and is actively engaged with the care management team. I reached out to [name] by phone today to assist with re-scheduling  with the Licensed Clinical Social Worker.  Follow up plan: Unsuccessful telephone outreach attempt made. A HIPAA compliant phone message was left for the patient providing contact information and requesting a return call.No further outreach attempts will be made at this time.   Gwenevere Ghazi  [facility]  Value-Based Care Institute, [facility] Guide  Direct Dial: [phone]  Fax [phone]

## 2024-03-17 NOTE — Progress Notes (Signed)
error 

## 2024-03-30 ENCOUNTER — Encounter: Payer: Self-pay | Admitting: Internal Medicine

## 2024-03-30 ENCOUNTER — Ambulatory Visit: Payer: Medicare Other | Admitting: Internal Medicine

## 2024-03-30 VITALS — BP 136/70 | HR 69 | Ht 74.0 in | Wt 195.0 lb

## 2024-03-30 DIAGNOSIS — R0602 Shortness of breath: Secondary | ICD-10-CM

## 2024-03-30 DIAGNOSIS — I5022 Chronic systolic (congestive) heart failure: Secondary | ICD-10-CM | POA: Diagnosis not present

## 2024-03-30 NOTE — Patient Instructions (Addendum)
 VISIT SUMMARY:  Today, you were seen for your worsening shortness of breath. We discussed your history of congestive heart failure and the challenges you have been facing with your current oxygen therapy. We also reviewed your medication regimen and the importance of adherence to your prescribed treatments.  YOUR PLAN:  -DYSPNEA: Dyspnea means difficulty breathing. Your shortness of breath is likely due to your heart failure rather than a lung problem. We will order pulmonary function tests to rule out any primary lung issues and coordinate with NP [name] to get you a portable oxygen concentrator (POC) to help with your mobility.  -CONGESTIVE HEART FAILURE (CHF): Congestive heart failure means your heart is not pumping blood as well as it should. This is contributing to your shortness of breath and deconditioning. It is important to exercise regularly and take your medications, including torsemide, carvedilol, and atorvastatin, as prescribed. We will monitor you for signs of fluid retention and follow up with your cardiologist.  -LEG EDEMA AND SKIN CHANGES: Leg edema means swelling in your legs, which is likely due to fluid retention from your heart failure. You should apply moisturizing agents to manage dry skin and continue using Silvadene on any wounds.  INSTRUCTIONS:  Please follow up with your cardiologist as planned. Ensure you are taking your medications as prescribed and monitor for any signs of fluid retention. Please call [name] to have her prescribe a portable oxygen concentrator since she has already prescribed your oxygen therapy. Additionally, we will order pulmonary function tests to further evaluate your breathing issues.

## 2024-03-30 NOTE — Progress Notes (Signed)
 Connor Burns    [id number]    12/20/1956  Primary Care Physician:Moore, Lolita Cram, FNP  Referring Physician: [name], FNP [address] [place],  [place] [address] Reason for Consultation: shortness of breath Date of Consultation: 03/30/2024  Chief complaint:   Chief Complaint  Patient presents with   Consult    Having sob. Started      HPI:  Discussed the use of AI scribe software for clinical note transcription with the patient, who gave verbal consent to proceed.  History of Present Illness Connor Burns. is a 68 year old male with congestive heart failure who presents with shortness of breath. He is accompanied by his wife. He was referred by [name] for evaluation of his breathing difficulties.  His past medical history includes congestive heart failure with a reduced ejection fraction of 35-40% and open-heart surgery in 2004.  He has been experiencing worsening shortness of breath over the past several months, described as 'rough'. He has been on oxygen therapy for about a month prescribed by PCP. He was hospitalized in September 2024 for ten days, during which he was placed on a small amount of oxygen but was discharged without it. This was a hospitalization for acute pulmonary edema and decompensated HF. His shortness of breath worsened after discharge, leading to the initiation of home oxygen therapy. He currently uses a large oxygen tank at home but finds it cumbersome, limiting his mobility. He experiences shortness of breath primarily when walking quickly but can manage daily activities at a slower pace without significant difficulty. He has not been using oxygen consistently, especially when moving around the house.   He takes medications including carvedilol and atorvastatin but is not consistently taking his diuretic, torsemide, as prescribed. He has experienced fluid retention, particularly in his legs and abdomen, but denies any leg  swelling or bloating at present.  He has a history of heart failure-related symptoms, including fluid buildup and deconditioning, contributing to his current respiratory issues. He has not been able to maintain his previous exercise routine at [employer] due to his shortness of breath. No cough, wheezing, or chest tightness. No history of frequent respiratory infections such as pneumonia or bronchitis. He has never smoked but was exposed to secondhand smoke from his parents during childhood.   Social history:  Occupation: retired, Occupational psychologist Exposures: lives at home with wife and adult daughter Smoking history: never smoker, passive smoke exposure in childhood  Social History   Occupational History   Occupation: Chaplain    Comment: [employer]    Comment: newly retired  Tobacco Use   Smoking status: Never   Smokeless tobacco: Never  Vaping Use   Vaping status: Never Used  Substance and Sexual Activity   Alcohol use: No   Drug use: No   Sexual activity: Not on file    Relevant family history:  Family History  Problem Relation Age of Onset   Heart disease Mother        before age 72   Hyperlipidemia Mother    Hypertension Mother    Heart attack Mother    Hyperlipidemia Father    Hypertension Father    Memory loss Brother    Memory loss Brother    Memory loss Half-Sister    Memory loss Maternal Aunt     Past Medical History:  Diagnosis Date   Acute osteomyelitis of ankle and foot 05/17/2014   Acute systolic  heart failure (HCC) 09/12/2023   Arthritis 10/27/2020   Wrist   Atherosclerosis of native arteries of the extremities with ulceration 06/15/2014   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  He is to restart his Xarelto on Sunday night.   CAD (coronary artery disease)    CHF (congestive heart failure)  01/14/2017   Intermittent CHF with lower extremity edema.  I have given him Lasix to take on a p.r.n. basis.Formatting of this note might be different from the original. Last Assessment & Plan: Formatting of this note might be different from the original. Intermittent CHF with lower extremity edema.  I have given him Las   Chronic kidney disease 10/27/2020   Stage II   Congestive cardiomyopathy 01/14/2017   Cardiomyopathy with LV dysfunction.Last ejection fraction 40-45% by echocardiography in 2012.Patient currently not taking beta-blocker Ace inhibitor for some reason.We will repeat echocardiography to evaluate LV function then determine the need for afterload reduction and beta-blocker therapy.[place]   Coronary atherosclerosis 01/14/2017   Status remote CABG in 2004 in [place].Left internal mammary artery to the obtuse marginal.Free Right internal mammary artery to the LAD.Saphenous vein graft to OM 2.Saphenous vein graft to the RCA.  Asymptomatic without chest pain or shortness of breath.Last nuclear stress test May 2018:  Large   DVT (deep venous thrombosis)    Dyslipidemia 05/07/2018   Hematuria 08/10/2015   Hypertension    ICD (implantable cardioverter-defibrillator) battery depletion 06/16/2018   Status post dual-chamber ICD generator change 06/16/2018.Last Assessment & Plan: Formatting of this note might be different from the original.Left infraclavicular incision intact with Dermabond.  There is no pocket hematoma present.  No drainage noted.Device interrogation this a.m. reveals appropriate device function.   Ischemic cardiomyopathy 06/18/2016   Echo 09/14/2011 showing LA 4.9cm, EF 40-45%.  Mild to moderate decreased LV systolic function.  Mild concentric LVH.  LA is moderately dilated.  Mild aortic cusp sclerosis. Aortic valve appears to be bicuspid.B. Concerning for potential dysrhythmia (ventricular tachycardia) especially given documented nonsustained ventricular tachy   Mild  dementia, unclear etiology 12/30/2022   Myocardial infarction    Obesity (BMI 30-39.9) 08/10/2015   Onychomycosis 05/10/2014   Painful legs and moving toes of left foot 05/10/2014   Paroxysmal atrial fibrillation 08/08/2015   A. Paroxysmal.B. On Sotalol therapy.Last Assessment & Plan: History of present atrial fibrillation.  Patient will 80 mg twice daily.Also on rivaroxaban 20 mg a day.Patient again refused an ECG.I explained to the patient, that the absence of an EKG, I canno   Peripheral vascular disease    Shortness of breath 10/27/2020   Exertional   Stroke 10/27/2020   Right frontal infarct per neuroimaging dating back at least to 2013   Syncope 01/14/2017   In the setting of ischemic cardiomyopathy. B. Concerning for potential dysrhythmia (ventricular tachycardia) especially given documented nonsustained ventricular tachycardia - EPS 9/25/201 showing reproducibly inducible nonsustained monomorphic VT up to 10 beat.  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 In the setting of ischemic cardiomyopathy. B. Concerning for potential dysrhythmia (ventricular tachycardia) especially given documented nonsustained ventricular tachycardia - EPS 9/25/201 showing reproducibly inducible nonsustained monomorphic VT up to 10 beat.  Reproducibly inducible nonsustained polymorphic VT.  Inducible VF wi    Past  Surgical History:  Procedure Laterality Date   CARDIAC DEFIBRILLATOR PLACEMENT     CORONARY ARTERY BYPASS GRAFT  10/2003   IR RADIOLOGIST EVAL & MGMT  10/26/2020   PR VEIN BYPASS GRAFT,AORTO-FEM-POP       Physical Exam: Blood pressure 136/70, pulse 69, height 6\' 2"  (1.88 m), weight 195 lb (88.5 kg), SpO2 100%. Gen:      No acute distress, fatigued, chronically ill, in wheelchair ENT:  no nasal polyps, mucus membranes moist Lungs:    Diminished No  increased respiratory effort, symmetric chest wall excursion, clear to auscultation bilaterally, no wheezes or crackles CV:         Regular rate and rhythm; no murmurs, rubs, or gallops.  No pedal edema Abd:      + bowel sounds; soft, non-tender; no distension MSK: no acute synovitis of DIP or PIP joints, no mechanics hands.  Skin:      lower extremities are tight and cracked, there are bilateral wounds with peeling off Neuro: normal speech, no focal facial asymmetry Psych: alert and oriented x3, normal mood and affect   Data Reviewed/Medical Decision Making:  Independent interpretation of tests: Imaging:  Review of patient's CT Chest 08/2024 images revealed bilateral pleural effusions R>L. The patient's images have been independently reviewed by me.    PFTs:  Labs:  Lab Results  Component Value Date   WBC 3.6 (L) 09/20/2023   HGB 10.8 (L) 09/20/2023   HCT 31.6 (L) 09/20/2023   MCV 79.2 (L) 09/20/2023   PLT 148 (L) 09/20/2023   Lab Results  Component Value Date   NA 142 02/10/2024   K 4.0 02/10/2024   CO2 25 02/10/2024   GLUCOSE 84 02/10/2024   BUN 28 (H) 02/10/2024   CREATININE 2.56 (H) 02/10/2024   CALCIUM 9.2 02/10/2024   EGFR 33.0 02/25/2024   GFRNONAA 33 (L) 11/07/2023     Immunization status:  Immunization History  Administered Date(s) Administered   Moderna Sars-Covid-2 Vaccination 04/05/2020, 05/03/2020, 11/24/2020, 06/07/2021     I reviewed prior external note(s) from hospital stay, cardiology  I reviewed the result(s) of the labs and imaging as noted above.   I have ordered pft   Assessment and Plan Assessment & Plan Dyspnea Dyspnea likely secondary to heart failure rather than primary pulmonary issue. Non-compliance with large oxygen tank, prefers POC for mobility. - Order pulmonary function testing to rule out primary lung issues. - Coordinate with NP [name] for POC order.  Congestive Heart Failure (CHF) CHF with reduced ejection fraction  contributing to dyspnea and deconditioning. Non-compliance with torsemide noted. Exercise and medication adherence critical. - Encourage regular exercise. - Ensure compliance with torsemide, carvedilol, and atorvastatin. - Monitor for fluid retention signs. - Follow up with cardiology.  Leg Edema and Skin Changes Edema and skin changes likely due to fluid retention from heart failure. Dry skin management advised. - Apply moisturizing agents to dry skin, avoid wound. - Continue Silvadene on wound.    Return to Care: Return if symptoms worsen or fail to improve.  Durel Salts, MD Pulmonary and Critical Care Medicine [facility] [phone]  CC: [name], FNP

## 2024-04-06 ENCOUNTER — Telehealth (HOSPITAL_COMMUNITY): Payer: Self-pay | Admitting: *Deleted

## 2024-04-06 NOTE — Telephone Encounter (Signed)
 Called to confirm/remind patient of their appointment at the [facility] on 03/26/24***.   Appointment:   [x] Confirmed  [] Left mess   [] No answer/No voice mail  [] Phone not in service  Patient reminded to bring all medications and/or complete list.  Confirmed patient has transportation. Gave directions, instructed to utilize valet parking.

## 2024-04-07 ENCOUNTER — Encounter (HOSPITAL_COMMUNITY)

## 2024-04-09 ENCOUNTER — Ambulatory Visit (HOSPITAL_COMMUNITY): Attending: Cardiovascular Disease

## 2024-04-10 DIAGNOSIS — I110 Hypertensive heart disease with heart failure: Secondary | ICD-10-CM | POA: Diagnosis not present

## 2024-04-10 DIAGNOSIS — R0602 Shortness of breath: Secondary | ICD-10-CM | POA: Diagnosis not present

## 2024-04-10 DIAGNOSIS — I5022 Chronic systolic (congestive) heart failure: Secondary | ICD-10-CM | POA: Diagnosis not present

## 2024-04-14 ENCOUNTER — Ambulatory Visit: Payer: Medicare Other | Admitting: Physician Assistant

## 2024-04-14 ENCOUNTER — Encounter: Payer: Self-pay | Admitting: Physician Assistant

## 2024-04-14 DIAGNOSIS — Z029 Encounter for administrative examinations, unspecified: Secondary | ICD-10-CM

## 2024-04-23 ENCOUNTER — Encounter (HOSPITAL_COMMUNITY)

## 2024-05-06 ENCOUNTER — Ambulatory Visit: Payer: Self-pay

## 2024-05-06 ENCOUNTER — Telehealth: Payer: Self-pay

## 2024-05-06 NOTE — Telephone Encounter (Signed)
 Copied from CRM [id number]. Topic: Clinical - Red Word Triage >> May 06, 2024 11:28 AM El Gravely T wrote: [facility] that prompted transfer to Nurse Triage: shortness of breathe, difficulty breathing, concerns with oxygen levels   Chief Complaint: Dyspnea  Symptoms: Dyspnea with exertion  Frequency: Chronic  Pertinent Negatives: Patient denies chest pain, cyanosis,   Disposition: [] ED /[] Urgent Care (no appt availability in office) / [x] Appointment(In office/virtual)/ []  [facility]/ [] Home Care/ [x] Refused Recommended Disposition /[]  Mobile Bus/ []  Follow-up with PCP Additional Notes: Spoke with Wife for triage, the patient is being triaged for dyspnea and concerns of his oxygen levels. The patient wears oxygen as needed, wears 2L when in use. The patient also has developed yellowing of his eyes over the past two weeks. Recommended the patient be seen earlier due to symptoms, Wife stated she would keep the appointment for the 27th and call back if anything else changed. Reiterated the need for earlier appointments and offered earlier appointment times, verbalized understanding, refused recommended disposition.   Reason for Disposition  [1] MODERATE longstanding difficulty breathing (e.g., speaks in phrases, SOB even at rest, pulse 100-120) AND [2] SAME as normal  Answer Assessment - Initial Assessment Questions 1. RESPIRATORY STATUS: "Describe your breathing?" (e.g., wheezing, shortness of breath, unable to speak, severe coughing)       Dyspnea with exertion  2. ONSET: "When did this breathing problem begin?"      Chronic  3. PATTERN "Does the difficult breathing come and go, or has it been constant since it started?"      Intermittent  4. SEVERITY: "How bad is your breathing?" (e.g., mild, moderate, severe)    - MILD: No SOB at rest, mild SOB with walking, speaks normally in sentences, can lie down, no retractions, pulse < 100.    - MODERATE: SOB at rest, SOB with  minimal exertion and prefers to sit, cannot lie down flat, speaks in phrases, mild retractions, audible wheezing, pulse 100-120.    - SEVERE: Very SOB at rest, speaks in single words, struggling to breathe, sitting hunched forward, retractions, pulse > 120      Mild to Moderate  5. RECURRENT SYMPTOM: "Have you had difficulty breathing before?" If Yes, ask: "When was the last time?" and "What happened that time?"       Yes  6. CARDIAC HISTORY: "Do you have any history of heart disease?" (e.g., heart attack, angina, bypass surgery, angioplasty)      CHF  7. LUNG HISTORY: "Do you have any history of lung disease?"  (e.g., pulmonary embolus, asthma, emphysema)     No  8. CAUSE: "What do you think is causing the breathing problem?"      Unsure  9. OTHER SYMPTOMS: "Do you have any other symptoms? (e.g., dizziness, runny nose, cough, chest pain, fever)     No  10. O2 SATURATION MONITOR:  "Do you use an oxygen saturation monitor (pulse oximeter) at home?" If Yes, ask: "What is your reading (oxygen level) today?" "What is your usual oxygen saturation reading?" (e.g., 95%)       Unsure  12. TRAVEL: "Have you traveled out of the country in the last month?" (e.g., travel history, exposures)       No  Protocols used: Breathing Difficulty-A-AH

## 2024-05-06 NOTE — Telephone Encounter (Signed)
 Patients wife was called and advised to reach out to his pulmonary doctor.

## 2024-05-06 NOTE — Telephone Encounter (Addendum)
 Called patient to see how long he had medicare- if less than a year he needs Welcome to medicare with Provider- If over a year patient needs AWV ( CAN BE ON FRIDAY CMA [facility])- LVM

## 2024-05-07 ENCOUNTER — Ambulatory Visit: Payer: Self-pay

## 2024-05-18 ENCOUNTER — Ambulatory Visit: Payer: Self-pay | Admitting: Nurse Practitioner

## 2024-05-18 DIAGNOSIS — N1832 Chronic kidney disease, stage 3b: Secondary | ICD-10-CM | POA: Diagnosis not present

## 2024-05-18 DIAGNOSIS — D631 Anemia in chronic kidney disease: Secondary | ICD-10-CM | POA: Diagnosis not present

## 2024-05-18 DIAGNOSIS — I129 Hypertensive chronic kidney disease with stage 1 through stage 4 chronic kidney disease, or unspecified chronic kidney disease: Secondary | ICD-10-CM | POA: Diagnosis not present

## 2024-05-18 DIAGNOSIS — N189 Chronic kidney disease, unspecified: Secondary | ICD-10-CM | POA: Diagnosis not present

## 2024-05-18 DIAGNOSIS — N2581 Secondary hyperparathyroidism of renal origin: Secondary | ICD-10-CM | POA: Diagnosis not present

## 2024-05-19 ENCOUNTER — Telehealth: Payer: Self-pay

## 2024-05-19 ENCOUNTER — Encounter: Payer: Self-pay | Admitting: Nurse Practitioner

## 2024-05-19 ENCOUNTER — Ambulatory Visit (INDEPENDENT_AMBULATORY_CARE_PROVIDER_SITE_OTHER): Payer: Self-pay | Admitting: Nurse Practitioner

## 2024-05-19 VITALS — BP 130/70 | HR 74 | Temp 98.8°F | Ht 74.0 in | Wt 281.6 lb

## 2024-05-19 DIAGNOSIS — R0602 Shortness of breath: Secondary | ICD-10-CM | POA: Diagnosis not present

## 2024-05-19 DIAGNOSIS — E6609 Other obesity due to excess calories: Secondary | ICD-10-CM | POA: Diagnosis not present

## 2024-05-19 DIAGNOSIS — I7025 Atherosclerosis of native arteries of other extremities with ulceration: Secondary | ICD-10-CM

## 2024-05-19 DIAGNOSIS — Z6836 Body mass index (BMI) 36.0-36.9, adult: Secondary | ICD-10-CM | POA: Diagnosis not present

## 2024-05-19 DIAGNOSIS — I4821 Permanent atrial fibrillation: Secondary | ICD-10-CM

## 2024-05-19 DIAGNOSIS — E66812 Obesity, class 2: Secondary | ICD-10-CM | POA: Diagnosis not present

## 2024-05-19 DIAGNOSIS — I7090 Unspecified atherosclerosis: Secondary | ICD-10-CM

## 2024-05-19 DIAGNOSIS — I110 Hypertensive heart disease with heart failure: Secondary | ICD-10-CM

## 2024-05-19 DIAGNOSIS — R2681 Unsteadiness on feet: Secondary | ICD-10-CM | POA: Diagnosis not present

## 2024-05-19 DIAGNOSIS — R7309 Other abnormal glucose: Secondary | ICD-10-CM

## 2024-05-19 DIAGNOSIS — Z2821 Immunization not carried out because of patient refusal: Secondary | ICD-10-CM

## 2024-05-19 DIAGNOSIS — N1832 Chronic kidney disease, stage 3b: Secondary | ICD-10-CM

## 2024-05-19 DIAGNOSIS — Z95810 Presence of automatic (implantable) cardiac defibrillator: Secondary | ICD-10-CM | POA: Diagnosis not present

## 2024-05-19 DIAGNOSIS — I509 Heart failure, unspecified: Secondary | ICD-10-CM

## 2024-05-19 DIAGNOSIS — N184 Chronic kidney disease, stage 4 (severe): Secondary | ICD-10-CM | POA: Diagnosis not present

## 2024-05-19 DIAGNOSIS — I5022 Chronic systolic (congestive) heart failure: Secondary | ICD-10-CM

## 2024-05-19 LAB — LAB REPORT - SCANNED: EGFR: 38

## 2024-05-19 NOTE — Telephone Encounter (Signed)
 Patient's order for a rollator has been submitted through [employer]. YL,RMA

## 2024-05-19 NOTE — Progress Notes (Signed)
 [place], CMA,acting as a Neurosurgeon for Connor Epley, FNP.,have documented all relevant documentation on the behalf of Connor Epley, FNP,as directed by  Connor Epley, FNP while in the presence of Connor Epley, FNP.  Subjective:  Patient ID: Connor Burns. , male    DOB: 14-Oct-1956 , 68 y.o.   MRN: [id number]  Chief Complaint  Patient presents with   Hypertension    Patient presents today for a bp follow up, Patient reports compliance with medication. Patient denies any chest pain, SOB, or headaches. Patient has no concerns today.    Shortness of Breath    Patient reports he is short of breath but currently doesn't have his oxygen with him, he states it makes him look like a old man and he doesn't like to carry it around. He reports he doesn't wear his oxygen in the house either. He only wears it sometimes even though he is SOB.    HPI  He continues to be home alone most times when his wife is at work.  He seen the nephrologist yesterday, they took labs.  The patient is a male with a history of heart problems presenting for follow-up. He reports ongoing shortness of breath, which is exacerbated by inconsistent use of prescribed oxygen. The patient was recently hospitalized and lost significant fluid weight. He has been prescribed multiple medications for his heart condition but reports difficulty with consistent adherence. Will get him set up again with pharmacy in which they have attempted to contact several times. The patient's mobility is limited, impacting his ability to exercise as recommended.    Past Medical History:  Diagnosis Date   Acute osteomyelitis of ankle and foot 05/17/2014   Acute systolic heart failure (HCC) 09/12/2023   Arthritis 10/27/2020   Wrist   Atherosclerosis of native arteries of the extremities with ulceration 06/15/2014   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   veral times a day for 10 minutes each time; The patient was also  instructed to hold his Xarelto  tonight and tomorrow night.  He is to restart his Xarelto  on Sunday night.   CAD (coronary artery disease)    CHF (congestive heart failure) 01/14/2017   Intermittent CHF with lower extremity edema.  I have given him Lasix  to take on a p.r.n. basis.Formatting of this note might be different from the original. Last Assessment & Plan: Formatting of this note might be different from the original. Intermittent CHF with lower extremity edema.  I have given him Las   Chronic kidney disease 10/27/2020   Stage II   Congestive cardiomyopathy 01/14/2017   Cardiomyopathy with LV dysfunction.Last ejection fraction 40-45% by echocardiography in 2012.Patient currently not taking beta-blocker Ace inhibitor for some reason.We will repeat echocardiography to evaluate LV function then determine the need for afterload reduction and beta-blocker therapy.[place]   Coronary atherosclerosis 01/14/2017   Status remote CABG in 2004 in [place] .Left internal mammary artery to the obtuse marginal.Free Right internal mammary artery to the LAD.Saphenous vein graft to OM 2.Saphenous vein graft to the RCA.  Asymptomatic without chest pain or shortness of breath.Last nuclear stress test May 2018:  Large   DVT (deep venous thrombosis)    Dyslipidemia 05/07/2018   Hematuria 08/10/2015   Hypertension    ICD (implantable cardioverter-defibrillator) battery depletion 06/16/2018   Status post dual-chamber ICD generator change 06/16/2018.Last Assessment & Plan: Formatting of this note might be different from the original.Left infraclavicular incision intact with Dermabond.  There is no pocket hematoma present.  No drainage noted.Device interrogation this a.m. reveals appropriate device function.   Ischemic cardiomyopathy 06/18/2016   Echo 09/14/2011 showing LA 4.9cm, EF 40-45%.  Mild to moderate decreased LV systolic function.  Mild concentric LVH.  LA is moderately dilated.  Mild aortic cusp sclerosis.  Aortic valve appears to be bicuspid.B. Concerning for potential dysrhythmia (ventricular tachycardia) especially given documented nonsustained ventricular tachy   Mild dementia, unclear etiology 12/30/2022   Myocardial infarction    Obesity (BMI 30-39.9) 08/10/2015   Onychomycosis 05/10/2014   Painful legs and moving toes of left foot 05/10/2014   Paroxysmal atrial fibrillation 08/08/2015   A. Paroxysmal.B. On Sotalol therapy.Last Assessment & Plan: History of present atrial fibrillation.  Patient will 80 mg twice daily.Also on rivaroxaban  20 mg a day.Patient again refused an ECG.I explained to the patient, that the absence of an EKG, I canno   Peripheral vascular disease    Shortness of breath 10/27/2020   Exertional   Stroke 10/27/2020   Right frontal infarct per neuroimaging dating back at least to 2013   Syncope 01/14/2017   In the setting of ischemic cardiomyopathy. B. Concerning for potential dysrhythmia (ventricular tachycardia) especially given documented nonsustained ventricular tachycardia - EPS 9/25/201 showing reproducibly inducible nonsustained monomorphic VT up to 10 beat.  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 In the setting of ischemic cardiomyopathy. B. Concerning for potential dysrhythmia (ventricular tachycardia) especially given documented nonsustained ventricular tachycardia - EPS 9/25/201 showing reproducibly inducible nonsustained monomorphic VT up to 10 beat.  Reproducibly inducible nonsustained polymorphic VT.  Inducible VF wi     Family History  Problem Relation Age of Onset   Heart disease Mother        before age 46   Hyperlipidemia Mother    Hypertension Mother    Heart attack Mother    Hyperlipidemia Father    Hypertension Father    Memory loss Brother    Memory loss Brother    Memory loss Half-Sister    Memory  loss Maternal Aunt      Current Outpatient Medications:    apixaban  (ELIQUIS ) 5 MG TABS tablet, Take 1 tablet (5 mg total) by mouth 2 (two) times daily., Disp: 60 tablet, Rfl: 3   apixaban  (ELIQUIS ) 5 MG TABS tablet, Take 1 tablet (5 mg total) by mouth 2 (two) times daily., Disp: , Rfl:    atorvastatin  (LIPITOR) 20 MG tablet, Take 1 tablet (20 mg total) by mouth daily., Disp: 30 tablet, Rfl: 2   carvedilol  (COREG ) 3.125 MG tablet, Take 1 tablet (3.125 mg total) by mouth 2 (two) times daily., Disp: 60 tablet, Rfl: 2   cyanocobalamin  (VITAMIN B12) 1000 MCG tablet, Take 1 tablet (1,000 mcg total) by mouth daily., Disp: 30 tablet, Rfl: 2   donepezil  (ARICEPT ) 10 MG tablet, Take 1 tablet (10 mg total) by mouth daily., Disp: 30 tablet, Rfl: 11   empagliflozin  (JARDIANCE ) 10 MG TABS tablet, Take 1 tablet (10 mg total) by mouth daily., Disp: 30 tablet, Rfl: 2   empagliflozin  (JARDIANCE ) 10 MG TABS tablet, Take 1 tablet (10 mg total) by mouth daily., Disp: , Rfl:    isosorbide  mononitrate (IMDUR ) 30 MG 24 hr tablet, Take 30 mg by mouth daily. Patient is taking 1/2 tablet daily per instructions on his prescription bottle, Disp: , Rfl:    potassium chloride  SA (KLOR-CON  M) 20 MEQ tablet,  Take 60 mEq by mouth 2 (two) times daily., Disp: , Rfl:    silver  sulfADIAZINE  (SILVADENE ) 1 % cream, Apply 1 Application topically 2 (two) times daily. Clean site of wound and apply cream., Disp: 400 g, Rfl: 1   SV IRON 325 (65 Fe) MG tablet, Take 325 mg by mouth 3 (three) times a week., Disp: , Rfl:    torsemide  (DEMADEX ) 20 MG tablet, Take 2 tablets (40 mg total) by mouth 2 (two) times daily., Disp: 180 tablet, Rfl: 2   Allergies  Allergen Reactions   Clopidogrel Anaphylaxis and Other (See Comments)    "Wierd, like out of body thing" Out of body experience    Plavix [Clopidogrel Bisulfate]      Review of Systems   Today's Vitals   05/19/24 1550  BP: 130/70  Pulse: 74  Temp: 98.8 F (37.1 C)  TempSrc: Oral   SpO2: 95%  Weight: 281 lb 9.6 oz (127.7 kg)  Height: 6\' 2"  (1.88 m)  PainSc: 0-No pain   Body mass index is 36.16 kg/m.  Wt Readings from Last 3 Encounters:  05/19/24 281 lb 9.6 oz (127.7 kg)  03/30/24 195 lb (88.5 kg)  02/18/24 291 lb (132 kg)    The ASCVD Risk score (Arnett DK, et al., 2019) failed to calculate for the following reasons:   Risk score cannot be calculated because patient has a medical history suggesting prior/existing ASCVD  Objective:  Physical Exam Vitals reviewed.  Constitutional:      General: He is not in acute distress.    Appearance: Normal appearance. He is obese.  Pulmonary:     Effort: Pulmonary effort is normal. No respiratory distress.     Breath sounds: Decreased breath sounds present.     Comments: He is speaking in complete sentences Chest:     Chest wall: No mass.  Musculoskeletal:        General: No swelling.     Right lower leg: Edema present.     Left lower leg: Edema present.  Skin:    General: Skin is warm and dry.     Capillary Refill: Capillary refill takes less than 2 seconds.  Neurological:     General: No focal deficit present.     Mental Status: He is alert and oriented to person, place, and time.     Cranial Nerves: No cranial nerve deficit.     Motor: No weakness.  Psychiatric:        Mood and Affect: Mood normal. Mood is not anxious.        Behavior: Behavior normal.        Thought Content: Thought content normal.        Judgment: Judgment normal.       Assessment And Plan:  Hypertensive heart disease with chronic systolic congestive heart failure (HCC) Assessment & Plan: Blood pressure is well controlled, continue current medications. Continue f/u with Cardiology    Chronic systolic congestive heart failure [facility]) Assessment & Plan:     - Patient has a history of significant fluid retention requiring hospitalization    - Currently experiencing shortness of breath, likely due to cardiac issues as he has seen  pulmonology and they do not feel this is related to his lungs.     - Inconsistent use of prescribed oxygen and medications    Plan:    - Emphasize importance of consistent oxygen use and medication adherence    - Adjust diuretic regimen as needed based on symptoms  and lab results    - he is to f/u with cardiology for optimization of heart failure management   Atrial fibrillation, permanent [facility]) Assessment & Plan: Continue f/u with Cariology and taking medications as directed   CKD (chronic kidney disease) stage 4, GFR 15-29 ml/min (HCC)  Automatic implantable cardioverter-defibrillator in situ Assessment & Plan: Continue f/u with cardiology and taking Xarelto    Shortness of breath Assessment & Plan: He is not wearing his oxygen today. I have placed an order for a smaller portable oxygen tank hopefully this will help him to get to his appts easier   COVID-19 vaccination declined Assessment & Plan: Declines covid 19 vaccine. Discussed risk of covid 72 and if he changes her mind about the vaccine to call the office. Education has been provided regarding the importance of this vaccine but patient still declined. Advised may receive this vaccine at local pharmacy or Health Dept.or vaccine clinic. Aware to provide a copy of the vaccination record if obtained from local pharmacy or Health Dept.  Encouraged to take multivitamin, vitamin d, vitamin c and zinc to increase immune system. Aware can call office if would like to have vaccine here at office. Verbalized acceptance and understanding.     Herpes zoster vaccination declined Assessment & Plan: Declines shingrix, educated on disease process and is aware if he changes his mind to notify office    Class 2 obesity due to excess calories with body mass index (BMI) of 36.0 to 36.9 in adult, unspecified whether serious comorbidity present Assessment & Plan: he is encouraged to strive for BMI less than 30 to decrease cardiac risk.  Advised to aim for at least 150 minutes of exercise per week.    Chronic kidney disease, stage 3b [facility]) Assessment & Plan: Continue f/u with Nephrology. Encouraged hydration   Abnormal glucose -     Hemoglobin A1c  Gait instability  Atherosclerosis of artery -     Lipid panel    No follow-ups on file.  Patient was given opportunity to ask questions. Patient verbalized understanding of the plan and was able to repeat key elements of the plan. All questions were answered to their satisfaction.   Inge Mangle, FNP, have reviewed all documentation for this visit. The documentation on 05/19/24 for the exam, diagnosis, procedures, and orders are all accurate and complete.    IF YOU HAVE BEEN REFERRED TO A SPECIALIST, IT MAY TAKE 1-2 WEEKS TO SCHEDULE/PROCESS THE REFERRAL. IF YOU HAVE NOT HEARD FROM US /SPECIALIST IN TWO WEEKS, PLEASE GIVE US  A CALL AT [phone].

## 2024-05-20 ENCOUNTER — Ambulatory Visit: Payer: Self-pay | Admitting: Nurse Practitioner

## 2024-05-20 LAB — HEMOGLOBIN A1C
Est. average glucose Bld gHb Est-mCnc: 103 mg/dL
Hgb A1c MFr Bld: 5.2 % (ref 4.8–5.6)

## 2024-05-20 LAB — LIPID PANEL
Chol/HDL Ratio: 2.4 ratio (ref 0.0–5.0)
Cholesterol, Total: 137 mg/dL (ref 100–199)
HDL: 56 mg/dL (ref >39)
LDL Chol Calc (NIH): 69 mg/dL (ref 0–99)
Triglycerides: 56 mg/dL (ref 0–149)
VLDL Cholesterol Cal: 12 mg/dL (ref 5–40)

## 2024-05-21 ENCOUNTER — Telehealth (HOSPITAL_COMMUNITY): Payer: Self-pay

## 2024-05-21 NOTE — Progress Notes (Signed)
 Advanced [facility] Failure Clinic Note   Primary Care: [name], FNP Primary Cardiologist: Dr. Emmette Harms Nephrology: Dr. Lydia Sams ([facility])  HPI: 68 y.o. male with history of CAD s/p MI/CABG in 2004 in [place], HFrEF/ICM s/p ICD, longstanding persistent atrial fibrillation, hx VT, CKD, hx DVT, obesity, dementia, PAD. He previously slaw Dr. Avanell Bob in 2018, then followed with Cardiology in [place], [place]. Most recently established with Dr. Emmette Harms in 03/24 after moving back to the area.   Had cath in 2015. Report not available. Available records suggest 2 of his vein grafts were occluded.  Echo 2019: EF 30-35%  Echo at OSH 03/24: EF 30-35%, RV function not assessed  He had a stress test in 3/24 with large area of infarction in inferior wall without reversible ischemia, LVEF 23%. Study was reviewed by Dr. Emmette Harms. Decision made to defer LHC d/t kidney function.   He was admitted 9/24 with acute respiratory failure with hypoxia 2/2 acute on chronic CHF. Had not been compliant with medications. Echo during admit with EF 35-40%, RWMA, RV mildly reduced. Cardiology consulted. He was diuresed. GDMT limited by CKD, blood pressure too soft for BiDil. Weight on admit 294 lb >>> 225 lb at discharge.   He was seen in TOC x 2 post hospital and was instructed to continue ongoing care w/ general cardiology w/ f/u in [facility] as needed.   Follow up 2/25 with [facility] and was significantly volume overloaded, wt was up ~50 lb, in the setting of dietary indiscretion and nonadherance to home diuretic regimen. Furoscix  was considered but pt declined citing fear of needles. Torsemide  was increased and he was instructed to follow back up in the [facility].   Today he presents for HF follow up with his wife. Overall feeling fine. He is SOB walking further distances on flat ground. He has an ulcer on RLE, using Silvadene  cream. Denies palpitations, abnormal bleeding, CP, dizziness, edema, or PND/Orthopnea. Appetite fair, has early satiety.  He does not weigh at home. Taking all medications. 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   Past Medical History:  Diagnosis Date   Acute osteomyelitis of ankle and foot 05/17/2014   Acute systolic heart failure (HCC) 09/12/2023   Arthritis 10/27/2020   Wrist   Atherosclerosis of native arteries of the extremities with ulceration 06/15/2014   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   veral times a day for 10 minutes each time; The patient was also instructed to hold his Xarelto  tonight and tomorrow night.  He is to restart his Xarelto  on Sunday night.   CAD (coronary artery disease)    CHF (congestive heart failure) 01/14/2017   Intermittent CHF with lower extremity edema.  I have given him Lasix  to take on a p.r.n. basis.Formatting of this note might be different from the original. Last Assessment & Plan: Formatting of this note might be different from the original. Intermittent CHF with lower extremity edema.  I have given him Las   Chronic kidney disease 10/27/2020   Stage II   Congestive cardiomyopathy 01/14/2017   Cardiomyopathy with LV dysfunction.Last ejection fraction 40-45% by echocardiography in 2012.Patient currently not taking beta-blocker Ace inhibitor for some reason.We will repeat echocardiography to evaluate LV function then determine the need for afterload reduction and beta-blocker therapy.[place]   Coronary atherosclerosis 01/14/2017   Status remote CABG in 2004 in [place] .Left internal mammary artery to the obtuse marginal.Free Right internal mammary artery to the LAD.Saphenous vein graft to OM 2.Saphenous vein graft to the  RCA.  Asymptomatic without chest pain or shortness of breath.Last nuclear stress test May 2018:  Large   DVT (deep venous thrombosis)    Dyslipidemia 05/07/2018   Hematuria 08/10/2015   Hypertension    ICD (implantable cardioverter-defibrillator) battery depletion 06/16/2018   Status post dual-chamber ICD  generator change 06/16/2018.Last Assessment & Plan: Formatting of this note might be different from the original.Left infraclavicular incision intact with Dermabond.  There is no pocket hematoma present.  No drainage noted.Device interrogation this a.m. reveals appropriate device function.   Ischemic cardiomyopathy 06/18/2016   Echo 09/14/2011 showing LA 4.9cm, EF 40-45%.  Mild to moderate decreased LV systolic function.  Mild concentric LVH.  LA is moderately dilated.  Mild aortic cusp sclerosis. Aortic valve appears to be bicuspid.B. Concerning for potential dysrhythmia (ventricular tachycardia) especially given documented nonsustained ventricular tachy   Mild dementia, unclear etiology 12/30/2022   Myocardial infarction    Obesity (BMI 30-39.9) 08/10/2015   Onychomycosis 05/10/2014   Painful legs and moving toes of left foot 05/10/2014   Paroxysmal atrial fibrillation 08/08/2015   A. Paroxysmal.B. On Sotalol therapy.Last Assessment & Plan: History of present atrial fibrillation.  Patient will 80 mg twice daily.Also on rivaroxaban  20 mg a day.Patient again refused an ECG.I explained to the patient, that the absence of an EKG, I canno   Peripheral vascular disease    Shortness of breath 10/27/2020   Exertional   Stroke 10/27/2020   Right frontal infarct per neuroimaging dating back at least to 2013   Syncope 01/14/2017   In the setting of ischemic cardiomyopathy. B. Concerning for potential dysrhythmia (ventricular tachycardia) especially given documented nonsustained ventricular tachycardia - EPS 9/25/201 showing reproducibly inducible nonsustained monomorphic VT up to 10 beat.  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 In the setting of ischemic cardiomyopathy. B. Concerning for potential dysrhythmia (ventricular tachycardia) especially given documented  nonsustained ventricular tachycardia - EPS 9/25/201 showing reproducibly inducible nonsustained monomorphic VT up to 10 beat.  Reproducibly inducible nonsustained polymorphic VT.  Inducible VF wi    Current Outpatient Medications  Medication Sig Dispense Refill   apixaban  (ELIQUIS ) 5 MG TABS tablet Take 1 tablet (5 mg total) by mouth 2 (two) times daily.     atorvastatin  (LIPITOR) 20 MG tablet Take 1 tablet (20 mg total) by mouth daily. 30 tablet 2   carvedilol  (COREG ) 3.125 MG tablet Take 1 tablet (3.125 mg total) by mouth 2 (two) times daily. 60 tablet 2   cyanocobalamin  (VITAMIN B12) 1000 MCG tablet Take 1 tablet (1,000 mcg total) by mouth daily. 30 tablet 2   empagliflozin  (JARDIANCE ) 10 MG TABS tablet Take 1 tablet (10 mg total) by mouth daily. 30 tablet 2   empagliflozin  (JARDIANCE ) 10 MG TABS tablet Take 1 tablet (10 mg total) by mouth daily.     isosorbide  mononitrate (IMDUR ) 30 MG 24 hr tablet Take 30 mg by mouth daily. Patient is taking 1/2 tablet daily per instructions on his prescription bottle     potassium chloride  SA (KLOR-CON  M) 20 MEQ tablet Take 60 mEq by mouth 2 (two) times daily.     silver  sulfADIAZINE  (SILVADENE ) 1 % cream Apply 1 Application topically 2 (two) times daily. Clean site of wound and apply cream. 400 g 1   SV IRON 325 (65 Fe) MG tablet Take 325 mg by mouth 3 (three) times a week.  torsemide  (DEMADEX ) 20 MG tablet Take 2 tablets (40 mg total) by mouth 2 (two) times daily. 180 tablet 2   apixaban  (ELIQUIS ) 5 MG TABS tablet Take 1 tablet (5 mg total) by mouth 2 (two) times daily. 60 tablet 3   donepezil  (ARICEPT ) 10 MG tablet Take 1 tablet (10 mg total) by mouth daily. (Patient not taking: Reported on 05/24/2024) 30 tablet 11   No current facility-administered medications for this encounter.   Allergies  Allergen Reactions   Clopidogrel Anaphylaxis and Other (See Comments)    "Wierd, like out of body thing" Out of body experience    Plavix [Clopidogrel  Bisulfate]    Social History   Socioeconomic History   Marital status: Married    Spouse name: [name]   Number of children: 1   Years of education: 77   Highest education level: Master's degree (e.g., MA, MS, MEng, MEd, MSW, MBA)  Occupational History   Occupation: Chaplain    Comment: [employer]    Comment: newly retired  Tobacco Use   Smoking status: Never   Smokeless tobacco: Never  Vaping Use   Vaping status: Never Used  Substance and Sexual Activity   Alcohol use: No   Drug use: No   Sexual activity: Not on file  Other Topics Concern   Not on file  Social History Narrative   Lives with wife in a 2 story home.  Has 3 children.     Works as a Metallurgist.     Education: Masters in [employer].   Right handed   Caffeine yes   Social Drivers of Health   Financial Resource Strain: Medium Risk (02/19/2024)   Overall Financial Resource Strain (CARDIA)    Difficulty of Paying Living Expenses: Somewhat hard  Food Insecurity: Food Insecurity Present (02/19/2024)   Hunger Vital Sign    Worried About Running Out of Food in the Last Year: Sometimes true    Ran Out of Food in the Last Year: Never true  Transportation Needs: No Transportation Needs (02/19/2024)   PRAPARE - Administrator, Civil Service (Medical): No    Lack of Transportation (Non-Medical): No  Physical Activity: Not on file  Stress: Not on file  Social Connections: Not on file  Intimate Partner Violence: Patient Unable To Answer (02/25/2024)   Humiliation, Afraid, Rape, and Kick questionnaire    Fear of Current or Ex-Partner: Patient unable to answer    Emotionally Abused: Patient unable to answer    Physically Abused: Patient unable to answer    Sexually Abused: Patient unable to answer    Family History  Problem Relation Age of Onset   Heart disease Mother        before age 80   Hyperlipidemia Mother    Hypertension Mother    Heart attack Mother    Hyperlipidemia Father    Hypertension  Father    Memory loss Brother    Memory loss Brother    Memory loss Half-Sister    Memory loss Maternal Aunt    Wt Readings from Last 3 Encounters:  05/24/24 124 kg (273 lb 6.4 oz)  05/19/24 127.7 kg (281 lb 9.6 oz)  03/30/24 88.5 kg (195 lb)   BP 110/70 (BP Location: Left Arm, Patient Position: Sitting)   Pulse 74   Wt 124 kg (273 lb 6.4 oz)   SpO2 96%   BMI 35.10 kg/m   PHYSICAL EXAM: General:  NAD. No resp difficulty, arrived in [facility], chronically-ill  appearing HEENT: Normal Neck: Supple. No JVD. Cor: Irregular rate & rhythm. No rubs, gallops or murmurs. Lungs: Clear Abdomen: Soft, nontender, nondistended.  Extremities: No cyanosis, clubbing, rash, trace RLE edema, with wound with serous drainage. Neuro: Alert & oriented x 3, moves all 4 extremities w/o difficulty. Affect pleasant.  ECG (personally reviewed): AF 77 bpm  Device interrogation (personally reviewed): OptiVol ok, 100% AF, no VT  ASSESSMENT & PLAN: Chronic Systolic Heart Failure - iCM - Hx MI s/p CABG in 2004 - s/p ICD - EF has been in the 30-35% x years - Echo 9/24 : EF 35-40%, RV mildly reduced - NYHA IIb-early III, suspect he down-plays symptoms. He does not appear markedly volume overloaded, GDMT limited by CKD IV. - Continue torsemide  40 mg bid + 60 KCL bid.  - Continue Coreg  3.125 mg bid. - Continue Jardiance  10 mg daily. No GU symptoms. - Continue Imdur  15 mg daily, no BP room to add hydral today - Gifted a scale today. - Labs today   2. CAD - Hx MI and CABG (LIMA to OM, Free RIMA to LAD, SVG to OM2, SVG to RCA) in 2003. - Records report 2 vein grafts occluded on cath in 2015. - No chest pain. - No ASA with anticoagulation. - Continue statin. - Continue Imdur .  3. Persistent atrial fibrillation - hx Afib ablation - Pursuing rate control - Continue Coreg . - Continue Eliquis  5 mg bid. - Followed by EP.  4. CKD IV - Baseline SCr 2.3-2.5. - Continue Jardiance . - Labs today  5.  Dementia - Follows with Neurology.   - Wife is assisting with medications  6. VT - Hx ICD placement - Established with EP  7. PAD - Previously followed by Dr Shaunna Delaware with [facility], has hx of osteomyelitis - Denies claudication but generally not active - RLE wound, + draining serous fluid, Difficult to palpate pedal pulses - Start doxy 100  mg bid x 7 days - Arrange ABIs - Refer to Wound Clinic & PAD clinic. Likely needs angiogram soon - Continue statin - On AC  Follow up in 3 months with APP.  Arlice Bene [place], FNP-BC 05/24/24

## 2024-05-21 NOTE — Telephone Encounter (Signed)
 Called and spoke to pt's Wife Adriana Hopping to confirm/remind patient of their appointment at the [facility] on 05/24/24.   Appointment:   [x] Confirmed  [] Left mess   [] No answer/No voice mail  [] VM Full/unable to leave message  [] Phone not in service  Patient reminded to bring all medications and/or complete list.  Confirmed patient has transportation. Gave directions, instructed to utilize valet parking.

## 2024-05-22 NOTE — Assessment & Plan Note (Signed)

## 2024-05-22 NOTE — Assessment & Plan Note (Signed)
 he is encouraged to strive for BMI less than 30 to decrease cardiac risk. Advised to aim for at least 150 minutes of exercise per week.

## 2024-05-22 NOTE — Assessment & Plan Note (Signed)
 Continue f/u with Nephrology. Encouraged hydration

## 2024-05-22 NOTE — Assessment & Plan Note (Signed)
>>  ASSESSMENT AND PLAN FOR ACUTE EXACERBATION OF CHF (CONGESTIVE HEART FAILURE) (HCC) WRITTEN ON 05/22/2024 11:42 PM BY GEORGINA SPEAKS, FNP      - Patient has a history of significant fluid retention requiring hospitalization    - Currently experiencing shortness of breath, likely due to cardiac issues as he has seen pulmonology and they do not feel this is related to his lungs.     - Inconsistent use of prescribed oxygen  and medications    Plan:    - Emphasize importance of consistent oxygen  use and medication adherence    - Adjust diuretic regimen as needed based on symptoms and lab results    - he is to f/u with cardiology for optimization of heart failure management

## 2024-05-22 NOTE — Assessment & Plan Note (Signed)
 Blood pressure is well controlled, continue current medications.  Continue f/u with Cardiology.

## 2024-05-22 NOTE — Assessment & Plan Note (Signed)
 Continue f/u with Cariology and taking medications as directed

## 2024-05-22 NOTE — Assessment & Plan Note (Addendum)
   -   Patient has a history of significant fluid retention requiring hospitalization    - Currently experiencing shortness of breath, likely due to cardiac issues as he has seen pulmonology and they do not feel this is related to his lungs.     - Inconsistent use of prescribed oxygen and medications    Plan:    - Emphasize importance of consistent oxygen use and medication adherence    - Adjust diuretic regimen as needed based on symptoms and lab results    - he is to f/u with cardiology for optimization of heart failure management

## 2024-05-22 NOTE — Assessment & Plan Note (Signed)
 Declines shingrix, educated on disease process and is aware if he changes his mind to notify office

## 2024-05-22 NOTE — Assessment & Plan Note (Signed)
 He is not wearing his oxygen today. I have placed an order for a smaller portable oxygen tank hopefully this will help him to get to his appts easier

## 2024-05-22 NOTE — Assessment & Plan Note (Signed)
 Continue f/u with cardiology and taking Xarelto 

## 2024-05-24 ENCOUNTER — Ambulatory Visit (HOSPITAL_COMMUNITY)
Admission: RE | Admit: 2024-05-24 | Discharge: 2024-05-24 | Disposition: A | Discharge status: Home / Self Care | Source: Ambulatory Visit | Attending: Family Medicine | Admitting: Family Medicine

## 2024-05-24 VITALS — BP 110/70 | HR 74 | Wt 273.4 lb

## 2024-05-24 DIAGNOSIS — I5022 Chronic systolic (congestive) heart failure: Secondary | ICD-10-CM | POA: Diagnosis present

## 2024-05-24 DIAGNOSIS — Z7984 Long term (current) use of oral hypoglycemic drugs: Secondary | ICD-10-CM | POA: Insufficient documentation

## 2024-05-24 DIAGNOSIS — S81801D Unspecified open wound, right lower leg, subsequent encounter: Secondary | ICD-10-CM

## 2024-05-24 DIAGNOSIS — I472 Ventricular tachycardia, unspecified: Secondary | ICD-10-CM

## 2024-05-24 DIAGNOSIS — I4811 Longstanding persistent atrial fibrillation: Secondary | ICD-10-CM

## 2024-05-24 DIAGNOSIS — I739 Peripheral vascular disease, unspecified: Secondary | ICD-10-CM

## 2024-05-24 DIAGNOSIS — E669 Obesity, unspecified: Secondary | ICD-10-CM | POA: Insufficient documentation

## 2024-05-24 DIAGNOSIS — Z6835 Body mass index (BMI) 35.0-35.9, adult: Secondary | ICD-10-CM | POA: Diagnosis not present

## 2024-05-24 DIAGNOSIS — I255 Ischemic cardiomyopathy: Secondary | ICD-10-CM | POA: Diagnosis present

## 2024-05-24 DIAGNOSIS — I25810 Atherosclerosis of coronary artery bypass graft(s) without angina pectoris: Secondary | ICD-10-CM

## 2024-05-24 DIAGNOSIS — N184 Chronic kidney disease, stage 4 (severe): Secondary | ICD-10-CM

## 2024-05-24 DIAGNOSIS — F0390 Unspecified dementia without behavioral disturbance: Secondary | ICD-10-CM | POA: Diagnosis not present

## 2024-05-24 DIAGNOSIS — Z86718 Personal history of other venous thrombosis and embolism: Secondary | ICD-10-CM | POA: Diagnosis not present

## 2024-05-24 DIAGNOSIS — L97819 Non-pressure chronic ulcer of other part of right lower leg with unspecified severity: Secondary | ICD-10-CM | POA: Insufficient documentation

## 2024-05-24 DIAGNOSIS — N1832 Chronic kidney disease, stage 3b: Secondary | ICD-10-CM

## 2024-05-24 DIAGNOSIS — I2510 Atherosclerotic heart disease of native coronary artery without angina pectoris: Secondary | ICD-10-CM | POA: Insufficient documentation

## 2024-05-24 DIAGNOSIS — S81801A Unspecified open wound, right lower leg, initial encounter: Secondary | ICD-10-CM | POA: Insufficient documentation

## 2024-05-24 DIAGNOSIS — Z95810 Presence of automatic (implantable) cardiac defibrillator: Secondary | ICD-10-CM | POA: Diagnosis not present

## 2024-05-24 DIAGNOSIS — Z951 Presence of aortocoronary bypass graft: Secondary | ICD-10-CM | POA: Insufficient documentation

## 2024-05-24 LAB — CBC
HCT: 34.4 % — ABNORMAL LOW (ref 39.0–52.0)
Hemoglobin: 11.7 g/dL — ABNORMAL LOW (ref 13.0–17.0)
MCH: 29.6 pg (ref 26.0–34.0)
MCHC: 34 g/dL (ref 30.0–36.0)
MCV: 87.1 fL (ref 80.0–100.0)
Platelets: 160 10*3/uL (ref 150–400)
RBC: 3.95 MIL/uL — ABNORMAL LOW (ref 4.22–5.81)
RDW: 16.1 % — ABNORMAL HIGH (ref 11.5–15.5)
WBC: 4.8 10*3/uL (ref 4.0–10.5)
nRBC: 0 % (ref 0.0–0.2)

## 2024-05-24 LAB — BASIC METABOLIC PANEL WITH GFR
Anion gap: 13 (ref 5–15)
BUN: 27 mg/dL — ABNORMAL HIGH (ref 8–23)
CO2: 27 mmol/L (ref 22–32)
Calcium: 9.2 mg/dL (ref 8.9–10.3)
Chloride: 94 mmol/L — ABNORMAL LOW (ref 98–111)
Creatinine, Ser: 2.17 mg/dL — ABNORMAL HIGH (ref 0.61–1.24)
GFR, Estimated: 33 mL/min — ABNORMAL LOW (ref >60)
Glucose, Bld: 94 mg/dL (ref 70–99)
Potassium: 3.5 mmol/L (ref 3.5–5.1)
Sodium: 134 mmol/L — ABNORMAL LOW (ref 135–145)

## 2024-05-24 LAB — BRAIN NATRIURETIC PEPTIDE: B Natriuretic Peptide: 677.9 pg/mL — ABNORMAL HIGH (ref 0.0–100.0)

## 2024-05-24 MED ORDER — DOXYCYCLINE HYCLATE 100 MG PO CAPS: 100.0000 mg | ORAL_CAPSULE | Freq: Two times a day (BID) | ORAL | 0 refills | Status: AC

## 2024-05-24 NOTE — Patient Instructions (Addendum)
 Thank you for coming in today  If you had labs drawn today, any labs that are abnormal the clinic will call you No news is good news  You have been referred to  wound clinic and PAD clinic, their office will call you for further appointment details   You were ordered ABI for right leg their office will call and make appointment  You were given a scale  Medications: Doxycycline  100 mg 1 tablet twice daily for 7 days Its ok to use Aquaphor and eucerin cream  Follow up appointments:  Your physician recommends that you schedule a follow-up appointment in:  3 months in clinic   Do the following things EVERYDAY: Weigh yourself in the morning before breakfast. Write it down and keep it in a log. Take your medicines as prescribed Eat low salt foods--Limit salt (sodium) to 2000 mg per day.  Stay as active as you can everyday Limit all fluids for the day to less than 2 liters   At the [facility], you and your health needs are our priority. As part of our continuing mission to provide you with exceptional heart care, we have created designated Provider Care Teams. These Care Teams include your primary Cardiologist (physician) and Advanced Practice Providers (APPs- Physician Assistants and Nurse Practitioners) who all work together to provide you with the care you need, when you need it.   You may see any of the following providers on your designated Care Team at your next follow up: Dr Jules Oar Dr Peder Bourdon Dr. Mimi Alt, NP Ruddy Corral, [place] [facility] [place], [place] Dennise Fitz, NP Luster Salters, PharmD   Please be sure to bring in all your medications bottles to every appointment.    Thank you for choosing [facility]  If you have any questions or concerns before your next appointment please send us  a message through [place] or call our office at [phone].    TO LEAVE A  MESSAGE FOR THE NURSE SELECT OPTION 2, PLEASE LEAVE A MESSAGE INCLUDING: YOUR NAME DATE OF BIRTH CALL BACK NUMBER REASON FOR CALL**this is important as we prioritize the call backs  YOU WILL RECEIVE A CALL BACK THE SAME DAY AS LONG AS YOU CALL BEFORE 4:00 PM

## 2024-05-25 ENCOUNTER — Institutional Professional Consult (permissible substitution): Payer: Self-pay | Admitting: Psychology

## 2024-05-25 ENCOUNTER — Ambulatory Visit: Payer: Self-pay

## 2024-05-25 ENCOUNTER — Ambulatory Visit (HOSPITAL_COMMUNITY): Payer: Self-pay | Admitting: Family Medicine

## 2024-05-25 ENCOUNTER — Encounter (HOSPITAL_COMMUNITY): Payer: Self-pay

## 2024-05-31 ENCOUNTER — Ambulatory Visit (HOSPITAL_COMMUNITY): Attending: Family Medicine

## 2024-06-01 ENCOUNTER — Encounter: Payer: Self-pay | Admitting: Psychology

## 2024-06-01 ENCOUNTER — Encounter (HOSPITAL_BASED_OUTPATIENT_CLINIC_OR_DEPARTMENT_OTHER): Admitting: General Surgery

## 2024-06-04 ENCOUNTER — Ambulatory Visit: Attending: Physician Assistant | Admitting: Physician Assistant

## 2024-06-05 NOTE — Progress Notes (Signed)
 This encounter was created in error - please disregard.

## 2024-06-08 ENCOUNTER — Ambulatory Visit

## 2024-06-08 DIAGNOSIS — R188 Other ascites: Secondary | ICD-10-CM | POA: Diagnosis not present

## 2024-06-08 DIAGNOSIS — Z950 Presence of cardiac pacemaker: Secondary | ICD-10-CM | POA: Diagnosis not present

## 2024-06-08 DIAGNOSIS — I5022 Chronic systolic (congestive) heart failure: Secondary | ICD-10-CM

## 2024-06-08 DIAGNOSIS — R4182 Altered mental status, unspecified: Secondary | ICD-10-CM | POA: Diagnosis not present

## 2024-06-08 DIAGNOSIS — S81801A Unspecified open wound, right lower leg, initial encounter: Secondary | ICD-10-CM | POA: Diagnosis not present

## 2024-06-09 ENCOUNTER — Telehealth: Payer: Self-pay | Admitting: *Deleted

## 2024-06-09 ENCOUNTER — Ambulatory Visit (HOSPITAL_BASED_OUTPATIENT_CLINIC_OR_DEPARTMENT_OTHER): Admitting: General Surgery

## 2024-06-09 DIAGNOSIS — S81801A Unspecified open wound, right lower leg, initial encounter: Secondary | ICD-10-CM | POA: Diagnosis not present

## 2024-06-09 DIAGNOSIS — Z950 Presence of cardiac pacemaker: Secondary | ICD-10-CM | POA: Diagnosis not present

## 2024-06-09 DIAGNOSIS — I4821 Permanent atrial fibrillation: Secondary | ICD-10-CM | POA: Diagnosis not present

## 2024-06-09 DIAGNOSIS — F0390 Unspecified dementia without behavioral disturbance: Secondary | ICD-10-CM | POA: Diagnosis not present

## 2024-06-09 DIAGNOSIS — E1151 Type 2 diabetes mellitus with diabetic peripheral angiopathy without gangrene: Secondary | ICD-10-CM | POA: Diagnosis not present

## 2024-06-09 DIAGNOSIS — I2510 Atherosclerotic heart disease of native coronary artery without angina pectoris: Secondary | ICD-10-CM | POA: Diagnosis not present

## 2024-06-09 DIAGNOSIS — K761 Chronic passive congestion of liver: Secondary | ICD-10-CM | POA: Diagnosis not present

## 2024-06-09 DIAGNOSIS — F40240 Claustrophobia: Secondary | ICD-10-CM | POA: Diagnosis not present

## 2024-06-09 DIAGNOSIS — I5023 Acute on chronic systolic (congestive) heart failure: Secondary | ICD-10-CM | POA: Diagnosis not present

## 2024-06-09 DIAGNOSIS — I255 Ischemic cardiomyopathy: Secondary | ICD-10-CM | POA: Diagnosis not present

## 2024-06-09 DIAGNOSIS — I130 Hypertensive heart and chronic kidney disease with heart failure and stage 1 through stage 4 chronic kidney disease, or unspecified chronic kidney disease: Secondary | ICD-10-CM | POA: Diagnosis not present

## 2024-06-09 DIAGNOSIS — D649 Anemia, unspecified: Secondary | ICD-10-CM | POA: Diagnosis not present

## 2024-06-09 DIAGNOSIS — M1990 Unspecified osteoarthritis, unspecified site: Secondary | ICD-10-CM | POA: Diagnosis not present

## 2024-06-09 DIAGNOSIS — I872 Venous insufficiency (chronic) (peripheral): Secondary | ICD-10-CM | POA: Diagnosis not present

## 2024-06-09 DIAGNOSIS — Z1152 Encounter for screening for COVID-19: Secondary | ICD-10-CM | POA: Diagnosis not present

## 2024-06-09 DIAGNOSIS — R600 Localized edema: Secondary | ICD-10-CM | POA: Diagnosis not present

## 2024-06-09 DIAGNOSIS — R188 Other ascites: Secondary | ICD-10-CM | POA: Diagnosis not present

## 2024-06-09 DIAGNOSIS — E669 Obesity, unspecified: Secondary | ICD-10-CM | POA: Diagnosis not present

## 2024-06-09 DIAGNOSIS — N1832 Chronic kidney disease, stage 3b: Secondary | ICD-10-CM | POA: Diagnosis not present

## 2024-06-09 DIAGNOSIS — E871 Hypo-osmolality and hyponatremia: Secondary | ICD-10-CM | POA: Diagnosis not present

## 2024-06-09 DIAGNOSIS — E860 Dehydration: Secondary | ICD-10-CM | POA: Diagnosis not present

## 2024-06-09 DIAGNOSIS — G9349 Other encephalopathy: Secondary | ICD-10-CM | POA: Diagnosis not present

## 2024-06-09 DIAGNOSIS — E1122 Type 2 diabetes mellitus with diabetic chronic kidney disease: Secondary | ICD-10-CM | POA: Diagnosis not present

## 2024-06-09 DIAGNOSIS — E785 Hyperlipidemia, unspecified: Secondary | ICD-10-CM | POA: Diagnosis not present

## 2024-06-09 DIAGNOSIS — I959 Hypotension, unspecified: Secondary | ICD-10-CM | POA: Diagnosis not present

## 2024-06-09 DIAGNOSIS — Z91199 Patient's noncompliance with other medical treatment and regimen due to unspecified reason: Secondary | ICD-10-CM | POA: Diagnosis not present

## 2024-06-09 DIAGNOSIS — M109 Gout, unspecified: Secondary | ICD-10-CM | POA: Diagnosis not present

## 2024-06-09 DIAGNOSIS — R4182 Altered mental status, unspecified: Secondary | ICD-10-CM | POA: Diagnosis not present

## 2024-06-09 DIAGNOSIS — Z6836 Body mass index (BMI) 36.0-36.9, adult: Secondary | ICD-10-CM | POA: Diagnosis not present

## 2024-06-09 NOTE — Telephone Encounter (Signed)
 Alert received from [employer]:   Unscheduled remote transmission:  Scheduled non-billable, pt currenlty hospitalized Known persistent AF, overall controlled rates, Eliquis  per EPIC Pt had a VT episode 03/17/24 @ 06:55, HR 231, EGM c/w AF and likely sustained VT, rate slow with 2 bursts of ATP - route to triage for awareness Normal device function ___________________________________________________________________________________________  Unsuccessful outreach made to patient via phone numbers listed in contact info. LMTCB  Patient still needs to be notified of 6 month driving restrictions after receiving therapy from his device.  Patient's home monitor noted to be offline in [place].   Patient will need to be contacted once discharged from hospital to ascertain the status of his bedside monitor and if remote transmissions can resume.

## 2024-06-10 DIAGNOSIS — M7989 Other specified soft tissue disorders: Secondary | ICD-10-CM | POA: Diagnosis not present

## 2024-06-10 DIAGNOSIS — R0602 Shortness of breath: Secondary | ICD-10-CM | POA: Diagnosis not present

## 2024-06-10 NOTE — Telephone Encounter (Signed)
Patient is still admitted in hospital.

## 2024-06-11 DIAGNOSIS — R748 Abnormal levels of other serum enzymes: Secondary | ICD-10-CM | POA: Diagnosis not present

## 2024-06-11 DIAGNOSIS — I5022 Chronic systolic (congestive) heart failure: Secondary | ICD-10-CM | POA: Diagnosis not present

## 2024-06-11 LAB — CUP PACEART REMOTE DEVICE CHECK
Battery Remaining Longevity: 27 mo
Battery Voltage: 2.93 V
Brady Statistic RA Percent Paced: 1.35 %
Brady Statistic RV Percent Paced: 3.71 %
Date Time Interrogation Session: 20250617051948
HighPow Impedance: 46 Ohm
Implantable Lead Connection Status: 753985
Implantable Lead Connection Status: 753985
Implantable Lead Implant Date: 20120926
Implantable Lead Implant Date: 20120926
Implantable Lead Location: 753859
Implantable Lead Location: 753860
Implantable Lead Model: 5076
Implantable Pulse Generator Implant Date: 20190625
Lead Channel Impedance Value: 1178 Ohm
Lead Channel Impedance Value: 399 Ohm
Lead Channel Impedance Value: 494 Ohm
Lead Channel Pacing Threshold Amplitude: 0.75 V
Lead Channel Pacing Threshold Pulse Width: 0.4 ms
Lead Channel Sensing Intrinsic Amplitude: 0.25 mV
Lead Channel Sensing Intrinsic Amplitude: 0.25 mV
Lead Channel Sensing Intrinsic Amplitude: 4.75 mV
Lead Channel Sensing Intrinsic Amplitude: 4.75 mV
Lead Channel Setting Pacing Amplitude: 0.5 V
Lead Channel Setting Pacing Amplitude: 2 V
Lead Channel Setting Pacing Pulse Width: 0.03 ms
Lead Channel Setting Sensing Sensitivity: 0.3 mV
Zone Setting Status: 755011
Zone Setting Status: 755011

## 2024-06-11 NOTE — Telephone Encounter (Signed)
 Pt currently hospitalized in [place] .  Pt's device was interrogated upon arrival to ED in [place]  (transmission received was from that interrogation).  Pt has scheduled follow up with Dr. Alvenia Aus 06/22/2024.   Pt will need EP follow up.  Will send to scheduling.

## 2024-06-12 DIAGNOSIS — I5022 Chronic systolic (congestive) heart failure: Secondary | ICD-10-CM | POA: Diagnosis not present

## 2024-06-12 DIAGNOSIS — R748 Abnormal levels of other serum enzymes: Secondary | ICD-10-CM | POA: Diagnosis not present

## 2024-06-13 DIAGNOSIS — R748 Abnormal levels of other serum enzymes: Secondary | ICD-10-CM | POA: Diagnosis not present

## 2024-06-13 DIAGNOSIS — I5022 Chronic systolic (congestive) heart failure: Secondary | ICD-10-CM | POA: Diagnosis not present

## 2024-06-14 ENCOUNTER — Telehealth: Payer: Self-pay

## 2024-06-14 DIAGNOSIS — R748 Abnormal levels of other serum enzymes: Secondary | ICD-10-CM | POA: Diagnosis not present

## 2024-06-14 DIAGNOSIS — I5022 Chronic systolic (congestive) heart failure: Secondary | ICD-10-CM | POA: Diagnosis not present

## 2024-06-14 NOTE — Telephone Encounter (Signed)
 Copied from CRM [id number]. Topic: General - Other >> Jun 11, 2024  1:55 PM Marissa P wrote: Reason for CRM: Wife called stating that Home health care will be needed to start him off to get situated from being in the Hospital please along with some physical therapy, please he's currently at [facility] in [place], [place] . The doctors he's sees there are Doctor Mattieu and Doctor Tobie, the hospital can be reached [phone]. The patient will be back in town this Monday 06/14/2024 and would like the home care to be set up and process started as soon as possible please. Wife and or patient can be contacted at the preferred contact that is on file for this patient at [phone], please and thank you.  Called Connor Burns to try to scheudle Connor Burns for a follow up appt. He is currently still admitted, wife was informed to call us  when he was discharged.

## 2024-06-15 DIAGNOSIS — R748 Abnormal levels of other serum enzymes: Secondary | ICD-10-CM | POA: Diagnosis not present

## 2024-06-15 DIAGNOSIS — I5022 Chronic systolic (congestive) heart failure: Secondary | ICD-10-CM | POA: Diagnosis not present

## 2024-06-15 NOTE — Discharge Summary (Signed)
 [facility] MEDICAL [facility] Medicine - Discharge Summary   NAME: Connor Burns DOB: 1956/11/25 MRN: [id number] Location: [place] G711/G711-01  ADMISSION  Date: 06/08/2024  1:27 AM   DISCHARGE Date: 06/15/2024  Attending of Record: Deward MARLA Frederico Mickey., MD Resident of Record: Lucrecia Lipa, DO  Admitting Physician: Deward MARLA Frederico Mickey., MD  Outpatient PCP:  Freddrick Johns, MD  Chief Complaint on Admission: Altered Mental Status   At a Glance for Next Outpatient Provider  Chronic Medications Changed or Stopped? - NO  New Medications Prescribed? - YES  Test Results Pending at Time of Discharge? - YES  Need for Repeat Tests within 2-4 Weeks of Discharge (e.g. electrolytes)? - NO  Incidental Findings Needing Outpatient Follow-Up? [facility] Course: The patient is a 68 y.o. male with Principal Problem (Resolved):   Dehydration Active Problems:   * No active hospital problems. *   The following issues were addressed during this hospitalization:  Altered Mental Status  Dementia  - progressive dementia, recently diagnosed  - missing person who drove from [place] to [place]  - ddx: encephalopathy, rhabdo, infection, dehydration, hemolysis  - A&Ox1; baseline is around A&Ox3  - home meds: donepezil  10mg  nightly  - CT head negative for acute abnormalities  - Labs on admission: Scr 1.81 Na 135  - patient without home meds for several days  - monitor elctrolytes, IV fluids, and resume home meds  - PRN agitation meds in place  - Care Coordination consulted for discharge planning  - tolerating risperidol well, continue at home - Unable to do brain MRI due to claustrophobia.    Hyperbilirubinemia  Congestive Hepatopathy  - d/t CHF  - Tbili 8.5 on admission  - Subsequent bili 9.1 > 8.9 > 8.3 > 7.4 - scleral icterus present  - abdominal exam unremarkable  - elevated Alk phos noted but AST and ALT wnl - cont to trend - CT  imaging shows surgically absent gallbladder, wnl liver and no intra/extra hepatic duct dilation  - Hepatitis panel negative, haptoglobin wnl, ammonia wnl - LDH mildly elevated but trending down  - GGT elevated  - acetaminophen , alcohol, and urine tox negative  - direct bili >indirect bili. has had elevated bilirubin levels in the past but not this degree  - RUQ US  wnl  - GI consulted - rec appreciated, can obtain an MRCP if compatible with ICD. Patient unable to tolerate imaging - folate and B12 wnl - Iron studies show low iron and %sat with low hgb consistent with IDA - ordered Questrant to help eliminate bile  - Cardiology consulted - ceruloplasmin, alk phos enzymes, antimitochondrial Ab, actin pending    CHF  - limited Echo: EF 41-45% improved from prior  - mild valvular disease  - cont home meds  - Cardiology consulted   Pleural Effusions  - chronic per wife  - had thoracentesis recently  - not requiring oxygen at this time but family reports has oxygen at home  - monitor saturations  - on torsemide  40mg  BID at home    CKD  - likely stage 3a per wife  - prev labs noted scr creatinine averaging between 2.0 - 2.5  - 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 He was clinically and hemodynamically stable at discharge.   Disease-Specific Checklist: N/A  Procedures Performed: None  Consults: CONSULT TO MEDICATION HISTORY SPECIALIST CONSULT TO MEDICATION HISTORY SPECIALIST IP CONSULT TO CARE COORDINATOR IP CONSULT TO CARE COORDINATOR IP CONSULT TO CARE COORDINATOR IP CONSULT TO GASTROENTEROLOGY IP CONSULT TO CARDIOLOGY   Physical Exam  Performed the Day of Discharge: Visit Vitals BP 114/69  Pulse 79  Temp 97.5 F (36.4 C)  Resp 18  Ht 74 (1.88 m)  Wt 115.7 kg (255 lb 1.2 oz)  SpO2 95%  BMI 32.75 kg/m    GEN: Conversant, no acute distress EYES: +scleral icterus HENT: Normocephalic, atraumatic. Mucous membranes normal Neck: Supple, no JVD CV: Regular rate, regular rhythm, no rubs/gallops PULM: CTA bilaterally, no increased WOB, no accessory respiratory muscle use ABD: Bowel sounds normoactive, soft, non-tender, non-distended EXT: No lower extremity edema, no cyanosis/clubbing, right arm swollen and nontender SKIN: jaundiced NEURO: Normal speech and mentation, moves all extremities  PSYCH: alert, oriented to person, appropriate affect  Condition at Discharge: Stable  Discharge Destination: Home  Recommended Diet: Diet, Cardiac Effective Now 2000 kcal; 2000 ML FLUID Nutrition Supplements Breakfast/Lunch Juven Orange; PO Nutrition Supplements Breakfast/Dinner Ensure High Protein Strawberry; PO Diet, Cardiac   Discharge Medication Reconciliation:   Medication List     START taking these medications    risperiDONE 0.5 MG tablet Commonly known as: RisperDAL Take 1 tablet (0.5 mg total) by mouth 2 (two) times a day       CONTINUE taking these medications    carvediloL  3.125 MG tablet Commonly known as: COREG    cholecalciferol 125 mcg tablet Commonly known as: VITAMIN D3   cyanocobalamin  1000 MCG tablet   donepeziL  10 MG tablet Commonly known as: ARICEPT    Eliquis  5 mg tablet Generic drug: apixaban    ferrous sulfate 325 (65 FE) MG tablet, delayed release   Jardiance  10 mg tablet Generic drug: empagliflozin    potassium chloride  SA 20 MEQ extended release tablet Commonly known as: K-DUR/KLOR-CON    silver  sulfADIAZINE  1 % cream Commonly known as: SILVADENE    torsemide  20 MG tablet Commonly known as: DEMADEX          Where to Get Your Medications     These medications were  sent to [facility], [place] - [address]  [address] [place] [address]    Phone: [phone]  risperiDONE 0.5 MG tablet    Discharge Instructions: The patient was educated on warning signs regarding his current medical conditions. If any of these issues were to arise or worsen, he was instructed to contact a medical provider or seek further evaluation in the emergency room.  Follow-up Information     Follow up With Specialties Details Why Contact Info   Pcp No, MD          No future appointments.    Plan discussed with and approved by attending Dr. Deward Frederico Raddle.  Lucrecia Loran HAS, [facility] PGY-1 Family Medicine 06/15/24  Attending Attestation     ------------------------------------------------------------------------------- Attestation signed by Deward MARLA Frederico Raddle., MD at 06/15/2024  8:01  PM Discharge Summary Attestation  The resident and I reviewed and confirmed the chief complaint, history of present illness, past medical history, family history, social history, medications, allergies and review of systems.  I examined the patient and confirm the findings by the resident. I have reviewed and discussed the diagnostic tests, labs, and assessment and treatment plan with the resident and agree with the findings and the plan of care as documented in the resident's note.    Attending Comments: Agree with resident discussion  Patient to transport home with family back to [place]   Time Coordinating Discharge 40 minutes  -------------------------------------------------------------------------------

## 2024-06-17 ENCOUNTER — Ambulatory Visit: Payer: Self-pay | Admitting: Cardiology

## 2024-06-22 ENCOUNTER — Ambulatory Visit: Attending: Cardiovascular Disease | Admitting: Cardiovascular Disease

## 2024-06-22 ENCOUNTER — Telehealth: Payer: Self-pay

## 2024-06-22 NOTE — Telephone Encounter (Signed)
 [name] the wife called to follow up on a message she received. I relayed the message and then once I told her the provider would like to schedule a hospital follow up the call was disconnected. I tried to call her back multiple times with no success. Please assist further

## 2024-06-22 NOTE — Telephone Encounter (Signed)
 Copied from CRM [id number]. Topic: Clinical - Home Health Verbal Orders >> Jun 22, 2024 11:17 AM Charlet HERO wrote: Caller/Agency: [name] Fair wife Callback Number: [phone] Service Requested: Physical Therapy Frequency: N/A Any new concerns about the patient? Yes Wife is calling to request Home health care for the patient she is stating that the hospital said he would need PT  Called patient to offer her a appt for a hospital f/u and to discuss PT.

## 2024-06-29 ENCOUNTER — Encounter (HOSPITAL_BASED_OUTPATIENT_CLINIC_OR_DEPARTMENT_OTHER): Attending: General Surgery | Admitting: General Surgery

## 2024-06-29 DIAGNOSIS — L97812 Non-pressure chronic ulcer of other part of right lower leg with fat layer exposed: Secondary | ICD-10-CM | POA: Diagnosis not present

## 2024-06-29 DIAGNOSIS — I5022 Chronic systolic (congestive) heart failure: Secondary | ICD-10-CM | POA: Insufficient documentation

## 2024-06-29 DIAGNOSIS — N1832 Chronic kidney disease, stage 3b: Secondary | ICD-10-CM | POA: Insufficient documentation

## 2024-06-29 DIAGNOSIS — L97819 Non-pressure chronic ulcer of other part of right lower leg with unspecified severity: Secondary | ICD-10-CM | POA: Insufficient documentation

## 2024-06-29 DIAGNOSIS — F03A0 Unspecified dementia, mild, without behavioral disturbance, psychotic disturbance, mood disturbance, and anxiety: Secondary | ICD-10-CM | POA: Insufficient documentation

## 2024-06-29 DIAGNOSIS — I739 Peripheral vascular disease, unspecified: Secondary | ICD-10-CM | POA: Diagnosis not present

## 2024-06-29 DIAGNOSIS — L97422 Non-pressure chronic ulcer of left heel and midfoot with fat layer exposed: Secondary | ICD-10-CM | POA: Diagnosis not present

## 2024-06-29 DIAGNOSIS — L97829 Non-pressure chronic ulcer of other part of left lower leg with unspecified severity: Secondary | ICD-10-CM | POA: Insufficient documentation

## 2024-06-29 DIAGNOSIS — R600 Localized edema: Secondary | ICD-10-CM | POA: Insufficient documentation

## 2024-06-29 DIAGNOSIS — L97519 Non-pressure chronic ulcer of other part of right foot with unspecified severity: Secondary | ICD-10-CM | POA: Insufficient documentation

## 2024-06-29 DIAGNOSIS — L97822 Non-pressure chronic ulcer of other part of left lower leg with fat layer exposed: Secondary | ICD-10-CM | POA: Diagnosis not present

## 2024-06-29 DIAGNOSIS — L97429 Non-pressure chronic ulcer of left heel and midfoot with unspecified severity: Secondary | ICD-10-CM | POA: Insufficient documentation

## 2024-07-02 ENCOUNTER — Ambulatory Visit (INDEPENDENT_AMBULATORY_CARE_PROVIDER_SITE_OTHER): Payer: Self-pay | Admitting: Family Medicine

## 2024-07-02 ENCOUNTER — Encounter

## 2024-07-02 ENCOUNTER — Encounter: Payer: Self-pay | Admitting: Family Medicine

## 2024-07-02 VITALS — BP 124/80 | HR 74 | Temp 98.6°F | Ht 74.0 in | Wt 266.0 lb

## 2024-07-02 DIAGNOSIS — N289 Disorder of kidney and ureter, unspecified: Secondary | ICD-10-CM

## 2024-07-02 DIAGNOSIS — F03A0 Unspecified dementia, mild, without behavioral disturbance, psychotic disturbance, mood disturbance, and anxiety: Secondary | ICD-10-CM

## 2024-07-02 DIAGNOSIS — Z950 Presence of cardiac pacemaker: Secondary | ICD-10-CM | POA: Diagnosis not present

## 2024-07-02 DIAGNOSIS — I480 Paroxysmal atrial fibrillation: Secondary | ICD-10-CM

## 2024-07-02 DIAGNOSIS — I110 Hypertensive heart disease with heart failure: Secondary | ICD-10-CM

## 2024-07-02 DIAGNOSIS — L97519 Non-pressure chronic ulcer of other part of right foot with unspecified severity: Secondary | ICD-10-CM | POA: Diagnosis not present

## 2024-07-02 DIAGNOSIS — L97829 Non-pressure chronic ulcer of other part of left lower leg with unspecified severity: Secondary | ICD-10-CM | POA: Diagnosis not present

## 2024-07-02 DIAGNOSIS — D6869 Other thrombophilia: Secondary | ICD-10-CM

## 2024-07-02 DIAGNOSIS — N1832 Chronic kidney disease, stage 3b: Secondary | ICD-10-CM

## 2024-07-02 DIAGNOSIS — N183 Chronic kidney disease, stage 3 unspecified: Secondary | ICD-10-CM

## 2024-07-02 DIAGNOSIS — I5020 Unspecified systolic (congestive) heart failure: Secondary | ICD-10-CM | POA: Diagnosis not present

## 2024-07-02 DIAGNOSIS — J90 Pleural effusion, not elsewhere classified: Secondary | ICD-10-CM

## 2024-07-02 DIAGNOSIS — L97429 Non-pressure chronic ulcer of left heel and midfoot with unspecified severity: Secondary | ICD-10-CM | POA: Diagnosis not present

## 2024-07-02 DIAGNOSIS — I130 Hypertensive heart and chronic kidney disease with heart failure and stage 1 through stage 4 chronic kidney disease, or unspecified chronic kidney disease: Secondary | ICD-10-CM

## 2024-07-02 DIAGNOSIS — L97819 Non-pressure chronic ulcer of other part of right lower leg with unspecified severity: Secondary | ICD-10-CM | POA: Diagnosis not present

## 2024-07-02 MED ORDER — EMPAGLIFLOZIN 10 MG PO TABS: 10.0000 mg | ORAL_TABLET | Freq: Every day | ORAL | 1 refills | Status: DC

## 2024-07-02 MED ORDER — APIXABAN 5 MG PO TABS: 5.0000 mg | ORAL_TABLET | Freq: Two times a day (BID) | ORAL | 3 refills | Status: DC

## 2024-07-02 MED ORDER — DONEPEZIL HCL 10 MG PO TABS
10.0000 mg | ORAL_TABLET | Freq: Every day | ORAL | 3 refills | Status: DC
Start: 2024-07-02 — End: 2024-10-18

## 2024-07-02 NOTE — Progress Notes (Signed)
 I,[name], CMA,acting as a Neurosurgeon for [employer], NP.,have documented all relevant documentation on the behalf of Bruna Creighton, NP,as directed by  Bruna Creighton, NP while in the presence of Bruna Creighton, NP.  Subjective:  Patient ID: Connor Burns. , male    DOB: 1956-05-08 , 68 y.o.   MRN: [id number]  Chief Complaint  Patient presents with   Hospitalization Follow-up    HPI  Follow up Hospitalization  Patient was admitted to [facility] in [place] on 06/07/24 and discharged on 06/15/24. He was treated for Altered mental state, encephalopathy, rhabdo,  dehydration, hemolysis renal lesion, pleural effusion. Treatment for this included hydration. Telephone follow up was done on 06/22/2024 He reports good compliance with treatment. He reports this condition is improved.  ----------------------------------------------------------------------------------------- -        Past Medical History:  Diagnosis Date   Acute osteomyelitis of ankle and foot 05/17/2014   Acute systolic heart failure (HCC) 09/12/2023   Arthritis 10/27/2020   Wrist   Atherosclerosis of native arteries of the extremities with ulceration 06/15/2014   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   veral times a day for 10 minutes each time; The patient was also instructed to hold his Xarelto  tonight and tomorrow night.  He is to restart his Xarelto  on Sunday night.   CAD (coronary artery disease)    CHF (congestive heart failure) 01/14/2017   Intermittent CHF with lower extremity edema.  I have given him Lasix  to take on a p.r.n. basis.Formatting of this note might be different from the original. Last Assessment & Plan: Formatting of this note might be different from the original. Intermittent CHF with lower extremity edema.  I have given him Las   Chronic kidney disease 10/27/2020   Stage II   Congestive cardiomyopathy 01/14/2017   Cardiomyopathy with LV dysfunction.Last  ejection fraction 40-45% by echocardiography in 2012.Patient currently not taking beta-blocker Ace inhibitor for some reason.We will repeat echocardiography to evaluate LV function then determine the need for afterload reduction and beta-blocker therapy.[place]   Coronary atherosclerosis 01/14/2017   Status remote CABG in 2004 in [place] .Left internal mammary artery to the obtuse marginal.Free Right internal mammary artery to the LAD.Saphenous vein graft to OM 2.Saphenous vein graft to the RCA.  Asymptomatic without chest pain or shortness of breath.Last nuclear stress test May 2018:  Large   DVT (deep venous thrombosis)    Dyslipidemia 05/07/2018   Hematuria 08/10/2015   Hypertension    ICD (implantable cardioverter-defibrillator) battery depletion 06/16/2018   Status post dual-chamber ICD generator change 06/16/2018.Last Assessment & Plan: Formatting of this note might be different from the original.Left infraclavicular incision intact with Dermabond.  There is no pocket hematoma present.  No drainage noted.Device interrogation this a.m. reveals appropriate device function.   Ischemic cardiomyopathy 06/18/2016   Echo 09/14/2011 showing LA 4.9cm, EF 40-45%.  Mild to moderate decreased LV systolic function.  Mild concentric LVH.  LA is moderately dilated.  Mild aortic cusp sclerosis. Aortic valve appears to be bicuspid.B. Concerning for potential dysrhythmia (ventricular tachycardia) especially given documented nonsustained ventricular tachy   Mild dementia, unclear etiology 12/30/2022   Myocardial infarction    Obesity (BMI 30-39.9) 08/10/2015   Onychomycosis 05/10/2014   Painful legs and moving toes of left foot 05/10/2014   Paroxysmal atrial fibrillation 08/08/2015   A. Paroxysmal.B. On Sotalol therapy.Last Assessment & Plan: History of present atrial fibrillation.  Patient will 80 mg twice daily.Also on rivaroxaban  20 mg a day.Patient again refused  an ECG.I explained to the patient, that the  absence of an EKG, I canno   Peripheral vascular disease    Shortness of breath 10/27/2020   Exertional   Stroke 10/27/2020   Right frontal infarct per neuroimaging dating back at least to 2013   Syncope 01/14/2017   In the setting of ischemic cardiomyopathy. B. Concerning for potential dysrhythmia (ventricular tachycardia) especially given documented nonsustained ventricular tachycardia - EPS 9/25/201 showing reproducibly inducible nonsustained monomorphic VT up to 10 beat.  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 In the setting of ischemic cardiomyopathy. B. Concerning for potential dysrhythmia (ventricular tachycardia) especially given documented nonsustained ventricular tachycardia - EPS 9/25/201 showing reproducibly inducible nonsustained monomorphic VT up to 10 beat.  Reproducibly inducible nonsustained polymorphic VT.  Inducible VF wi     Family History  Problem Relation Age of Onset   Heart disease Mother        before age 20   Hyperlipidemia Mother    Hypertension Mother    Heart attack Mother    Hyperlipidemia Father    Hypertension Father    Memory loss Brother    Memory loss Brother    Memory loss Half-Sister    Memory loss Maternal Aunt      Current Outpatient Medications:    atorvastatin  (LIPITOR) 20 MG tablet, Take 1 tablet (20 mg total) by mouth daily., Disp: 30 tablet, Rfl: 2   carvedilol  (COREG ) 3.125 MG tablet, Take 1 tablet (3.125 mg total) by mouth 2 (two) times daily., Disp: 60 tablet, Rfl: 2   cyanocobalamin  (VITAMIN B12) 1000 MCG tablet, Take 1 tablet (1,000 mcg total) by mouth daily., Disp: 30 tablet, Rfl: 2   isosorbide  mononitrate (IMDUR ) 30 MG 24 hr tablet, Take 30 mg by mouth daily. Patient is taking 1/2 tablet daily per instructions on his prescription bottle, Disp: , Rfl:    potassium chloride  SA (KLOR-CON  M)  20 MEQ tablet, Take 60 mEq by mouth 2 (two) times daily., Disp: , Rfl:    risperiDONE  (RISPERDAL ) 0.5 MG tablet, Take 0.5 mg by mouth daily., Disp: , Rfl:    silver  sulfADIAZINE  (SILVADENE ) 1 % cream, Apply 1 Application topically 2 (two) times daily. Clean site of wound and apply cream., Disp: 400 g, Rfl: 1   SV IRON 325 (65 Fe) MG tablet, Take 325 mg by mouth 3 (three) times a week., Disp: , Rfl:    torsemide  (DEMADEX ) 20 MG tablet, Take 2 tablets (40 mg total) by mouth 2 (two) times daily., Disp: 180 tablet, Rfl: 2   apixaban  (ELIQUIS ) 5 MG TABS tablet, Take 1 tablet (5 mg total) by mouth 2 (two) times daily., Disp: 60 tablet, Rfl: 3   donepezil  (ARICEPT ) 10 MG tablet, Take 1 tablet (10 mg total) by mouth daily., Disp: 90 tablet, Rfl: 3   empagliflozin  (JARDIANCE ) 10 MG TABS tablet, Take 1 tablet (10 mg total) by mouth daily., Disp: 90 tablet, Rfl: 1   Allergies  Allergen Reactions   Clopidogrel Anaphylaxis and Other (See Comments)    Wierd, like out of body thing Out of body experience    Plavix [Clopidogrel Bisulfate]      Review of Systems  Constitutional: Negative.   HENT: Negative.    Respiratory: Negative.    Cardiovascular:  Positive for leg swelling. Negative for chest pain and palpitations.  Musculoskeletal:  Positive for  gait problem.       Had a wheelchair today  Neurological:  Negative for light-headedness and headaches.  Psychiatric/Behavioral:  Positive for behavioral problems.      Today's Vitals   07/02/24 0939  BP: 124/80  Pulse: 74  Temp: 98.6 F (37 C)  TempSrc: Oral  Weight: 266 lb (120.7 kg)  Height: 6' 2 (1.88 m)  PainSc: 0-No pain   Body mass index is 34.15 kg/m.  Wt Readings from Last 3 Encounters:  07/07/24 271 lb (122.9 kg)  07/02/24 266 lb (120.7 kg)  05/24/24 273 lb 6.4 oz (124 kg)    The ASCVD Risk score (Arnett DK, et al., 2019) failed to calculate for the following reasons:   Risk score cannot be calculated because patient has a  medical history suggesting prior/existing ASCVD  Objective:  Physical Exam HENT:     Head: Normocephalic.  Cardiovascular:     Rate and Rhythm: Normal rate.     Comments: Has a pacemaker Pulmonary:     Effort: Pulmonary effort is normal.     Breath sounds: Normal breath sounds.  Musculoskeletal:        General: Tenderness present.  Neurological:     Mental Status: He is alert.         Assessment And Plan:  Mild dementia without behavioral disturbance, psychotic disturbance, mood disturbance, or anxiety, unspecified dementia type (HCC) Assessment & Plan: Progressive, went missing in [place] .  Had CT head in [place]  no abnormalities found.  He was on Aricept  10 mg every night and risperidone  0.5 mg twice daily added to his treatment regimen.  Orders: -     Donepezil  HCl; Take 1 tablet (10 mg total) by mouth daily.  Dispense: 90 tablet; Refill: 3  Acquired thrombophilia (HCC) Assessment & Plan: On eliquis  5 mg BID d/t PAF.  Orders: -     Apixaban ; Take 1 tablet (5 mg total) by mouth 2 (two) times daily.  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  He was discharged with cardiac diet 2000 mL fluid per  Discharge notes.  Orders: -     Empagliflozin ; Take 1 tablet (10 mg total) by mouth daily.  Dispense: 90 tablet; Refill: 1    Return if symptoms worsen or fail to improve, for keep next appt.  Patient was given opportunity to ask questions. Patient verbalized understanding of the plan and was able to repeat key elements of the plan. All questions were answered to their satisfaction.    I, Bruna Creighton, NP, have reviewed all documentation for this visit. The documentation on 07/12/2024 for the exam, diagnosis, procedures, and orders are all accurate and complete.     IF YOU HAVE BEEN REFERRED TO A SPECIALIST, IT MAY TAKE 1-2 WEEKS TO SCHEDULE/PROCESS THE REFERRAL. IF YOU HAVE NOT HEARD FROM US /SPECIALIST IN TWO WEEKS, PLEASE GIVE US  A CALL AT [phone] X [phone].

## 2024-07-07 ENCOUNTER — Encounter: Payer: Self-pay | Admitting: Physician Assistant

## 2024-07-07 ENCOUNTER — Ambulatory Visit (HOSPITAL_COMMUNITY): Admission: RE | Admit: 2024-07-07 | Source: Ambulatory Visit

## 2024-07-07 ENCOUNTER — Ambulatory Visit (INDEPENDENT_AMBULATORY_CARE_PROVIDER_SITE_OTHER): Admitting: Physician Assistant

## 2024-07-07 ENCOUNTER — Encounter (HOSPITAL_COMMUNITY): Payer: Self-pay

## 2024-07-07 VITALS — BP 148/83 | Resp 20 | Ht 74.0 in | Wt 271.0 lb

## 2024-07-07 DIAGNOSIS — F03A0 Unspecified dementia, mild, without behavioral disturbance, psychotic disturbance, mood disturbance, and anxiety: Secondary | ICD-10-CM

## 2024-07-07 NOTE — Progress Notes (Signed)
 Assessment/Plan:   Dementia of unclear etiology, without behavioral disturbance  Connor Burns. is a very pleasant 68 y.o. RH male with a history of hypertension, hyperlipidemia, PVD, Afib on Eliquis , s/p ICD, congestive cardiomyopathy, history of stroke 2006, arthritis, CKD3, gout, peripheral neuropathy,  prediabetes and a diagnosis of mild dementia of unclear etiology per neuropsych evaluation on Jan 2024 seen today in follow up for memory loss. Patient is currently on donepezil  10 mg daily. Slight cognitive decline is noted, today's MMSE is 20/30. In view of recent hospitalization it is felt prudent to wait a few weeks to observe any improvement before adding another medication I.e memantine as these medicines could have side effects such as dizziness, headaches, GI issue Discussed the plan with the patient and his wife, they agree.     Follow up in  6 months. Continue donepezil  10 mg daily, side effects discussed  Continue B12 supplements   Recommend good control of her cardiovascular risk factors Continue to control mood as per PCP     Subjective:    This patient is accompanied in the office by his wife who supplements the history.  Previous records as well as any outside records available were reviewed prior to todays visit. Patient was last seen on 07/28/2019 for with MMSE 25/30   Any changes in memory since last visit?  Some challenges-wife says.  He forgets conversations frequently.  Sometimes he gets frustrated with crossword puzzles.  He is not very active outside of the house. Tries to keep the brain open writing in the computer. Trying to start at the [facility].  repeats oneself?  Endorsed Disoriented when walking into a room? Denies    Leaving objects?  May misplace things but not in unusual places   Wandering behavior?  Denies.   Any personality changes since last visit?  Denies. He is on Risperdal to control the anxiety.  Any worsening depression?:   Endorsed by his wife.   Hallucinations or paranoia?  Denies.   Seizures? denies    Any sleep changes?  Does not sleep well. Denies vivid dreams, REM behavior or sleepwalking   Sleep apnea?   Denies.   Any hygiene concerns? He needs to be reminded  Independent of bathing and dressing?  Endorsed  Does the patient needs help with medications?  Wife is in charge   Who is in charge of the finances?  Wife is in charge     Any changes in appetite?  It is good He was hospitalized in [place]  due to acute hepatitis, as well as normocytic anemia issues no followed locally.        Patient have trouble swallowing? Denies.   Does the patient cook? No Any headaches?   denies   Any vision changes? Denies  Chronic back pain  denies   Ambulates with difficulty? Has not been very active, going back to [employer] soon.  Recent falls or head injuries? Denies.     Unilateral weakness, numbness or tingling? denies   Any tremors?  Denies    Any anosmia?  Denies   Any incontinence of urine?  Endorsed, wears Depends   Any bowel dysfunction?   Denies      Patient lives with his wife     Does the patient drive? No longer drives     Initial visit 02/26/22 The patient is seen in neurologic consultation at the request of [name], [name], MD for the evaluation of memory.  The patient is accompanied  by his daughter who supplements the history. This is a 68 y.o. year old RH  male who has had memory issues for about 2 years, initially noticed by his family.  He is good at it was not bad, but since January it got worse .  Since that time, he is repeating himself more, but attributes this as multitasking, I may become more distracted than I forget .  He denies being disoriented when walking into her room or leaving objects in unusual places.  He ambulates without difficulty, denies any falls or head injuries.  He works out at least 2 times a week.  He continues to drive, and denies getting lost.  He lives with his wife  and his youngest daughter.  His mood is good, he has moments in which she is more irritable my father is a very passionate person, and he gets angry if he gets bothered .  He is not as bad as he used to be .  He denies any history of depression or anxiety, hallucinations or paranoia.  He sleeps fairly well, denies vivid dreams or sleepwalking or REM behavior.  There are no hygiene concerns, he is independent of bathing and dressing.  Sometimes he forgets his medications.  There are no issues with his finances.  I am on top of it .  His appetite is not great according to him, he is trying to control his sugar intake to maintain a better weight.  He denies any trouble swallowing.  He does not cook.  He denies any headaches,double vision, dizziness, no new focal numbness or tingling, unilateral weakness, tremors or anosmia. No history of seizures. Denies urine incontinence, retention, constipation or diarrhea.  Denies OSA, ETOH or Tobacco. Family History remarkable for maternal grandmother and sister with some sort of dementia      Neuropsych evaluation 12/30/22  Briefly, results suggested ongoing impairment surrounding executive functioning, confrontation naming, visuospatial abilities (outside of clock drawing), and all aspects of verbal learning and memory. While receptive language also represented a normative impairment, he missed very few items across this task, suggesting that this might represent more of a normative impairment than a clinical impairment. An additional weakness was exhibited across attention/concentration while performance variability was exhibited across processing speed. The etiology of his presentation is unclear as current testing patterns are fairly nonspecific in nature. His most recent neuroimaging which I was able to review is in the form of a head CT dated to 2013. 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 Given that  no more recent scans could be located, I am unable to comment on any additional events over the past 10-11 years or the severity of underlying cerebrovascular disease which could be playing an active role in his current presentation. 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 Across these tasks, he did not benefit from repeated exposure to new information, was largely amnestic after brief delays, and performed poorly across yes/no recognition trials. This suggests concerns for rapid forgetting and a significant memory storage impairment. While this could raise concerns for the presence of a neurodegenerative illness such as Alzheimer's disease (especially given additional impairment surrounding confrontation naming), visual memory remained very strong. While this is both unexpected and encouraging, continued monitoring will be important to see if cognitive impairments remain stable or if progressive decline over the next few years occurs.     CT of the head report from outside facility 06/08/2024 (for acute  confusion) Small areas of encephalomalacia and gliosis in the left parietal and occipital lobes and right frontal and occipital lobes. Background sequelae of mild chronic microvascular ischemic change and mild global parenchymal volume loss. No acute transcortical infarction, intracranial hemorrhage, abnormal mass, or mass effect. No hydrocephalus or abnormal extra-axial fluid collections. Calcific atherosclerotic changes involving the intracranial vasculature.    PREVIOUS MEDICATIONS:   CURRENT MEDICATIONS:  Outpatient Encounter Medications as of 07/07/2024  Medication Sig   apixaban  (ELIQUIS ) 5 MG TABS tablet Take 1 tablet (5 mg total) by mouth 2 (two) times daily.   atorvastatin  (LIPITOR) 20 MG tablet Take 1 tablet (20 mg total) by mouth daily.   carvedilol  (COREG ) 3.125 MG tablet Take 1 tablet (3.125 mg total) by mouth 2 (two) times daily.   cyanocobalamin  (VITAMIN B12) 1000  MCG tablet Take 1 tablet (1,000 mcg total) by mouth daily.   donepezil  (ARICEPT ) 10 MG tablet Take 1 tablet (10 mg total) by mouth daily.   empagliflozin  (JARDIANCE ) 10 MG TABS tablet Take 1 tablet (10 mg total) by mouth daily.   isosorbide  mononitrate (IMDUR ) 30 MG 24 hr tablet Take 30 mg by mouth daily. Patient is taking 1/2 tablet daily per instructions on his prescription bottle   potassium chloride  SA (KLOR-CON  M) 20 MEQ tablet Take 60 mEq by mouth 2 (two) times daily.   risperiDONE (RISPERDAL) 0.5 MG tablet Take 0.5 mg by mouth daily.   silver  sulfADIAZINE  (SILVADENE ) 1 % cream Apply 1 Application topically 2 (two) times daily. Clean site of wound and apply cream.   SV IRON 325 (65 Fe) MG tablet Take 325 mg by mouth 3 (three) times a week.   torsemide  (DEMADEX ) 20 MG tablet Take 2 tablets (40 mg total) by mouth 2 (two) times daily.   No facility-administered encounter medications on file as of 07/07/2024.       07/07/2024    7:00 PM 07/02/2024    9:56 AM 07/28/2023    5:00 PM  MMSE - Mini Mental State Exam  Not completed:  Unable to complete   Orientation to time 1 3 3   Orientation to Place 3 5 5   Registration 3  3  Attention/ Calculation 5 5 4   Recall 0 0 1  Language- name 2 objects 2 2 2   Language- repeat 1  1  Language- follow 3 step command 3  3  Language- read & follow direction 1  1  Write a sentence 1  1  Copy design 0  1  Total score 20  25      03/18/2022    3:00 PM  Montreal Cognitive Assessment   Visuospatial/ Executive (0/5) 3  Naming (0/3) 3  Attention: Read list of digits (0/2) 2  Attention: Read list of letters (0/1) 1  Attention: Serial 7 subtraction starting at 100 (0/3) 3  Language: Repeat phrase (0/2) 1  Language : Fluency (0/1) 1  Abstraction (0/2) 1  Delayed Recall (0/5) 3  Orientation (0/6) 6  Total 24  Adjusted Score (based on education) 24    Objective:     PHYSICAL EXAMINATION:    VITALS:   Vitals:   07/07/24 1257  BP: (!) 148/83   Resp: 20  SpO2: 95%  Weight: 271 lb (122.9 kg)  Height: 6' 2 (1.88 m)    GEN:  The patient appears stated age and is in NAD. HEENT:  Normocephalic, atraumatic.   Neurological examination:  General: NAD, well-groomed, appears stated age. Orientation: The patient is alert.  Oriented to person, place and date Cranial nerves: There is good facial symmetry.The speech is fluent and clear. No aphasia or dysarthria. Fund of knowledge is appropriate. Recent and remote memory are impaired. Attention and concentration are normal. Able to name objects and repeat phrases.  Hearing is intact to conversational tone.  Sensation: Sensation is intact to light touch throughout Motor: Strength is at least antigravity x4. DTR's 2/4 in UE/LE     Movement examination: Tone: There is normal tone in the UE/LE Abnormal movements:  no tremor.  No myoclonus.  No asterixis.   Coordination:  There is no decremation with RAM's. Normal finger to nose  Gait and Station: The patient has some difficulty arising out of a deep-seated chair without the use of the hands. The patient's stride length is good.  Gait is cautious and narrow.    Thank you for allowing us  the opportunity to participate in the care of this nice patient. Please do not hesitate to contact us  for any questions or concerns.   Total time spent on today's visit was 35 minutes dedicated to this patient today, preparing to see patient, examining the patient, ordering tests and/or medications and counseling the patient, documenting clinical information in the EHR or other health record, independently interpreting results and communicating results to the patient/family, discussing treatment and goals, answering patient's questions and coordinating care.  Cc:  Georgina Speaks, FNP  Camie Sevin 07/07/2024 7:30 PM

## 2024-07-07 NOTE — Patient Instructions (Signed)
 It was a pleasure to see you today at our office.   Recommendations:    Donepezil  10 mg daily. Side effects discussed   Follow up in 6 month     RECOMMENDATIONS FOR ALL PATIENTS WITH MEMORY PROBLEMS: 1. Continue to exercise (Recommend 30 minutes of walking everyday, or 3 hours every week) 2. Increase social interactions - continue going to [place] and enjoy social gatherings with friends and family 3. Eat healthy, avoid fried foods and eat more fruits and vegetables 4. Maintain adequate blood pressure, blood sugar, and blood cholesterol level. Reducing the risk of stroke and cardiovascular disease also helps promoting better memory. 5. Avoid stressful situations. Live a simple life and avoid aggravations. Organize your time and prepare for the next day in anticipation. 6. Sleep well, avoid any interruptions of sleep and avoid any distractions in the bedroom that may interfere with adequate sleep quality 7. Avoid sugar, avoid sweets as there is a strong link between excessive sugar intake, diabetes, and cognitive impairment We discussed the Mediterranean diet, which has been shown to help patients reduce the risk of progressive memory disorders and reduces cardiovascular risk. This includes eating fish, eat fruits and green leafy vegetables, nuts like almonds and hazelnuts, walnuts, and also use olive oil. Avoid fast foods and fried foods as much as possible. Avoid sweets and sugar as sugar use has been linked to worsening of memory function.  There is always a concern of gradual progression of memory problems. If this is the case, then we may need to adjust level of care according to patient needs. Support, both to the patient and caregiver, should then be put into place.        FALL PRECAUTIONS: Be cautious when walking. Scan the area for obstacles that may increase the risk of trips and falls. When getting up in the mornings, sit up at the edge of the bed for a few minutes before getting out  of bed. Consider elevating the bed at the head end to avoid drop of blood pressure when getting up. Walk always in a well-lit room (use night lights in the walls). Avoid area rugs or power cords from appliances in the middle of the walkways. Use a walker or a cane if necessary and consider physical therapy for balance exercise. Get your eyesight checked regularly.  FINANCIAL OVERSIGHT: Supervision, especially oversight when making financial decisions or transactions is also recommended.  HOME SAFETY: Consider the safety of the kitchen when operating appliances like stoves, microwave oven, and blender. Consider having supervision and share cooking responsibilities until no longer able to participate in those. Accidents with firearms and other hazards in the house should be identified and addressed as well.   ABILITY TO BE LEFT ALONE: If patient is unable to contact 911 operator, consider using LifeLine, or when the need is there, arrange for someone to stay with patients. Smoking is a fire hazard, consider supervision or cessation. Risk of wandering should be assessed by caregiver and if detected at any point, supervision and safe proof recommendations should be instituted.  MEDICATION SUPERVISION: Inability to self-administer medication needs to be constantly addressed. Implement a mechanism to ensure safe administration of the medications.   DRIVING: Regarding driving, in patients with progressive memory problems, driving will be impaired. We advise to have someone else do the driving if trouble finding directions or if minor accidents are reported. Independent driving assessment is available to determine safety of driving.   If you are interested in the driving  assessment, you can contact the following:  [facility] [employer] [facility] in [place] [phone]  [facility] [phone]  [facility] [phone] or  [phone]    Mediterranean Diet A Mediterranean diet refers to food and lifestyle choices that are based on the traditions of countries located on the [employer]. This way of eating has been shown to help prevent certain conditions and improve outcomes for people who have chronic diseases, like kidney disease and heart disease. What are tips for following this plan? Lifestyle  Cook and eat meals together with your family, when possible. Drink enough fluid to keep your urine clear or pale yellow. Be physically active every day. This includes: Aerobic exercise like running or swimming. Leisure activities like gardening, walking, or housework. Get 7-8 hours of sleep each night. If recommended by your health care provider, drink red wine in moderation. This means 1 glass a day for nonpregnant women and 2 glasses a day for men. A glass of wine equals 5 oz (150 mL). Reading food labels  Check the serving size of packaged foods. For foods such as rice and pasta, the serving size refers to the amount of cooked product, not dry. Check the total fat in packaged foods. Avoid foods that have saturated fat or trans fats. Check the ingredients list for added sugars, such as corn syrup. Shopping  At the grocery store, buy most of your food from the areas near the walls of the store. This includes: Fresh fruits and vegetables (produce). Grains, beans, nuts, and seeds. Some of these may be available in unpackaged forms or large amounts (in bulk). Fresh seafood. Poultry and eggs. Low-fat dairy products. Buy whole ingredients instead of prepackaged foods. Buy fresh fruits and vegetables in-season from local farmers markets. Buy frozen fruits and vegetables in resealable bags. If you do not have access to quality fresh seafood, buy precooked frozen shrimp or canned fish, such as tuna, salmon, or sardines. Buy small amounts of raw or cooked vegetables, salads, or olives from the deli or salad bar  at your store. Stock your pantry so you always have certain foods on hand, such as olive oil, canned tuna, canned tomatoes, rice, pasta, and beans. Cooking  Cook foods with extra-virgin olive oil instead of using butter or other vegetable oils. Have meat as a side dish, and have vegetables or grains as your main dish. This means having meat in small portions or adding small amounts of meat to foods like pasta or stew. Use beans or vegetables instead of meat in common dishes like chili or lasagna. Experiment with different cooking methods. Try roasting or broiling vegetables instead of steaming or sauteing them. Add frozen vegetables to soups, stews, pasta, or rice. Add nuts or seeds for added healthy fat at each meal. You can add these to yogurt, salads, or vegetable dishes. Marinate fish or vegetables using olive oil, lemon juice, garlic, and fresh herbs. Meal planning  Plan to eat 1 vegetarian meal one day each week. Try to work up to 2 vegetarian meals, if possible. Eat seafood 2 or more times a week. Have healthy snacks readily available, such as: Vegetable sticks with hummus. Greek yogurt. Fruit and nut trail mix. Eat balanced meals throughout the week. This includes: Fruit: 2-3 servings a day Vegetables: 4-5 servings a day Low-fat dairy: 2 servings a day Fish, poultry, or lean meat: 1 serving a day Beans and legumes: 2 or more servings a week Nuts and seeds: 1-2  servings a day Whole grains: 6-8 servings a day Extra-virgin olive oil: 3-4 servings a day Limit red meat and sweets to only a few servings a month What are my food choices? Mediterranean diet Recommended Grains: Whole-grain pasta. Brown rice. Bulgar wheat. Polenta. Couscous. Whole-wheat bread. [place] Madeira. Vegetables: Artichokes. Beets. Broccoli. Cabbage. Carrots. Eggplant. Green beans. Chard. Kale. Spinach. Onions. Leeks. Peas. Squash. Tomatoes. Peppers. Radishes. Fruits: Apples. Apricots. Avocado. Berries.  Bananas. Cherries. Dates. Figs. Grapes. Lemons. Melon. Oranges. Peaches. Plums. Pomegranate. Meats and other protein foods: Beans. Almonds. Sunflower seeds. Pine nuts. Peanuts. Cod. Salmon. Scallops. Shrimp. Tuna. Tilapia. Clams. Oysters. Eggs. Dairy: Low-fat milk. Cheese. Greek yogurt. Beverages: Water. Red wine. Herbal tea. Fats and oils: Extra virgin olive oil. Avocado oil. Grape seed oil. Sweets and desserts: [place] yogurt with honey. Baked apples. Poached pears. Trail mix. Seasoning and other foods: Basil. Cilantro. Coriander. Cumin. Mint. Parsley. Sage. Rosemary. Tarragon. Garlic. Oregano. Thyme. Pepper. Balsalmic vinegar. Tahini. Hummus. Tomato sauce. Olives. Mushrooms. Limit these Grains: Prepackaged pasta or rice dishes. Prepackaged cereal with added sugar. Vegetables: Deep fried potatoes (french fries). Fruits: Fruit canned in syrup. Meats and other protein foods: Beef. Pork. Lamb. Poultry with skin. Hot dogs. [place]. Dairy: Ice cream. Sour cream. Whole milk. Beverages: Juice. Sugar-sweetened soft drinks. Beer. Liquor and spirits. Fats and oils: Butter. Canola oil. Vegetable oil. Beef fat (tallow). Lard. Sweets and desserts: Cookies. Cakes. Pies. Candy. Seasoning and other foods: Mayonnaise. Premade sauces and marinades. The items listed may not be a complete list. Talk with your dietitian about what dietary choices are right for you. Summary The Mediterranean diet includes both food and lifestyle choices. Eat a variety of fresh fruits and vegetables, beans, nuts, seeds, and whole grains. Limit the amount of red meat and sweets that you eat. Talk with your health care provider about whether it is safe for you to drink red wine in moderation. This means 1 glass a day for nonpregnant women and 2 glasses a day for men. A glass of wine equals 5 oz (150 mL). This information is not intended to replace advice given to you by your health care provider. Make sure you discuss any questions you  have with your health care provider. Document Released: 08/01/2016 Document Revised: 09/03/2016 Document Reviewed: 08/01/2016 Elsevier Interactive Patient Education  2017 [employer].

## 2024-07-08 ENCOUNTER — Encounter (HOSPITAL_BASED_OUTPATIENT_CLINIC_OR_DEPARTMENT_OTHER): Admitting: General Surgery

## 2024-07-08 DIAGNOSIS — L97422 Non-pressure chronic ulcer of left heel and midfoot with fat layer exposed: Secondary | ICD-10-CM | POA: Diagnosis not present

## 2024-07-08 DIAGNOSIS — L97819 Non-pressure chronic ulcer of other part of right lower leg with unspecified severity: Secondary | ICD-10-CM | POA: Diagnosis not present

## 2024-07-08 DIAGNOSIS — L97822 Non-pressure chronic ulcer of other part of left lower leg with fat layer exposed: Secondary | ICD-10-CM | POA: Diagnosis not present

## 2024-07-08 DIAGNOSIS — L97812 Non-pressure chronic ulcer of other part of right lower leg with fat layer exposed: Secondary | ICD-10-CM | POA: Diagnosis not present

## 2024-07-08 DIAGNOSIS — S91114A Laceration without foreign body of right lesser toe(s) without damage to nail, initial encounter: Secondary | ICD-10-CM | POA: Diagnosis not present

## 2024-07-08 DIAGNOSIS — I872 Venous insufficiency (chronic) (peripheral): Secondary | ICD-10-CM | POA: Diagnosis not present

## 2024-07-10 DIAGNOSIS — R0602 Shortness of breath: Secondary | ICD-10-CM | POA: Diagnosis not present

## 2024-07-12 ENCOUNTER — Encounter (HOSPITAL_COMMUNITY): Payer: Self-pay

## 2024-07-12 DIAGNOSIS — I130 Hypertensive heart and chronic kidney disease with heart failure and stage 1 through stage 4 chronic kidney disease, or unspecified chronic kidney disease: Secondary | ICD-10-CM | POA: Insufficient documentation

## 2024-07-12 DIAGNOSIS — Z950 Presence of cardiac pacemaker: Secondary | ICD-10-CM | POA: Insufficient documentation

## 2024-07-12 DIAGNOSIS — D6869 Other thrombophilia: Secondary | ICD-10-CM | POA: Insufficient documentation

## 2024-07-12 NOTE — Assessment & Plan Note (Signed)
 Progressive, went missing in [place] .  Had CT head in [place]  no abnormalities found.  He was on Aricept  10 mg every night and risperidone  0.5 mg twice daily added to his treatment regimen.

## 2024-07-12 NOTE — Assessment & Plan Note (Signed)
On eliquis 5 mg BID d/t PAF.

## 2024-07-12 NOTE — Assessment & Plan Note (Addendum)
-   limited Echo: EF 41-45% improved from prior  - mild valvular disease  - cont home meds .  He was discharged with cardiac diet 2000 mL fluid per  Discharge notes.

## 2024-07-13 ENCOUNTER — Ambulatory Visit: Admitting: Podiatry

## 2024-07-14 ENCOUNTER — Other Ambulatory Visit: Payer: Self-pay

## 2024-07-14 MED ORDER — RISPERIDONE 0.5 MG PO TABS: 0.5000 mg | ORAL_TABLET | Freq: Every day | ORAL | 1 refills | Status: DC

## 2024-07-15 ENCOUNTER — Ambulatory Visit (HOSPITAL_BASED_OUTPATIENT_CLINIC_OR_DEPARTMENT_OTHER): Admitting: Internal Medicine

## 2024-07-16 ENCOUNTER — Ambulatory Visit (HOSPITAL_BASED_OUTPATIENT_CLINIC_OR_DEPARTMENT_OTHER): Admitting: General Surgery

## 2024-07-19 ENCOUNTER — Encounter (HOSPITAL_BASED_OUTPATIENT_CLINIC_OR_DEPARTMENT_OTHER): Admitting: General Surgery

## 2024-07-19 DIAGNOSIS — L97822 Non-pressure chronic ulcer of other part of left lower leg with fat layer exposed: Secondary | ICD-10-CM | POA: Diagnosis not present

## 2024-07-19 DIAGNOSIS — L97812 Non-pressure chronic ulcer of other part of right lower leg with fat layer exposed: Secondary | ICD-10-CM | POA: Diagnosis not present

## 2024-07-19 DIAGNOSIS — L97829 Non-pressure chronic ulcer of other part of left lower leg with unspecified severity: Secondary | ICD-10-CM | POA: Diagnosis not present

## 2024-07-19 DIAGNOSIS — L97819 Non-pressure chronic ulcer of other part of right lower leg with unspecified severity: Secondary | ICD-10-CM | POA: Diagnosis not present

## 2024-07-19 DIAGNOSIS — L97422 Non-pressure chronic ulcer of left heel and midfoot with fat layer exposed: Secondary | ICD-10-CM | POA: Diagnosis not present

## 2024-07-20 ENCOUNTER — Ambulatory Visit (HOSPITAL_COMMUNITY)
Admission: RE | Admit: 2024-07-20 | Discharge: 2024-07-20 | Disposition: A | Discharge status: Home / Self Care | Source: Ambulatory Visit | Attending: General Surgery | Admitting: General Surgery

## 2024-07-20 ENCOUNTER — Other Ambulatory Visit (HOSPITAL_COMMUNITY): Payer: Self-pay | Admitting: General Surgery

## 2024-07-20 ENCOUNTER — Ambulatory Visit

## 2024-07-20 DIAGNOSIS — L97529 Non-pressure chronic ulcer of other part of left foot with unspecified severity: Secondary | ICD-10-CM | POA: Insufficient documentation

## 2024-07-20 NOTE — Progress Notes (Deleted)
 Patient attempted FVC once but it was not successful attempt. Patient then stated he did not want to participate in the pft today and wanted to come back another day. Tried talking to patient about completing at least the first part of test, but patient refused and stated he would come back another day.

## 2024-07-21 ENCOUNTER — Ambulatory Visit (INDEPENDENT_AMBULATORY_CARE_PROVIDER_SITE_OTHER): Admitting: Internal Medicine

## 2024-07-21 ENCOUNTER — Other Ambulatory Visit: Payer: Self-pay

## 2024-07-21 DIAGNOSIS — R0602 Shortness of breath: Secondary | ICD-10-CM

## 2024-07-21 LAB — PULMONARY FUNCTION TEST
DL/VA % pred: 88 %
DL/VA: 3.55 ml/min/mmHg/L
DLCO cor % pred: 50 %
DLCO cor: 14.83 ml/min/mmHg
DLCO unc % pred: 46 %
DLCO unc: 13.77 ml/min/mmHg
FEF 25-75 Post: 0.8 L/s
FEF 25-75 Pre: 0.78 L/s
FEF2575-%Change-Post: 2 %
FEF2575-%Pred-Post: 27 %
FEF2575-%Pred-Pre: 26 %
FEV1-%Change-Post: 1 %
FEV1-%Pred-Post: 36 %
FEV1-%Pred-Pre: 35 %
FEV1-Post: 1.4 L
FEV1-Pre: 1.38 L
FEV1FVC-%Change-Post: 2 %
FEV1FVC-%Pred-Pre: 92 %
FEV6-%Change-Post: 0 %
FEV6-%Pred-Post: 40 %
FEV6-%Pred-Pre: 40 %
FEV6-Post: 1.99 L
FEV6-Pre: 1.99 L
FEV6FVC-%Change-Post: 0 %
FEV6FVC-%Pred-Post: 105 %
FEV6FVC-%Pred-Pre: 104 %
FVC-%Change-Post: 0 %
FVC-%Pred-Post: 38 %
FVC-%Pred-Pre: 38 %
FVC-Post: 1.99 L
FVC-Pre: 2.01 L
Post FEV1/FVC ratio: 70 %
Post FEV6/FVC ratio: 100 %
Pre FEV1/FVC ratio: 69 %
Pre FEV6/FVC Ratio: 99 %

## 2024-07-21 NOTE — Progress Notes (Signed)
 Full pft attempted. Lung volumes not performed.

## 2024-07-21 NOTE — Patient Instructions (Signed)
 Full pft attempted. Lung volumes not performed.

## 2024-07-22 ENCOUNTER — Other Ambulatory Visit (HOSPITAL_COMMUNITY): Payer: Self-pay | Admitting: General Surgery

## 2024-07-22 ENCOUNTER — Ambulatory Visit (HOSPITAL_BASED_OUTPATIENT_CLINIC_OR_DEPARTMENT_OTHER)
Admission: RE | Admit: 2024-07-22 | Discharge: 2024-07-22 | Disposition: A | Discharge status: Home / Self Care | Source: Ambulatory Visit | Attending: Family Medicine | Admitting: Family Medicine

## 2024-07-22 ENCOUNTER — Ambulatory Visit (HOSPITAL_COMMUNITY)
Admission: RE | Admit: 2024-07-22 | Discharge: 2024-07-22 | Disposition: A | Discharge status: Home / Self Care | Source: Ambulatory Visit | Attending: Family Medicine | Admitting: Family Medicine

## 2024-07-22 DIAGNOSIS — S81801A Unspecified open wound, right lower leg, initial encounter: Secondary | ICD-10-CM

## 2024-07-22 DIAGNOSIS — S91302A Unspecified open wound, left foot, initial encounter: Secondary | ICD-10-CM | POA: Insufficient documentation

## 2024-07-22 DIAGNOSIS — S81801D Unspecified open wound, right lower leg, subsequent encounter: Secondary | ICD-10-CM | POA: Diagnosis present

## 2024-07-26 ENCOUNTER — Encounter (HOSPITAL_BASED_OUTPATIENT_CLINIC_OR_DEPARTMENT_OTHER): Attending: General Surgery | Admitting: General Surgery

## 2024-07-26 DIAGNOSIS — L97429 Non-pressure chronic ulcer of left heel and midfoot with unspecified severity: Secondary | ICD-10-CM | POA: Diagnosis not present

## 2024-07-26 DIAGNOSIS — F03A0 Unspecified dementia, mild, without behavioral disturbance, psychotic disturbance, mood disturbance, and anxiety: Secondary | ICD-10-CM | POA: Diagnosis not present

## 2024-07-26 DIAGNOSIS — L97829 Non-pressure chronic ulcer of other part of left lower leg with unspecified severity: Secondary | ICD-10-CM | POA: Diagnosis not present

## 2024-07-26 DIAGNOSIS — I739 Peripheral vascular disease, unspecified: Secondary | ICD-10-CM | POA: Diagnosis not present

## 2024-07-26 DIAGNOSIS — I872 Venous insufficiency (chronic) (peripheral): Secondary | ICD-10-CM | POA: Diagnosis not present

## 2024-07-26 DIAGNOSIS — R600 Localized edema: Secondary | ICD-10-CM | POA: Insufficient documentation

## 2024-07-26 DIAGNOSIS — L97422 Non-pressure chronic ulcer of left heel and midfoot with fat layer exposed: Secondary | ICD-10-CM | POA: Diagnosis not present

## 2024-07-26 DIAGNOSIS — L97822 Non-pressure chronic ulcer of other part of left lower leg with fat layer exposed: Secondary | ICD-10-CM | POA: Diagnosis not present

## 2024-07-26 DIAGNOSIS — L97819 Non-pressure chronic ulcer of other part of right lower leg with unspecified severity: Secondary | ICD-10-CM | POA: Insufficient documentation

## 2024-07-26 DIAGNOSIS — N1832 Chronic kidney disease, stage 3b: Secondary | ICD-10-CM | POA: Insufficient documentation

## 2024-07-26 DIAGNOSIS — L97812 Non-pressure chronic ulcer of other part of right lower leg with fat layer exposed: Secondary | ICD-10-CM | POA: Diagnosis not present

## 2024-07-26 DIAGNOSIS — I5022 Chronic systolic (congestive) heart failure: Secondary | ICD-10-CM | POA: Diagnosis not present

## 2024-07-26 LAB — VAS US ABI WITH/WO TBI
Left ABI: 0.66
Right ABI: 0.66

## 2024-07-26 NOTE — Telephone Encounter (Signed)
 Call x1; lvmtcb to schedule pt w/ EP APP to discuss ICD therapy and atrial fibrillation per Camnitz.

## 2024-07-27 ENCOUNTER — Telehealth (HOSPITAL_COMMUNITY): Payer: Self-pay

## 2024-07-27 NOTE — Telephone Encounter (Signed)
 Called to confirm/remind patient of [facility].   Appointment:   [] Confirmed  [x] Left mess   [] No answer/No voice mail  [] VM Full/unable to leave message  [] Phone not in service  And to bring in all medications and/or complete list.

## 2024-07-28 ENCOUNTER — Ambulatory Visit (HOSPITAL_COMMUNITY)
Admission: RE | Admit: 2024-07-28 | Discharge: 2024-07-28 | Disposition: A | Discharge status: Home / Self Care | Source: Ambulatory Visit | Attending: Family Medicine | Admitting: Family Medicine

## 2024-07-28 ENCOUNTER — Ambulatory Visit (INDEPENDENT_AMBULATORY_CARE_PROVIDER_SITE_OTHER)

## 2024-07-28 ENCOUNTER — Encounter (HOSPITAL_COMMUNITY): Payer: Self-pay

## 2024-07-28 VITALS — BP 132/80 | HR 89 | Ht 74.0 in | Wt 298.2 lb

## 2024-07-28 DIAGNOSIS — Z79899 Other long term (current) drug therapy: Secondary | ICD-10-CM | POA: Diagnosis not present

## 2024-07-28 DIAGNOSIS — Z86718 Personal history of other venous thrombosis and embolism: Secondary | ICD-10-CM | POA: Insufficient documentation

## 2024-07-28 DIAGNOSIS — Z951 Presence of aortocoronary bypass graft: Secondary | ICD-10-CM | POA: Insufficient documentation

## 2024-07-28 DIAGNOSIS — I130 Hypertensive heart and chronic kidney disease with heart failure and stage 1 through stage 4 chronic kidney disease, or unspecified chronic kidney disease: Secondary | ICD-10-CM | POA: Insufficient documentation

## 2024-07-28 DIAGNOSIS — I739 Peripheral vascular disease, unspecified: Secondary | ICD-10-CM | POA: Insufficient documentation

## 2024-07-28 DIAGNOSIS — I252 Old myocardial infarction: Secondary | ICD-10-CM | POA: Diagnosis not present

## 2024-07-28 DIAGNOSIS — Z7901 Long term (current) use of anticoagulants: Secondary | ICD-10-CM | POA: Diagnosis not present

## 2024-07-28 DIAGNOSIS — I4811 Longstanding persistent atrial fibrillation: Secondary | ICD-10-CM | POA: Insufficient documentation

## 2024-07-28 DIAGNOSIS — F03A0 Unspecified dementia, mild, without behavioral disturbance, psychotic disturbance, mood disturbance, and anxiety: Secondary | ICD-10-CM | POA: Diagnosis not present

## 2024-07-28 DIAGNOSIS — N184 Chronic kidney disease, stage 4 (severe): Secondary | ICD-10-CM

## 2024-07-28 DIAGNOSIS — Z95810 Presence of automatic (implantable) cardiac defibrillator: Secondary | ICD-10-CM | POA: Insufficient documentation

## 2024-07-28 DIAGNOSIS — I472 Ventricular tachycardia, unspecified: Secondary | ICD-10-CM | POA: Diagnosis not present

## 2024-07-28 DIAGNOSIS — I2510 Atherosclerotic heart disease of native coronary artery without angina pectoris: Secondary | ICD-10-CM | POA: Diagnosis not present

## 2024-07-28 DIAGNOSIS — I5022 Chronic systolic (congestive) heart failure: Secondary | ICD-10-CM | POA: Diagnosis not present

## 2024-07-28 DIAGNOSIS — I25810 Atherosclerosis of coronary artery bypass graft(s) without angina pectoris: Secondary | ICD-10-CM | POA: Diagnosis not present

## 2024-07-28 DIAGNOSIS — E669 Obesity, unspecified: Secondary | ICD-10-CM | POA: Diagnosis not present

## 2024-07-28 DIAGNOSIS — I255 Ischemic cardiomyopathy: Secondary | ICD-10-CM | POA: Diagnosis not present

## 2024-07-28 DIAGNOSIS — F0390 Unspecified dementia without behavioral disturbance: Secondary | ICD-10-CM | POA: Diagnosis not present

## 2024-07-28 DIAGNOSIS — I4819 Other persistent atrial fibrillation: Secondary | ICD-10-CM | POA: Diagnosis not present

## 2024-07-28 DIAGNOSIS — Z8249 Family history of ischemic heart disease and other diseases of the circulatory system: Secondary | ICD-10-CM | POA: Insufficient documentation

## 2024-07-28 LAB — BASIC METABOLIC PANEL WITH GFR
Anion gap: 12 (ref 5–15)
BUN: 29 mg/dL — ABNORMAL HIGH (ref 8–23)
CO2: 26 mmol/L (ref 22–32)
Calcium: 9.1 mg/dL (ref 8.9–10.3)
Chloride: 101 mmol/L (ref 98–111)
Creatinine, Ser: 2.17 mg/dL — ABNORMAL HIGH (ref 0.61–1.24)
GFR, Estimated: 32 mL/min — ABNORMAL LOW (ref >60)
Glucose, Bld: 99 mg/dL (ref 70–99)
Potassium: 3.4 mmol/L — ABNORMAL LOW (ref 3.5–5.1)
Sodium: 139 mmol/L (ref 135–145)

## 2024-07-28 LAB — BRAIN NATRIURETIC PEPTIDE: B Natriuretic Peptide: 767.8 pg/mL — ABNORMAL HIGH (ref 0.0–100.0)

## 2024-07-28 MED ORDER — TORSEMIDE 20 MG PO TABS: 60.0000 mg | ORAL_TABLET | Freq: Two times a day (BID) | ORAL | 5 refills | Status: DC

## 2024-07-28 MED ORDER — TORSEMIDE 20 MG PO TABS: 80.0000 mg | ORAL_TABLET | Freq: Two times a day (BID) | ORAL | 5 refills | Status: AC

## 2024-07-28 MED ORDER — POTASSIUM CHLORIDE CRYS ER 20 MEQ PO TBCR: 60.0000 meq | EXTENDED_RELEASE_TABLET | Freq: Two times a day (BID) | ORAL | 3 refills | Status: DC

## 2024-07-28 NOTE — Progress Notes (Addendum)
 Advanced Heart Failure Clinic Note   Primary Care: Georgina Speaks, FNP Primary Cardiologist: Dr. Sheena Nephrology: Dr. Tobie ([facility])  HPI: 68 y.o. male with history of CAD s/p MI/CABG in 2004 in [place], HFrEF/ICM s/p ICD, longstanding persistent atrial fibrillation, hx VT, CKD, hx DVT, obesity, dementia, PAD. He previously slaw Dr. Okey in 2018, then followed with Cardiology in [place], [place]. Most recently established with Dr. Sheena in 03/24 after moving back to the area.   Had cath in 2015. Report not available. Available records suggest 2 of his vein grafts were occluded.  Echo 2019: EF 30-35%  Echo at OSH 03/24: EF 30-35%, RV function not assessed  He had a stress test in 3/24 with large area of infarction in inferior wall without reversible ischemia, LVEF 23%. Study was reviewed by Dr. Sheena. Decision made to defer LHC d/t kidney function.   He was admitted 9/24 with acute respiratory failure with hypoxia 2/2 acute on chronic CHF. Had not been compliant with medications. Echo during admit with EF 35-40%, RWMA, RV mildly reduced. Cardiology consulted. He was diuresed. GDMT limited by CKD, blood pressure too soft for BiDil. Weight on admit 294 lb >>> 225 lb at discharge.   He was seen in TOC x 2 post hospital and was instructed to continue ongoing care w/ general cardiology w/ f/u in [facility] as needed.   Follow up 2/25 with Gen Cards and was significantly volume overloaded, wt was up ~50 lb, in the setting of dietary indiscretion and nonadherance to home diuretic regimen. Furoscix  was considered but pt declined citing fear of needles. Torsemide  was increased and he was instructed to follow back up in the [facility].   Admitted 6/25 to OOH in [place] , with AMS. Got in his car and drove from [place] to [place]. Found by police at a gas station, and taken to hospital in [place]. GDMT stopped, given IVF. Found to have elevated LFTs, felt to be hepatic congestion, unable to tolerate MRCP. Ltd echo showed EF 41-45%.  AMS felt to be 2/2 to progressive dementia and he was started on risperidone . Cards consulted due to HF and diuresed with IV lasix , and GDMT restarted.  Today he returns for HF follow up with his wife, who assists with history due to patient's dementia. Overall feeling fair.He is feeling more SOB, wife says he is wheezing. Denies palpitations, abnormal bleeding, CP, dizziness, edema, or PND/Orthopnea. Appetite ok. Not weighing at home.Taking all medications with wife's assistance. Patient using [facility] and he is going to adult day center during the day. Wife in contact with PCP to get home care arranged.    Past Medical History:  Diagnosis Date   Acute osteomyelitis of ankle and foot 05/17/2014   Acute systolic heart failure (HCC) 09/12/2023   Arthritis 10/27/2020   Wrist   Atherosclerosis of native arteries of the extremities with ulceration 06/15/2014   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   veral times a day for 10 minutes each time; The patient was also instructed to hold his Xarelto  tonight and tomorrow night.  He is to restart his Xarelto  on Sunday night.   CAD (coronary artery disease)    CHF (congestive heart failure) 01/14/2017   Intermittent CHF with lower extremity edema.  I have given him Lasix  to take on a p.r.n. basis.Formatting of this note might be different from the original. Last Assessment & Plan: Formatting of this note might be different from the original. Intermittent CHF with lower extremity edema.  I  have given him Las   Chronic kidney disease 10/27/2020   Stage II   Congestive cardiomyopathy 01/14/2017   Cardiomyopathy with LV dysfunction.Last ejection fraction 40-45% by echocardiography in 2012.Patient currently not taking beta-blocker Ace inhibitor for some reason.We will repeat echocardiography to evaluate LV function then determine the need for afterload reduction and beta-blocker therapy.[place]   Coronary atherosclerosis 01/14/2017    Status remote CABG in 2004 in [place] .Left internal mammary artery to the obtuse marginal.Free Right internal mammary artery to the LAD.Saphenous vein graft to OM 2.Saphenous vein graft to the RCA.  Asymptomatic without chest pain or shortness of breath.Last nuclear stress test May 2018:  Large   DVT (deep venous thrombosis)    Dyslipidemia 05/07/2018   Hematuria 08/10/2015   Hypertension    ICD (implantable cardioverter-defibrillator) battery depletion 06/16/2018   Status post dual-chamber ICD generator change 06/16/2018.Last Assessment & Plan: Formatting of this note might be different from the original.Left infraclavicular incision intact with Dermabond.  There is no pocket hematoma present.  No drainage noted.Device interrogation this a.m. reveals appropriate device function.   Ischemic cardiomyopathy 06/18/2016   Echo 09/14/2011 showing LA 4.9cm, EF 40-45%.  Mild to moderate decreased LV systolic function.  Mild concentric LVH.  LA is moderately dilated.  Mild aortic cusp sclerosis. Aortic valve appears to be bicuspid.B. Concerning for potential dysrhythmia (ventricular tachycardia) especially given documented nonsustained ventricular tachy   Mild dementia, unclear etiology 12/30/2022   Myocardial infarction    Obesity (BMI 30-39.9) 08/10/2015   Onychomycosis 05/10/2014   Painful legs and moving toes of left foot 05/10/2014   Paroxysmal atrial fibrillation 08/08/2015   A. Paroxysmal.B. On Sotalol therapy.Last Assessment & Plan: History of present atrial fibrillation.  Patient will 80 mg twice daily.Also on rivaroxaban  20 mg a day.Patient again refused an ECG.I explained to the patient, that the absence of an EKG, I canno   Peripheral vascular disease    Shortness of breath 10/27/2020   Exertional   Stroke 10/27/2020   Right frontal infarct per neuroimaging dating back at least to 2013   Syncope 01/14/2017   In the setting of ischemic cardiomyopathy. B. Concerning for potential dysrhythmia  (ventricular tachycardia) especially given documented nonsustained ventricular tachycardia - EPS 9/25/201 showing reproducibly inducible nonsustained monomorphic VT up to 10 beat.  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 In the setting of ischemic cardiomyopathy. B. Concerning for potential dysrhythmia (ventricular tachycardia) especially given documented nonsustained ventricular tachycardia - EPS 9/25/201 showing reproducibly inducible nonsustained monomorphic VT up to 10 beat.  Reproducibly inducible nonsustained polymorphic VT.  Inducible VF wi    Current Outpatient Medications  Medication Sig Dispense Refill   apixaban  (ELIQUIS ) 5 MG TABS tablet Take 1 tablet (5 mg total) by mouth 2 (two) times daily. 60 tablet 3   atorvastatin  (LIPITOR) 20 MG tablet Take 1 tablet (20 mg total) by mouth daily. 30 tablet 2   carvedilol  (COREG ) 3.125 MG tablet Take 1 tablet (3.125 mg total) by mouth 2 (two) times daily. 60 tablet 2   cyanocobalamin  (VITAMIN B12) 1000 MCG tablet Take 1 tablet (1,000 mcg total) by mouth daily. 30 tablet 2   donepezil  (ARICEPT ) 10 MG tablet Take 1 tablet (10 mg total) by mouth daily. 90 tablet 3   empagliflozin  (JARDIANCE ) 10 MG TABS tablet Take 1 tablet (10 mg total) by mouth daily. 90 tablet 1  potassium chloride  SA (KLOR-CON  M) 20 MEQ tablet Take 60 mEq by mouth 2 (two) times daily. (Patient taking differently: Take 40 mEq by mouth 2 (two) times daily.)     risperiDONE  (RISPERDAL ) 0.5 MG tablet Take 1 tablet (0.5 mg total) by mouth daily. 30 tablet 1   silver  sulfADIAZINE  (SILVADENE ) 1 % cream Apply 1 Application topically 2 (two) times daily. Clean site of wound and apply cream. 400 g 1   torsemide  (DEMADEX ) 20 MG tablet Take 2 tablets (40 mg total) by mouth 2 (two) times daily. 180 tablet 2   isosorbide  mononitrate (IMDUR ) 30 MG 24 hr  tablet Take 30 mg by mouth daily. Patient is taking 1/2 tablet daily per instructions on his prescription bottle (Patient not taking: Reported on 07/28/2024)     SV IRON 325 (65 Fe) MG tablet Take 325 mg by mouth 3 (three) times a week. (Patient not taking: Reported on 07/28/2024)     No current facility-administered medications for this encounter.   Allergies  Allergen Reactions   Clopidogrel Anaphylaxis and Other (See Comments)    Wierd, like out of body thing Out of body experience    Plavix [Clopidogrel Bisulfate]    Social History   Socioeconomic History   Marital status: Married    Spouse name: [name]   Number of children: 1   Years of education: 66   Highest education level: Master's degree (e.g., MA, MS, MEng, MEd, MSW, MBA)  Occupational History   Occupation: Chaplain    Comment: [employer]    Comment: newly retired  Tobacco Use   Smoking status: Never   Smokeless tobacco: Never  Vaping Use   Vaping status: Never Used  Substance and Sexual Activity   Alcohol use: No   Drug use: No   Sexual activity: Not on file  Other Topics Concern   Not on file  Social History Narrative   Lives with wife in a 2 story home.  Has 3 children.     Works as a Metallurgist.     Education: Masters in [employer].   Right handed   Caffeine yes   Social Drivers of Health   Financial Resource Strain: Medium Risk (02/19/2024)   Overall Financial Resource Strain (CARDIA)    Difficulty of Paying Living Expenses: Somewhat hard  Food Insecurity: No Food Insecurity (06/11/2024)   Received from [facility]   Hunger Vital Sign    Within the past 12 months, you worried that your food would run out before you got the money to buy more.: Never true    Within the past 12 months, the food you bought just didn't last and you didn't have money to get more.: Never true  Transportation Needs: No Transportation Needs (06/11/2024)   Received from [facility]   PRAPARE -  Transportation    Lack of Transportation (Medical): No    Lack of Transportation (Non-Medical): No  Physical Activity: Not on file  Stress: Not on file  Social Connections: Not on file  Intimate Partner Violence: Patient Unable To Answer (02/25/2024)   Humiliation, Afraid, Rape, and Kick questionnaire    Fear of Current or Ex-Partner: Patient unable to answer    Emotionally Abused: Patient unable to answer    Physically Abused: Patient unable to answer    Sexually Abused: Patient unable to answer    Family History  Problem Relation Age of Onset   Heart disease Mother        before  age 75   Hyperlipidemia Mother    Hypertension Mother    Heart attack Mother    Hyperlipidemia Father    Hypertension Father    Memory loss Brother    Memory loss Brother    Memory loss Half-Sister    Memory loss Maternal Aunt    Wt Readings from Last 3 Encounters:  07/28/24 135.3 kg (298 lb 3.2 oz)  07/07/24 122.9 kg (271 lb)  07/02/24 120.7 kg (266 lb)   BP 132/80   Pulse 89   Ht 6' 2 (1.88 m)   Wt 135.3 kg (298 lb 3.2 oz)   SpO2 98%   BMI 38.29 kg/m   PHYSICAL EXAM: General:  NAD. No resp difficulty, walked into clinic with cane, chronically-ill appearing HEENT: Normal Neck: Supple. JVP 10 Cor: Irregular rate & rhythm. No rubs, gallops or murmurs. Lungs: Clear Abdomen: Soft, nontender, nondistended.  Extremities: No cyanosis, clubbing, rash, 1+ BLE edema leg wraps in place Neuro: Alert & oriented x 3, moves all 4 extremities w/o difficulty. Affect pleasant.  Device interrogation (personally reviewed): OptiVol up, 100% AF, no VT, 0.8 hr/day activity, VP 3.6%  ASSESSMENT & PLAN: Chronic Systolic Heart Failure - iCM - Hx MI s/p CABG in 2004 - s/p ICD - EF has been in the 30-35% x years - Echo 9/24 : EF 35-40%, RV mildly reduced - Echo 6/25: EF 40-45% - NYHA III, volume up on exam and device interrogation, weight up. - Increase torsemide  to 80 mg bid, increase KCL to 60 daily.  -  Continue Coreg  3.125 mg bid. - Continue Jardiance  10 mg daily. No GU symptoms. - Labs today, repeat BMET in 10-14 days.   2. CAD - Hx MI and CABG (LIMA to OM, Free RIMA to LAD, SVG to OM2, SVG to RCA) in 2003. - Records report 2 vein grafts occluded on cath in 2015. - No chest pain. - No ASA with anticoagulation. - Continue statin.  3. Persistent atrial fibrillation - hx Afib ablation - Pursuing rate control - Continue Coreg . - Continue Eliquis  5 mg bid. - Followed by EP.  4. CKD IV - Baseline SCr 2.3-2.5. - Continue Jardiance . - Labs today  5. Dementia - Follows with Neurology.   - Wife is assisting with medications  6. VT - Hx ICD placement - Established with EP  7. PAD - Previously followed by Dr Eliza with [facility], has hx of osteomyelitis - Denies claudication but generally not active - Chronic LE wound, followed by Wound Clinic - ABIs showed moderate PVD in bilateral legs - Referred to Dr. Darron, missed appt due to hospitalization - Will reach out to scheduler to help arrange follow up - Continue statin + AC  Keep follow up in 1 month with APP. Assign to Dr. Gardenia.  Harlene CHRISTELLA Gainer, FNP-BC 07/28/24

## 2024-07-28 NOTE — Patient Instructions (Addendum)
 Medication Changes:  INCREASE TORSEMIDE  TO 80MG  TWICE DAILY   INCREASE POTASSIUM TO TWICE DAILY   Lab Work:  Labs done today, your results will be available in MyChart, we will contact you for abnormal readings.  Then return for labs as scheduled in 10-14 DAYS  Referrals:  MESSAGE SENT TO DR. RENATO OFFICE TO SCHEDULE FOLLOW UP---THE NUMBER TO HIS OFFICE [phone]  Follow-Up in: AS SCHEDULED IN 1 MONTH   At the Advanced [facility] Failure Clinic, you and your health needs are our priority. We have a designated team specialized in the treatment of Heart Failure. This Care Team includes your primary Heart Failure Specialized Cardiologist (physician), Advanced Practice Providers (APPs- Physician Assistants and Nurse Practitioners), and Pharmacist who all work together to provide you with the care you need, when you need it.   You may see any of the following providers on your designated Care Team at your next follow up:  Dr. Toribio Fuel Dr. Ezra Shuck Dr. Ria Commander Dr. Odis Brownie Greig Mosses, NP Caffie Shed, [place] [facility], [place] Beckey Coe, NP [place] Lee, NP Tinnie Redman, PharmD   Please be sure to bring in all your medications bottles to every appointment.   Need to Contact Us :  If you have any questions or concerns before your next appointment please send us  a message through [place] or call our office at [phone].    TO LEAVE A MESSAGE FOR THE NURSE SELECT OPTION 2, PLEASE LEAVE A MESSAGE INCLUDING: YOUR NAME DATE OF BIRTH CALL BACK NUMBER REASON FOR CALL**this is important as we prioritize the call backs  YOU WILL RECEIVE A CALL BACK THE SAME DAY AS LONG AS YOU CALL BEFORE 4:00 PM

## 2024-07-28 NOTE — Telephone Encounter (Signed)
 Call x2; [email or web address] to schedule pt w/ EP APP to discuss ICD therapy and atrial fibrillation per [place].

## 2024-07-29 ENCOUNTER — Telehealth: Payer: Self-pay

## 2024-07-29 NOTE — Telephone Encounter (Signed)
 Copied from CRM #[id number]. Topic: Referral - Status >> Jul 29, 2024  2:09 PM Gustabo D wrote: GI referral what is the status- please call back [phone]   LVM with info to GI doctor-  [facility]  [address]  [address]  [place], [place] [address]  [phone]  Marvis [name] Aquas, MD  [facility] [address]  [place], [place] [address]  [phone] (Work)  [phone] (Fax)

## 2024-07-30 ENCOUNTER — Ambulatory Visit (HOSPITAL_COMMUNITY): Payer: Self-pay | Admitting: Family Medicine

## 2024-07-30 ENCOUNTER — Encounter: Payer: Self-pay | Admitting: Surgery

## 2024-07-30 ENCOUNTER — Ambulatory Visit: Attending: Surgery | Admitting: Surgery

## 2024-07-30 VITALS — BP 130/83 | HR 83 | Temp 97.9°F | Ht 74.0 in | Wt 286.0 lb

## 2024-07-30 DIAGNOSIS — I7025 Atherosclerosis of native arteries of other extremities with ulceration: Secondary | ICD-10-CM

## 2024-07-30 LAB — CUP PACEART REMOTE DEVICE CHECK
Battery Remaining Longevity: 22 mo
Battery Voltage: 2.93 V
Brady Statistic RA Percent Paced: 0.91 %
Brady Statistic RV Percent Paced: 3.63 %
Date Time Interrogation Session: 20250806122630
HighPow Impedance: 33 Ohm
Implantable Lead Connection Status: 753985
Implantable Lead Connection Status: 753985
Implantable Lead Implant Date: 20120926
Implantable Lead Implant Date: 20120926
Implantable Lead Location: 753859
Implantable Lead Location: 753860
Implantable Lead Model: 5076
Implantable Pulse Generator Implant Date: 20190625
Lead Channel Impedance Value: 1216 Ohm
Lead Channel Impedance Value: 380 Ohm
Lead Channel Impedance Value: 456 Ohm
Lead Channel Pacing Threshold Amplitude: 0.75 V
Lead Channel Pacing Threshold Amplitude: 1.375 V
Lead Channel Pacing Threshold Pulse Width: 0.4 ms
Lead Channel Pacing Threshold Pulse Width: 0.4 ms
Lead Channel Sensing Intrinsic Amplitude: 0.25 mV
Lead Channel Sensing Intrinsic Amplitude: 0.25 mV
Lead Channel Sensing Intrinsic Amplitude: 4.625 mV
Lead Channel Sensing Intrinsic Amplitude: 4.625 mV
Lead Channel Setting Pacing Amplitude: 0.5 V
Lead Channel Setting Pacing Amplitude: 2 V
Lead Channel Setting Pacing Pulse Width: 0.03 ms
Lead Channel Setting Sensing Sensitivity: 0.3 mV
Zone Setting Status: 755011
Zone Setting Status: 755011

## 2024-07-30 NOTE — Progress Notes (Signed)
 [facility] [place]  Patient name: Connor Burns. MRN: [id number] DOB: [birth year] Sex: male   REQUESTING PROVIDER:   Dr. Marolyn   REASON FOR CONSULT:    Lower extremity wound  HISTORY OF PRESENT ILLNESS:   Pacer Dorn. is a 68 y.o. male, who is referred for evaluation of bilateral lower extremity wounds.  He was seen by Dr. Eliza in 2015 for a second toe wound on the left that ultimately healed without intervention.   Patient has a history of coronary artery disease, status post MI and CABG.  He is on Eliquis  for atrial fibrillation and history of DVT.  He takes a statin for hypercholesterolemia.  He is medically managed for hypertension he is a non-smoker and is diabetic.  He has chronic renal insufficiency he has an ICD in place  PAST MEDICAL HISTORY    Past Medical History:  Diagnosis Date   Acute osteomyelitis of ankle and foot 05/17/2014   Acute systolic heart failure (HCC) 09/12/2023   Arthritis 10/27/2020   Wrist   Atherosclerosis of native arteries of the extremities with ulceration 06/15/2014   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   veral times a day for 10 minutes each time; The patient was also instructed to hold his Xarelto  tonight and tomorrow night.  He is to restart his Xarelto  on Sunday night.   CAD (coronary artery disease)    CHF (congestive heart failure) 01/14/2017   Intermittent CHF with lower extremity edema.  I have given him Lasix  to take on a p.r.n. basis.Formatting of this note might be different from the original. Last Assessment & Plan: Formatting of this note might be different from the original. Intermittent CHF with lower extremity edema.  I have given him Las   Chronic kidney disease 10/27/2020   Stage II   Congestive cardiomyopathy 01/14/2017   Cardiomyopathy with LV dysfunction.Last ejection fraction 40-45% by echocardiography in 2012.Patient  currently not taking beta-blocker Ace inhibitor for some reason.We will repeat echocardiography to evaluate LV function then determine the need for afterload reduction and beta-blocker therapy.[place]   Coronary atherosclerosis 01/14/2017   Status remote CABG in 2004 in [place] .Left internal mammary artery to the obtuse marginal.Free Right internal mammary artery to the LAD.Saphenous vein graft to OM 2.Saphenous vein graft to the RCA.  Asymptomatic without chest pain or shortness of breath.Last nuclear stress test May 2018:  Large   DVT (deep venous thrombosis)    Dyslipidemia 05/07/2018   Hematuria 08/10/2015   Hypertension    ICD (implantable cardioverter-defibrillator) battery depletion 06/16/2018   Status post dual-chamber ICD generator change 06/16/2018.Last Assessment & Plan: Formatting of this note might be different from the original.Left infraclavicular incision intact with Dermabond.  There is no pocket hematoma present.  No drainage noted.Device interrogation this a.m. reveals appropriate device function.   Ischemic cardiomyopathy 06/18/2016   Echo 09/14/2011 showing LA 4.9cm, EF 40-45%.  Mild to moderate decreased LV systolic function.  Mild concentric LVH.  LA is moderately dilated.  Mild aortic cusp sclerosis. Aortic valve appears to be bicuspid.B. Concerning for potential dysrhythmia (ventricular tachycardia) especially given documented nonsustained ventricular tachy   Mild dementia, unclear etiology 12/30/2022   Myocardial infarction    Obesity (BMI 30-39.9) 08/10/2015   Onychomycosis 05/10/2014   Painful legs and moving toes of left foot 05/10/2014   Paroxysmal atrial fibrillation 08/08/2015   A. Paroxysmal.B. On Sotalol therapy.Last Assessment & Plan: History of present atrial fibrillation.  Patient will 80 mg twice  daily.Also on rivaroxaban  20 mg a day.Patient again refused an ECG.I explained to the patient, that the absence of an EKG, I canno   Peripheral vascular disease     Shortness of breath 10/27/2020   Exertional   Stroke 10/27/2020   Right frontal infarct per neuroimaging dating back at least to 2013   Syncope 01/14/2017   In the setting of ischemic cardiomyopathy. B. Concerning for potential dysrhythmia (ventricular tachycardia) especially given documented nonsustained ventricular tachycardia - EPS 9/25/201 showing reproducibly inducible nonsustained monomorphic VT up to 10 beat.  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 In the setting of ischemic cardiomyopathy. B. Concerning for potential dysrhythmia (ventricular tachycardia) especially given documented nonsustained ventricular tachycardia - EPS 9/25/201 showing reproducibly inducible nonsustained monomorphic VT up to 10 beat.  Reproducibly inducible nonsustained polymorphic VT.  Inducible VF wi     FAMILY HISTORY   Family History  Problem Relation Age of Onset   Heart disease Mother        before age 32   Hyperlipidemia Mother    Hypertension Mother    Heart attack Mother    Hyperlipidemia Father    Hypertension Father    Memory loss Brother    Memory loss Brother    Memory loss Half-Sister    Memory loss Maternal Aunt     SOCIAL HISTORY:   Social History   Socioeconomic History   Marital status: Married    Spouse name: [name]   Number of children: 1   Years of education: 4   Highest education level: Master's degree (e.g., MA, MS, MEng, MEd, MSW, MBA)  Occupational History   Occupation: Chaplain    Comment: [employer]    Comment: newly retired  Tobacco Use   Smoking status: Never   Smokeless tobacco: Never  Vaping Use   Vaping status: Never Used  Substance and Sexual Activity   Alcohol use: No   Drug use: No   Sexual activity: Not on file  Other Topics Concern   Not on file  Social History Narrative   Lives with wife in a 2 story  home.  Has 3 children.     Works as a Metallurgist.     Education: Masters in [employer].   Right handed   Caffeine yes   Social Drivers of Health   Financial Resource Strain: Medium Risk (02/19/2024)   Overall Financial Resource Strain (CARDIA)    Difficulty of Paying Living Expenses: Somewhat hard  Food Insecurity: No Food Insecurity (06/11/2024)   Received [facility]   Hunger Vital Sign    Within the past 12 months, you worried that your food would run out before you got the money to buy more.: Never true    Within the past 12 months, the food you bought just didn't last and you didn't have money to get more.: Never true  Transportation Needs: No Transportation Needs (06/11/2024)   Received from [facility]   PRAPARE - Transportation    Lack of Transportation (Medical): No    Lack of Transportation (Non-Medical): No  Physical Activity: Not on file  Stress: Not on file  Social Connections: Not on file  Intimate Partner Violence: Patient Unable To Answer (02/25/2024)   Humiliation, Afraid, Rape, and Kick questionnaire    Fear of Current or Ex-Partner: Patient unable to answer  Emotionally Abused: Patient unable to answer    Physically Abused: Patient unable to answer    Sexually Abused: Patient unable to answer    ALLERGIES:    Allergies  Allergen Reactions   Clopidogrel Anaphylaxis and Other (See Comments)    Wierd, like out of body thing Out of body experience    Plavix [Clopidogrel Bisulfate]     CURRENT MEDICATIONS:    Current Outpatient Medications  Medication Sig Dispense Refill   apixaban  (ELIQUIS ) 5 MG TABS tablet Take 1 tablet (5 mg total) by mouth 2 (two) times daily. 60 tablet 3   atorvastatin  (LIPITOR) 20 MG tablet Take 1 tablet (20 mg total) by mouth daily. 30 tablet 2   carvedilol  (COREG ) 3.125 MG tablet Take 1 tablet (3.125 mg total) by mouth 2 (two) times daily. 60 tablet 2   cyanocobalamin  (VITAMIN B12) 1000 MCG tablet  Take 1 tablet (1,000 mcg total) by mouth daily. 30 tablet 2   donepezil  (ARICEPT ) 10 MG tablet Take 1 tablet (10 mg total) by mouth daily. 90 tablet 3   empagliflozin  (JARDIANCE ) 10 MG TABS tablet Take 1 tablet (10 mg total) by mouth daily. 90 tablet 1   isosorbide  mononitrate (IMDUR ) 30 MG 24 hr tablet Take 30 mg by mouth daily. Patient is taking 1/2 tablet daily per instructions on his prescription bottle (Patient not taking: Reported on 07/28/2024)     potassium chloride  SA (KLOR-CON  M) 20 MEQ tablet Take 3 tablets (60 mEq total) by mouth 2 (two) times daily. 180 tablet 3   risperiDONE  (RISPERDAL ) 0.5 MG tablet Take 1 tablet (0.5 mg total) by mouth daily. 30 tablet 1   silver  sulfADIAZINE  (SILVADENE ) 1 % cream Apply 1 Application topically 2 (two) times daily. Clean site of wound and apply cream. 400 g 1   SV IRON 325 (65 Fe) MG tablet Take 325 mg by mouth 3 (three) times a week. (Patient not taking: Reported on 07/28/2024)     torsemide  (DEMADEX ) 20 MG tablet Take 4 tablets (80 mg total) by mouth 2 (two) times daily. 240 tablet 5   No current facility-administered medications for this visit.    REVIEW OF SYSTEMS:   [X]  denotes positive finding, [ ]  denotes negative finding Cardiac  Comments:  Chest pain or chest pressure:    Shortness of breath upon exertion:    Short of breath when lying flat:    Irregular heart rhythm:        Vascular    Pain in calf, thigh, or hip brought on by ambulation:    Pain in feet at night that wakes you up from your sleep:     Blood clot in your veins:    Leg swelling:         Pulmonary    Oxygen at home:    Productive cough:     Wheezing:         Neurologic    Sudden weakness in arms or legs:     Sudden numbness in arms or legs:     Sudden onset of difficulty speaking or slurred speech:    Temporary loss of vision in one eye:     Problems with dizziness:         Gastrointestinal    Blood in stool:      Vomited blood:         Genitourinary     Burning when urinating:     Blood in urine:  Psychiatric    Major depression:         Hematologic    Bleeding problems:    Problems with blood clotting too easily:        Skin    Rashes or ulcers:        Constitutional    Fever or chills:     PHYSICAL EXAM:   Vitals:   07/30/24 1453  BP: 130/83  Pulse: 83  Temp: 97.9 F (36.6 C)  SpO2: 98%  Weight: 286 lb (129.7 kg)  Height: 6' 2 (1.88 m)    GENERAL: The patient is a well-nourished male, in no acute distress. The vital signs are documented above. CARDIAC: There is a regular rate and rhythm.  VASCULAR: Palpable femoral pulses bilaterally.  Nonpalpable pedal pulses with bilateral lower extremity edema and ulceration PULMONARY: Nonlabored respirations ABDOMEN: Soft and non-tender with normal pitched bowel sounds.  MUSCULOSKELETAL: There are no major deformities or cyanosis. NEUROLOGIC: No focal weakness or paresthesias are detected. SKIN: See photo below PSYCHIATRIC: The patient has a normal affect.     STUDIES:   I have reviewed the following: +---------+------------------+-----+----------+--------+  Right   Rt Pressure (mmHg)IndexWaveform  Comment   +---------+------------------+-----+----------+--------+  Brachial 130                                        +---------+------------------+-----+----------+--------+  PTA     78                0.60 monophasic          +---------+------------------+-----+----------+--------+  DP      86                0.66 monophasic          +---------+------------------+-----+----------+--------+  Great Toe61                0.47                     +---------+------------------+-----+----------+--------+   +---------+------------------+-----+----------+------------------+  Left    Lt Pressure (mmHg)IndexWaveform  Comment             +---------+------------------+-----+----------+------------------+  Brachial 131                                                   +---------+------------------+-----+----------+------------------+  PTA     86                0.66 monophasicaudibly monophasic  +---------+------------------+-----+----------+------------------+  DP      86                0.66 monophasicaudibly monophasic  +---------+------------------+-----+----------+------------------+  Great Toe47                0.36                               +---------+------------------+-----+----------+------------------+    Right: 30-49% stenosis noted in the common femoral artery.   Left: Patent lower extremity without obvious evidence of stenosis, however  portions of this exam were limited.   suspect inflow disease.   ASSESSMENT and PLAN   Bilateral lower extremity wounds: The calf wounds appear to be related to his  volume overload.  I have recommended that he continue his elevation and compression as well as wound care management and these should heal over time.  He does have a small ulcer on his left heel which is very superficial.  He needs to be offloaded for this.  He may benefit from angiography.  He had easily palpable femoral pulse on the left stenosis noted on ultrasound so I am going to monitor his left heel wound for 1 month and have him come back to clinic for repeat evaluation.  If it is not got any better he will need to have angiography   Malvina New, IV, MD, FACS [facility] [phone] Pager [phone]

## 2024-07-30 NOTE — Telephone Encounter (Signed)
 Call x3; [email or web address] to see EP APP discuss ICD therapy and atrial fibrillation per Camnitz.

## 2024-08-02 ENCOUNTER — Ambulatory Visit: Payer: Self-pay | Admitting: Cardiology

## 2024-08-02 ENCOUNTER — Encounter (HOSPITAL_BASED_OUTPATIENT_CLINIC_OR_DEPARTMENT_OTHER): Admitting: General Surgery

## 2024-08-02 DIAGNOSIS — L97812 Non-pressure chronic ulcer of other part of right lower leg with fat layer exposed: Secondary | ICD-10-CM | POA: Diagnosis not present

## 2024-08-02 DIAGNOSIS — L97822 Non-pressure chronic ulcer of other part of left lower leg with fat layer exposed: Secondary | ICD-10-CM | POA: Diagnosis not present

## 2024-08-02 DIAGNOSIS — I872 Venous insufficiency (chronic) (peripheral): Secondary | ICD-10-CM | POA: Diagnosis not present

## 2024-08-02 DIAGNOSIS — L97819 Non-pressure chronic ulcer of other part of right lower leg with unspecified severity: Secondary | ICD-10-CM | POA: Diagnosis not present

## 2024-08-02 DIAGNOSIS — L97422 Non-pressure chronic ulcer of left heel and midfoot with fat layer exposed: Secondary | ICD-10-CM | POA: Diagnosis not present

## 2024-08-03 ENCOUNTER — Encounter: Payer: Self-pay | Admitting: Podiatry

## 2024-08-03 ENCOUNTER — Ambulatory Visit (INDEPENDENT_AMBULATORY_CARE_PROVIDER_SITE_OTHER): Admitting: Podiatry

## 2024-08-03 DIAGNOSIS — M79675 Pain in left toe(s): Secondary | ICD-10-CM | POA: Diagnosis not present

## 2024-08-03 DIAGNOSIS — M79674 Pain in right toe(s): Secondary | ICD-10-CM

## 2024-08-03 DIAGNOSIS — B351 Tinea unguium: Secondary | ICD-10-CM | POA: Diagnosis not present

## 2024-08-03 DIAGNOSIS — D689 Coagulation defect, unspecified: Secondary | ICD-10-CM | POA: Diagnosis not present

## 2024-08-03 MED ORDER — CICLOPIROX 8 % EX SOLN: Freq: Every day | CUTANEOUS | 12 refills | Status: AC

## 2024-08-03 NOTE — Progress Notes (Unsigned)
  Subjective:  Patient ID: Connor Burns., male    DOB: [birth year],  MRN: [id number]  Chief Complaint  Patient presents with   RFC    RFC with out callous, being seen by Wound on weekly basis with cone. Takes eliquis , [facility] [place]    68 y.o. male presents with the above complaint. History confirmed with patient. Patient presenting with pain related to dystrophic thickened elongated nails. Patient is unable to trim own nails related to nail dystrophy and/or mobility issues. Patient does not have a history of T2DM.  He is on Eliquis  and does have venous stasis ulcerations followed by wound care center, does have leg wraps applied.  Objective:  Physical Exam: warm, good capillary refill, pedal skin atrophic, decreased pedal hair growth nail exam onychomycosis of the toenails, onycholysis, and dystrophic nails DP pulses palpable, PT pulses palpable, and protective sensation intact Left Foot:  Pain with palpation of nails due to elongation and dystrophic growth.  Right Foot: Pain with palpation of nails due to elongation and dystrophic growth.  Multilayer compression wraps left intact bilaterally.  Assessment:   1. Pain due to onychomycosis of toenails of both feet   2. Coagulation defect [facility])      Plan:  Patient was evaluated and treated and all questions answered.  #Onychomycosis with pain  -Nails palliatively debrided as below. -Educated on self-care - High risk footcare, is anticoagulated on Eliquis  - Given his wife want to discuss topical treatment for onychomycosis.  Prescription for ciclopirox  sent to patient's pharmacy.  Recommend they use urea nail gel 40% as well to help increase penetration of the medication.  Procedure: Nail Debridement Rationale: Pain Type of Debridement: manual, sharp debridement. Instrumentation: Nail nipper, rotary burr. Number of Nails: 10  Return in about 3 months (around 11/03/2024) for Routine Foot Care.         Ethan Saddler, DPM [facility]

## 2024-08-03 NOTE — Patient Instructions (Signed)
 Look for urea 40% nail gel, cream or ointment and apply to the thickened nails. This can be bought over the counter, at a pharmacy or online such as [employer].

## 2024-08-06 ENCOUNTER — Encounter: Payer: Self-pay | Admitting: Emergency Medicine

## 2024-08-06 ENCOUNTER — Encounter: Payer: Self-pay | Admitting: Clinical Cardiac Electrophysiology

## 2024-08-06 NOTE — Telephone Encounter (Signed)
 Certified letter sent to patient after 3 unsuccessful attempts of trying to reach pt to schedule.

## 2024-08-09 ENCOUNTER — Ambulatory Visit (HOSPITAL_BASED_OUTPATIENT_CLINIC_OR_DEPARTMENT_OTHER): Admitting: General Surgery

## 2024-08-10 DIAGNOSIS — R0602 Shortness of breath: Secondary | ICD-10-CM | POA: Diagnosis not present

## 2024-08-13 ENCOUNTER — Other Ambulatory Visit (HOSPITAL_COMMUNITY)

## 2024-08-13 ENCOUNTER — Ambulatory Visit (HOSPITAL_BASED_OUTPATIENT_CLINIC_OR_DEPARTMENT_OTHER): Admitting: General Surgery

## 2024-08-19 ENCOUNTER — Encounter (HOSPITAL_BASED_OUTPATIENT_CLINIC_OR_DEPARTMENT_OTHER): Admitting: General Surgery

## 2024-08-19 DIAGNOSIS — L97819 Non-pressure chronic ulcer of other part of right lower leg with unspecified severity: Secondary | ICD-10-CM | POA: Diagnosis not present

## 2024-08-20 ENCOUNTER — Telehealth (HOSPITAL_COMMUNITY): Payer: Self-pay

## 2024-08-20 NOTE — Progress Notes (Signed)
 Advanced Heart Failure Clinic Note   Primary Care: Georgina Speaks, FNP Primary Cardiologist: Dr. Sheena Nephrology: Dr. Tobie ([facility])  HPI: 68 y.o. male with history of CAD s/p MI/CABG in 2004 in [place], HFrEF/ICM s/p ICD, longstanding persistent atrial fibrillation, hx VT, CKD, hx DVT, obesity, dementia, PAD. He previously slaw Dr. Okey in 2018, then followed with Cardiology in [place], [place]. Most recently established with Dr. Sheena in 03/24 after moving back to the area.   Had cath in 2015. Report not available. Available records suggest 2 of his vein grafts were occluded.  Echo 2019: EF 30-35%  Echo at OSH 03/24: EF 30-35%, RV function not assessed  He had a stress test in 3/24 with large area of infarction in inferior wall without reversible ischemia, LVEF 23%. Study was reviewed by Dr. Sheena. Decision made to defer LHC d/t kidney function.   He was admitted 9/24 with acute respiratory failure with hypoxia 2/2 acute on chronic CHF. Had not been compliant with medications. Echo during admit with EF 35-40%, RWMA, RV mildly reduced. Cardiology consulted. He was diuresed. GDMT limited by CKD, blood pressure too soft for BiDil. Weight on admit 294 lb >>> 225 lb at discharge.   He was seen in TOC x 2 post hospital and was instructed to continue ongoing care w/ general cardiology w/ f/u in [facility] as needed.   Follow up 2/25 with [facility] and was significantly volume overloaded, wt was up ~50 lb, in the setting of dietary indiscretion and nonadherance to home diuretic regimen. Furoscix  was considered but pt declined citing fear of needles. Torsemide  was increased and he was instructed to follow back up in the [facility].   Admitted 6/25 to OOH in [place] , with AMS. Got in his car and drove from [place] to [place]. Found by police at a gas station, and taken to hospital in [place]. GDMT stopped, given IVF. Found to have elevated LFTs, felt to be hepatic congestion, unable to tolerate MRCP. Ltd echo showed EF 41-45%.  AMS felt to be 2/2 to progressive dementia and he was started on risperidone . Cards consulted due to HF and diuresed with IV lasix , and GDMT restarted.  Today he returns for HF follow up with his wife. Most of history from wife due to patient's dementia. He is not taking medications per wife. He is SOB if he exerts himself. He is swelling. Denies palpitations, abnormal bleeding, CP, dizziness, or PND/Orthopnea. Appetite ok. Weight at home 279 pounds. Uses [facility] and goes to adult center during the day. Wife works during the day.   Past Medical History:  Diagnosis Date   Acute osteomyelitis of ankle and foot 05/17/2014   Acute systolic heart failure (HCC) 09/12/2023   Arthritis 10/27/2020   Wrist   Atherosclerosis of native arteries of the extremities with ulceration 06/15/2014   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   veral times a day for 10 minutes each time; The patient was also instructed to hold his Xarelto  tonight and tomorrow night.  He is to restart his Xarelto  on Sunday night.   CAD (coronary artery disease)    CHF (congestive heart failure) 01/14/2017   Intermittent CHF with lower extremity edema.  I have given him Lasix  to take on a p.r.n. basis.Formatting of this note might be different from the original. Last Assessment & Plan: Formatting of this note might be different from the original. Intermittent CHF with lower extremity edema.  I have given him Las   Chronic kidney disease  10/27/2020   Stage II   Congestive cardiomyopathy 01/14/2017   Cardiomyopathy with LV dysfunction.Last ejection fraction 40-45% by echocardiography in 2012.Patient currently not taking beta-blocker Ace inhibitor for some reason.We will repeat echocardiography to evaluate LV function then determine the need for afterload reduction and beta-blocker therapy.[place]   Coronary atherosclerosis 01/14/2017   Status remote CABG in 2004 in [place] .Left internal mammary artery to the  obtuse marginal.Free Right internal mammary artery to the LAD.Saphenous vein graft to OM 2.Saphenous vein graft to the RCA.  Asymptomatic without chest pain or shortness of breath.Last nuclear stress test May 2018:  Large   DVT (deep venous thrombosis)    Dyslipidemia 05/07/2018   Hematuria 08/10/2015   Hypertension    ICD (implantable cardioverter-defibrillator) battery depletion 06/16/2018   Status post dual-chamber ICD generator change 06/16/2018.Last Assessment & Plan: Formatting of this note might be different from the original.Left infraclavicular incision intact with Dermabond.  There is no pocket hematoma present.  No drainage noted.Device interrogation this a.m. reveals appropriate device function.   Ischemic cardiomyopathy 06/18/2016   Echo 09/14/2011 showing LA 4.9cm, EF 40-45%.  Mild to moderate decreased LV systolic function.  Mild concentric LVH.  LA is moderately dilated.  Mild aortic cusp sclerosis. Aortic valve appears to be bicuspid.B. Concerning for potential dysrhythmia (ventricular tachycardia) especially given documented nonsustained ventricular tachy   Mild dementia, unclear etiology 12/30/2022   Myocardial infarction    Obesity (BMI 30-39.9) 08/10/2015   Onychomycosis 05/10/2014   Painful legs and moving toes of left foot 05/10/2014   Paroxysmal atrial fibrillation 08/08/2015   A. Paroxysmal.B. On Sotalol therapy.Last Assessment & Plan: History of present atrial fibrillation.  Patient will 80 mg twice daily.Also on rivaroxaban  20 mg a day.Patient again refused an ECG.I explained to the patient, that the absence of an EKG, I canno   Peripheral vascular disease    Shortness of breath 10/27/2020   Exertional   Stroke 10/27/2020   Right frontal infarct per neuroimaging dating back at least to 2013   Syncope 01/14/2017   In the setting of ischemic cardiomyopathy. B. Concerning for potential dysrhythmia (ventricular tachycardia) especially given documented nonsustained  ventricular tachycardia - EPS 9/25/201 showing reproducibly inducible nonsustained monomorphic VT up to 10 beat.  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 In the setting of ischemic cardiomyopathy. B. Concerning for potential dysrhythmia (ventricular tachycardia) especially given documented nonsustained ventricular tachycardia - EPS 9/25/201 showing reproducibly inducible nonsustained monomorphic VT up to 10 beat.  Reproducibly inducible nonsustained polymorphic VT.  Inducible VF wi   Current Outpatient Medications  Medication Sig Dispense Refill   apixaban  (ELIQUIS ) 5 MG TABS tablet Take 1 tablet (5 mg total) by mouth 2 (two) times daily. 60 tablet 3   atorvastatin  (LIPITOR) 20 MG tablet Take 1 tablet (20 mg total) by mouth daily. 30 tablet 2   carvedilol  (COREG ) 3.125 MG tablet Take 1 tablet (3.125 mg total) by mouth 2 (two) times daily. 60 tablet 2   cyanocobalamin  (VITAMIN B12) 1000 MCG tablet Take 1 tablet (1,000 mcg total) by mouth daily. 30 tablet 2   donepezil  (ARICEPT ) 10 MG tablet Take 1 tablet (10 mg total) by mouth daily. 90 tablet 3   empagliflozin  (JARDIANCE ) 10 MG TABS tablet Take 1 tablet (10 mg total) by mouth daily. 90 tablet 1   isosorbide  mononitrate (IMDUR ) 30 MG 24 hr tablet  Take 30 mg by mouth daily. Patient is taking 1/2 tablet daily per instructions on his prescription bottle     potassium chloride  SA (KLOR-CON  M) 20 MEQ tablet Take 3 tablets (60 mEq total) by mouth 2 (two) times daily. 180 tablet 3   risperiDONE  (RISPERDAL ) 0.5 MG tablet Take 1 tablet (0.5 mg total) by mouth daily. 30 tablet 1   SV IRON 325 (65 Fe) MG tablet Take 325 mg by mouth 3 (three) times a week.     torsemide  (DEMADEX ) 20 MG tablet Take 4 tablets (80 mg total) by mouth 2 (two) times daily. 240 tablet 5   ciclopirox  (PENLAC ) 8 % solution Apply topically at  bedtime. Apply over nail and surrounding skin. Apply daily over previous coat. After seven (7) days, may remove with alcohol and continue cycle. (Patient not taking: Reported on 08/24/2024) 6.6 mL 12   silver  sulfADIAZINE  (SILVADENE ) 1 % cream Apply 1 Application topically 2 (two) times daily. Clean site of wound and apply cream. (Patient not taking: Reported on 08/24/2024) 400 g 1   No current facility-administered medications for this encounter.   Allergies  Allergen Reactions   Clopidogrel Anaphylaxis and Other (See Comments)    Wierd, like out of body thing Out of body experience    Plavix [Clopidogrel Bisulfate]    Social History   Socioeconomic History   Marital status: Married    Spouse name: [name]   Number of children: 1   Years of education: 38   Highest education level: Master's degree (e.g., MA, MS, MEng, MEd, MSW, MBA)  Occupational History   Occupation: Chaplain    Comment: [employer]    Comment: newly retired  Tobacco Use   Smoking status: Never   Smokeless tobacco: Never  Vaping Use   Vaping status: Never Used  Substance and Sexual Activity   Alcohol use: No   Drug use: No   Sexual activity: Not on file  Other Topics Concern   Not on file  Social History Narrative   Lives with wife in a 2 story home.  Has 3 children.     Works as a Metallurgist.     Education: Masters in [employer].   Right handed   Caffeine yes   Social Drivers of Health   Financial Resource Strain: Medium Risk (02/19/2024)   Overall Financial Resource Strain (CARDIA)    Difficulty of Paying Living Expenses: Somewhat hard  Food Insecurity: No Food Insecurity (06/11/2024)   Received from [facility]   Hunger Vital Sign    Within the past 12 months, you worried that your food would run out before you got the money to buy more.: Never true    Within the past 12 months, the food you bought just didn't last and you didn't have money to get more.: Never true  Transportation  Needs: No Transportation Needs (06/11/2024)   Received from [facility]   PRAPARE - Transportation    Lack of Transportation (Medical): No    Lack of Transportation (Non-Medical): No  Physical Activity: Not on file  Stress: Not on file  Social Connections: Not on file  Intimate Partner Violence: Patient Unable To Answer (02/25/2024)   Humiliation, Afraid, Rape, and Kick questionnaire    Fear of Current or Ex-Partner: Patient unable to answer    Emotionally Abused: Patient unable to answer    Physically Abused: Patient unable to answer    Sexually Abused: Patient unable to answer    Family  History  Problem Relation Age of Onset   Heart disease Mother        before age 3   Hyperlipidemia Mother    Hypertension Mother    Heart attack Mother    Hyperlipidemia Father    Hypertension Father    Memory loss Brother    Memory loss Brother    Memory loss Half-Sister    Memory loss Maternal Aunt    Wt Readings from Last 3 Encounters:  08/24/24 (!) 137.5 kg (303 lb 3.2 oz)  07/30/24 129.7 kg (286 lb)  07/28/24 135.3 kg (298 lb 3.2 oz)   BP 135/85   Pulse 83   Ht 6' 2 (1.88 m)   Wt (!) 137.5 kg (303 lb 3.2 oz)   SpO2 96%   BMI 38.93 kg/m   PHYSICAL EXAM: General:  NAD. No resp difficulty, walked into clinic with RW, elderly, chronically-ill appearing HEENT: peri-orbital swelling, icteric sclera Neck: Supple. JVP 12+ Cor: Irregular rate & rhythm. No rubs, gallops or murmurs. Lungs: Basilar crackles Abdomen:  obese, nontender, +distended.  Extremities: No cyanosis, clubbing, rash, 2+ BLE edema, ace wraps to legs Neuro: Alert & oriented x 3, moves all 4 extremities w/o difficulty. Affect pleasant.  Device interrogation (personally reviewed): OptiVol up, 100% AF, no VT, 0.3 hr/day activity, VP 2.8%  ASSESSMENT & PLAN: Acute on Chronic Systolic Heart Failure - iCM - Hx MI s/p CABG in 2004 - s/p ICD - EF has been in the 30-35% x years - Echo 9/24 : EF 35-40%, RV  mildly reduced - Echo 6/25: EF 40-45% - NYHA IIIb, volume up on exam and device interrogation, weight up another 5 lbs. - Not taking his medications, ? If mood disturbance present with dementia. - Discussed admission for IV diuresis, patient declines. - Will try Furoscix  + 40 KCL daily x 3 days, hold torsemide  while using Furoscix  - After 3 days, restart torsemide  80 mg bid + 60 KCL bid. - Restart Jardiance  10 mg daily.  - Continue Coreg  3.125 mg bid. - Labs today, repeat BMET & Mag in 1 week.  2. CAD - Hx MI and CABG (LIMA to OM, Free RIMA to LAD, SVG to OM2, SVG to RCA) in 2003. - Records report 2 vein grafts occluded on cath in 2015. - No chest pain. - No ASA with anticoagulation. - Continue statin.  3. Persistent atrial fibrillation - hx Afib ablation - Pursuing rate control - Continue Coreg . - Continue Eliquis  5 mg bid. - Followed by EP.  4. CKD IV - Baseline SCr 2.3-2.5. - Continue Jardiance . - Labs today  5. Dementia - Follows with Neurology.   - Wife is assisting with medications  6. VT - Hx ICD placement - Established with EP  7. PAD - Previously followed by Dr Eliza with [facility], has hx of osteomyelitis - Denies claudication but generally not active - Chronic LE wound, followed by Wound Clinic - ABIs showed moderate PVD in bilateral legs - Continue statin + AC  8. SDOH - uses [facility], at adult center during the day - Wife works & manges his meds, not much home support - Reach out to HFSW, not Paramedicine candidate due to location. [place] lives in [place]  Follow up in 1 weeks with APP (BMET, Mag and ECG). He is high-risk for re-admission. Assign to Dr. Gardenia.  Harlene CHRISTELLA Gainer, FNP-BC 08/24/24

## 2024-08-20 NOTE — Telephone Encounter (Signed)
 Called to confirm/remind patient of their appointment at the Advanced Heart Failure Clinic on 08/20/2024.   Appointment:   [x] Confirmed  [] Left mess   [] No answer/No voice mail  [] VM Full/unable to leave message  [] Phone not in service  Patient reminded to bring all medications and/or complete list.  Confirmed patient has transportation. Gave directions, instructed to utilize valet parking.

## 2024-08-24 ENCOUNTER — Ambulatory Visit (HOSPITAL_BASED_OUTPATIENT_CLINIC_OR_DEPARTMENT_OTHER)
Admission: RE | Admit: 2024-08-24 | Discharge: 2024-08-24 | Disposition: A | Discharge status: Home / Self Care | Source: Ambulatory Visit | Attending: Family Medicine | Admitting: Family Medicine

## 2024-08-24 ENCOUNTER — Encounter (HOSPITAL_COMMUNITY): Payer: Self-pay

## 2024-08-24 ENCOUNTER — Other Ambulatory Visit: Payer: Self-pay | Admitting: Nurse Practitioner

## 2024-08-24 ENCOUNTER — Telehealth (HOSPITAL_COMMUNITY): Payer: Self-pay | Admitting: Pharmacy Technician

## 2024-08-24 ENCOUNTER — Other Ambulatory Visit (HOSPITAL_COMMUNITY): Payer: Self-pay

## 2024-08-24 VITALS — BP 135/85 | HR 83 | Ht 74.0 in | Wt 303.2 lb

## 2024-08-24 DIAGNOSIS — Z79899 Other long term (current) drug therapy: Secondary | ICD-10-CM | POA: Insufficient documentation

## 2024-08-24 DIAGNOSIS — I4819 Other persistent atrial fibrillation: Secondary | ICD-10-CM | POA: Diagnosis not present

## 2024-08-24 DIAGNOSIS — Z139 Encounter for screening, unspecified: Secondary | ICD-10-CM | POA: Diagnosis not present

## 2024-08-24 DIAGNOSIS — E669 Obesity, unspecified: Secondary | ICD-10-CM | POA: Insufficient documentation

## 2024-08-24 DIAGNOSIS — F03A0 Unspecified dementia, mild, without behavioral disturbance, psychotic disturbance, mood disturbance, and anxiety: Secondary | ICD-10-CM | POA: Insufficient documentation

## 2024-08-24 DIAGNOSIS — N184 Chronic kidney disease, stage 4 (severe): Secondary | ICD-10-CM | POA: Diagnosis not present

## 2024-08-24 DIAGNOSIS — I509 Heart failure, unspecified: Secondary | ICD-10-CM | POA: Diagnosis not present

## 2024-08-24 DIAGNOSIS — Z95810 Presence of automatic (implantable) cardiac defibrillator: Secondary | ICD-10-CM | POA: Insufficient documentation

## 2024-08-24 DIAGNOSIS — N189 Chronic kidney disease, unspecified: Secondary | ICD-10-CM | POA: Diagnosis not present

## 2024-08-24 DIAGNOSIS — I5023 Acute on chronic systolic (congestive) heart failure: Secondary | ICD-10-CM | POA: Insufficient documentation

## 2024-08-24 DIAGNOSIS — I2510 Atherosclerotic heart disease of native coronary artery without angina pectoris: Secondary | ICD-10-CM | POA: Insufficient documentation

## 2024-08-24 DIAGNOSIS — Z7901 Long term (current) use of anticoagulants: Secondary | ICD-10-CM | POA: Insufficient documentation

## 2024-08-24 DIAGNOSIS — I130 Hypertensive heart and chronic kidney disease with heart failure and stage 1 through stage 4 chronic kidney disease, or unspecified chronic kidney disease: Secondary | ICD-10-CM | POA: Insufficient documentation

## 2024-08-24 DIAGNOSIS — Z7984 Long term (current) use of oral hypoglycemic drugs: Secondary | ICD-10-CM | POA: Insufficient documentation

## 2024-08-24 DIAGNOSIS — Z86718 Personal history of other venous thrombosis and embolism: Secondary | ICD-10-CM | POA: Insufficient documentation

## 2024-08-24 DIAGNOSIS — R0602 Shortness of breath: Secondary | ICD-10-CM | POA: Diagnosis not present

## 2024-08-24 DIAGNOSIS — E876 Hypokalemia: Secondary | ICD-10-CM | POA: Diagnosis not present

## 2024-08-24 DIAGNOSIS — I739 Peripheral vascular disease, unspecified: Secondary | ICD-10-CM | POA: Diagnosis not present

## 2024-08-24 DIAGNOSIS — I252 Old myocardial infarction: Secondary | ICD-10-CM | POA: Insufficient documentation

## 2024-08-24 DIAGNOSIS — F0390 Unspecified dementia without behavioral disturbance: Secondary | ICD-10-CM | POA: Diagnosis not present

## 2024-08-24 DIAGNOSIS — I4811 Longstanding persistent atrial fibrillation: Secondary | ICD-10-CM | POA: Insufficient documentation

## 2024-08-24 DIAGNOSIS — Z6838 Body mass index (BMI) 38.0-38.9, adult: Secondary | ICD-10-CM | POA: Insufficient documentation

## 2024-08-24 DIAGNOSIS — I4891 Unspecified atrial fibrillation: Secondary | ICD-10-CM | POA: Diagnosis not present

## 2024-08-24 DIAGNOSIS — Z951 Presence of aortocoronary bypass graft: Secondary | ICD-10-CM | POA: Insufficient documentation

## 2024-08-24 DIAGNOSIS — I472 Ventricular tachycardia, unspecified: Secondary | ICD-10-CM

## 2024-08-24 LAB — BASIC METABOLIC PANEL WITH GFR
Anion gap: 11 (ref 5–15)
BUN: 26 mg/dL — ABNORMAL HIGH (ref 8–23)
CO2: 30 mmol/L (ref 22–32)
Calcium: 8.7 mg/dL — ABNORMAL LOW (ref 8.9–10.3)
Chloride: 98 mmol/L (ref 98–111)
Creatinine, Ser: 2.09 mg/dL — ABNORMAL HIGH (ref 0.61–1.24)
GFR, Estimated: 34 mL/min — ABNORMAL LOW (ref >60)
Glucose, Bld: 103 mg/dL — ABNORMAL HIGH (ref 70–99)
Potassium: 3.1 mmol/L — ABNORMAL LOW (ref 3.5–5.1)
Sodium: 139 mmol/L (ref 135–145)

## 2024-08-24 LAB — BRAIN NATRIURETIC PEPTIDE: B Natriuretic Peptide: 617.6 pg/mL — ABNORMAL HIGH (ref 0.0–100.0)

## 2024-08-24 LAB — LACTIC ACID, PLASMA: Lactic Acid, Venous: 1.9 mmol/L (ref 0.5–1.9)

## 2024-08-24 MED ORDER — FUROSCIX 80 MG/10ML ~~LOC~~ CTKT
80.0000 mg | CARTRIDGE | Freq: Every day | SUBCUTANEOUS | 2 refills | Status: AC | PRN
  Filled 2024-08-24 – 2024-08-25 (×4): qty 5, 5d supply, fill #0

## 2024-08-24 MED ORDER — EMPAGLIFLOZIN 10 MG PO TABS: 10.0000 mg | ORAL_TABLET | Freq: Every day | ORAL | 3 refills | Status: AC

## 2024-08-24 NOTE — Telephone Encounter (Signed)
 Patient Advocate Encounter   Received notification from [employer] that prior authorization for Furoscix  is required.   PA submitted on CoverMyMeds Key [id number] Status is pending   Will continue to follow.

## 2024-08-24 NOTE — Addendum Note (Signed)
 Encounter addended by: Cathern Andriette DEL, LCSW on: 08/24/2024 4:37 PM  Actions taken: Clinical Note Signed

## 2024-08-24 NOTE — Progress Notes (Signed)
 H&V Care Navigation CSW Progress Note  Clinical Social Worker met with pt and wife in room to discuss additional community resources for support.  Pt with progressing dementia and some safety concerns at home.  Goes to a senior center while wife is at work but not taking meds and not a lot of additional help available with needs.  Discussed [facility] program as possible alternative for daytime care- provided with information on how to contact so they can take a tour.  Wife reports no additional needs   SDOH Screenings   Food Insecurity: No Food Insecurity (06/11/2024)   Received from [facility]  Housing: Low Risk  (06/11/2024)   Received from [facility]  Transportation Needs: No Transportation Needs (06/11/2024)   Received from [facility]  Utilities: Not At Risk (06/11/2024)   Received from [facility]  Alcohol Screen: Low Risk  (09/15/2023)  Depression (PHQ2-9): Low Risk  (07/02/2024)  Financial Resource Strain: Medium Risk (02/19/2024)  Tobacco Use: Low Risk  (08/24/2024)  Health Literacy: Adequate Health Literacy (02/19/2024)   Andriette HILARIO Leech, LCSW Clinical Social Worker Advanced Heart Failure Clinic Desk#: [phone] Cell#: [phone]

## 2024-08-24 NOTE — Patient Instructions (Addendum)
 Re-start Jardiance  10 mg daily - Rx sent. Furoscix  80 mg daily x 3 days (Wednesday, Thursday, and Friday) along with potassium 40 meq daily. HOLD YOUR TORSEMIDE  ON THESE THREE DAYS. After you complete the Furoscix  return to taking Torsemide  80 mg twice daily along with potassium 60 meq twice daily. Labs today - will call you if abnormal. Return to Heart Failure APP Clinic in one week - see below. Please call us  at [phone] if any issues or concerns prior to your next visit.    Your provider has order Furoscix  for you. This is an on-body infuser that gives you a dose of Furosemide .   It will be shipped to your home from [facility], they will call you before shipping  Ensure you write down the time you start your infusion so that if there is a problem you will know how long the infusion lasted  Use Furoscix  only AS DIRECTED by our office  Dosing Directions:   Day 1= 80 mg  Day 2= 80 mg  Day 3= 80 mg

## 2024-08-24 NOTE — Progress Notes (Addendum)
 Provided patient education on Furoscix  using demo kits and Furoscix  video, QR code provided on AVS for further viewing. Furoscix  order submitted online, ov note and ins card uploaded to Furoscix  Direct.   Provided patient with samples of Furoscix :  [id number] Exp: 11/21/25 # 2 samples

## 2024-08-25 ENCOUNTER — Other Ambulatory Visit (HOSPITAL_COMMUNITY): Payer: Self-pay

## 2024-08-25 ENCOUNTER — Other Ambulatory Visit: Payer: Self-pay

## 2024-08-25 ENCOUNTER — Telehealth (HOSPITAL_COMMUNITY): Payer: Self-pay | Admitting: Pharmacy Technician

## 2024-08-25 ENCOUNTER — Ambulatory Visit (HOSPITAL_COMMUNITY): Payer: Self-pay | Admitting: Family Medicine

## 2024-08-25 NOTE — Telephone Encounter (Addendum)
 Advanced Heart Failure Patient Advocate Encounter  Prior Authorization for Furoscix  has been approved. Of note, patient is insured through [facility].  PA# [id number] Effective dates: 08/25/24 through 08/25/25  Patients co-pay is $1,465 (5 kits)  Connor JULIANNA Pa, CPhT

## 2024-08-25 NOTE — Telephone Encounter (Signed)
 Advanced Heart Failure Patient Advocate Encounter  Left patient message regarding possible grant assistance. Patient has deductible left to meet. Would like to help assist in cost of Eliquis  and Jardiance . The grant would help meet the patients deductible, hopefully dropping the cost of Furoscix .  Almarie JULIANNA Pa, CPhT

## 2024-08-26 ENCOUNTER — Inpatient Hospital Stay (HOSPITAL_COMMUNITY)

## 2024-08-26 ENCOUNTER — Emergency Department (HOSPITAL_COMMUNITY)

## 2024-08-26 ENCOUNTER — Encounter (HOSPITAL_COMMUNITY): Payer: Self-pay

## 2024-08-26 ENCOUNTER — Inpatient Hospital Stay (HOSPITAL_COMMUNITY)
Admission: EM | Admit: 2024-08-26 | Discharge: 2024-09-01 | DRG: 291 | Disposition: A | Discharge status: Home Health | Attending: Family Medicine | Admitting: Family Medicine

## 2024-08-26 DIAGNOSIS — R601 Generalized edema: Secondary | ICD-10-CM | POA: Diagnosis not present

## 2024-08-26 DIAGNOSIS — Z91148 Patient's other noncompliance with medication regimen for other reason: Secondary | ICD-10-CM | POA: Diagnosis not present

## 2024-08-26 DIAGNOSIS — Z8249 Family history of ischemic heart disease and other diseases of the circulatory system: Secondary | ICD-10-CM

## 2024-08-26 DIAGNOSIS — I252 Old myocardial infarction: Secondary | ICD-10-CM

## 2024-08-26 DIAGNOSIS — I509 Heart failure, unspecified: Secondary | ICD-10-CM | POA: Diagnosis not present

## 2024-08-26 DIAGNOSIS — N1832 Chronic kidney disease, stage 3b: Secondary | ICD-10-CM | POA: Diagnosis not present

## 2024-08-26 DIAGNOSIS — I83228 Varicose veins of left lower extremity with both ulcer of other part of lower extremity and inflammation: Secondary | ICD-10-CM | POA: Diagnosis not present

## 2024-08-26 DIAGNOSIS — Z7984 Long term (current) use of oral hypoglycemic drugs: Secondary | ICD-10-CM

## 2024-08-26 DIAGNOSIS — R0602 Shortness of breath: Secondary | ICD-10-CM | POA: Diagnosis not present

## 2024-08-26 DIAGNOSIS — Z9049 Acquired absence of other specified parts of digestive tract: Secondary | ICD-10-CM

## 2024-08-26 DIAGNOSIS — E66812 Obesity, class 2: Secondary | ICD-10-CM | POA: Diagnosis not present

## 2024-08-26 DIAGNOSIS — Z818 Family history of other mental and behavioral disorders: Secondary | ICD-10-CM

## 2024-08-26 DIAGNOSIS — R7989 Other specified abnormal findings of blood chemistry: Secondary | ICD-10-CM

## 2024-08-26 DIAGNOSIS — I83218 Varicose veins of right lower extremity with both ulcer of other part of lower extremity and inflammation: Secondary | ICD-10-CM | POA: Diagnosis not present

## 2024-08-26 DIAGNOSIS — F03A0 Unspecified dementia, mild, without behavioral disturbance, psychotic disturbance, mood disturbance, and anxiety: Secondary | ICD-10-CM | POA: Diagnosis present

## 2024-08-26 DIAGNOSIS — I482 Chronic atrial fibrillation, unspecified: Secondary | ICD-10-CM | POA: Diagnosis not present

## 2024-08-26 DIAGNOSIS — Z95810 Presence of automatic (implantable) cardiac defibrillator: Secondary | ICD-10-CM

## 2024-08-26 DIAGNOSIS — I5043 Acute on chronic combined systolic (congestive) and diastolic (congestive) heart failure: Principal | ICD-10-CM | POA: Diagnosis present

## 2024-08-26 DIAGNOSIS — I130 Hypertensive heart and chronic kidney disease with heart failure and stage 1 through stage 4 chronic kidney disease, or unspecified chronic kidney disease: Principal | ICD-10-CM | POA: Diagnosis present

## 2024-08-26 DIAGNOSIS — I2510 Atherosclerotic heart disease of native coronary artery without angina pectoris: Secondary | ICD-10-CM | POA: Diagnosis present

## 2024-08-26 DIAGNOSIS — Z7901 Long term (current) use of anticoagulants: Secondary | ICD-10-CM | POA: Diagnosis not present

## 2024-08-26 DIAGNOSIS — Z6834 Body mass index (BMI) 34.0-34.9, adult: Secondary | ICD-10-CM

## 2024-08-26 DIAGNOSIS — Z515 Encounter for palliative care: Secondary | ICD-10-CM | POA: Diagnosis not present

## 2024-08-26 DIAGNOSIS — Z789 Other specified health status: Secondary | ICD-10-CM

## 2024-08-26 DIAGNOSIS — I2489 Other forms of acute ischemic heart disease: Secondary | ICD-10-CM | POA: Diagnosis not present

## 2024-08-26 DIAGNOSIS — N184 Chronic kidney disease, stage 4 (severe): Secondary | ICD-10-CM | POA: Diagnosis not present

## 2024-08-26 DIAGNOSIS — Z83438 Family history of other disorder of lipoprotein metabolism and other lipidemia: Secondary | ICD-10-CM | POA: Diagnosis not present

## 2024-08-26 DIAGNOSIS — S81802A Unspecified open wound, left lower leg, initial encounter: Secondary | ICD-10-CM | POA: Diagnosis present

## 2024-08-26 DIAGNOSIS — N189 Chronic kidney disease, unspecified: Secondary | ICD-10-CM | POA: Diagnosis not present

## 2024-08-26 DIAGNOSIS — I472 Ventricular tachycardia, unspecified: Secondary | ICD-10-CM | POA: Diagnosis not present

## 2024-08-26 DIAGNOSIS — E876 Hypokalemia: Secondary | ICD-10-CM | POA: Diagnosis present

## 2024-08-26 DIAGNOSIS — I5023 Acute on chronic systolic (congestive) heart failure: Secondary | ICD-10-CM

## 2024-08-26 DIAGNOSIS — Z79899 Other long term (current) drug therapy: Secondary | ICD-10-CM | POA: Diagnosis not present

## 2024-08-26 DIAGNOSIS — E6609 Other obesity due to excess calories: Secondary | ICD-10-CM

## 2024-08-26 DIAGNOSIS — J90 Pleural effusion, not elsewhere classified: Secondary | ICD-10-CM

## 2024-08-26 DIAGNOSIS — Z8673 Personal history of transient ischemic attack (TIA), and cerebral infarction without residual deficits: Secondary | ICD-10-CM

## 2024-08-26 DIAGNOSIS — Z6837 Body mass index (BMI) 37.0-37.9, adult: Secondary | ICD-10-CM

## 2024-08-26 DIAGNOSIS — E785 Hyperlipidemia, unspecified: Secondary | ICD-10-CM | POA: Diagnosis not present

## 2024-08-26 DIAGNOSIS — Z951 Presence of aortocoronary bypass graft: Secondary | ICD-10-CM

## 2024-08-26 DIAGNOSIS — I4821 Permanent atrial fibrillation: Secondary | ICD-10-CM | POA: Diagnosis not present

## 2024-08-26 DIAGNOSIS — Z888 Allergy status to other drugs, medicaments and biological substances status: Secondary | ICD-10-CM

## 2024-08-26 DIAGNOSIS — I4891 Unspecified atrial fibrillation: Secondary | ICD-10-CM | POA: Diagnosis not present

## 2024-08-26 DIAGNOSIS — I5021 Acute systolic (congestive) heart failure: Secondary | ICD-10-CM

## 2024-08-26 DIAGNOSIS — Z86718 Personal history of other venous thrombosis and embolism: Secondary | ICD-10-CM

## 2024-08-26 DIAGNOSIS — Z7189 Other specified counseling: Secondary | ICD-10-CM | POA: Diagnosis not present

## 2024-08-26 DIAGNOSIS — R0682 Tachypnea, not elsewhere classified: Secondary | ICD-10-CM | POA: Diagnosis present

## 2024-08-26 DIAGNOSIS — E1122 Type 2 diabetes mellitus with diabetic chronic kidney disease: Secondary | ICD-10-CM | POA: Diagnosis not present

## 2024-08-26 DIAGNOSIS — I4819 Other persistent atrial fibrillation: Secondary | ICD-10-CM | POA: Diagnosis not present

## 2024-08-26 DIAGNOSIS — S81809A Unspecified open wound, unspecified lower leg, initial encounter: Secondary | ICD-10-CM | POA: Diagnosis present

## 2024-08-26 DIAGNOSIS — I10 Essential (primary) hypertension: Secondary | ICD-10-CM | POA: Diagnosis present

## 2024-08-26 DIAGNOSIS — R0989 Other specified symptoms and signs involving the circulatory and respiratory systems: Secondary | ICD-10-CM | POA: Diagnosis present

## 2024-08-26 DIAGNOSIS — I5020 Unspecified systolic (congestive) heart failure: Secondary | ICD-10-CM | POA: Diagnosis not present

## 2024-08-26 DIAGNOSIS — I493 Ventricular premature depolarization: Secondary | ICD-10-CM | POA: Diagnosis present

## 2024-08-26 LAB — I-STAT CHEM 8, ED
BUN: 29 mg/dL — ABNORMAL HIGH (ref 8–23)
Calcium, Ion: 1.02 mmol/L — ABNORMAL LOW (ref 1.15–1.40)
Chloride: 101 mmol/L (ref 98–111)
Creatinine, Ser: 2.4 mg/dL — ABNORMAL HIGH (ref 0.61–1.24)
Glucose, Bld: 84 mg/dL (ref 70–99)
HCT: 38 % — ABNORMAL LOW (ref 39.0–52.0)
Hemoglobin: 12.9 g/dL — ABNORMAL LOW (ref 13.0–17.0)
Potassium: 3.2 mmol/L — ABNORMAL LOW (ref 3.5–5.1)
Sodium: 139 mmol/L (ref 135–145)
TCO2: 25 mmol/L (ref 22–32)

## 2024-08-26 LAB — CBC
HCT: 35.8 % — ABNORMAL LOW (ref 39.0–52.0)
Hemoglobin: 11.8 g/dL — ABNORMAL LOW (ref 13.0–17.0)
MCH: 28.2 pg (ref 26.0–34.0)
MCHC: 33 g/dL (ref 30.0–36.0)
MCV: 85.6 fL (ref 80.0–100.0)
Platelets: 158 K/uL (ref 150–400)
RBC: 4.18 MIL/uL — ABNORMAL LOW (ref 4.22–5.81)
RDW: 16.9 % — ABNORMAL HIGH (ref 11.5–15.5)
WBC: 5.5 K/uL (ref 4.0–10.5)
nRBC: 0 % (ref 0.0–0.2)

## 2024-08-26 LAB — MAGNESIUM: Magnesium: 1.8 mg/dL (ref 1.7–2.4)

## 2024-08-26 LAB — ECHOCARDIOGRAM COMPLETE: S' Lateral: 3.5 cm

## 2024-08-26 LAB — BASIC METABOLIC PANEL WITH GFR
Anion gap: 18 — ABNORMAL HIGH (ref 5–15)
BUN: 29 mg/dL — ABNORMAL HIGH (ref 8–23)
CO2: 25 mmol/L (ref 22–32)
Calcium: 9.2 mg/dL (ref 8.9–10.3)
Chloride: 99 mmol/L (ref 98–111)
Creatinine, Ser: 2.28 mg/dL — ABNORMAL HIGH (ref 0.61–1.24)
GFR, Estimated: 30 mL/min — ABNORMAL LOW (ref >60)
Glucose, Bld: 88 mg/dL (ref 70–99)
Potassium: 3.4 mmol/L — ABNORMAL LOW (ref 3.5–5.1)
Sodium: 142 mmol/L (ref 135–145)

## 2024-08-26 LAB — HEPATIC FUNCTION PANEL
ALT: 19 U/L (ref 0–44)
AST: 47 U/L — ABNORMAL HIGH (ref 15–41)
Albumin: 3 g/dL — ABNORMAL LOW (ref 3.5–5.0)
Alkaline Phosphatase: 187 U/L — ABNORMAL HIGH (ref 38–126)
Bilirubin, Direct: 4.2 mg/dL — ABNORMAL HIGH (ref 0.0–0.2)
Indirect Bilirubin: 4.5 mg/dL — ABNORMAL HIGH (ref 0.3–0.9)
Total Bilirubin: 8.7 mg/dL — ABNORMAL HIGH (ref 0.0–1.2)
Total Protein: 7 g/dL (ref 6.5–8.1)

## 2024-08-26 LAB — TROPONIN I (HIGH SENSITIVITY)
Troponin I (High Sensitivity): 46 ng/L — ABNORMAL HIGH (ref <18)
Troponin I (High Sensitivity): 41 ng/L — ABNORMAL HIGH (ref <18)

## 2024-08-26 LAB — BRAIN NATRIURETIC PEPTIDE: B Natriuretic Peptide: 556.3 pg/mL — ABNORMAL HIGH (ref 0.0–100.0)

## 2024-08-26 MED ORDER — RISPERIDONE 0.5 MG PO TABS
0.5000 mg | ORAL_TABLET | Freq: Every day | ORAL | Status: DC
  Administered 2024-08-27 – 2024-08-31 (×5): 0.5 mg via ORAL
  Filled 2024-08-26 (×6): qty 1

## 2024-08-26 MED ORDER — APIXABAN 5 MG PO TABS
5.0000 mg | ORAL_TABLET | Freq: Two times a day (BID) | ORAL | Status: DC
  Administered 2024-08-26 – 2024-09-01 (×12): 5 mg via ORAL
  Filled 2024-08-26 (×12): qty 1

## 2024-08-26 MED ORDER — MELATONIN 3 MG PO TABS
3.0000 mg | ORAL_TABLET | Freq: Every day | ORAL | Status: DC
  Administered 2024-08-26 – 2024-08-31 (×6): 3 mg via ORAL
  Filled 2024-08-26 (×6): qty 1

## 2024-08-26 MED ORDER — FUROSEMIDE 10 MG/ML IJ SOLN
80.0000 mg | Freq: Two times a day (BID) | INTRAMUSCULAR | Status: DC
  Administered 2024-08-26 – 2024-08-28 (×5): 80 mg via INTRAVENOUS
  Filled 2024-08-26 (×5): qty 8

## 2024-08-26 MED ORDER — SODIUM CHLORIDE 0.9% FLUSH: 3.0000 mL | INTRAVENOUS | Status: DC | PRN

## 2024-08-26 MED ORDER — POTASSIUM CHLORIDE 20 MEQ PO PACK
40.0000 meq | PACK | Freq: Once | ORAL | Status: AC
  Administered 2024-08-26: 40 meq via ORAL
  Filled 2024-08-26: qty 2

## 2024-08-26 MED ORDER — MAGNESIUM SULFATE 2 GM/50ML IV SOLN
2.0000 g | Freq: Once | INTRAVENOUS | Status: AC
  Administered 2024-08-26: 2 g via INTRAVENOUS
  Filled 2024-08-26: qty 50

## 2024-08-26 MED ORDER — CARVEDILOL 3.125 MG PO TABS
3.1250 mg | ORAL_TABLET | Freq: Two times a day (BID) | ORAL | Status: DC
  Administered 2024-08-26 – 2024-09-01 (×9): 3.125 mg via ORAL
  Filled 2024-08-26 (×12): qty 1

## 2024-08-26 MED ORDER — ACETAMINOPHEN 325 MG PO TABS
650.0000 mg | ORAL_TABLET | ORAL | Status: DC | PRN
  Administered 2024-08-30 – 2024-08-31 (×2): 650 mg via ORAL
  Filled 2024-08-26 (×2): qty 2

## 2024-08-26 MED ORDER — SODIUM CHLORIDE 0.9% FLUSH
3.0000 mL | Freq: Two times a day (BID) | INTRAVENOUS | Status: DC
  Administered 2024-08-26 – 2024-08-31 (×12): 3 mL via INTRAVENOUS

## 2024-08-26 MED ORDER — EMPAGLIFLOZIN 10 MG PO TABS
10.0000 mg | ORAL_TABLET | Freq: Every day | ORAL | Status: DC
  Administered 2024-08-26 – 2024-09-01 (×7): 10 mg via ORAL
  Filled 2024-08-26 (×7): qty 1

## 2024-08-26 MED ORDER — SILVER SULFADIAZINE 1 % EX CREA
1.0000 | TOPICAL_CREAM | Freq: Two times a day (BID) | CUTANEOUS | Status: DC
  Administered 2024-08-29 – 2024-08-31 (×5): 1 via TOPICAL
  Filled 2024-08-26: qty 85

## 2024-08-26 MED ORDER — ATORVASTATIN CALCIUM 10 MG PO TABS
20.0000 mg | ORAL_TABLET | Freq: Every day | ORAL | Status: DC
  Administered 2024-08-26 – 2024-09-01 (×7): 20 mg via ORAL
  Filled 2024-08-26 (×7): qty 2

## 2024-08-26 MED ORDER — VITAMIN B-12 1000 MCG PO TABS
1000.0000 ug | ORAL_TABLET | Freq: Every day | ORAL | Status: DC
  Administered 2024-08-26 – 2024-09-01 (×7): 1000 ug via ORAL
  Filled 2024-08-26 (×7): qty 1

## 2024-08-26 MED ORDER — RISPERIDONE 0.5 MG PO TABS
0.5000 mg | ORAL_TABLET | Freq: Every day | ORAL | Status: DC
  Administered 2024-08-26: 0.5 mg via ORAL
  Filled 2024-08-26: qty 1

## 2024-08-26 MED ORDER — FUROSEMIDE 10 MG/ML IJ SOLN
80.0000 mg | Freq: Once | INTRAMUSCULAR | Status: AC
  Administered 2024-08-26: 80 mg via INTRAVENOUS
  Filled 2024-08-26: qty 8

## 2024-08-26 MED ORDER — DONEPEZIL HCL 10 MG PO TABS
10.0000 mg | ORAL_TABLET | Freq: Every day | ORAL | Status: DC
  Administered 2024-08-26 – 2024-09-01 (×7): 10 mg via ORAL
  Filled 2024-08-26 (×7): qty 1

## 2024-08-26 MED ORDER — SODIUM CHLORIDE 0.9 % IV SOLN: 250.0000 mL | INTRAVENOUS | Status: AC | PRN

## 2024-08-26 NOTE — Consult Note (Signed)
 Please note that the [facility] nursing team is utilizing a standardized work plan to manage patient consults. We are triaging consults and will try to see the patients within 48 hours. Wound photos in the patient's chart allow [place] to consult on the patient in the most efficient and timely manner.

## 2024-08-26 NOTE — Plan of Care (Signed)
 FMTS Interim Progress Note  S: Patient seen sitting in bed eating graham crackers. Denies any chest pain at this time. States his breathing is improving. Denies any other pain or complaints at this time. Discussed palliative care with patient, explained that they would help coordinate patient's goals of care moving forward.   O: BP 111/67 (BP Location: Left Arm)   Pulse 81   Temp 97.9 F (36.6 C) (Oral)   Resp (!) 21   Wt (!) 136.7 kg   SpO2 100%   BMI 38.69 kg/m   General: NAD Cardiovascular: RRR, no m/r/g Respiratory: CTAB, normal work of breathing on room air Abdomen: protuberant, soft, non-tender to palpation Extremities: bilateral lower extremities wrapped, 1+ pitting edema bilaterally proximal to lower extremity leg wrappings  A/P: Acute on chronic combined systolic and diastolic CHF Echo showed LVEF of 30 to 35% with moderately decreased function, mild concentric LV hypertrophy.  RV normal size and function.  LA mildly dilated.  RA normal size.  Mitral and tricuspid valve with trivial regurgitation.  Moderate aortic valve calcification. - Increased Lasix  to 80 mg IV twice daily - BMP and mag ordered  Hypokalemia - BMP ordered  Multiple open wounds of lower extremity Pictures in chart.  Bilateral lower extremities wrapped.  Rest of plan per day team.   Lennie Raguel MATSU, DO 08/26/2024, 10:26 PM PGY-1, [facility] Family Medicine Service pager [phone]

## 2024-08-26 NOTE — ED Provider Notes (Signed)
 [place] EMERGENCY DEPARTMENT AT [facility] Provider Note   CSN: [id number] Arrival date & time: 08/26/24  9449     Patient presents with: Shortness of Breath   Royden Bulman. is a 68 y.o. male.   Pt with hx chf, presents with increased bilateral leg and whole body swelling, sob, orthopnea, pnd. Hx non compliance w home meds and with heart healthy eating plan.  No cough or fevers. No chest pain. No focal or unilateral leg pain or swelling. Unspecific weight increase. Is eating/drinking well.   The history is provided by medical records, the spouse, the patient and a relative. The history is limited by the condition of the patient.  Shortness of Breath Associated symptoms: no abdominal pain, no chest pain, no cough, no fever, no headaches, no neck pain, no sore throat and no vomiting        Prior to Admission medications   Medication Sig Start Date End Date Taking? Authorizing Provider  apixaban  (ELIQUIS ) 5 MG TABS tablet Take 1 tablet (5 mg total) by mouth 2 (two) times daily. 07/02/24   Petrina Pries, NP  atorvastatin  (LIPITOR) 20 MG tablet Take 1 tablet (20 mg total) by mouth daily. 09/22/23   Danton Reyes DASEN, MD  carvedilol  (COREG ) 3.125 MG tablet Take 1 tablet (3.125 mg total) by mouth 2 (two) times daily. 09/22/23   Danton Reyes DASEN, MD  ciclopirox  (PENLAC ) 8 % solution Apply topically at bedtime. Apply over nail and surrounding skin. Apply daily over previous coat. After seven (7) days, may remove with alcohol and continue cycle. Patient not taking: Reported on 08/24/2024 08/03/24   Lamount Ethan CROME, DPM  cyanocobalamin  (VITAMIN B12) 1000 MCG tablet Take 1 tablet (1,000 mcg total) by mouth daily. 03/03/23   Georgina Speaks, FNP  donepezil  (ARICEPT ) 10 MG tablet Take 1 tablet (10 mg total) by mouth daily. 07/02/24   Petrina Pries, NP  empagliflozin  (JARDIANCE ) 10 MG TABS tablet Take 1 tablet (10 mg total) by mouth daily. 08/24/24   Milford, Harlene HERO, FNP  Furosemide   (FUROSCIX ) 80 MG/10ML CTKT Inject 80 mg into the skin daily as needed. 08/24/24   Glena Harlene HERO, FNP  isosorbide  mononitrate (IMDUR ) 30 MG 24 hr tablet Take 30 mg by mouth daily. Patient is taking 1/2 tablet daily per instructions on his prescription bottle    [provider]  potassium chloride  SA (KLOR-CON  M) 20 MEQ tablet Take 3 tablets (60 mEq total) by mouth 2 (two) times daily. 07/28/24   Milford, Harlene HERO, FNP  risperiDONE  (RISPERDAL ) 0.5 MG tablet Take 1 tablet (0.5 mg total) by mouth daily. 07/14/24   Moore, Janece, FNP  silver  sulfADIAZINE  (SILVADENE ) 1 % cream Apply 1 Application topically 2 (two) times daily. Clean site of wound and apply cream. Patient not taking: Reported on 08/24/2024 02/03/24   Arloa Suzen RAMAN, NP  SV IRON 325 (65 Fe) MG tablet Take 325 mg by mouth 3 (three) times a week. 08/06/23   [provider]  torsemide  (DEMADEX ) 20 MG tablet Take 4 tablets (80 mg total) by mouth 2 (two) times daily. 07/28/24   Glena Harlene HERO, FNP    Allergies: Clopidogrel and Plavix [clopidogrel bisulfate]    Review of Systems  Constitutional:  Negative for chills and fever.  HENT:  Negative for sore throat.   Respiratory:  Positive for shortness of breath. Negative for cough.   Cardiovascular:  Positive for leg swelling. Negative for chest pain and palpitations.  Gastrointestinal:  Negative  for abdominal pain, diarrhea and vomiting.  Genitourinary:  Negative for dysuria and flank pain.  Musculoskeletal:  Negative for back pain and neck pain.  Neurological:  Negative for headaches.    Updated Vital Signs BP 111/82   Pulse 75   Temp 98.4 F (36.9 C) (Oral)   Resp (!) 21   SpO2 100%   Physical Exam Vitals and nursing note reviewed.  Constitutional:      Appearance: Normal appearance. He is well-developed.  HENT:     Head: Atraumatic.     Nose: Nose normal.     Mouth/Throat:     Mouth: Mucous membranes are moist.     Pharynx: Oropharynx is clear.  Eyes:      General: No scleral icterus.    Conjunctiva/sclera: Conjunctivae normal.  Neck:     Trachea: No tracheal deviation.     Comments: Trachea midline. Thyroid not grossly enlarged or tender. +JVD. Cardiovascular:     Rate and Rhythm: Normal rate and regular rhythm.     Pulses: Normal pulses.     Heart sounds: Normal heart sounds. No murmur heard.    No friction rub. No gallop.  Pulmonary:     Effort: Pulmonary effort is normal. No accessory muscle usage or respiratory distress.     Breath sounds: Normal breath sounds.  Abdominal:     General: Bowel sounds are normal. There is no distension.     Palpations: Abdomen is soft.     Tenderness: There is no abdominal tenderness.  Genitourinary:    Comments: No cva tenderness. Scrotal edema, no scrotal cellulitis/infection.  Musculoskeletal:     Cervical back: Normal range of motion and neck supple. No rigidity.     Comments: Mod-severe bil leg edema, symmetric extending to abdomen.   Skin:    General: Skin is warm and dry.     Findings: No rash.  Neurological:     Mental Status: He is alert.     Comments: Alert, speech clear. Motor/sens grossly intact bil.  Psychiatric:        Mood and Affect: Mood normal.     (all labs ordered are listed, but only abnormal results are displayed) Results for orders placed or performed during the hospital encounter of 08/26/24  Basic metabolic panel   Collection Time: 08/26/24  6:07 AM  Result Value Ref Range   Sodium 142 135 - 145 mmol/L   Potassium 3.4 (L) 3.5 - 5.1 mmol/L   Chloride 99 98 - 111 mmol/L   CO2 25 22 - 32 mmol/L   Glucose, Bld 88 70 - 99 mg/dL   BUN 29 (H) 8 - 23 mg/dL   Creatinine, Ser 7.71 (H) 0.61 - 1.24 mg/dL   Calcium  9.2 8.9 - 10.3 mg/dL   GFR, Estimated 30 (L) >60 mL/min   Anion gap 18 (H) 5 - 15  CBC   Collection Time: 08/26/24  6:07 AM  Result Value Ref Range   WBC 5.5 4.0 - 10.5 K/uL   RBC 4.18 (L) 4.22 - 5.81 MIL/uL   Hemoglobin 11.8 (L) 13.0 - 17.0 g/dL   HCT  64.1 (L) 60.9 - 52.0 %   MCV 85.6 80.0 - 100.0 fL   MCH 28.2 26.0 - 34.0 pg   MCHC 33.0 30.0 - 36.0 g/dL   RDW 83.0 (H) 88.4 - 84.4 %   Platelets 158 150 - 400 K/uL   nRBC 0.0 0.0 - 0.2 %  BNP (Order if Patient has history of Heart Failure)  Collection Time: 08/26/24  6:07 AM  Result Value Ref Range   B Natriuretic Peptide 556.3 (H) 0.0 - 100.0 pg/mL  Troponin I (High Sensitivity)   Collection Time: 08/26/24  6:07 AM  Result Value Ref Range   Troponin I (High Sensitivity) 46 (H) <18 ng/L  I-stat chem 8, ED (not at [facility], [facility] or [facility])   Collection Time: 08/26/24  6:11 AM  Result Value Ref Range   Sodium 139 135 - 145 mmol/L   Potassium 3.2 (L) 3.5 - 5.1 mmol/L   Chloride 101 98 - 111 mmol/L   BUN 29 (H) 8 - 23 mg/dL   Creatinine, Ser 7.59 (H) 0.61 - 1.24 mg/dL   Glucose, Bld 84 70 - 99 mg/dL   Calcium , Ion 1.02 (L) 1.15 - 1.40 mmol/L   TCO2 25 22 - 32 mmol/L   Hemoglobin 12.9 (L) 13.0 - 17.0 g/dL   HCT 61.9 (L) 60.9 - 47.9 %   DG Chest 2 View Result Date: 08/26/2024 CLINICAL DATA:  Shortness of breath, increasing fatigue, and lower extremity swelling. EXAM: CHEST - 2 VIEW COMPARISON:  PA Lat chest 09/12/2023 FINDINGS: There are continued small-to-moderate layering pleural effusions, slightly more so on the right. There is overlying atelectasis or airspace disease in the lower lung fields, more noticeable on the right due to the elevated hemidiaphragm. The left mid and both upper lung fields remain clear. Left chest dual lead pacing system and wire insertions are unaltered. There are intact sternotomy sutures with old CABG change, mild cardiomegaly. No vascular congestion is seen. The mediastinum is stable. Aortic atherosclerosis. No new osseous findings.  Mild thoracic spondylosis. IMPRESSION: 1. Continued small-to-moderate layering pleural effusions, slightly more so on the right. 2. Overlying atelectasis or airspace disease in the lower lung fields, more noticeable on the right due to  the elevated hemidiaphragm. Overall aeration seems unchanged from 1 year ago. 3. Stable cardiomegaly. No vascular congestion. Electronically Signed   By: Francis Quam M.D.   On: 08/26/2024 06:44   CUP PACEART REMOTE DEVICE CHECK Result Date: 07/30/2024 ICD Scheduled remote reviewed. Normal device function.  Presenting rhythm:  AF, VS Known AF, overall controlled rates, Eliquis  per PA report HF diagnostics currently abnormal Next remote 91 days. LA, CVRS    EKG: EKG Interpretation Date/Time:  Thursday August 26 2024 06:11:30 EDT Ventricular Rate:  77 PR Interval:    QRS Duration:  94 QT Interval:  408 QTC Calculation: 461 R Axis:   43  Text Interpretation: Atrial fibrillation Premature ventricular complexes Non-specific ST-t changes Confirmed by Bernard Drivers ([id number]) on 08/26/2024 8:11:56 AM  Radiology: [facility] Chest 2 View Result Date: 08/26/2024 CLINICAL DATA:  Shortness of breath, increasing fatigue, and lower extremity swelling. EXAM: CHEST - 2 VIEW COMPARISON:  PA Lat chest 09/12/2023 FINDINGS: There are continued small-to-moderate layering pleural effusions, slightly more so on the right. There is overlying atelectasis or airspace disease in the lower lung fields, more noticeable on the right due to the elevated hemidiaphragm. The left mid and both upper lung fields remain clear. Left chest dual lead pacing system and wire insertions are unaltered. There are intact sternotomy sutures with old CABG change, mild cardiomegaly. No vascular congestion is seen. The mediastinum is stable. Aortic atherosclerosis. No new osseous findings.  Mild thoracic spondylosis. IMPRESSION: 1. Continued small-to-moderate layering pleural effusions, slightly more so on the right. 2. Overlying atelectasis or airspace disease in the lower lung fields, more noticeable on the right due to the elevated hemidiaphragm. Overall  aeration seems unchanged from 1 year ago. 3. Stable cardiomegaly. No vascular congestion.  Electronically Signed   By: Francis Quam M.D.   On: 08/26/2024 06:44     Procedures   Medications Ordered in the ED  potassium chloride  (KLOR-CON ) packet 40 mEq (has no administration in time range)  furosemide  (LASIX ) injection 80 mg (80 mg Intravenous Given 08/26/24 0752)                                    Medical Decision Making Problems Addressed: Acute on chronic combined systolic and diastolic CHF (congestive heart failure) (HCC): acute illness or injury with systemic symptoms that poses a threat to life or bodily functions Anasarca: acute illness or injury Chronic atrial fibrillation (HCC): chronic illness or injury that poses a threat to life or bodily functions Elevated brain natriuretic peptide (BNP) level: acute illness or injury Hypokalemia: acute illness or injury Non compliance w medication regimen: chronic illness or injury that poses a threat to life or bodily functions Pleural effusion: acute illness or injury Shortness of breath: acute illness or injury with systemic symptoms that poses a threat to life or bodily functions Stage 4 chronic kidney disease (HCC): chronic illness or injury with exacerbation, progression, or side effects of treatment that poses a threat to life or bodily functions  Amount and/or Complexity of Data Reviewed Independent Historian:     Details: Family, hx External Data Reviewed: notes. Labs: ordered. Decision-making details documented in ED Course. Radiology: ordered and independent interpretation performed. Decision-making details documented in ED Course. ECG/medicine tests: ordered and independent interpretation performed. Decision-making details documented in ED Course. Discussion of management or test interpretation with external provider(s): medicine  Risk Prescription drug management. Decision regarding hospitalization.   Iv ns. Continuous pulse ox and cardiac monitoring. Labs ordered/sent. Imaging ordered.   Differential  diagnosis includes chf, pna, acs, etc. Dispo decision including potential need for admission considered - will get labs and imaging and reassess.   Reviewed nursing notes and prior charts for additional history. External reports reviewed. Additional history from: family.  Cardiac monitor: sinus rhythm, rate 80.  Lasix  iv.   Labs reviewed/interpreted by me - wbc normal. Hgb 12. Ckd c/w baseline. K mildly low.   Xrays reviewed/interpreted by me - vasc cong/infil.   Medicine consulted for admission.   CRITICAL CARE RE Acute on chronic chf, dyspnea, stage IV CKD, anasarca Performed by: Anquan Azzarello E Niralya Ohanian Total critical care time: 40 minutes Critical care time was exclusive of separately billable procedures and treating other patients. Critical care was necessary to treat or prevent imminent or life-threatening deterioration. Critical care was time spent personally by me on the following activities: development of treatment plan with patient and/or surrogate as well as nursing, discussions with consultants, evaluation of patient's response to treatment, examination of patient, obtaining history from patient or surrogate, ordering and performing treatments and interventions, ordering and review of laboratory studies, ordering and review of radiographic studies, pulse oximetry and re-evaluation of patient's condition.       Final diagnoses:  None    ED Discharge Orders     None          Bernard Drivers, MD 08/26/24 [phone]

## 2024-08-26 NOTE — Progress Notes (Signed)
 Heart Failure Navigator Progress Note  Assessed for Heart & Vascular TOC clinic readiness.  Patient does not meet criteria due to per notes patient with history of Dementia. No HF TOC. .   Navigator will sign off at this time.   Stephane Haddock, BSN, Scientist, clinical (histocompatibility and immunogenetics) Only

## 2024-08-26 NOTE — H&P (Signed)
 Hospital Admission History and Physical Service Pager: [phone]  Patient name: Connor Burns. Medical record number: [id number] Date of Birth: 1956-06-16 Age: 68 y.o. Gender: male  Primary Care Provider: Gaines Burns Consultants: cardiology, wound care  Code Status: Full - confirmed with patient's daughter  Preferred Emergency Contact:  Contact Information     Name Relation Home Work Mobile   Connor Burns Spouse [phone]  [phone]   Connor Burns Daughter   [phone]      Other Contacts   None on File    Can call daughter if wife is busy.   Chief Complaint: shortness of breath and edema  Differential and Medical Decision Making:  Connor Xiang. is a 67 y.o. male presenting with shortness of breath and edema.  Differential for this patient's presentation of this includes acute on chronic heart failure exacerbation, ACS, PE. PE less likely as patient is not hypotensive or tachycardic. He does have lapses in medication administration so this is still a possibility. ACS less likely as troponin has not increased, EKG without signs of ACS, and patient denies chest pain. Most likely acute on chronic heart failure exacerbation due to non adherence with medication regimen due to worsening dementia. BNP is elevated, patient has worsening lower extremity edema and pleural effusions on CXR. Previous discharge dry weight from admission last year was 225 lb. He was about 10 kg less about one month ago.  Assessment & Plan Acute on chronic combined systolic and diastolic CHF (congestive heart failure) (HCC) S/p IV lasix  80 mg x1. Discussed option of palliative care with patient's daughter given dementia and HF Admit to FMTS, Attending Dr. Donah  - Cardiology consulted, appreciate recs  - Continue jardiance , coreg   - Heart healthy diet  - Cardiac telemetry - Strict ins and outs, daily weights   - AM BMP, Mag  - PT and OT starting tomorrow  Hypokalemia Due to chronic  diuresis and diet  - Repleted today  - F/u AM BMP and replete as necessary  Multiple open wounds of lower extremity Follows with wound care clinic out patient has multiple venous stasis ulcers  - WOC consulted  - continue silver  sulfadiazine  BID    Chronic and Stable Conditions: Afib: continue coreg , continue BID eliquis   Dementia: continue donepezil  and risperidone    FEN/GI: heart healthy diet  VTE Prophylaxis: On home eliquis    Disposition: pending discussion with family and PT/OT evaluation   History of Present Illness:  Connor Vane. is a 68 y.o. male presenting with shortness of breath and edema.   Spoke with patient's daughter on the phone as wife was unavailable. She says that patient uses [facility] and goes to an adult center during the day. However, he has been declining meds at the center. He had recently gone to a cardiology appt and was prescribed furoscix  injections but declined it at the center and was difficult with wife at home so she had not given it. Daughter is unsure what changed to make wife bring patient in today.  Daughter says this has happened multiple times before that patient has had to be admitted for diuresis due to similar situation.  Patient's dementia has also been worsening. Recently around father's day patient drove [facility] after being confused and had to be admitted in [place].   Patient denies chest pain or shortness of breath; however, is confused.   Wife works and manages meds at home, but does not have  much support. 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 HFSW discussed PACE with wife recently.   Recently seen in HF clinic on 08/24/2024 and was told to start furoscix  + 40 mg Kcl for 3 days and to hold torsemide  during this time. Plan had been to restart torsemide  80 mg BID with 60 mg Kcl BID after those 3 days. Patient was also supposed to restart jardiance  and continue coreg .   In the ED, patient is stable on room air. He was given IV  lasix  80 mg once with potassium repletion.   Review Of Systems: Per HPI   Pertinent Past Medical History: HFrEF w/ ICD  CAD with h/o MI/CABG in 2005 Persistent Afib on eliquis   H/o DVT  CKD stage 4  PVD with leg wounds  Remainder reviewed in history tab.   Pertinent Past Surgical History: CABG   Remainder reviewed in history tab.   Pertinent Social History: Unable to get history due to patient's dementia   Pertinent Family History: Hypertension, heart disease  .   Important Outpatient Medications: Furoscix  Torsemide   Eliquis   Imdur   Coreg   Jardiance   Atorvastatin   Donepezil   Risperidone     Objective: BP 109/73   Pulse 69   Temp 97.6 F (36.4 C)   Resp (!) 0   SpO2 100%  Exam: General: chronically ill appearing, in no acute distress  Eyes: no scleral icterus  Neck: mild JVD  Cardiovascular: irregular rhythm, normal rate, no M/R/G, cap refill < 2 seconds  Respiratory: Normal work of breathing on room air, CTAB, mildly diminished breath sounds at right base  Gastrointestinal: distended abdomen, soft, no tenderness to palpation  Derm: ulcerative wounds on bilateral lower extremities  Neuro: alert and oriented to self and partly to situation Psych: pleasant   Labs:  CBC BMET  Recent Labs  Lab 08/26/24 0607 08/26/24 0611  WBC 5.5  --   HGB 11.8* 12.9*  HCT 35.8* 38.0*  PLT 158  --    Recent Labs  Lab 08/26/24 0607 08/26/24 0611  NA 142 139  K 3.4* 3.2*  CL 99 101  CO2 25  --   BUN 29* 29*  CREATININE 2.28* 2.40*  GLUCOSE 88 84  CALCIUM  9.2  --     Pertinent additional labs troponin 41, BNP 556.  EKG: irregular rhythm consistent with Afib, ventricular rate 77, few PVC    Imaging Studies Performed:  CXR:  Impression from Radiologist:  1. Continued small-to-moderate layering pleural effusions, slightly more so on the right. 2. Overlying atelectasis or airspace disease in the lower lung fields, more noticeable on the right due to the  elevated hemidiaphragm. Overall aeration seems unchanged from 1 year ago. 3. Stable cardiomegaly. No vascular congestion.   My Interpretation:  mild- moderate pleural effusions bilaterally, elevated hemidiaphragm on right side, cardiomegaly   Nicholas Bar, MD 08/26/2024, 10:08 AM PGY-3, [facility]  FPTS Intern pager: [phone], text pages welcome Secure chat group [facility] Teaching Service

## 2024-08-26 NOTE — ED Notes (Signed)
 Pending inpt bed assignment, no changes, VSS. Report given to yellow holding area RN. Preparing to move to holding area/ yellow.

## 2024-08-26 NOTE — Plan of Care (Signed)

## 2024-08-26 NOTE — Progress Notes (Signed)
  Echocardiogram 2D Echocardiogram has been performed.  Tinnie FORBES Gosling RDCS 08/26/2024, 1:57 PM

## 2024-08-26 NOTE — Assessment & Plan Note (Signed)
 Due to chronic diuresis and diet  - Repleted today  - F/u AM BMP and replete as necessary

## 2024-08-26 NOTE — Hospital Course (Addendum)
 Connor Burley Jr. is a 68 y.o.male with a history of CAD, CHF secondary to ischemic cardiomyopathy, ICD placement, prior DVT, permanent A-fib, stage IV CKD, hyperlipidemia, dementia, prior stroke who was admitted to the [facility] Medicine Teaching Service at [facility] for acute on chronic heart failure exacerbation in the setting of medication non-adherence. His hospital course is detailed below:  Acute on chronic combined systolic and diastolic CHF Patient presented with acute dyspnea, anasarca, weight gain, increased fatigue for 3 weeks. Echocardiogram showed LVEF of 30-35%. He received IV diuretics with improvement in symptoms. Cardiology was consulted, they recommended ***. GDMT limited by CKD.   Hypokalemia Patient found to have a low K of 3.2 due to chronic diuresis. K was repleted as indicated. At time of discharge, K was ***  Multiple open wounds of lower extremity Patient found to have multiple venous stasis ulcers on admission. Wound/ostomy care consulted. Silver  sulfadiazine  applied twice daily to affected areas.  Other chronic conditions were medically managed with home medications and formulary alternatives as necessary (dementia, Afib, CAD, HLD, CKD stage IV)  PCP Follow-up Recommendations:

## 2024-08-26 NOTE — Consult Note (Signed)
 Cardiology Consultation   Patient ID: Connor Burns. MRN: [id number]; DOB: 11-28-1956  Admit date: 08/26/2024 Date of Consult: 08/26/2024  PCP:  Georgina Speaks, FNP   [facility] Providers Cardiologist:  Dub Huntsman, DO  Electrophysiologist:  Will Gladis Norton, MD       Patient Profile: Connor Burns. is a 68 y.o. male with a hx of chronic systolic heart failure, ischemic cardiomyopathy, CAD s/p CABG x 4 (TN), CKD 4, persistent atrial fibrillation despite ablation, history of VT with ICD in place, history of DVT on anticoagulation, obesity, PAD with chronic LE wounds who is being seen 08/26/2024 for the evaluation of acute systolic heart failure at the request of Dr Donah.  History of Present Illness:  Connor Burns was previously followed by Dr. Okey and then transition care to cardiology in [place].  He reestablished with Dr. Huntsman in 2024 after moving back to the area.  Per OSH records, heart catheterization 2015 suggested 2 occluded vein grafts, we do not have these records.  Echocardiogram October 2018 with an LVEF 40-44%, grade 2 DD, akinesis of inferior and inferoseptal myocardium.  LVEF in 2019 reported as 30-35%.  He underwent nuclear stress test March 2024 that showed a large area of infarction in the inferior wall without reversible ischemia.  This was discussed at his next visit and decision was made to defer heart catheterization due to poor renal function.  Hospitalization 08/2023 with an LVEF 35-40%, RWMA as above, mild LVH, mildly reduced RVEF, RVSP 40.2 mmHg, small pericardial effusion, mild MR.  He has recently followed with the advanced heart failure clinic.  Volume status has been difficult given medication nonadherence, dietary indiscretion, and advanced dementia.  GDMT has been limited by CKD, and borderline blood pressure.  He was admitted 08/2023 weighing 294 pounds and was 225 pounds at discharge.  He was apparently admitted June 2025 to OSH in [place]   with altered mental status.  He apparently attempted to drive his car from [place]  to [place] .  He was found by police at a gas station and was treated with IVF in the hospital.  He was eventually diuresed and GDMT restarted.  He was seen in advanced heart failure clinic 07/28/2024 with significant volume overload.  Torsemide  was increased to 80 mg twice daily with close follow-up.  He was seen back in the clinic 08/24/2024 and remained volume up.  OptiVol with persistent volume overload and weight gain.  Admission was discussed for IV diuresis, patient declined.  Furoscix  was discussed and started - given to wife but was not administered.   Unfortunately pt had worsening CHF and presented to [facility] ED on 08/26/2024 with increased fatigue, shortness of breath, and lower extremity swelling.  He received 80 mg IV Lasix .  Palliative care was initially discussed with the patient's family given dementia and advanced heart failure.  He also has multiple open wounds on his lower extremity.  Alk phos 187 AST 47 ALT 19 Albumin 3.0 Troponin 46->41 Magnesium  1.8 Creatinine 2.4, K 3.2, sodium 139 BNP 556 WBC 5.5 Hemoglobin 11.8  Cardiology was consulted for further recommendations.  Weight not yet recorded.  I was able to speak with is wife who helps with history. He was initially doing well after discharge in June, but has declined over the month of August due to not wanting to take medications at home. He does go to an adult day center and eats one meal there, but otherwise largely low sodium. He is unable to rest  flat. He ambulates at home and goes to the adult day center most days. They have not yet setup PT or home health RN.    Past Medical History:  Diagnosis Date   Acute osteomyelitis of ankle and foot 05/17/2014   Acute systolic heart failure (HCC) 09/12/2023   Arthritis 10/27/2020   Wrist   Atherosclerosis of native arteries of the extremities with ulceration 06/15/2014   Automatic  implantable cardioverter-defibrillator in situ 01/14/2017   veral times a day for 10 minutes each time; The patient was also instructed to hold his Xarelto  tonight and tomorrow night.  He is to restart his Xarelto  on Sunday night.   CAD (coronary artery disease)    CHF (congestive heart failure) 01/14/2017   Intermittent CHF with lower extremity edema.  I have given him Lasix  to take on a p.r.n. basis.Formatting of this note might be different from the original. Last Assessment & Plan: Formatting of this note might be different from the original. Intermittent CHF with lower extremity edema.  I have given him Las   Chronic kidney disease 10/27/2020   Stage II   Congestive cardiomyopathy 01/14/2017   Cardiomyopathy with LV dysfunction.Last ejection fraction 40-45% by echocardiography in 2012.Patient currently not taking beta-blocker Ace inhibitor for some reason.We will repeat echocardiography to evaluate LV function then determine the need for afterload reduction and beta-blocker therapy.[place]   Coronary atherosclerosis 01/14/2017   Status remote CABG in 2004 in [place] .Left internal mammary artery to the obtuse marginal.Free Right internal mammary artery to the LAD.Saphenous vein graft to OM 2.Saphenous vein graft to the RCA.  Asymptomatic without chest pain or shortness of breath.Last nuclear stress test May 2018:  Large   DVT (deep venous thrombosis)    Dyslipidemia 05/07/2018   Hematuria 08/10/2015   Hypertension    ICD (implantable cardioverter-defibrillator) battery depletion 06/16/2018   Status post dual-chamber ICD generator change 06/16/2018.Last Assessment & Plan: Formatting of this note might be different from the original.Left infraclavicular incision intact with Dermabond.  There is no pocket hematoma present.  No drainage noted.Device interrogation this a.m. reveals appropriate device function.   Ischemic cardiomyopathy 06/18/2016   Echo 09/14/2011 showing LA 4.9cm, EF 40-45%.   Mild to moderate decreased LV systolic function.  Mild concentric LVH.  LA is moderately dilated.  Mild aortic cusp sclerosis. Aortic valve appears to be bicuspid.B. Concerning for potential dysrhythmia (ventricular tachycardia) especially given documented nonsustained ventricular tachy   Mild dementia, unclear etiology 12/30/2022   Myocardial infarction    Obesity (BMI 30-39.9) 08/10/2015   Onychomycosis 05/10/2014   Painful legs and moving toes of left foot 05/10/2014   Paroxysmal atrial fibrillation 08/08/2015   A. Paroxysmal.B. On Sotalol therapy.Last Assessment & Plan: History of present atrial fibrillation.  Patient will 80 mg twice daily.Also on rivaroxaban  20 mg a day.Patient again refused an ECG.I explained to the patient, that the absence of an EKG, I canno   Peripheral vascular disease    Shortness of breath 10/27/2020   Exertional   Stroke 10/27/2020   Right frontal infarct per neuroimaging dating back at least to 2013   Syncope 01/14/2017   In the setting of ischemic cardiomyopathy. B. Concerning for potential dysrhythmia (ventricular tachycardia) especially given documented nonsustained ventricular tachycardia - EPS 9/25/201 showing reproducibly inducible nonsustained monomorphic VT up to 10 beat.  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  In the setting of ischemic cardiomyopathy. B. Concerning for potential dysrhythmia (ventricular tachycardia) especially given documented nonsustained ventricular tachycardia - EPS 9/25/201 showing reproducibly inducible nonsustained monomorphic VT up to 10 beat.  Reproducibly inducible nonsustained polymorphic VT.  Inducible VF wi    Past Surgical History:  Procedure Laterality Date   CARDIAC DEFIBRILLATOR PLACEMENT     CORONARY ARTERY BYPASS GRAFT  10/2003   IR RADIOLOGIST EVAL & MGMT  10/26/2020   PR  VEIN BYPASS GRAFT,AORTO-FEM-POP       Home Medications:  Prior to Admission medications   Medication Sig Start Date End Date Taking? Authorizing Provider  apixaban  (ELIQUIS ) 5 MG TABS tablet Take 1 tablet (5 mg total) by mouth 2 (two) times daily. 07/02/24   Petrina Pries, NP  atorvastatin  (LIPITOR) 20 MG tablet Take 1 tablet (20 mg total) by mouth daily. 09/22/23   Danton Reyes DASEN, MD  carvedilol  (COREG ) 3.125 MG tablet Take 1 tablet (3.125 mg total) by mouth 2 (two) times daily. 09/22/23   Danton Reyes DASEN, MD  ciclopirox  (PENLAC ) 8 % solution Apply topically at bedtime. Apply over nail and surrounding skin. Apply daily over previous coat. After seven (7) days, may remove with alcohol and continue cycle. Patient not taking: Reported on 08/24/2024 08/03/24   Lamount Ethan CROME, DPM  cyanocobalamin  (VITAMIN B12) 1000 MCG tablet Take 1 tablet (1,000 mcg total) by mouth daily. 03/03/23   Georgina Speaks, FNP  donepezil  (ARICEPT ) 10 MG tablet Take 1 tablet (10 mg total) by mouth daily. 07/02/24   Petrina Pries, NP  empagliflozin  (JARDIANCE ) 10 MG TABS tablet Take 1 tablet (10 mg total) by mouth daily. 08/24/24   Milford, Harlene HERO, FNP  Furosemide  (FUROSCIX ) 80 MG/10ML CTKT Inject 80 mg into the skin daily as needed. 08/24/24   Glena Harlene HERO, FNP  isosorbide  mononitrate (IMDUR ) 30 MG 24 hr tablet Take 30 mg by mouth daily. Patient is taking 1/2 tablet daily per instructions on his prescription bottle    [provider]  potassium chloride  SA (KLOR-CON  M) 20 MEQ tablet Take 3 tablets (60 mEq total) by mouth 2 (two) times daily. 07/28/24   Glena Harlene HERO, FNP  risperiDONE  (RISPERDAL ) 0.5 MG tablet Take 1 tablet (0.5 mg total) by mouth daily. 07/14/24   Moore, Janece, FNP  silver  sulfADIAZINE  (SILVADENE ) 1 % cream Apply 1 Application topically 2 (two) times daily. Clean site of wound and apply cream. Patient not taking: Reported on 08/24/2024 02/03/24   Arloa Suzen RAMAN, NP  SV IRON 325 (65 Fe) MG tablet  Take 325 mg by mouth 3 (three) times a week. 08/06/23   [provider]  torsemide  (DEMADEX ) 20 MG tablet Take 4 tablets (80 mg total) by mouth 2 (two) times daily. 07/28/24   Glena Harlene HERO, FNP    Scheduled Meds:  apixaban   5 mg Oral BID   atorvastatin   20 mg Oral Daily   carvedilol   3.125 mg Oral BID WC   cyanocobalamin   1,000 mcg Oral Daily   donepezil   10 mg Oral Daily   empagliflozin   10 mg Oral Daily   risperiDONE   0.5 mg Oral Daily   silver  sulfADIAZINE   1 Application Topical BID   sodium chloride  flush  3 mL Intravenous Q12H   Continuous Infusions:  sodium chloride      PRN Meds: sodium chloride , acetaminophen , sodium chloride  flush  Allergies:    Allergies  Allergen Reactions   Clopidogrel Anaphylaxis, Swelling, Other (See Comments) and Cough  Out of body experience     Social History:   Social History   Socioeconomic History   Marital status: Married    Spouse name: [name]   Number of children: 1   Years of education: 64   Highest education level: Master's degree (e.g., MA, MS, MEng, MEd, MSW, MBA)  Occupational History   Occupation: Chaplain    Comment: [employer]    Comment: newly retired  Tobacco Use   Smoking status: Never   Smokeless tobacco: Never  Vaping Use   Vaping status: Never Used  Substance and Sexual Activity   Alcohol use: No   Drug use: No   Sexual activity: Not on file  Other Topics Concern   Not on file  Social History Narrative   Lives with wife in a 2 story home.  Has 3 children.     Works as a Metallurgist.     Education: Masters in [employer].   Right handed   Caffeine yes   Social Drivers of Health   Financial Resource Strain: Medium Risk (02/19/2024)   Overall Financial Resource Strain (CARDIA)    Difficulty of Paying Living Expenses: Somewhat hard  Food Insecurity: No Food Insecurity (06/11/2024)   Received from [facility]   Hunger Vital Sign    Within the past 12 months, you worried that  your food would run out before you got the money to buy more.: Never true    Within the past 12 months, the food you bought just didn't last and you didn't have money to get more.: Never true  Transportation Needs: No Transportation Needs (06/11/2024)   Received from [facility]   PRAPARE - Transportation    Lack of Transportation (Medical): No    Lack of Transportation (Non-Medical): No  Physical Activity: Not on file  Stress: Not on file  Social Connections: Not on file  Intimate Partner Violence: Patient Unable To Answer (02/25/2024)   Humiliation, Afraid, Rape, and Kick questionnaire    Fear of Current or Ex-Partner: Patient unable to answer    Emotionally Abused: Patient unable to answer    Physically Abused: Patient unable to answer    Sexually Abused: Patient unable to answer    Family History:    Family History  Problem Relation Age of Onset   Heart disease Mother        before age 1   Hyperlipidemia Mother    Hypertension Mother    Heart attack Mother    Hyperlipidemia Father    Hypertension Father    Memory loss Brother    Memory loss Brother    Memory loss Half-Sister    Memory loss Maternal Aunt      ROS:  Please see the history of present illness.   All other ROS reviewed and negative.     Physical Exam/Data: Vitals:   08/26/24 1145 08/26/24 1412 08/26/24 1415 08/26/24 1431  BP:   126/72 122/75  Pulse: (!) 111   79  Resp: 17  20 16   Temp:  97.9 F (36.6 C)    TempSrc:  Oral    SpO2: 100%  100% 95%    Intake/Output Summary (Last 24 hours) at 08/26/2024 1601 Last data filed at 08/26/2024 1125 Gross per 24 hour  Intake --  Output 1050 ml  Net -1050 ml      08/24/2024    3:45 PM 07/30/2024    2:53 PM 07/28/2024    1:26 PM  Last 3 Weights  Weight (lbs) 303 lb 3.2 oz 286 lb 298 lb 3.2 oz  Weight (kg) 137.531 kg 129.729 kg 135.263 kg     There is no height or weight on file to calculate BMI.  General:  obese male in NAD, pleasantly  confused HEENT: scleral icterus Neck: + JVD Vascular: No carotid bruits; Distal pulses 2+ bilaterally Cardiac:  irregular rhythm, regular rate Lungs:  diminished on right, base, more clear on left  Abd: soft, nontender, no hepatomegaly  Ext: significant edema and blisters, wound on right LE covered, wrapped Musculoskeletal:  No deformities, BUE and BLE strength normal and equal Skin: warm and wet Neuro:  not oriented to place or date Psych:  Normal affect   EKG:  The EKG was personally reviewed and demonstrates:  Afib VR 77 Telemetry:  Telemetry was personally reviewed and demonstrates:  Afib with rates in the 60-70s with PVCs  Relevant CV Studies:  Echo today Pending final read   Echo 08/2023:  1. Left ventricular ejection fraction, by estimation, is 35 to 40%. The  left ventricle has moderately decreased function. The left ventricle  demonstrates regional wall motion abnormalities (see scoring  diagram/findings for description). There is mild  left ventricular hypertrophy. Left ventricular diastolic parameters are  indeterminate.   2. No formed LV mural thrombus noted with Definity  contrast.   3. Right ventricular systolic function is mildly reduced. The right  ventricular size is not well visualized. There is mildly elevated  pulmonary artery systolic pressure. The estimated right ventricular  systolic pressure is 40.2 mmHg.   4. A small pericardial effusion is present. The pericardial effusion is  posterior to the left ventricle.   5. The mitral valve is degenerative. Mild mitral valve regurgitation.   6. The aortic valve is tricuspid. There is mild calcification of the  aortic valve. Aortic valve regurgitation is not visualized. Aortic valve  sclerosis is present, with no evidence of aortic valve stenosis.   7. Aortic dilatation noted. There is borderline dilatation of the  ascending aorta, measuring 37 mm.   8. The inferior vena cava is dilated in size with <50%  respiratory  variability, suggesting right atrial pressure of 15 mmHg.   Laboratory Data: High Sensitivity Troponin:   Recent Labs  Lab 08/26/24 0607 08/26/24 0845  TROPONINIHS 46* 41*     Chemistry Recent Labs  Lab 08/24/24 1611 08/26/24 0607 08/26/24 0611 08/26/24 0845  NA 139 142 139  --   K 3.1* 3.4* 3.2*  --   CL 98 99 101  --   CO2 30 25  --   --   GLUCOSE 103* 88 84  --   BUN 26* 29* 29*  --   CREATININE 2.09* 2.28* 2.40*  --   CALCIUM  8.7* 9.2  --   --   MG  --   --   --  1.8  GFRNONAA 34* 30*  --   --   ANIONGAP 11 18*  --   --     Recent Labs  Lab 08/26/24 0845  PROT 7.0  ALBUMIN 3.0*  AST 47*  ALT 19  ALKPHOS 187*  BILITOT 8.7*   Lipids No results for input(s): CHOL, TRIG, HDL, LABVLDL, LDLCALC, CHOLHDL in the last 168 hours.  Hematology Recent Labs  Lab 08/26/24 0607 08/26/24 0611  WBC 5.5  --   RBC 4.18*  --   HGB 11.8* 12.9*  HCT 35.8* 38.0*  MCV 85.6  --   MCH 28.2  --  MCHC 33.0  --   RDW 16.9*  --   PLT 158  --    Thyroid No results for input(s): TSH, FREET4 in the last 168 hours.  BNP Recent Labs  Lab 08/24/24 1611 08/26/24 0607  BNP 617.6* 556.3*    DDimer No results for input(s): DDIMER in the last 168 hours.  Radiology/Studies:  DG Chest 2 View Result Date: 08/26/2024 CLINICAL DATA:  Shortness of breath, increasing fatigue, and lower extremity swelling. EXAM: CHEST - 2 VIEW COMPARISON:  PA Lat chest 09/12/2023 FINDINGS: There are continued small-to-moderate layering pleural effusions, slightly more so on the right. There is overlying atelectasis or airspace disease in the lower lung fields, more noticeable on the right due to the elevated hemidiaphragm. The left mid and both upper lung fields remain clear. Left chest dual lead pacing system and wire insertions are unaltered. There are intact sternotomy sutures with old CABG change, mild cardiomegaly. No vascular congestion is seen. The mediastinum is stable.  Aortic atherosclerosis. No new osseous findings.  Mild thoracic spondylosis. IMPRESSION: 1. Continued small-to-moderate layering pleural effusions, slightly more so on the right. 2. Overlying atelectasis or airspace disease in the lower lung fields, more noticeable on the right due to the elevated hemidiaphragm. Overall aeration seems unchanged from 1 year ago. 3. Stable cardiomegaly. No vascular congestion. Electronically Signed   By: Francis Quam M.D.   On: 08/26/2024 06:44     Assessment and Plan:  Acute on chronic systolic heart failure Ischemic cardiomyopathy -- LVEF has fluctuated between 30 and 45%, recent echocardiogram 05/2024 with LVEF 40-45%, repeat echo read pending - he has struggled with hypervolemia despite titration of medications in the clinic - Home torsemide  80 mg twice daily +60 KCl twice daily -GDMT: Coreg  3.125 mg twice daily, 10 mg Jardiance  daily, torsemide  80 mg twice daily -- volume status has been difficult with medical nonadherence, CKD, BP, and dementia -- his wife confirms he has been volume up for greater than 1 month -- pt is not oriented to place or date -- agree with IV lasix , but may ultimately need lasix  gtt and consideration of thoracentesis   CAD s/p CABG (LIMA-OM, free RIMA-LAD, SVG-OM 2, SVG-RCA)-2003 - no chest pain   Persistent atrial fibrillation S/p atrial fibrillation ablation Chronic anticoagulation - Eliquis  5 mg twice daily, Coreg  as above -Device interrogation with 100% A-fib burden, no VT -- unclear if compliant on eliquis    CKD IV -- Baseline creatinine 2.3-2.5 -- sCr    VT - History of ICD placement - Follows with EP - PVCs on telemetry   PAD - Follows with [facility], history of osteomyelitis, chronic LE wound   Hepatic congestion - elevated LFTs and bilirubin - scleral icterus  - suspect due to CHF   Disposition Pt with advanced demential. Wife helps at home but she continues to work full time as a [employer] officer  in [place].  Suspect we may need to consider palliative care consult. HH RN to help with medications can be considered to avoid repeat hospitalizations. Not a candidate for advanced therapies.    Risk Assessment/Risk Scores:      New York  Heart Association (NYHA) Functional Class NYHA Class IV  CHA2DS2-VASc Score = 4   This indicates a 4.8% annual risk of stroke. The patient's score is based upon: CHF History: 1 HTN History: 1 Diabetes History: 0 Stroke History: 0 Vascular Disease History: 1 Age Score: 1 Gender Score: 0      For questions  or updates, please contact [facility] Please consult [email or web address] for contact info under    Signed, Jon Nat Hails, PA  08/26/2024 4:01 PM

## 2024-08-26 NOTE — ED Notes (Signed)
 Respiratory leads not picking up auto resps, manual RR 20. Will monitor. Alert, NAD, calm, interactive. SPO2 100% RA. Extreme gross ascites/ edema, including scrotum and penis. Unable to find shaft or meatus. Male purewick placed.

## 2024-08-26 NOTE — Assessment & Plan Note (Addendum)
 S/p IV lasix  80 mg x1. Discussed option of palliative care with patient's daughter given dementia and HF Admit to FMTS, Attending Dr. Donah  - Cardiology consulted, appreciate recs  - Continue jardiance , coreg   - Heart healthy diet  - Cardiac telemetry - Strict ins and outs, daily weights   - AM BMP, Mag  - PT and OT starting tomorrow

## 2024-08-26 NOTE — Assessment & Plan Note (Signed)
 Follows with wound care clinic out patient has multiple venous stasis ulcers  - WOC consulted  - continue silver  sulfadiazine  BID

## 2024-08-26 NOTE — ED Notes (Signed)
 Beginning to diurese, urine collecting in suction canister

## 2024-08-26 NOTE — Hospital Course (Addendum)
 68 y.o. M with dementia, HFrEF EF 35-40%, CAD s/p CABG 2004, hx ICD and PPM, obesity, persAF on Eliquis , CKD IV baseline 2.1-2.5, PVD and chronic LE ulcers who presented with few weeks progressive SOB and swelling.  Seen in Cards clinic yesterday, refused admission.  Today, presented to the ER.  Cr 2.28, K 3.4, WBC normal.  BNP 556, trop low and flat.  CXR with edema and effusions.  ECG rate controlled Afib.

## 2024-08-26 NOTE — Consult Note (Signed)
 WOC Nurse Consult Note: Reason for Consult:   patient has bilateral lower extremity wounds  Patient is followed by the outpatient wound care center; last seen 08/16/24 Wound type: Venous stasis  Pressure Injury POA: NA Measurement: see nursing flow sheets Wound azi:ozqu pretibial (1 100% pink/1 yellow serous crust) right pretibial ( anterior pretibial 100% pink/ lateral 100% yellow  Drainage (amount, consistency, odor) see nursing flow sheets Periwound:intact  Dressing procedure/placement/frequency: Cleanse all LE wounds with Carolynn Soila # [phone]), apply calicum alginate (Lawson #[id number]) cut to fit on each open wound, top with foam dressing. Wrap from base of toes to knees with kerlix followed by ACE wrap. Important to wrap from base of toes to the knee.  FU with wound care center as scheduled   Re consult if needed, will not follow at this time. Thanks  Duanne Duchesne [employer], RN,CWOCN, CNS, [employer] [phone]

## 2024-08-26 NOTE — ED Notes (Signed)
 Nt called CCMD@10 :55am

## 2024-08-26 NOTE — ED Triage Notes (Signed)
 Pt states that he has had increased fatigue, SOB and lower extremity swelling x 3 weeks.

## 2024-08-26 NOTE — ED Notes (Signed)
 Admitting MD at [facility], no changes, VSS, pt intermittently sleeping, NAD, calm. RR 18.

## 2024-08-27 ENCOUNTER — Inpatient Hospital Stay (HOSPITAL_COMMUNITY)

## 2024-08-27 DIAGNOSIS — I5043 Acute on chronic combined systolic (congestive) and diastolic (congestive) heart failure: Secondary | ICD-10-CM

## 2024-08-27 DIAGNOSIS — I5023 Acute on chronic systolic (congestive) heart failure: Secondary | ICD-10-CM | POA: Diagnosis not present

## 2024-08-27 LAB — ECHOCARDIOGRAM LIMITED: Weight: 4730.19 [oz_av]

## 2024-08-27 LAB — COMPREHENSIVE METABOLIC PANEL WITH GFR
ALT: 18 U/L (ref 0–44)
AST: 30 U/L (ref 15–41)
Albumin: 2.7 g/dL — ABNORMAL LOW (ref 3.5–5.0)
Alkaline Phosphatase: 178 U/L — ABNORMAL HIGH (ref 38–126)
Anion gap: 12 (ref 5–15)
BUN: 31 mg/dL — ABNORMAL HIGH (ref 8–23)
CO2: 27 mmol/L (ref 22–32)
Calcium: 8.6 mg/dL — ABNORMAL LOW (ref 8.9–10.3)
Chloride: 98 mmol/L (ref 98–111)
Creatinine, Ser: 2.47 mg/dL — ABNORMAL HIGH (ref 0.61–1.24)
GFR, Estimated: 28 mL/min — ABNORMAL LOW (ref >60)
Glucose, Bld: 152 mg/dL — ABNORMAL HIGH (ref 70–99)
Potassium: 3.2 mmol/L — ABNORMAL LOW (ref 3.5–5.1)
Sodium: 137 mmol/L (ref 135–145)
Total Bilirubin: 7 mg/dL — ABNORMAL HIGH (ref 0.0–1.2)
Total Protein: 6.3 g/dL — ABNORMAL LOW (ref 6.5–8.1)

## 2024-08-27 LAB — MAGNESIUM
Magnesium: 2 mg/dL (ref 1.7–2.4)
Magnesium: 2.2 mg/dL (ref 1.7–2.4)

## 2024-08-27 MED ORDER — PERFLUTREN LIPID MICROSPHERE
1.0000 mL | INTRAVENOUS | Status: AC | PRN
  Administered 2024-08-27: 10 mL via INTRAVENOUS

## 2024-08-27 MED ORDER — POTASSIUM CHLORIDE 20 MEQ PO PACK
40.0000 meq | PACK | ORAL | Status: AC
  Administered 2024-08-27 (×2): 40 meq via ORAL
  Filled 2024-08-27 (×2): qty 2

## 2024-08-27 NOTE — Plan of Care (Signed)
   Problem: Education: Goal: Knowledge of General Education information will improve Description: Including pain rating scale, medication(s)/side effects and non-pharmacologic comfort measures Outcome: Progressing   Problem: Clinical Measurements: Goal: Will remain free from infection Outcome: Progressing   Problem: Clinical Measurements: Goal: Respiratory complications will improve Outcome: Progressing

## 2024-08-27 NOTE — Assessment & Plan Note (Addendum)
 Patient has history of chronic hyperbilirubinemia of unknown etiology and prior cholecystectomy. Bilirubin elevated but downtrending. Alk phos elevated. LFT's normal. -May consider right upper quadrant US  for further evaluation

## 2024-08-27 NOTE — Progress Notes (Signed)
 Daily Progress Note Intern Pager: [phone]  Patient name: Connor Burns. Medical record number: [id number] Date of birth: 24-May-1956 Age: 68 y.o. Gender: male  Primary Care Provider: Georgina Speaks, FNP Consultants: Cardiology Code Status: Full - confirmed with patient's daughter  Pt Overview and Major Events to Date:  9/4 - Admitted  Assessment and Plan:  Connor Burns is a 68 year old male presenting with shortness of breath and edema, found to have acute on chronic heart failure exacerbation. Pertinent PMH/PSH includes CAD, permanent A-fib, CHF secondary to ischemic cardiomyopathy, ICD placement, stage IV CKD, dementia, prior stroke. Assessment & Plan Acute on chronic combined systolic and diastolic CHF (congestive heart failure) (HCC) Acute exacerbation likely secondary to medication non-adherence. Echo on 9/4 with LVEF of 30-35%. Patient tolerating lasix  well, with fluid output of 2 L yesterday. Patient no longer endorsing dyspnea. On physical exam, patient appears less volume overloaded (lungs clearer to auscultation). Discussed option of palliative care with patient's daughter given historical diagnosis of dementia and HF. - Cardiology consulted, appreciate recs  - Lasix  80 mg BID IV today - Continue jardiance , coreg   - Heart healthy diet  - Cardiac telemetry - Strict ins and outs, daily weights   - AM BMP, Mag  - PT and OT starting today - Palliative care consulted, appreciate assistance - F/u on repeat echo Hypokalemia Secondary to chronic diuresis and diet  - Repleted overnight - F/u AM BMP and replete as necessary  Multiple open wounds of lower extremity Follows with wound care clinic outpatient; has multiple venous stasis ulcers  - WOC consulted  - Continue silver  sulfadiazine  BID  Hyperbilirubinemia Patient has history of chronic hyperbilirubinemia of unknown etiology and prior cholecystectomy. Bilirubin elevated but downtrending. Alk phos elevated. LFT's  normal. -May consider right upper quadrant US  for further evaluation     Chronic and Stable Issues: Afib: continue coreg , continue BID eliquis   Dementia: continue donepezil  and risperidone     FEN/GI: heart healthy diet PPx: on home Eliquis  Dispo:Pending PT recommendations  and discussion with family. Barriers include ongoing need for medical management.   Subjective:  Patient seen and evaluated at bedside. On assessment today, patient is alert and answers questions appropriately. Patient reporting he is no longer having dyspnea on room air. He denies current pain. Per other staff member, patient's wife will be coming to visit him this morning. Informed patient that palliative care will be coming to see him and his wife today.  Objective: Temp:  [97.6 F (36.4 C)-98.2 F (36.8 C)] 97.9 F (36.6 C) (09/05 0442) Pulse Rate:  [55-111] 71 (09/05 0442) Resp:  [0-22] 17 (09/05 0442) BP: (109-140)/(67-93) 126/79 (09/05 0442) SpO2:  [95 %-100 %] 98 % (09/05 0442) Weight:  [134.1 kg-136.7 kg] 134.1 kg (09/05 0442)  Physical Exam: General: alert, obese male lying in bed, NAD Cardiovascular: Irregular rate and rhythm Respiratory: mild decrease in breath sounds bilaterally Abdomen: Distended, soft, non-tender Extremities: Bilateral lower extremity edema; lower legs wrapped in bandages  Laboratory: Most recent CBC Lab Results  Component Value Date   WBC 5.5 08/26/2024   HGB 12.9 (L) 08/26/2024   HCT 38.0 (L) 08/26/2024   MCV 85.6 08/26/2024   PLT 158 08/26/2024   Most recent BMP    Latest Ref Rng & Units 08/27/2024   12:25 AM  BMP  Glucose 70 - 99 mg/dL 847   BUN 8 - 23 mg/dL 31   Creatinine 9.38 - 1.24 mg/dL 7.52   Sodium 864 - 854  mmol/L 137   Potassium 3.5 - 5.1 mmol/L 3.2   Chloride 98 - 111 mmol/L 98   CO2 22 - 32 mmol/L 27   Calcium  8.9 - 10.3 mg/dL 8.6     Other pertinent labs: Bilirubin 7.0, Alk Phos 178  Imaging/Diagnostic Tests:  Echocardiogram  Radiologist  Impression:  1. LV not seen well (see comment below). There is LV dyssynchrony due to  LBBB. Left ventricular ejection fraction, by estimation, is 30 to 35%. The left ventricle has moderately decreased function. Left ventricular endocardial border not optimally defined to evaluate regional wall motion. There is mild concentric left ventricular hypertrophy. Left ventricular diastolic parameters are indeterminate.   2. Right ventricular systolic function is normal. The right ventricular size is normal.   3. Left atrial size was mildly dilated.   4. The mitral valve is normal in structure. Trivial mitral valve regurgitation. No evidence of mitral stenosis.   5. The aortic valve is tricuspid. There is moderate calcification of the aortic valve. Aortic valve regurgitation is not visualized. Aortic valve sclerosis/calcification is present, without any evidence of aortic stenosis.   6. The inferior vena cava is dilated in size with <50% respiratory variability, suggesting right atrial pressure of 15 mmHg.   Mannie [name] SAILOR, MD 08/27/2024, 7:22 AM  PGY-1, [facility] FPTS Intern pager: [phone], text pages welcome Secure chat group [facility] Teaching Service

## 2024-08-27 NOTE — Progress Notes (Signed)
 Rounding Note   Patient Name: Connor Burns. Date of Encounter: 08/27/2024  [facility] Cardiologist: [name], DO   Subjective Feeling well.  Breathing improving.   Scheduled Meds:  apixaban   5 mg Oral BID   atorvastatin   20 mg Oral Daily   carvedilol   3.125 mg Oral BID WC   cyanocobalamin   1,000 mcg Oral Daily   donepezil   10 mg Oral Daily   empagliflozin   10 mg Oral Daily   furosemide   80 mg Intravenous BID   melatonin  3 mg Oral QHS   risperiDONE   0.5 mg Oral QHS   silver  sulfADIAZINE   1 Application Topical BID   sodium chloride  flush  3 mL Intravenous Q12H   Continuous Infusions:  sodium chloride      PRN Meds: sodium chloride , acetaminophen , sodium chloride  flush   Vital Signs  Vitals:   08/26/24 1924 08/26/24 2318 08/27/24 0442 08/27/24 0826  BP: 111/67 133/75 126/79 127/78  Pulse: 81 75 71   Resp: (!) 21 (!) 22 17 20   Temp: 97.9 F (36.6 C) 98.2 F (36.8 C) 97.9 F (36.6 C) 97.9 F (36.6 C)  TempSrc: Oral Oral Oral Oral  SpO2: 100% 97% 98% 98%  Weight:   134.1 kg     Intake/Output Summary (Last 24 hours) at 08/27/2024 0923 Last data filed at 08/27/2024 0500 Gross per 24 hour  Intake 360 ml  Output 2650 ml  Net -2290 ml      08/27/2024    4:42 AM 08/26/2024    6:00 PM 08/24/2024    3:45 PM  Last 3 Weights  Weight (lbs) 295 lb 10.2 oz 301 lb 5.9 oz 303 lb 3.2 oz  Weight (kg) 134.1 kg 136.7 kg 137.531 kg      Telemetry Atrial fibrillation.  Rate <100 bpm.  PVCs.  - Personally Reviewed  ECG  N/a - Personally Reviewed  Physical Exam  VS:  BP 127/78 (BP Location: Left Arm)   Pulse 71   Temp 97.9 F (36.6 C) (Oral)   Resp 20   Wt 134.1 kg   SpO2 98%   BMI 37.96 kg/m  , BMI Body mass index is 37.96 kg/m. GENERAL:  Well appearing HEENT: Pupils equal round and reactive, fundi not visualized, oral mucosa unremarkable NECK:  + jugular venous distention, waveform within normal limits, carotid upstroke brisk and symmetric, no bruits,  no thyromegaly LYMPHATICS:  No cervical adenopathy LUNGS:  Clear to auscultation bilaterally HEART:  Irregularly irregular.  PMI not displaced or sustained,S1 and S2 within normal limits, no S3, no S4, no clicks, no rubs, no murmurs ABD:  Flat, positive bowel sounds normal in frequency in pitch, no bruits, no rebound, no guarding, no midline pulsatile mass, no hepatomegaly, no splenomegaly EXT:  2 plus pulses throughout, 2+ LE edema, no cyanosis no clubbing SKIN:  Anterior tibia ulcerations and blisters NEURO:  Cranial nerves II through XII grossly intact, motor grossly intact throughout PSYCH:  Confused.  You look like Connor Burns.   Labs High Sensitivity Troponin:   Recent Labs  Lab 08/26/24 0607 08/26/24 0845  TROPONINIHS 46* 41*     Chemistry Recent Labs  Lab 08/24/24 1611 08/26/24 0607 08/26/24 0611 08/26/24 0845 08/27/24 0025  NA 139 142 139  --  137  K 3.1* 3.4* 3.2*  --  3.2*  CL 98 99 101  --  98  CO2 30 25  --   --  27  GLUCOSE 103* 88 84  --  152*  BUN 26* 29* 29*  --  31*  CREATININE 2.09* 2.28* 2.40*  --  2.47*  CALCIUM  8.7* 9.2  --   --  8.6*  MG  --   --   --  1.8 2.0  PROT  --   --   --  7.0 6.3*  ALBUMIN  --   --   --  3.0* 2.7*  AST  --   --   --  47* 30  ALT  --   --   --  19 18  ALKPHOS  --   --   --  187* 178*  BILITOT  --   --   --  8.7* 7.0*  GFRNONAA 34* 30*  --   --  28*  ANIONGAP 11 18*  --   --  12    Lipids No results for input(s): CHOL, TRIG, HDL, LABVLDL, LDLCALC, CHOLHDL in the last 168 hours.  Hematology Recent Labs  Lab 08/26/24 0607 08/26/24 0611  WBC 5.5  --   RBC 4.18*  --   HGB 11.8* 12.9*  HCT 35.8* 38.0*  MCV 85.6  --   MCH 28.2  --   MCHC 33.0  --   RDW 16.9*  --   PLT 158  --    Thyroid No results for input(s): TSH, FREET4 in the last 168 hours.  BNP Recent Labs  Lab 08/24/24 1611 08/26/24 0607  BNP 617.6* 556.3*    DDimer No results for input(s): DDIMER in the last 168 hours.    Radiology  ECHOCARDIOGRAM COMPLETE Result Date: 08/26/2024    ECHOCARDIOGRAM REPORT   Patient Name:   Connor Burns. Date of Exam: 08/26/2024 Medical Rec #:  [id number]        Height:       74.0 in Accession #:    [id number]       Weight:       303.2 lb Date of Birth:  1956-04-21         BSA:          2.594 m Patient Age:    68 years         BP:           129/81 mmHg Patient Gender: M                HR:           73 bpm. Exam Location:  Inpatient Procedure: 2D Echo, Color Doppler and Cardiac Doppler (Both Spectral and Color            Flow Doppler were utilized during procedure). Indications:    CHF Acute systolic I50.21  History:        Patient has prior history of Echocardiogram examinations, most                 recent 09/13/2023.  Sonographer:    Tinnie Gosling RDCS Referring Phys: [id number] CHRISTOPHER P DANFORD IMPRESSIONS  1. LV not seen well (see comment below). There is LV dyssynchrony due to LBBB. Left ventricular ejection fraction, by estimation, is 30 to 35%. The left ventricle has moderately decreased function. Left ventricular endocardial border not optimally defined to evaluate regional wall motion. There is mild concentric left ventricular hypertrophy. Left ventricular diastolic parameters are indeterminate.  2. Right ventricular systolic function is normal. The right ventricular size is normal.  3. Left atrial size was mildly dilated.  4. The mitral valve is normal in structure. Trivial mitral  valve regurgitation. No evidence of mitral stenosis.  5. The aortic valve is tricuspid. There is moderate calcification of the aortic valve. Aortic valve regurgitation is not visualized. Aortic valve sclerosis/calcification is present, without any evidence of aortic stenosis.  6. The inferior vena cava is dilated in size with <50% respiratory variability, suggesting right atrial pressure of 15 mmHg. Conclusion(s)/Recommendation(s): The LV endocardium is nevere visualized well EF appears to be in the 30-35%  range but it is hard to be certain. Will repeat limited echo with Definity  contrast tomorrow. FINDINGS  Left Ventricle: LV not seen well (see comment below). There is LV dyssynchrony due to LBBB. Left ventricular ejection fraction, by estimation, is 30 to 35%. The left ventricle has moderately decreased function. Left ventricular endocardial border not optimally defined to evaluate regional wall motion. The left ventricular internal cavity size was normal in size. There is mild concentric left ventricular hypertrophy. Abnormal (paradoxical) septal motion, consistent with left bundle branch block. Left ventricular diastolic parameters are indeterminate. Right Ventricle: The right ventricular size is normal. No increase in right ventricular wall thickness. Right ventricular systolic function is normal. Left Atrium: Left atrial size was mildly dilated. Right Atrium: Right atrial size was normal in size. Pericardium: There is no evidence of pericardial effusion. Mitral Valve: The mitral valve is normal in structure. There is mild calcification of the mitral valve leaflet(s). Trivial mitral valve regurgitation. No evidence of mitral valve stenosis. Tricuspid Valve: The tricuspid valve is normal in structure. Tricuspid valve regurgitation is trivial. No evidence of tricuspid stenosis. Aortic Valve: The aortic valve is tricuspid. There is moderate calcification of the aortic valve. Aortic valve regurgitation is not visualized. Aortic valve sclerosis/calcification is present, without any evidence of aortic stenosis. Pulmonic Valve: The pulmonic valve was normal in structure. Pulmonic valve regurgitation is trivial. No evidence of pulmonic stenosis. Aorta: The aortic root is normal in size and structure. Venous: The inferior vena cava is dilated in size with less than 50% respiratory variability, suggesting right atrial pressure of 15 mmHg. IAS/Shunts: No atrial level shunt detected by color flow Doppler.  LEFT VENTRICLE  PLAX 2D LVIDd:         5.30 cm LVIDs:         3.50 cm LV PW:         1.30 cm LV IVS:        1.30 cm LVOT diam:     2.10 cm LV SV:         49 LV SV Index:   19 LVOT Area:     3.46 cm  RIGHT VENTRICLE            IVC RV S prime:     9.46 cm/s  IVC diam: 3.00 cm LEFT ATRIUM             Index LA diam:        4.20 cm 1.62 cm/m LA Vol (A2C):   39.1 ml 15.07 ml/m LA Vol (A4C):   54.0 ml 20.82 ml/m LA Biplane Vol: 49.9 ml 19.24 ml/m  AORTIC VALVE LVOT Vmax:   81.30 cm/s LVOT Vmean:  52.000 cm/s LVOT VTI:    0.142 m  AORTA Ao Root diam: 3.00 cm Ao Asc diam:  3.50 cm  SHUNTS Systemic VTI:  0.14 m Systemic Diam: 2.10 cm Toribio Fuel MD Electronically signed by Toribio Fuel MD Signature Date/Time: 08/26/2024/6:50:02 PM    Final    DG Chest 2 View Result Date: 08/26/2024 CLINICAL DATA:  Shortness  of breath, increasing fatigue, and lower extremity swelling. EXAM: CHEST - 2 VIEW COMPARISON:  PA Lat chest 09/12/2023 FINDINGS: There are continued small-to-moderate layering pleural effusions, slightly more so on the right. There is overlying atelectasis or airspace disease in the lower lung fields, more noticeable on the right due to the elevated hemidiaphragm. The left mid and both upper lung fields remain clear. Left chest dual lead pacing system and wire insertions are unaltered. There are intact sternotomy sutures with old CABG change, mild cardiomegaly. No vascular congestion is seen. The mediastinum is stable. Aortic atherosclerosis. No new osseous findings.  Mild thoracic spondylosis. IMPRESSION: 1. Continued small-to-moderate layering pleural effusions, slightly more so on the right. 2. Overlying atelectasis or airspace disease in the lower lung fields, more noticeable on the right due to the elevated hemidiaphragm. Overall aeration seems unchanged from 1 year ago. 3. Stable cardiomegaly. No vascular congestion. Electronically Signed   By: Francis Quam M.D.   On: 08/26/2024 06:44    Cardiac Studies Echo  08/26/24:   1. LV not seen well (see comment below). There is LV dyssynchrony due to  LBBB. Left ventricular ejection fraction, by estimation, is 30 to 35%. The  left ventricle has moderately decreased function. Left ventricular  endocardial border not optimally  defined to evaluate regional wall motion. There is mild concentric left  ventricular hypertrophy. Left ventricular diastolic parameters are  indeterminate.   2. Right ventricular systolic function is normal. The right ventricular  size is normal.   3. Left atrial size was mildly dilated.   4. The mitral valve is normal in structure. Trivial mitral valve  regurgitation. No evidence of mitral stenosis.   5. The aortic valve is tricuspid. There is moderate calcification of the  aortic valve. Aortic valve regurgitation is not visualized. Aortic valve  sclerosis/calcification is present, without any evidence of aortic  stenosis.   6. The inferior vena cava is dilated in size with <50% respiratory  variability, suggesting right atrial pressure of 15 mmHg.   Patient Profile   Mr. Lienhard is a 59M with HFrEF, CAD s/p CABG, CKD 4, dementia, persistent atrial fibrillation, VT s/p ICD, obesity, and PAD admitted with acute on chronic HF.    Assessment & Plan  # Acute on chronic HFrEF:  # Hypertension:  # LE wounds:  LVEF 35-40% previously, now 30-35%.  RA pressure 15 mmHg.  Patient admitted with HF in the setting of challenges with adherence.  He saw the HF clinic and was unable to be managed as an outpatient.  Continue diuresis with IV lasix .  He is net -2.3L in the last 24 hours.  Weight 134.1 kg from  136.7 kg.  Wound management for LE wounds.  Continue Jardiance .  He is not on MRA or ARNI/ARB 2/2 CKD.  Agree with involving Palliative care given his underlying dementia and difficulty with medication adherence.  He is not a candidate for advanced therapies.     # CAD s/p CABG:  # Hyperlipidemia:  # Demand ischemia:  No ischemic  symptoms at this time. Hs-troponin is elevated to 46-->41.  Pattern is consistent with demand ischemia.  He denies chest pain. Continue carvedilol  and atorvastatin .    # Persistent atrial fibrillation: Rate controlled.  Failed ablation.  Continue carvedilol  and Eliquis .    # VT: S/p ICD.  Continue carvedilol .    # Transaminitis:  Likely due to hepatic congestion.  Look for improvement with diuresis.       For questions or  updates, please contact [facility] Please consult [email or web address] for contact info under     Signed, Annabella Scarce, MD  08/27/2024, 9:23 AM

## 2024-08-27 NOTE — Plan of Care (Signed)
     Referral received for Connor Burns. :goals of care discussion. Chart reviewed and updates received from RN. Patient assessed and is unable to engage appropriately in discussions. Attempted to contact patient's wife Connor Burns. Unable to reach. Voicemail left with contact information given. Per RN, she was supposed to visit today but still has not arrived.  PMT will re-attempt to contact family at a later time/date. Detailed note and recommendations to follow once GOC has been completed.   Thank you for your referral and allowing PMT to assist in Mr. Connor Patino Jr.'s care.   Jenni Thew, PA-C Palliative Medicine Team  Team Phone # [phone]   NO CHARGE

## 2024-08-27 NOTE — Assessment & Plan Note (Addendum)
 Secondary to chronic diuresis and diet  - Repleted overnight - F/u AM BMP and replete as necessary

## 2024-08-27 NOTE — Addendum Note (Signed)
 Encounter addended by: Glena Harlene HERO, FNP on: 08/27/2024 4:27 PM  Actions taken: Clinical Note Signed

## 2024-08-27 NOTE — Assessment & Plan Note (Addendum)
 Follows with wound care clinic outpatient; has multiple venous stasis ulcers  - WOC consulted  - Continue silver  sulfadiazine  BID

## 2024-08-27 NOTE — Evaluation (Signed)
 Occupational Therapy Evaluation Patient Details Name: Connor Burns. MRN: [id number] DOB: 1956/05/11 Today's Date: 08/27/2024   History of Present Illness   68 yo male adm 08/26/24 with SOB, LB edema, CHF exacerbation due to medication nonadherence. PMHx: CAD, CHF, ICM, ICD, Afib, CKD, HLD, dementia, CVA     Clinical Impressions Pt admitted based on above, and was seen based on problem list below. PTA pt was living with his wife and receiving assistance for ADLs. Today pt is requiring set up  to mod assist for ADLs. Bed mobility and functional transfers are  min  assist. Pt with baseline cog deficits, today presenting with poor initiation, sequencing, organization, and problem solving skills creating high fall risk and safety concerns. Pt from home with wife who currently works and reports pt is home alone Tuesdays and Thursdays.D/t pt's assist levels and cog deficits pt needs <3 hours of skilled rehab daily. Per PT's conversation with family, reporting a preference for d/c home. If pt d/c home then pt would need 24/7 supervision and max HH services to reduce caregiver burden and promote safety within the home environment. OT will continue to follow acutely to maximize functional independence.        If plan is discharge home, recommend the following:   A little help with walking and/or transfers;A lot of help with bathing/dressing/bathroom;Assistance with cooking/housework;Direct supervision/assist for medications management;Direct supervision/assist for financial management;Help with stairs or ramp for entrance;Supervision due to cognitive status     Functional Status Assessment   Patient has had a recent decline in their functional status and demonstrates the ability to make significant improvements in function in a reasonable and predictable amount of time.     Equipment Recommendations   BSC/3in1;Tub/shower seat      Precautions/Restrictions   Precautions Precautions:  Fall Recall of Precautions/Restrictions: Impaired Restrictions Weight Bearing Restrictions Per Provider Order: No     Mobility Bed Mobility Overal bed mobility: Needs Assistance Bed Mobility: Supine to Sit, Sit to Supine     Supine to sit: Min assist Sit to supine: Min assist   General bed mobility comments: Significantly increased time, cues to sequence, asssist to scoot EOB, and return legs to bed    Transfers Overall transfer level: Needs assistance Equipment used: Rolling walker (2 wheels) Transfers: Sit to/from Stand Sit to Stand: Contact guard assist     General transfer comment: Increased time for STS, able to take lateral steps along EOB at CGA, maintain standing for >5 minutes d/t scrotum pain      Balance Overall balance assessment: Needs assistance Sitting-balance support: No upper extremity supported, Feet supported Sitting balance-Leahy Scale: Fair     Standing balance support: During functional activity, Reliant on assistive device for balance Standing balance-Leahy Scale: Poor Standing balance comment: Reliant on BUE support           ADL either performed or assessed with clinical judgement   ADL Overall ADL's : Needs assistance/impaired Eating/Feeding: Set up;Sitting   Grooming: Set up;Sitting   Upper Body Bathing: Set up;Sitting   Lower Body Bathing: Moderate assistance;Sit to/from stand Lower Body Bathing Details (indicate cue type and reason): Assist  to reach BLEs and standing balance Upper Body Dressing : Set up;Sitting   Lower Body Dressing: Moderate assistance;Sit to/from stand Lower Body Dressing Details (indicate cue type and reason): Assist to reach BLEs and pull above waist in standing Toilet Transfer: Contact guard assist;Rolling walker (2 wheels) Toilet Transfer Details (indicate cue type and reason): Short step pivot  Functional mobility during ADLs: Contact guard assist;Rolling walker (2 wheels) General ADL  Comments: CGA for balance. Pt delayed processing, cog impacting, high fall risk and safety concerns     Vision Baseline Vision/History: 0 No visual deficits Patient Visual Report: No change from baseline Vision Assessment?: No apparent visual deficits            Pertinent Vitals/Pain Pain Assessment Pain Assessment: Faces Faces Pain Scale: Hurts even more Pain Location: scrotum Pain Descriptors / Indicators: Discomfort Pain Intervention(s): Monitored during session     Extremity/Trunk Assessment Upper Extremity Assessment Upper Extremity Assessment: Overall WFL for tasks assessed   Lower Extremity Assessment Lower Extremity Assessment: Defer to PT evaluation   Cervical / Trunk Assessment Cervical / Trunk Assessment: Kyphotic   Communication Communication Communication: No apparent difficulties   Cognition Arousal: Alert Behavior During Therapy: WFL for tasks assessed/performed Cognition: History of cognitive impairments     OT - Cognition Comments: Pt with hx of dementia, per chart review recently eloped from home. Poor stm, ltm, initation, sequencing, organization, problem solving. High fall risk and safety concerns       Following commands: Impaired Following commands impaired: Follows one step commands with increased time     Cueing  General Comments   Cueing Techniques: Verbal cues  HR stable on RA           Home Living Family/patient expects to be discharged to:: Private residence Living Arrangements: Spouse/significant other;Children Available Help at Discharge: Family;Available PRN/intermittently Type of Home: House Home Access: Stairs to enter Entrance Stairs-Number of Steps: 4   Home Layout: Bed/bath upstairs;Two level     Bathroom Shower/Tub: Producer, television/film/video: Standard     Home Equipment: None   Additional Comments: pt lives with wife, attends [facility] center MWF      Prior Functioning/Environment Prior Level of Function  : Needs assist  Cognitive Assist : ADLs (cognitive)   ADLs (Cognitive): Intermittent cues Physical Assist : ADLs (physical)   ADLs (physical): Bathing Mobility Comments: furniture walks, crawls up stairs      OT Problem List: Decreased range of motion;Decreased strength;Decreased activity tolerance;Impaired balance (sitting and/or standing);Decreased cognition;Decreased safety awareness;Cardiopulmonary status limiting activity   OT Treatment/Interventions: Self-care/ADL training;Therapeutic exercise;Energy conservation;DME and/or AE instruction;Therapeutic activities;Patient/family education;Balance training      OT Goals(Current goals can be found in the care plan section)   Acute Rehab OT Goals Patient Stated Goal: To rest OT Goal Formulation: With patient Time For Goal Achievement: 09/10/24 Potential to Achieve Goals: Good   OT Frequency:  Min 1X/week       AM-PAC OT 6 Clicks Daily Activity     Outcome Measure Help from another person eating meals?: None Help from another person taking care of personal grooming?: A Little Help from another person toileting, which includes using toliet, bedpan, or urinal?: A Lot Help from another person bathing (including washing, rinsing, drying)?: A Lot Help from another person to put on and taking off regular upper body clothing?: A Little Help from another person to put on and taking off regular lower body clothing?: A Lot 6 Click Score: 16   End of Session Equipment Utilized During Treatment: Rolling walker (2 wheels) Nurse Communication: Mobility status  Activity Tolerance: Patient limited by fatigue Patient left: in bed;with call bell/phone within reach;with bed alarm set  OT Visit Diagnosis: Unsteadiness on feet (R26.81);Other abnormalities of gait and mobility (R26.89);Muscle weakness (generalized) (M62.81)  Time: 9093-9063 OT Time Calculation (min): 30 min Charges:  OT General Charges $OT Visit: 1  Visit OT Evaluation $OT Eval Moderate Complexity: 1 Mod OT Treatments $Self Care/Home Management : 8-22 mins  Adrianne BROCKS, OT  Acute Rehabilitation Services Office [phone] Secure chat preferred   Adrianne GORMAN Savers 08/27/2024, 11:20 AM

## 2024-08-27 NOTE — Addendum Note (Signed)
 Encounter addended by: Glena Harlene HERO, FNP on: 08/27/2024 4:26 PM  Actions taken: Clinical Note Signed

## 2024-08-27 NOTE — Assessment & Plan Note (Addendum)
 Acute exacerbation likely secondary to medication non-adherence. Echo on 9/4 with LVEF of 30-35%. Patient tolerating lasix  well, with fluid output of 2 L yesterday. Patient no longer endorsing dyspnea. On physical exam, patient appears less volume overloaded (lungs clearer to auscultation). Discussed option of palliative care with patient's daughter given historical diagnosis of dementia and HF. - Cardiology consulted, appreciate recs  - Lasix  80 mg BID IV today - Continue jardiance , coreg   - Heart healthy diet  - Cardiac telemetry - Strict ins and outs, daily weights   - AM BMP, Mag  - PT and OT starting today - Palliative care consulted, appreciate assistance - F/u on repeat echo

## 2024-08-27 NOTE — TOC Initial Note (Addendum)
 Transition of Care (TOC) - Initial/Assessment Note    Patient Details  Name: Connor Burns. MRN: [id number] Date of Birth: 06-20-1956  Transition of Care [facility]) CM/SW Contact:    Waddell Barnie Rama, RN Phone Number: 08/27/2024, 11:43 AM  Clinical Narrative:                 From home with spouse, has PCP and insurance on file, states has no HH services in place at this time , has oxygen  tank and use prn, and a walker at home.  States family member (wife_) will transport them home at [employer] and family is support system, states gets medications from [facility] in [place].  Pta self ambulatory, use walker ocassionally.    Per PT /OT eval, wife states they want HH services and not SNF.  This NCM offered choice for [facility], he states he had [facility] in the past, but [facility] is no longer in network with his insurance,   NCM made referral to [place] with [facility], which patient is ok with,  she is able to take referral for HHRN, HHPT, HHOT, HHAIDE, and SW.  Soc will begin next Tuesday,  PT also rec rollator and bsc,  Patient does not have a preference of the agency for the DME.  NCM made referral to Jerm[place] with [employer].    Expected Discharge Plan: Home w Home Health Services Barriers to Discharge: Continued Medical Work up   Patient Goals and CMS Choice Patient states their goals for this hospitalization and ongoing recovery are:: return home with [facility] CMS Medicare.gov Compare Post Acute Care list provided to:: Patient Choice offered to / list presented to : Patient      Expected Discharge Plan and Services   Discharge Planning Services: CM Consult Post Acute Care Choice: Durable Medical Equipment, Home Health Living arrangements for the past 2 months: Single Family Home                 DME Arranged: Bedside commode, Walker rolling with seat DME Agency: [employer] Date DME Agency Contacted: 08/27/24 Time DME Agency Contacted: 1141 Representative spoke with at DME Agency: [facility]  Arranged: PT, OT, Nurse's Aide, RN, Social Work [employer] Agency: [facility] Date HH Agency Contacted: 08/27/24 Time HH Agency Contacted: 1142 Representative spoke with at [facility] Agency: [place]  Prior Living Arrangements/Services Living arrangements for the past 2 months: Single Family Home Lives with:: Spouse Patient language and need for interpreter reviewed:: Yes Do you feel safe going back to the place where you live?: Yes      Need for Family Participation in Patient Care: Yes (Comment) Care giver support system in place?: Yes (comment) Current home services: DME (walker, oxygen  prn) Criminal Activity/Legal Involvement Pertinent to Current Situation/Hospitalization: No - Comment as needed  Activities of Daily Living      Permission Sought/Granted Permission sought to share information with : Case Manager Permission granted to share information with : Yes, Verbal Permission Granted     Permission granted to share info w AGENCY: HH        Emotional Assessment Appearance:: Appears stated age Attitude/Demeanor/Rapport: Engaged Affect (typically observed): Appropriate Orientation: : Oriented to Self, Oriented to Place, Oriented to  Time, Oriented to Situation Alcohol / Substance Use: Not Applicable Psych Involvement: No (comment)  Admission diagnosis:  Shortness of breath [R06.02] Hypokalemia [E87.6] Anasarca [R60.1] Pleural effusion [J90] Chronic atrial fibrillation (HCC) [I48.20] Elevated brain natriuretic peptide (BNP) level [R79.89] Acute on chronic congestive heart failure (HCC) [  I50.9] Non compliance w medication regimen [Z91.148] Acute on chronic combined systolic and diastolic CHF (congestive heart failure) (HCC) [I50.43] Stage 4 chronic kidney disease (HCC) [N18.4] Patient Active Problem List   Diagnosis Date Noted   Hyperbilirubinemia 08/27/2024   Acute on chronic congestive heart failure (HCC) 08/26/2024   Multiple open wounds of lower extremity  08/26/2024   Anasarca 08/26/2024   Non compliance w medication regimen 08/26/2024   Chronic atrial fibrillation (HCC) 08/26/2024   Elevated brain natriuretic peptide (BNP) level 08/26/2024   Pacemaker 07/12/2024   Acquired thrombophilia (HCC) 07/12/2024   Benign hypertensive heart and kidney disease with systolic CHF, NYHA class 2 and CKD stage 3 (HCC) 07/12/2024   Leg wound, right 05/24/2024   Gait instability 05/19/2024   Bilateral edema of lower extremity 02/11/2024   Wound of left leg 02/11/2024   Class 2 obesity due to excess calories with body mass index (BMI) of 36.0 to 36.9 in adult 02/11/2024   Hypertensive heart disease with chronic systolic congestive heart failure (HCC) 02/03/2024   Need for assistance with personal care 02/03/2024   Hypokalemia 10/12/2023   History of pleural effusion 10/12/2023   Class 2 obesity due to excess calories with body mass index (BMI) of 37.0 to 37.9 in adult 09/12/2023   Left arm swelling 09/12/2023   Hypoxia 09/12/2023   Atrial fibrillation, permanent (HCC) 04/05/2023   Depressed left ventricular ejection fraction 04/05/2023   Medication management 04/05/2023   Generalized weakness 03/13/2023   Mild dementia, unclear etiology 12/30/2022   Stage 4 chronic kidney disease (HCC) 10/27/2020   Myocardial infarction 10/27/2020   Shortness of breath 10/27/2020   Arthritis 10/27/2020   Stroke    Dyslipidemia 05/07/2018   Hypertension 05/07/2018   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   Congestive cardiomyopathy 01/14/2017   Coronary artery disease s/p ICD/PPM 01/14/2017   Syncope 01/14/2017   Acute on chronic combined systolic and diastolic CHF (congestive heart failure) (HCC) 01/14/2017   Ischemic cardiomyopathy 06/18/2016   Hematuria 08/10/2015   Atherosclerosis of native arteries of the extremities with ulceration 06/15/2014   Painful legs and moving toes of left foot 05/10/2014   Onychomycosis 05/10/2014    PCP:  Georgina Speaks, FNP Pharmacy:   [facility] [address] [place] - [address] [facility] [place] [address] Phone: [phone] Fax: [phone]  [facility] [address] [place] [address] Phone: [phone] Fax: [phone]  [facility] - [facility] [address] [place] [address] Phone: [phone] Fax: [phone]     Social Drivers of Health (SDOH) Social History: SDOH Screenings   Food Insecurity: No Food Insecurity (06/11/2024)   Received from [facility]  Housing: Low Risk  (06/11/2024)   Received from [facility]  Transportation Needs: No Transportation Needs (06/11/2024)   Received from [facility]  Utilities: Not At Risk (06/11/2024)   Received from [facility]  Alcohol Screen: Low Risk  (09/15/2023)  Depression (PHQ2-9): Low Risk  (07/02/2024)  Financial Resource Strain: Medium Risk (02/19/2024)  Tobacco Use: Low Risk  (08/26/2024)  Health Literacy: Adequate Health Literacy (02/19/2024)   SDOH Interventions:     Readmission Risk Interventions    08/27/2024   11:39 AM  Readmission Risk Prevention Plan  Transportation Screening Complete  HRI or Home Care Consult Complete  Palliative Care Screening Not Applicable  Medication Review (RN Care Manager) Complete

## 2024-08-27 NOTE — Evaluation (Signed)
 Physical Therapy Evaluation Patient Details Name: Connor Burns. MRN: [id number] DOB: [birth year] Today's Date: 08/27/2024  History of Present Illness  68 yo male adm 08/26/24 with SOB, LB edema, CHF exacerbation due to medication nonadherence. PMHx: CAD, CHF, ICM, ICD, Afib, CKD, HLD, dementia, CVA  Clinical Impression  Pt pleasantly confused, initially stating need to get to bathroom but then once standing stated he didn't need to void. Pt with decreased STM, safety, awareness and problem solving. Pt lives with wife who works as a Runner, broadcasting/film/video and he attends senior center [employer]. Discussed with wife safety concerns for pt cognition with need for increased supervision, cameras, family/friend support to check in and DME. Wife reports ALF and SNF are not options but she is open to [facility] services. Pt has to ascend/descend flight of stairs at home and wife reports no falls. Pt with decreased cognition, balance, strength and function who will benefit from acute therapy to maximize mobility and safety as well as max HH services.         If plan is discharge home, recommend the following: A little help with walking and/or transfers;A little help with bathing/dressing/bathroom;Assistance with cooking/housework;Direct supervision/assist for financial management;Supervision due to cognitive status;Assist for transportation;Help with stairs or ramp for entrance   Can travel by private vehicle        Equipment Recommendations Rollator (4 wheels);BSC/3in1, safety cameras and life alert type system  Recommendations for Other Services  OT consult    Functional Status Assessment Patient has had a recent decline in their functional status and demonstrates the ability to make significant improvements in function in a reasonable and predictable amount of time.     Precautions / Restrictions Precautions Precautions: Fall      Mobility  Bed Mobility Overal bed mobility: Needs Assistance Bed Mobility: Supine to Sit      Supine to sit: Supervision, Used rails     General bed mobility comments: HOb 15 degrees, increased time and effort    Transfers Overall transfer level: Needs assistance   Transfers: Sit to/from Stand Sit to Stand: Supervision           General transfer comment: pt impulsive at times with standing needing cues for hand placement and safety to stand from bed and BSC. Pt pivoted bed to [facility] with min assist and mod cues for sequence and hand placement    Ambulation/Gait Ambulation/Gait assistance: Contact guard assist Gait Distance (Feet): 20 Feet Assistive device: Rolling walker (2 wheels) Gait Pattern/deviations: Step-through pattern, Decreased stride length, Trunk flexed   Gait velocity interpretation: <1.8 ft/sec, indicate of risk for recurrent falls   General Gait Details: cues for proximity to RW, posture, direction and safety  Stairs            Wheelchair Mobility     Tilt Bed    Modified Rankin (Stroke Patients Only)       Balance Overall balance assessment: Needs assistance Sitting-balance support: No upper extremity supported, Feet supported Sitting balance-Leahy Scale: Fair     Standing balance support: Single extremity supported, Bilateral upper extremity supported Standing balance-Leahy Scale: Poor Standing balance comment: pt reaching out for support with all standing trials                             Pertinent Vitals/Pain Pain Assessment Pain Assessment: No/denies pain    Home Living Family/patient expects to be discharged to:: Private residence Living Arrangements: Spouse/significant other;Children Available Help at  Discharge: Family;Available PRN/intermittently Type of Home: House Home Access: Stairs to enter   Entrance Stairs-Number of Steps: 4   Home Layout: Bed/bath upstairs;Two level Home Equipment: None Additional Comments: pt lives with wife, attends [facility] center MWF    Prior Function Prior Level of Function :  Needs assist  Cognitive Assist : ADLs (cognitive)   ADLs (Cognitive): Intermittent cues Physical Assist : ADLs (physical)   ADLs (physical): Bathing Mobility Comments: furniture walks, crawls up stairs       Extremity/Trunk Assessment   Upper Extremity Assessment Upper Extremity Assessment: Overall WFL for tasks assessed    Lower Extremity Assessment Lower Extremity Assessment: Generalized weakness    Cervical / Trunk Assessment Cervical / Trunk Assessment: Kyphotic  Communication   Communication Communication: No apparent difficulties    Cognition Arousal: Alert Behavior During Therapy: WFL for tasks assessed/performed   PT - Cognitive impairments: Orientation, Awareness, Memory, Safety/Judgement, Problem solving                       PT - Cognition Comments: pt oriented to self, able to select hospital as location when given 3 choices. States time as Feb 2023 Pt with STM limited to grossly 2 min Following commands: Intact       Cueing Cueing Techniques: Verbal cues     General Comments      Exercises     Assessment/Plan    PT Assessment Patient needs continued PT services  PT Problem List Decreased activity tolerance;Decreased balance;Decreased cognition;Decreased mobility       PT Treatment Interventions Gait training;DME instruction;Stair training;Functional mobility training;Therapeutic activities;Patient/family education    PT Goals (Current goals can be found in the Care Plan section)  Acute Rehab PT Goals Patient Stated Goal: wife states goal as pt returning home and being more active PT Goal Formulation: With family Time For Goal Achievement: 09/10/24 Potential to Achieve Goals: Fair    Frequency Min 1X/week     Co-evaluation               AM-PAC PT 6 Clicks Mobility  Outcome Measure Help needed turning from your back to your side while in a flat bed without using bedrails?: A Little Help needed moving from lying on  your back to sitting on the side of a flat bed without using bedrails?: A Little Help needed moving to and from a bed to a chair (including a wheelchair)?: A Little Help needed standing up from a chair using your arms (e.g., wheelchair or bedside chair)?: A Little Help needed to walk in hospital room?: A Little Help needed climbing 3-5 steps with a railing? : A Lot 6 Click Score: 17    End of Session Equipment Utilized During Treatment: Gait belt Activity Tolerance: Patient tolerated treatment well Patient left: in chair;with call bell/phone within reach;with chair alarm set Nurse Communication: Mobility status PT Visit Diagnosis: Other abnormalities of gait and mobility (R26.89);Difficulty in walking, not elsewhere classified (R26.2)    Time: 9251-9181 PT Time Calculation (min) (ACUTE ONLY): 30 min   Charges:   PT Evaluation $PT Eval Moderate Complexity: 1 Mod PT Treatments $Therapeutic Activity: 8-22 mins PT General Charges $$ ACUTE PT VISIT: 1 Visit         Lenoard SQUIBB, PT Acute Rehabilitation Services Office: [phone]   Lenoard NOVAK Cambre [place] 08/27/2024, 9:21 AM

## 2024-08-28 ENCOUNTER — Inpatient Hospital Stay (HOSPITAL_COMMUNITY)

## 2024-08-28 DIAGNOSIS — Z7189 Other specified counseling: Secondary | ICD-10-CM

## 2024-08-28 DIAGNOSIS — I5043 Acute on chronic combined systolic (congestive) and diastolic (congestive) heart failure: Secondary | ICD-10-CM | POA: Diagnosis not present

## 2024-08-28 DIAGNOSIS — J90 Pleural effusion, not elsewhere classified: Secondary | ICD-10-CM

## 2024-08-28 DIAGNOSIS — I5020 Unspecified systolic (congestive) heart failure: Secondary | ICD-10-CM

## 2024-08-28 DIAGNOSIS — R601 Generalized edema: Secondary | ICD-10-CM | POA: Diagnosis not present

## 2024-08-28 DIAGNOSIS — Z515 Encounter for palliative care: Secondary | ICD-10-CM

## 2024-08-28 DIAGNOSIS — F03A0 Unspecified dementia, mild, without behavioral disturbance, psychotic disturbance, mood disturbance, and anxiety: Secondary | ICD-10-CM

## 2024-08-28 DIAGNOSIS — R7989 Other specified abnormal findings of blood chemistry: Secondary | ICD-10-CM | POA: Diagnosis not present

## 2024-08-28 DIAGNOSIS — Z789 Other specified health status: Secondary | ICD-10-CM

## 2024-08-28 LAB — COMPREHENSIVE METABOLIC PANEL WITH GFR
ALT: 17 U/L (ref 0–44)
AST: 30 U/L (ref 15–41)
Albumin: 2.9 g/dL — ABNORMAL LOW (ref 3.5–5.0)
Alkaline Phosphatase: 173 U/L — ABNORMAL HIGH (ref 38–126)
Anion gap: 13 (ref 5–15)
BUN: 29 mg/dL — ABNORMAL HIGH (ref 8–23)
CO2: 28 mmol/L (ref 22–32)
Calcium: 9 mg/dL (ref 8.9–10.3)
Chloride: 98 mmol/L (ref 98–111)
Creatinine, Ser: 2.28 mg/dL — ABNORMAL HIGH (ref 0.61–1.24)
GFR, Estimated: 30 mL/min — ABNORMAL LOW (ref >60)
Glucose, Bld: 94 mg/dL (ref 70–99)
Potassium: 3.2 mmol/L — ABNORMAL LOW (ref 3.5–5.1)
Sodium: 139 mmol/L (ref 135–145)
Total Bilirubin: 8.4 mg/dL — ABNORMAL HIGH (ref 0.0–1.2)
Total Protein: 6.7 g/dL (ref 6.5–8.1)

## 2024-08-28 LAB — MAGNESIUM: Magnesium: 2.1 mg/dL (ref 1.7–2.4)

## 2024-08-28 MED ORDER — POTASSIUM CHLORIDE CRYS ER 20 MEQ PO TBCR
40.0000 meq | EXTENDED_RELEASE_TABLET | ORAL | Status: AC
  Administered 2024-08-28 (×2): 40 meq via ORAL
  Filled 2024-08-28 (×2): qty 2

## 2024-08-28 NOTE — Assessment & Plan Note (Signed)
 LE wounds/venous stasis ulcers: during hospitalization continue silver  sulfadiazine  BID w/ dressing changes; follows with wound care outpt HLD: continue home atorvastatin  20 mg daily Dementia: continue aricept  10 mg daily, melatonin 3 mg and risperdal  0.5 mg at bedtime

## 2024-08-28 NOTE — Progress Notes (Incomplete)
     Daily Progress Note Intern Pager: [phone]  Patient name: Connor Burns. Medical record number: [id number] Date of birth: Oct 18, 1956 Age: 68 y.o. Gender: male  Primary Care Provider: Georgina Speaks, FNP Consultants: Cardiology Code Status: FULL  Pt Overview and Major Events to Date:  9/4 - admitted to FMTS  Connor Burns is a 68 year old male who presented with shortness of breath and edema, found to have acute on chronic heart failure exacerbation.  Medical history includes CAD, permanent A-fib, CHF secondary to ischemic cardiomyopathy, ICD placement, stage IV CKD, dementia, prior stroke.  Assessment & Plan Acute on chronic combined systolic and diastolic CHF (congestive heart failure) (HCC) Appears euvolemic this AM. He continues to respond well to Lasix  dose. - Likely transition to PO diuretics today - Continue Jardiance  10 mg daily, coreg  3.125 mg BID - Cardiology following, appreciate recs - Strict I/Os, daily weights - AM CMP, Mag - PT and OT - Palliative care consulted, appreciate assistance Hypokalemia Resolved this AM. - AM CMP Hyperbilirubinemia RUQ US  yesterday was negative. Total bilirubin decreased from 8.4 to 7.7 today. Alk phos remains elevated at 170.  Pt has prior cholecystectomy. Still unclear etiology of his hyperbilirubinemia, which appears to be of a chronic nature.  - if family wishes to pursue additional workup MRCP may be needed; should discuss during Palliative meeting today as able - AM CMP Chronic health problem LE wounds/venous stasis ulcers: during hospitalization continue silver  sulfadiazine  BID w/ dressing changes; follows with wound care outpt HLD: continue home atorvastatin  20 mg daily Dementia: continue aricept  10 mg daily, melatonin 3 mg and risperdal  0.5 mg at bedtime  FEN/GI: heart healthy diet PPx: home Eliquis  5 mg BID Dispo:Pending PT recommendations  and palliative discussion later today.   Subjective:  Lying in bed  comfortably, awakens gently and denies discomfort or any other needs at this time. Goes back to sleep.  Objective: Temp:  [97.6 F (36.4 C)-97.9 F (36.6 C)] 97.7 F (36.5 C) (09/06 1950) Pulse Rate:  [8-91] 77 (09/06 2200) Resp:  [12-25] 20 (09/06 1950) BP: (119-138)/(63-79) 138/79 (09/06 1950) SpO2:  [85 %-98 %] 96 % (09/06 2200) Weight:  [133.8 kg] 133.8 kg (09/06 0441) Physical Exam: General: sleeping comfortably, awakens easily, NAD Cardiovascular: RRR, no murmurs Respiratory: CTA bilaterally, no increased WOB Abdomen: soft, nontender Extremities: trace edema bilaterally  Laboratory: Most recent CBC Lab Results  Component Value Date   WBC 5.5 08/26/2024   HGB 12.9 (L) 08/26/2024   HCT 38.0 (L) 08/26/2024   MCV 85.6 08/26/2024   PLT 158 08/26/2024   Most recent BMP    Latest Ref Rng & Units 08/28/2024    2:42 AM  BMP  Glucose 70 - 99 mg/dL 94   BUN 8 - 23 mg/dL 29   Creatinine 9.38 - 1.24 mg/dL 7.71   Sodium 864 - 854 mmol/L 139   Potassium 3.5 - 5.1 mmol/L 3.2   Chloride 98 - 111 mmol/L 98   CO2 22 - 32 mmol/L 28   Calcium  8.9 - 10.3 mg/dL 9.0    Magnesium  2.1  Connor Domino, DO 08/28/2024, 10:48 PM  PGY-2, [facility] FPTS Intern pager: [phone], text pages welcome Secure chat group [facility] Teaching Service

## 2024-08-28 NOTE — Progress Notes (Signed)
 Daily Progress Note Intern Pager: [phone]  Patient name: Connor Burns. Medical record number: [id number] Date of birth: March 06, 1956 Age: 68 y.o. Gender: male  Primary Care Provider: Georgina Speaks, FNP Consultants: Cardiology Code Status: Full - confirmed with patient's daughter  Pt Overview and Major Events to Date:  9/4 - Admitted  Reason for Admission:  Connor Burns is a 68 year old male presenting with shortness of breath and edema, found to have acute on chronic heart failure exacerbation. Pertinent PMH/PSH includes CAD, permanent A-fib, CHF secondary to ischemic cardiomyopathy, ICD placement, stage IV CKD, dementia, prior stroke.  Assessment & Plan Acute on chronic combined systolic and diastolic CHF (congestive heart failure) (HCC) Patient's fluid status improving. Lungs are clear to auscultation bilaterally on physical exam, however patient does still have bilateral lower extremity edema, suggesting need for continued IV diuresis. He continues to respond well to Lasix  dose, and had 1.9 L of fluid output yesterday. Given improvement in fluid status, we are anticipating being able to transition to PO diuretics tomorrow.  - Lasix  80 mg BID IV today - Continue jardiance , coreg  - Cardiology following, appreciate recs - Strict I/Os, daily weights - AM CMP, Mag - PT and OT - Palliative care consulted, appreciate assistance Hypokalemia Likely related to diuresis, although appears to have ongoing hypokalemia despite repeated supplementation. Overnight, patient's K was 3.2, and he was given 40 mEq x 2. Will recheck in the AM. - Repleted overnight - F/u AM BMP and replete as necessary  Multiple open wounds of lower extremity Follows with wound care clinic outpatient; has multiple venous stasis ulcers. Stable. - WOC consulted  - Continue silver  sulfadiazine  BID with dressing changes Hyperbilirubinemia Total bilirubin increased from 7.0 to 8.4 today. Alk phos elevated at 173.   Pt has prior cholecystectomy. Unclear etiology of his hyperbilirubinemia, which appears to be of a chronic nature. Will pursue imaging for further evaluation. -RUQ ultrasound today      Chronic and Stable Issues: Afib: continue coreg , continue BID eliquis   Dementia: continue donepezil  and risperidone     FEN/GI: heart healthy diet PPx: on home Eliquis  Dispo:Pending PT recommendations  and discussion with family. Barriers include ongoing need for medical management.   Subjective:  Patient seen and evaluated at bedside. On assessment today, patient is alert, but oriented to person only. He believes we are in [place] and the year is 1925. Denies chest pain. Denies dyspnea at rest, but states she does have shortness of breath with exertion.    Objective: Temp:  [97.6 F (36.4 C)-97.9 F (36.6 C)] 97.6 F (36.4 C) (09/06 0441) Pulse Rate:  [81-82] 81 (09/06 0441) Resp:  [19-25] 19 (09/06 0441) BP: (111-134)/(50-66) 134/63 (09/06 0441) SpO2:  [85 %-98 %] 93 % (09/06 0441) Weight:  [133.8 kg] 133.8 kg (09/06 0441)  Physical Exam: General: alert, lying in bed, NAD Cardiovascular: Irregular rate and rhythm Respiratory: normal breath sounds, CTAB; no wheezing, crackles Abdomen: Distended, soft, non-tender, normoactive bowel sounds Extremities: Bilateral lower extremity edema; lower legs wrapped in bandages  Laboratory: Most recent CBC Lab Results  Component Value Date   WBC 5.5 08/26/2024   HGB 12.9 (L) 08/26/2024   HCT 38.0 (L) 08/26/2024   MCV 85.6 08/26/2024   PLT 158 08/26/2024   Most recent BMP    Latest Ref Rng & Units 08/28/2024    2:42 AM  BMP  Glucose 70 - 99 mg/dL 94   BUN 8 - 23 mg/dL 29   Creatinine 9.38 -  1.24 mg/dL 7.71   Sodium 864 - 854 mmol/L 139   Potassium 3.5 - 5.1 mmol/L 3.2   Chloride 98 - 111 mmol/L 98   CO2 22 - 32 mmol/L 28   Calcium  8.9 - 10.3 mg/dL 9.0     Other pertinent labs: Alk Phos 173; total bili 8.4  Imaging/Diagnostic  Tests:  Echocardiogram (08/26/24)  Radiologist Impression:  1. LV not seen well (see comment below). There is LV dyssynchrony due to  LBBB. Left ventricular ejection fraction, by estimation, is 30 to 35%. The left ventricle has moderately decreased function. Left ventricular endocardial border not optimally defined to evaluate regional wall motion. There is mild concentric left ventricular hypertrophy. Left ventricular diastolic parameters are indeterminate.   2. Right ventricular systolic function is normal. The right ventricular size is normal.   3. Left atrial size was mildly dilated.   4. The mitral valve is normal in structure. Trivial mitral valve regurgitation. No evidence of mitral stenosis.   5. The aortic valve is tricuspid. There is moderate calcification of the aortic valve. Aortic valve regurgitation is not visualized. Aortic valve sclerosis/calcification is present, without any evidence of aortic stenosis.   6. The inferior vena cava is dilated in size with <50% respiratory variability, suggesting right atrial pressure of 15 mmHg.   Connor [name] SAILOR, MD 08/28/2024, 12:43 PM  PGY-1, [facility] FPTS Intern pager: [phone], text pages welcome Secure chat group [facility] Teaching Service

## 2024-08-28 NOTE — Progress Notes (Signed)
 Progress Note  Patient Name: Connor Burns. Date of Encounter: 08/28/2024  Primary Cardiologist: [name], DO  Interval Summary  Chart reviewed including follow-up by Dr. Raford yesterday.  States that he feels a little better.  No chest pain or shortness of breath at rest.  Net urine output of approximately 1900 cc last 24 hours.  Renal function relatively stable.  Vital Signs  Vitals:   08/27/24 1940 08/27/24 1947 08/28/24 0024 08/28/24 0441  BP: (!) 111/50 (!) 111/50 129/66 134/63  Pulse:  82 81 81  Resp:  (!) 23 (!) 25 19  Temp:  97.9 F (36.6 C) 97.9 F (36.6 C) 97.6 F (36.4 C)  TempSrc:  Oral Oral Oral  SpO2:  98% (!) 85% 93%  Weight:    133.8 kg    Intake/Output Summary (Last 24 hours) at 08/28/2024 0952 Last data filed at 08/28/2024 0442 Gross per 24 hour  Intake 240 ml  Output 2150 ml  Net -1910 ml   Filed Weights   08/26/24 1800 08/27/24 0442 08/28/24 0441  Weight: (!) 136.7 kg 134.1 kg 133.8 kg    Physical Exam  GEN: No acute distress.   Neck: No JVD. Cardiac: Irregularly irregular without gallop.  Respiratory: Nonlabored. Clear to auscultation bilaterally. GI: Soft, bowel sounds present. MS: Legs wrapped.  ECG/Telemetry  Telemetry reviewed showing atrial fibrillation.  Labs  Chemistry Recent Labs  Lab 08/26/24 [phone] 08/26/24 0611 08/26/24 0845 08/27/24 0025 08/28/24 0242  NA 142 139  --  137 139  K 3.4* 3.2*  --  3.2* 3.2*  CL 99 101  --  98 98  CO2 25  --   --  27 28  GLUCOSE 88 84  --  152* 94  BUN 29* 29*  --  31* 29*  CREATININE 2.28* 2.40*  --  2.47* 2.28*  CALCIUM  9.2  --   --  8.6* 9.0  PROT  --   --  7.0 6.3* 6.7  ALBUMIN  --   --  3.0* 2.7* 2.9*  AST  --   --  47* 30 30  ALT  --   --  19 18 17   ALKPHOS  --   --  187* 178* 173*  BILITOT  --   --  8.7* 7.0* 8.4*  GFRNONAA 30*  --   --  28* 30*  ANIONGAP 18*  --   --  12 13    Hematology Recent Labs  Lab 08/26/24 0607 08/26/24 0611  WBC 5.5  --   RBC 4.18*   --   HGB 11.8* 12.9*  HCT 35.8* 38.0*  MCV 85.6  --   MCH 28.2  --   MCHC 33.0  --   RDW 16.9*  --   PLT 158  --    Cardiac Enzymes Recent Labs  Lab 08/26/24 0607 08/26/24 0845  TROPONINIHS 46* 41*   Lipid Panel     Component Value Date/Time   CHOL 137 05/19/2024 1628   TRIG 56 05/19/2024 1628   HDL 56 05/19/2024 1628   CHOLHDL 2.4 05/19/2024 1628   LDLCALC 69 05/19/2024 1628   LABVLDL 12 05/19/2024 1628    Cardiac Studies  Echocardiogram 08/26/2024:  1. LV not seen well (see comment below). There is LV dyssynchrony due to  LBBB. Left ventricular ejection fraction, by estimation, is 30 to 35%. The  left ventricle has moderately decreased function. Left ventricular  endocardial border not optimally  defined to evaluate regional wall motion. There  is mild concentric left  ventricular hypertrophy. Left ventricular diastolic parameters are  indeterminate.   2. Right ventricular systolic function is normal. The right ventricular  size is normal.   3. Left atrial size was mildly dilated.   4. The mitral valve is normal in structure. Trivial mitral valve  regurgitation. No evidence of mitral stenosis.   5. The aortic valve is tricuspid. There is moderate calcification of the  aortic valve. Aortic valve regurgitation is not visualized. Aortic valve  sclerosis/calcification is present, without any evidence of aortic  stenosis.   6. The inferior vena cava is dilated in size with <50% respiratory  variability, suggesting right atrial pressure of 15 mmHg.   Assessment & Plan  1.  Acute on chronic HFrEF, LVEF 35 to 40% with normal RV contraction.  GDMT limited by CKD stage IIIb-IV, currently focusing on optimizing fluid status.  He continues to diurese on IV Lasix  80 mg twice daily with potassium supplement.  Also on Jardiance  10 mg daily and Coreg  3.125 mg twice daily.  As discussed previously in the chart, not candidate for advanced therapies.  Palliative care consultation  pending as well.  2.  Primary hypertension.  3.  Lower extremity wounds.  Venous stasis ulcers.  Wound care consulted.  4.  CAD status post CABG.  Minor elevation in high-sensitivity troponin I most consistent with demand ischemia rather than ACS.  No active angina.  He is on Lipitor 20 mg daily, no aspirin  given use of Eliquis .  5.  Persistent atrial fibrillation status post failed ablation with plan for heart rate control.  Continue Eliquis  5 mg twice daily and Coreg  3.125 mg twice daily.  6.  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  7.  CKD stage IIIb-IV, creatinine 2.28 with GFR 30.  Continue present diuretic regimen with Lasix  80 mg twice daily and potassium supplement.  Renal function tolerating present course of fluid removal.  He is also on Jardiance  10 mg daily and Coreg  3.125 mg twice daily.  Palliative care consultation pending for GOC discussion.  Not candidate for advanced therapies as previously discussed.  For questions or updates, please contact [facility] Please consult [email or web address] for contact info under   Signed, Jayson Sierras, MD  08/28/2024, 9:52 AM

## 2024-08-28 NOTE — Assessment & Plan Note (Signed)
 Appears euvolemic this AM. He continues to respond well to Lasix  dose. - Likely transition to PO diuretics today - Continue Jardiance  10 mg daily, coreg  3.125 mg BID - Cardiology following, appreciate recs - Strict I/Os, daily weights - AM CMP, Mag - PT and OT - Palliative care consulted, appreciate assistance

## 2024-08-28 NOTE — Assessment & Plan Note (Signed)
 Total bilirubin increased from 7.0 to 8.4 today. Alk phos elevated at 173.  Pt has prior cholecystectomy. Unclear etiology of his hyperbilirubinemia, which appears to be of a chronic nature. Will pursue imaging for further evaluation. -RUQ ultrasound today

## 2024-08-28 NOTE — Assessment & Plan Note (Signed)
 Patient's fluid status improving. Lungs are clear to auscultation bilaterally on physical exam, however patient does still have bilateral lower extremity edema, suggesting need for continued IV diuresis. He continues to respond well to Lasix  dose, and had 1.9 L of fluid output yesterday. Given improvement in fluid status, we are anticipating being able to transition to PO diuretics tomorrow.  - Lasix  80 mg BID IV today - Continue jardiance , coreg  - Cardiology following, appreciate recs - Strict I/Os, daily weights - AM CMP, Mag - PT and OT - Palliative care consulted, appreciate assistance

## 2024-08-28 NOTE — Assessment & Plan Note (Signed)
 RUQ US  yesterday was negative. Total bilirubin decreased from 8.4 to 7.7 today. Alk phos remains elevated at 170.  Pt has prior cholecystectomy. Still unclear etiology of his hyperbilirubinemia, which appears to be of a chronic nature.  - if family wishes to pursue additional workup MRCP may be needed; should discuss during Palliative meeting today as able - AM CMP

## 2024-08-28 NOTE — Consult Note (Signed)
 Consultation Note Date: 08/28/2024   Patient Name: Connor Burns.  DOB: Sep 12, 1956  MRN: [id number]  Age / Sex: 68 y.o., male  PCP: Georgina Speaks, FNP Referring Physician: Donah Laymon PARAS, MD  Reason for Consultation: Establishing goals of care  HPI/Patient Profile: 68 y.o. male  with past medical history of CAD, MI/CABG in 2005, CHF secondary to ischemic cardiomyopathy, ICD placement, prior DVT, permanent A-fib, stage IV CKD, hyperlipidemia, dementia, prior stroke, DVT with leg wounds, admitted on 08/26/2024 with shortness of breath and edema.   Patient is admitted with acute on chronic heart failure with reduced ejection fraction.  Also has hypokalemia and hyperbilirubinemia.  PMT has been consulted to assist with goals of care conversation.  Clinical Assessment and Goals of Care:  I have reviewed medical records including EPIC notes, labs and imaging, discussed with RN, assessed the patient and then had a phone conversation patient's wife to discuss diagnosis prognosis, GOC, EOL wishes, disposition and options.  I introduced Palliative Medicine as specialized medical care for people living with serious illness. It focuses on providing relief from the symptoms and stress of a serious illness. The goal is to improve quality of life for both the patient and the family.  We discussed a brief life review of the patient and then focused on their current illness.   I attempted to elicit values and goals of care important to the patient.    Medical History Review and Understanding:  [name] and I briefly discussed patient's acute illness in the context of their chronic comorbidities.   Social History: Patient tells me he has been married for over 40 years and he has 3 daughters.  He lives with his wife.  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  Palliative Symptoms: Patient denies symptoms today  Advance Directives: A detailed  discussion regarding advanced directives was had.  No documentation currently on file.  [name] is interested in completion of advance directives if patient is able during this hospitalization.  We discussed the importance of determining his decision making capacity and what the process of completing with notary entails.  Discussion: [name] shares that she left earlier this morning after staying overnight and will not be back until later tonight to stay overnight again.  She is not able to meet during the week due to her 7-3 work schedule.  She is available and open to meeting tomorrow to discuss goals of care, anticipatory care needs, and complex medical decision making.  Her hope for additional equipment in the home, more physical therapy for patient, and improvement of heart and kidney disease.  She would like to have him participate and discussions and decision making is much as he is able.    Discussed the importance of continued conversation with family and the medical providers regarding overall plan of care and treatment options, ensuring decisions are within the context of the patient's values and GOCs.   Questions and concerns were addressed. The family was encouraged to call with questions or concerns.  PMT will continue to support holistically.    SUMMARY OF RECOMMENDATIONS   - Wife's goal is to stabilize patient's heart and kidney disease with appropriate medication regimen that also allows for quality of life and participation in adult day care - Family meeting to discuss goals of care scheduled with patient's wife at 8:30 AM on Sunday 9/7 - PMT continues to follow and support  Prognosis:  Poor long-term prognosis  Discharge Planning: To Be Determined      Primary Diagnoses: Present on Admission:  Acute on chronic combined systolic and diastolic CHF (congestive heart failure) (HCC)  Atrial fibrillation, permanent (HCC)  Stage 4 chronic kidney disease (HCC)   Hypertension  Mild dementia, unclear etiology  Wound of left leg  Coronary artery disease s/p ICD/PPM  Hypokalemia  Multiple open wounds of lower extremity    Physical Exam Vitals and nursing note reviewed.  Constitutional:      General: He is not in acute distress. HENT:     Head: Normocephalic and atraumatic.  Cardiovascular:     Rate and Rhythm: Normal rate.  Skin:    General: Skin is warm and dry.  Neurological:     Mental Status: He is alert. Mental status is at baseline.  Psychiatric:        Mood and Affect: Mood normal.        Behavior: Behavior normal.     Vital Signs: BP 134/63 (BP Location: Left Arm)   Pulse 81   Temp 97.6 F (36.4 C) (Oral)   Resp 19   Wt 133.8 kg   SpO2 93%   BMI 37.87 kg/m  Pain Scale: 0-10   Pain Score: 0-No pain   SpO2: SpO2: 93 % O2 Device:SpO2: 93 % O2 Flow Rate: .    Total time: I spent 60 minutes in the care of the patient today in the above activities and documenting the encounter.    Kamen Hanken P Tashawna Thom, PA-C  Palliative Medicine Team Team phone # [phone]  Thank you for allowing the Palliative Medicine Team to assist in the care of this patient. Please utilize secure chat with additional questions, if there is no response within 30 minutes please call the above phone number.  Palliative Medicine Team providers are available by phone from 7am to 7pm daily and can be reached through the team cell phone.  Should this patient require assistance outside of these hours, please call the patient's attending physician.

## 2024-08-28 NOTE — Assessment & Plan Note (Signed)
 Follows with wound care clinic outpatient; has multiple venous stasis ulcers. Stable. - WOC consulted  - Continue silver  sulfadiazine  BID with dressing changes

## 2024-08-28 NOTE — Assessment & Plan Note (Signed)
 Resolved this AM. - AM CMP

## 2024-08-28 NOTE — Assessment & Plan Note (Signed)
 Likely related to diuresis, although appears to have ongoing hypokalemia despite repeated supplementation. Overnight, patient's K was 3.2, and he was given 40 mEq x 2. Will recheck in the AM. - Repleted overnight - F/u AM BMP and replete as necessary

## 2024-08-29 DIAGNOSIS — I5043 Acute on chronic combined systolic (congestive) and diastolic (congestive) heart failure: Secondary | ICD-10-CM | POA: Diagnosis not present

## 2024-08-29 DIAGNOSIS — R7989 Other specified abnormal findings of blood chemistry: Secondary | ICD-10-CM | POA: Diagnosis not present

## 2024-08-29 DIAGNOSIS — R601 Generalized edema: Secondary | ICD-10-CM | POA: Diagnosis not present

## 2024-08-29 DIAGNOSIS — I5020 Unspecified systolic (congestive) heart failure: Secondary | ICD-10-CM | POA: Diagnosis not present

## 2024-08-29 DIAGNOSIS — N184 Chronic kidney disease, stage 4 (severe): Secondary | ICD-10-CM | POA: Diagnosis not present

## 2024-08-29 LAB — COMPREHENSIVE METABOLIC PANEL WITH GFR
ALT: 16 U/L (ref 0–44)
AST: 27 U/L (ref 15–41)
Albumin: 2.6 g/dL — ABNORMAL LOW (ref 3.5–5.0)
Alkaline Phosphatase: 170 U/L — ABNORMAL HIGH (ref 38–126)
Anion gap: 15 (ref 5–15)
BUN: 28 mg/dL — ABNORMAL HIGH (ref 8–23)
CO2: 26 mmol/L (ref 22–32)
Calcium: 8.7 mg/dL — ABNORMAL LOW (ref 8.9–10.3)
Chloride: 97 mmol/L — ABNORMAL LOW (ref 98–111)
Creatinine, Ser: 2.26 mg/dL — ABNORMAL HIGH (ref 0.61–1.24)
GFR, Estimated: 31 mL/min — ABNORMAL LOW (ref >60)
Glucose, Bld: 93 mg/dL (ref 70–99)
Potassium: 3.7 mmol/L (ref 3.5–5.1)
Sodium: 138 mmol/L (ref 135–145)
Total Bilirubin: 7.7 mg/dL — ABNORMAL HIGH (ref 0.0–1.2)
Total Protein: 6.2 g/dL — ABNORMAL LOW (ref 6.5–8.1)

## 2024-08-29 LAB — MAGNESIUM: Magnesium: 2.1 mg/dL (ref 1.7–2.4)

## 2024-08-29 MED ORDER — TORSEMIDE 20 MG PO TABS
80.0000 mg | ORAL_TABLET | Freq: Two times a day (BID) | ORAL | Status: DC
  Administered 2024-08-29 – 2024-09-01 (×7): 80 mg via ORAL
  Filled 2024-08-29 (×7): qty 4

## 2024-08-29 NOTE — Plan of Care (Signed)

## 2024-08-29 NOTE — Progress Notes (Signed)
 Mobility Specialist Progress Note:    08/29/24 1535  Mobility  Activity Dangled on edge of bed;Ambulated with assistance;Stood at bedside  Level of Assistance Moderate assist, patient does 50-74%  Assistive Device Front wheel walker  Distance Ambulated (ft) 15 ft  Activity Response Tolerated fair;RN notified  Mobility Referral Yes  Mobility visit 1 Mobility  Mobility Specialist Start Time (ACUTE ONLY) 1535  Mobility Specialist Stop Time (ACUTE ONLY) 1600  Mobility Specialist Time Calculation (min) (ACUTE ONLY) 25 min   Pt tired but agreeable to session. Pt needs to be pushed and motivated to move. C/o of being tired and lower leg pain. Once pt was more awake sitting on EOB, he participated more. Pt able to ambulate to sink and back to bed. Encouraged pt to do bed level exercises to keep moving when he was not able to ambulate. Pt more alert and in good spirits after. Returned pt to bed w/ all needs met, RN, and family in room.   Venetia Keel Mobility Specialist Please Neurosurgeon or Rehab Office at [phone]

## 2024-08-29 NOTE — Progress Notes (Signed)
 Daily Progress Note   Patient Name: Connor Burns.       Date: 08/29/2024 DOB: 25-Apr-1956  Age: 68 y.o. MRN#: [id number] Attending Physician: Donah Laymon PARAS, MD Primary Care Physician: Georgina Speaks, FNP Admit Date: 08/26/2024  Reason for Consultation/Follow-up: Establishing goals of care  Subjective: Medical records reviewed including progress notes, labs, imaging. Patient assessed at the bedside. Discussed with RN.  He denied pain or distress, in good spirits.  RN received call from patient's wife requesting to move 830 family meeting to 71.  However, she did not arrive at the later time either.    I attempted to call patient's wife but was unable to reach.  I left her a voicemail with PMT contact information and a request to return my call to reschedule meeting or discuss goals of care by phone.  Questions and concerns addressed. PMT will continue to support holistically.   Length of Stay: 3   Physical Exam Vitals and nursing note reviewed.  Constitutional:      General: He is not in acute distress. HENT:     Head: Normocephalic and atraumatic.  Cardiovascular:     Rate and Rhythm: Normal rate.  Pulmonary:     Effort: Pulmonary effort is normal.  Neurological:     Mental Status: He is alert. Mental status is at baseline.  Psychiatric:        Mood and Affect: Mood normal.        Behavior: Behavior normal.             Vital Signs: BP 118/76 (BP Location: Left Arm)   Pulse 67   Temp 98 F (36.7 C) (Oral)   Resp 16   Wt 133.8 kg   SpO2 96%   BMI 37.87 kg/m  SpO2: SpO2: 96 % O2 Device: O2 Device: Room Air O2 Flow Rate:        Palliative Care Assessment & Plan   Patient Profile: 68 y.o. male  with past medical history of CAD, MI/CABG in 2005, CHF secondary to ischemic cardiomyopathy, ICD placement, prior DVT,  permanent A-fib, stage IV CKD, hyperlipidemia, dementia, prior stroke, DVT with leg wounds, admitted on 08/26/2024 with shortness of breath and edema.    Patient is admitted with acute on chronic heart failure with reduced ejection fraction.  Also has hypokalemia and hyperbilirubinemia.   PMT has been consulted to assist with goals of care conversation.  Assessment: Goals of care conversation Acute on chronic HFrEF Venous stasis ulcers CKD 3B to 4 ICD A-fib on Eliquis  CAD status post CABG Dementia  Recommendations/Plan: Continue full code/full scope treatment Patient's  wife requested to reschedule timing of family meeting today, but did not arrive or answer my call PMT will continue to follow and support   Prognosis: Poor long-term prognosis  Discharge Planning: To Be Determined  Care plan was discussed with patient, RN   MDM moderate         Purnell Daigle P Jarom Govan, PA-C  Palliative Medicine Team Team phone # [phone]  Thank you for allowing the Palliative Medicine Team to assist in the care of this patient. Please utilize secure chat with additional questions, if there is no response within 30 minutes please call the above phone number.  Palliative Medicine Team providers are available by phone from 7am to 7pm daily and can be reached through the team cell phone.  Should this patient require assistance outside of these hours, please call the patient's attending physician.

## 2024-08-29 NOTE — Progress Notes (Signed)
 Progress Note  Patient Name: Connor Burns. Date of Encounter: 08/29/2024  Primary Cardiologist: [name], DO  Interval Summary  Reports no shortness of breath at rest.  Net urine output approximately 2600 cc last 24 hours.  Having discussions with palliative care.  Vital Signs  Vitals:   08/28/24 2200 08/29/24 0034 08/29/24 0505 08/29/24 0707  BP:  (!) 124/59 (!) 125/95 118/76  Pulse: 77 73 92 67  Resp:  19 19 16   Temp:  (!) 97.5 F (36.4 C) (!) 97.3 F (36.3 C) 98 F (36.7 C)  TempSrc:  Oral Oral Oral  SpO2: 96% 99% 93% 96%  Weight:        Intake/Output Summary (Last 24 hours) at 08/29/2024 0825 Last data filed at 08/29/2024 0600 Gross per 24 hour  Intake 880 ml  Output 3500 ml  Net -2620 ml   Filed Weights   08/26/24 1800 08/27/24 0442 08/28/24 0441  Weight: (!) 136.7 kg 134.1 kg 133.8 kg    Physical Exam  GEN: No acute distress.   Neck: No JVD. Cardiac: Irregularly irregular without gallop.  Respiratory: Nonlabored. Clear to auscultation bilaterally. GI: Soft, bowel sounds present. MS: Legs wrapped.  ECG/Telemetry  Telemetry reviewed showing atrial fibrillation.  Labs  Chemistry Recent Labs  Lab 08/27/24 0025 08/28/24 0242 08/29/24 0150  NA 137 139 138  K 3.2* 3.2* 3.7  CL 98 98 97*  CO2 27 28 26   GLUCOSE 152* 94 93  BUN 31* 29* 28*  CREATININE 2.47* 2.28* 2.26*  CALCIUM  8.6* 9.0 8.7*  PROT 6.3* 6.7 6.2*  ALBUMIN 2.7* 2.9* 2.6*  AST 30 30 27   ALT 18 17 16   ALKPHOS 178* 173* 170*  BILITOT 7.0* 8.4* 7.7*  GFRNONAA 28* 30* 31*  ANIONGAP 12 13 15     Hematology Recent Labs  Lab 08/26/24 0607 08/26/24 0611  WBC 5.5  --   RBC 4.18*  --   HGB 11.8* 12.9*  HCT 35.8* 38.0*  MCV 85.6  --   MCH 28.2  --   MCHC 33.0  --   RDW 16.9*  --   PLT 158  --    Cardiac Enzymes Recent Labs  Lab 08/26/24 0607 08/26/24 0845  TROPONINIHS 46* 41*   Lipid Panel     Component Value Date/Time   CHOL 137 05/19/2024 1628   TRIG 56  05/19/2024 1628   HDL 56 05/19/2024 1628   CHOLHDL 2.4 05/19/2024 1628   LDLCALC 69 05/19/2024 1628   LABVLDL 12 05/19/2024 1628    Cardiac Studies  Echocardiogram 08/26/2024:  1. LV not seen well (see comment below). There is LV dyssynchrony due to  LBBB. Left ventricular ejection fraction, by estimation, is 30 to 35%. The  left ventricle has moderately decreased function. Left ventricular  endocardial border not optimally  defined to evaluate regional wall motion. There is mild concentric left  ventricular hypertrophy. Left ventricular diastolic parameters are  indeterminate.   2. Right ventricular systolic function is normal. The right ventricular  size is normal.   3. Left atrial size was mildly dilated.   4. The mitral valve is normal in structure. Trivial mitral valve  regurgitation. No evidence of mitral stenosis.   5. The aortic valve is tricuspid. There is moderate calcification of the  aortic valve. Aortic valve regurgitation is not visualized. Aortic valve  sclerosis/calcification is present, without any evidence of aortic  stenosis.   6. The inferior vena cava is dilated in size with <50% respiratory  variability, suggesting right atrial pressure of 15 mmHg.   Assessment & Plan  1.  Acute on chronic HFrEF, LVEF 35 to 40% with normal RV contraction.  GDMT limited by CKD stage IIIb-IV, currently focusing on optimizing fluid status.  He continues to diurese on IV Lasix  80 mg twice daily with potassium supplement.  Additional net output of 2600 cc last 24 hours and stable renal function.  Also on Jardiance  10 mg daily and Coreg  3.125 mg twice daily.  As discussed previously in the chart, not candidate for advanced therapies.  2.  Primary hypertension.  3.  Lower extremity wounds.  Venous stasis ulcers.  Wound care consulted.  4.  CAD status post CABG.  Minor elevation in high-sensitivity troponin I most consistent with demand ischemia rather than ACS.  No active angina.  He  is on Lipitor 20 mg daily, no aspirin  given use of Eliquis .  5.  Persistent atrial fibrillation status post failed ablation with plan for heart rate control.  Continue Eliquis  5 mg twice daily and Coreg  3.125 mg twice daily.  6.  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  7.  CKD stage IIIb-IV, creatinine 2.28 with GFR 31.  Family meeting with palliative care today.  He was switched from IV Lasix  to Demadex  80 mg twice daily by primary team and otherwise continues on Jardiance  10 mg daily, Coreg  3.125 mg twice daily, Lipitor 20 mg daily, and Eliquis  5 mg twice daily.  Still not at optimal dry weight, would follow urine output closely.  For questions or updates, please contact [facility] Please consult [email or web address] for contact info under   Signed, Jayson Sierras, MD  08/29/2024, 8:25 AM

## 2024-08-29 NOTE — Plan of Care (Signed)
  Problem: Clinical Measurements: Goal: Will remain free from infection Outcome: Progressing   Problem: Activity: Goal: Risk for activity intolerance will decrease Outcome: Progressing   Problem: Coping: Goal: Level of anxiety will decrease Outcome: Progressing   Problem: Safety: Goal: Ability to remain free from injury will improve Outcome: Progressing   

## 2024-08-30 ENCOUNTER — Inpatient Hospital Stay (HOSPITAL_COMMUNITY)

## 2024-08-30 ENCOUNTER — Other Ambulatory Visit (HOSPITAL_COMMUNITY): Payer: Self-pay

## 2024-08-30 ENCOUNTER — Encounter (HOSPITAL_BASED_OUTPATIENT_CLINIC_OR_DEPARTMENT_OTHER): Admitting: General Surgery

## 2024-08-30 DIAGNOSIS — N184 Chronic kidney disease, stage 4 (severe): Secondary | ICD-10-CM | POA: Diagnosis not present

## 2024-08-30 DIAGNOSIS — N1832 Chronic kidney disease, stage 3b: Secondary | ICD-10-CM

## 2024-08-30 DIAGNOSIS — I4819 Other persistent atrial fibrillation: Secondary | ICD-10-CM | POA: Diagnosis not present

## 2024-08-30 DIAGNOSIS — R601 Generalized edema: Secondary | ICD-10-CM | POA: Diagnosis not present

## 2024-08-30 DIAGNOSIS — R7989 Other specified abnormal findings of blood chemistry: Secondary | ICD-10-CM | POA: Diagnosis not present

## 2024-08-30 DIAGNOSIS — I5043 Acute on chronic combined systolic (congestive) and diastolic (congestive) heart failure: Secondary | ICD-10-CM | POA: Diagnosis not present

## 2024-08-30 LAB — BLOOD GAS, ARTERIAL
Acid-Base Excess: 11.2 mmol/L — ABNORMAL HIGH (ref 0.0–2.0)
Bicarbonate: 37 mmol/L — ABNORMAL HIGH (ref 20.0–28.0)
Drawn by: 548791
O2 Saturation: 77.5 %
Patient temperature: 36.5
pCO2 arterial: 51 mmHg — ABNORMAL HIGH (ref 32–48)
pH, Arterial: 7.47 — ABNORMAL HIGH (ref 7.35–7.45)
pO2, Arterial: 43 mmHg — ABNORMAL LOW (ref 83–108)

## 2024-08-30 LAB — COMPREHENSIVE METABOLIC PANEL WITH GFR
ALT: 15 U/L (ref 0–44)
AST: 26 U/L (ref 15–41)
Albumin: 2.7 g/dL — ABNORMAL LOW (ref 3.5–5.0)
Alkaline Phosphatase: 176 U/L — ABNORMAL HIGH (ref 38–126)
Anion gap: 14 (ref 5–15)
BUN: 26 mg/dL — ABNORMAL HIGH (ref 8–23)
CO2: 30 mmol/L (ref 22–32)
Calcium: 8.8 mg/dL — ABNORMAL LOW (ref 8.9–10.3)
Chloride: 95 mmol/L — ABNORMAL LOW (ref 98–111)
Creatinine, Ser: 2.17 mg/dL — ABNORMAL HIGH (ref 0.61–1.24)
GFR, Estimated: 32 mL/min — ABNORMAL LOW (ref >60)
Glucose, Bld: 92 mg/dL (ref 70–99)
Potassium: 3.3 mmol/L — ABNORMAL LOW (ref 3.5–5.1)
Sodium: 139 mmol/L (ref 135–145)
Total Bilirubin: 7.8 mg/dL — ABNORMAL HIGH (ref 0.0–1.2)
Total Protein: 6.8 g/dL (ref 6.5–8.1)

## 2024-08-30 LAB — CBC
HCT: 35.1 % — ABNORMAL LOW (ref 39.0–52.0)
Hemoglobin: 11.6 g/dL — ABNORMAL LOW (ref 13.0–17.0)
MCH: 27.9 pg (ref 26.0–34.0)
MCHC: 33 g/dL (ref 30.0–36.0)
MCV: 84.4 fL (ref 80.0–100.0)
Platelets: 158 K/uL (ref 150–400)
RBC: 4.16 MIL/uL — ABNORMAL LOW (ref 4.22–5.81)
RDW: 17 % — ABNORMAL HIGH (ref 11.5–15.5)
WBC: 7.3 K/uL (ref 4.0–10.5)
nRBC: 0 % (ref 0.0–0.2)

## 2024-08-30 LAB — MAGNESIUM: Magnesium: 2.1 mg/dL (ref 1.7–2.4)

## 2024-08-30 MED ORDER — POTASSIUM CHLORIDE CRYS ER 20 MEQ PO TBCR
40.0000 meq | EXTENDED_RELEASE_TABLET | ORAL | Status: AC
  Administered 2024-08-30 (×2): 40 meq via ORAL
  Filled 2024-08-30: qty 2

## 2024-08-30 NOTE — Assessment & Plan Note (Signed)
 K found to be decreased to 3.3 overnight, gave K 40mEq x2. - AM CMP

## 2024-08-30 NOTE — Assessment & Plan Note (Signed)
 LE wounds/venous stasis ulcers: during hospitalization continue silver  sulfadiazine  BID w/ dressing changes; follows with wound care outpt HLD: continue home atorvastatin  20 mg daily Dementia: continue aricept  10 mg daily, melatonin 3 mg and risperdal  0.5 mg at bedtime

## 2024-08-30 NOTE — Progress Notes (Addendum)
 Progress Note  Patient Name: Connor Burns. Date of Encounter: 08/30/2024  Primary Cardiologist: [name], DO  Interval Summary  Pt drowsy but responsive, per RN, not much different from baseline. For ERCP this pm. C/o some SOB but O2 sats are 98% and resp not labored  Vital Signs  Vitals:   08/30/24 0536 08/30/24 0543 08/30/24 0810 08/30/24 0814  BP:  129/81 102/76 102/76  Pulse:  84 85 85  Resp:  20 20   Temp:  98 F (36.7 C) 97.7 F (36.5 C)   TempSrc:  Oral Oral   SpO2:  97% 99%   Weight: 123.4 kg     Height: 6' 2 (1.88 m)       Intake/Output Summary (Last 24 hours) at 08/30/2024 0936 Last data filed at 08/30/2024 0814 Gross per 24 hour  Intake 1437 ml  Output 4550 ml  Net -3113 ml   Filed Weights   08/27/24 0442 08/28/24 0441 08/30/24 0536  Weight: 134.1 kg 133.8 kg 123.4 kg    Physical Exam  GEN: No acute distress.   Neck: JVD elevated Cardiac: Irregularly irregular without gallop.  Respiratory: Nonlabored. Rales bases bilaterally. GI: firm, decreased bowel sounds MS: Legs wrapped but no obvious edema  ECG/Telemetry  Telemetry reviewed showing atrial fibrillation.  Labs  Chemistry Recent Labs  Lab 08/28/24 0242 08/29/24 0150 08/30/24 0258  NA 139 138 139  K 3.2* 3.7 3.3*  CL 98 97* 95*  CO2 28 26 30   GLUCOSE 94 93 92  BUN 29* 28* 26*  CREATININE 2.28* 2.26* 2.17*  CALCIUM  9.0 8.7* 8.8*  PROT 6.7 6.2* 6.8  ALBUMIN 2.9* 2.6* 2.7*  AST 30 27 26   ALT 17 16 15   ALKPHOS 173* 170* 176*  BILITOT 8.4* 7.7* 7.8*  GFRNONAA 30* 31* 32*  ANIONGAP 13 15 14     Hematology Recent Labs  Lab 08/26/24 0607 08/26/24 0611 08/30/24 0258  WBC 5.5  --  7.3  RBC 4.18*  --  4.16*  HGB 11.8* 12.9* 11.6*  HCT 35.8* 38.0* 35.1*  MCV 85.6  --  84.4  MCH 28.2  --  27.9  MCHC 33.0  --  33.0  RDW 16.9*  --  17.0*  PLT 158  --  158   Cardiac Enzymes Recent Labs  Lab 08/26/24 0607 08/26/24 0845  TROPONINIHS 46* 41*   Lipid Panel      Component Value Date/Time   CHOL 137 05/19/2024 1628   TRIG 56 05/19/2024 1628   HDL 56 05/19/2024 1628   CHOLHDL 2.4 05/19/2024 1628   LDLCALC 69 05/19/2024 1628   LABVLDL 12 05/19/2024 1628    Cardiac Studies  Echocardiogram 08/26/2024:  1. LV not seen well (see comment below). There is LV dyssynchrony due to LBBB. Left ventricular ejection fraction, by estimation, is 30 to 35%. The left ventricle has moderately decreased function. Left ventricular endocardial border not optimally  defined to evaluate regional wall motion. There is mild concentric left ventricular hypertrophy. Left ventricular diastolic parameters are  indeterminate.   2. Right ventricular systolic function is normal. The right ventricular  size is normal.   3. Left atrial size was mildly dilated.   4. The mitral valve is normal in structure. Trivial mitral valve  regurgitation. No evidence of mitral stenosis.   5. The aortic valve is tricuspid. There is moderate calcification of the aortic valve. Aortic valve regurgitation is not visualized. Aortic valve sclerosis/calcification is present, without any evidence of aortic stenosis.  6. The inferior vena cava is dilated in size with <50% respiratory  variability, suggesting right atrial pressure of 15 mmHg.   Assessment & Plan  1.  Acute on chronic HFrEF, LVEF 35 to 40% with normal RV contraction.  GDMT limited by CKD stage IIIb-IV, currently focusing on optimizing fluid status. -   IV Lasix  80 mg twice daily changed to Demadex  80 mg bid, with potassium supplement. K+ 3.3 so extra Kdur ordered - net output of 2600 cc last 24 hours and stable renal function. - wt down to 272 lbs -  Also on Jardiance  10 mg daily and Coreg  3.125 mg twice daily.   - As discussed previously in the chart, not candidate for advanced therapies.  2.  Primary hypertension. - BP well-controlled on Demadex  80 mg bid   3.  Lower extremity wounds.  Venous stasis ulcers.  - dressings in place,  f/u with Wound Care as outpt  4.  CAD status post CABG.  Minor elevation in high-sensitivity troponin I most consistent with demand ischemia rather than ACS.  -  No active angina.  He is on Lipitor 20 mg daily, no aspirin  given use of Eliquis .  5.  Persistent atrial fibrillation status post failed ablation with plan for heart rate control.  Continue Eliquis  5 mg twice daily and Coreg  3.125 mg twice daily. - HR is controlled - cont BB w/ PVC's and short runs NSVT  6.  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 - it is MRI compatible.  7.  CKD stage IIIb-IV, creatinine 2.17 today with GFR 32 - this is high-baseline for him - follow  8. Hyperbilirubinemia - T bili 8.7 on admit, down to 7.8 - alk phos 187 >> 176 - MRCP planned for this pm.   9. Decreased mental status - there is concern for worsening dementia according to his nurse - he is on Risperdal  5 mg at bedtime which may contribute - however, Chloride levels are decreasing >> will ck ABG   For questions or updates, please contact [facility] Please consult [email or web address] for contact info under   Signed, Shona Shad, PA-C  08/30/2024, 9:36 AM

## 2024-08-30 NOTE — Assessment & Plan Note (Addendum)
 Total bilirubin stable at 7.8 today. Alk phos remains elevated at 176.  Pt has prior cholecystectomy. RUQ US  neg. Still unclear etiology of his hyperbilirubinemia, which appears to be of a chronic nature.  - MRCP this afternoon; per cardiology ICD is MRI-compatible - AM CMP

## 2024-08-30 NOTE — Progress Notes (Signed)
 Daily Progress Note   Patient Name: Connor Burns.       Date: 08/30/2024 DOB: Feb 13, 1956  Age: 68 y.o. MRN#: [id number] Attending Physician: Donah Laymon PARAS, MD Primary Care Physician: Georgina Speaks, FNP Admit Date: 08/26/2024  Reason for Consultation/Follow-up: Establishing goals of care  Subjective: Medical records reviewed including progress notes, labs, imaging. Patient assessed at the bedside. He was resting and appeared mildly tachypneic. Did not attempt to arouse in order to preserve comfort. No family was present visiting.  I attempted to call patient's wife but was unable to reach.  I left her a voicemail with PMT contact information and a request to return my call to reschedule meeting or discuss goals of care by phone.  I then called patient's daughter who shared she was very interested in a family meeting for GOC discussion. She understands he has several medical conditions and difficulty managing them. We discussed his oxygen  requirements today and upcoming MRCP. Emphasized importance of meeting sooner rather than later given his progressive disease and she agreed that this is going to be crucial. Her mother was supposed to inform her of a date/time for a meeting but she was not made aware of yesterday's plans to meet with PMT for this discussion. She will reach out to patient's wife to coordinate a preferred date/time and inform PMT.   Questions and concerns addressed. PMT will continue to support holistically.   Length of Stay: 4   Physical Exam Vitals and nursing note reviewed.  Constitutional:      General: He is not in acute distress. HENT:     Head: Normocephalic and atraumatic.  Cardiovascular:     Rate and Rhythm: Normal rate.  Pulmonary:     Effort: Pulmonary effort is normal.  Neurological:     Mental Status:  He is alert. Mental status is at baseline.  Psychiatric:        Mood and Affect: Mood normal.        Behavior: Behavior normal.            Vital Signs: BP 102/76   Pulse 85   Temp 97.7 F (36.5 C) (Oral)   Resp 20   Wt 123.4 kg   SpO2 99%   BMI 34.93 kg/m  SpO2: SpO2: 99 % O2 Device: O2 Device: Room Air O2 Flow Rate:        Palliative Care Assessment & Plan   Patient Profile: 68 y.o. male  with past medical history of CAD, MI/CABG in 2005, CHF secondary to ischemic cardiomyopathy, ICD placement, prior DVT, permanent A-fib, stage IV CKD, hyperlipidemia, dementia, prior stroke, DVT with leg wounds, admitted on 08/26/2024 with shortness of breath and edema.    Patient is admitted with acute on chronic heart failure with reduced ejection fraction.  Also has hypokalemia and hyperbilirubinemia.   PMT has been consulted to assist with goals of care conversation.  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 Patient's daughter will attempt to coordinate with her and  inform PMT of a preferred date/time for a family meeting and GOC discussion  PMT will continue to follow and support   Prognosis: Poor long-term prognosis  Discharge Planning: To Be Determined  Care plan was discussed with patient's daughter, primary team          Jaimee Corum SHAUNNA Fell, PA-C  Palliative Medicine Team Team phone # [phone]  Thank you for allowing the Palliative Medicine Team to assist in the care of this patient. Please utilize secure chat with additional questions, if there is no response within 30 minutes please call the above phone number.  Palliative Medicine Team providers are available by phone from 7am to 7pm daily and can be reached through the team cell  phone.  Should this patient require assistance outside of these hours, please call the patient's attending physician.     Time Total: 35  Visit consisted of counseling and education dealing with the complex and emotionally intense issues of symptom management and palliative care in the setting of serious and potentially life-threatening illness. Greater than 50% of this time was spent counseling and coordinating care related to the above assessment and plan.  Personally spent 35 minutes in patient care including extensive chart review (labs, imaging, progress/consult notes, vital signs), medically appropraite exam, discussed with treatment team, education to patient, family, and staff, documenting clinical information, medication review and management, coordination of care, and available advanced directive documents.

## 2024-08-30 NOTE — Progress Notes (Signed)
     Daily Progress Note Intern Pager: [phone]  Patient name: Connor Burns. Medical record number: [id number] Date of birth: 20-Jan-1956 Age: 68 y.o. Gender: male  Primary Care Provider: Georgina Speaks, FNP Consultants: Cardiology Code Status: FULL  Pt Overview and Major Events to Date:  9/4 - admitted to [facility]   Connor Burns is a 68 year old male who presented with shortness of breath and edema, found to have acute on chronic heart failure exacerbation. Medical history includes CAD, permanent A-fib, CHF secondary to ischemic cardiomyopathy, ICD placement, stage IV CKD, dementia, prior stroke.  Assessment & Plan Acute on chronic combined systolic and diastolic CHF (congestive heart failure) (HCC) Stable on room air. Fluid status improving. Transitioned to oral diuretics, tolerating well. - Continue Torsemide  80 mg po BID - Continue Jardiance  10 mg daily, coreg  3.125 mg BID - Cardiology following, appreciate recs - Strict I/Os, daily weights - AM CMP, Mg - PT and OT - Palliative care consulted, appreciate assistance Hypokalemia K found to be decreased to 3.3 overnight, gave K 40mEq x2. - AM CMP Hyperbilirubinemia Total bilirubin stable at 7.8 today. Alk phos remains elevated at 176.  Pt has prior cholecystectomy. RUQ US  neg. Still unclear etiology of his hyperbilirubinemia, which appears to be of a chronic nature.  - MRCP this afternoon; per cardiology ICD is MRI-compatible - AM CMP Chronic health problem LE wounds/venous stasis ulcers: during hospitalization continue silver  sulfadiazine  BID w/ dressing changes; follows with wound care outpt HLD: continue home atorvastatin  20 mg daily Dementia: continue aricept  10 mg daily, melatonin 3 mg and risperdal  0.5 mg at bedtime   FEN/GI: heart healthy diet PPx: home Eliquis  5 mg BID Dispo:Pending PT recommendations . Barriers include ongoing medical management.   Subjective:  Patient denies chest pain, dyspnea at rest. Says he  has not been able to get in contact with his wife.  Objective: Temp:  [97.6 F (36.4 C)-98 F (36.7 C)] 98 F (36.7 C) (09/08 0543) Pulse Rate:  [79-107] 84 (09/08 0543) Resp:  [18-23] 20 (09/08 0543) BP: (122-132)/(79-94) 129/81 (09/08 0543) SpO2:  [88 %-97 %] 97 % (09/08 0543) Weight:  [123.4 kg] 123.4 kg (09/08 0536)  Physical Exam: Connor: well-appearing, NAD Cardiovascular: irregularly irregular rhythm Respiratory: clear to auscultation bilaterally, no crackles, normal breath sounds Abdomen: soft, non-tender Extremities: lower legs wrapped  Laboratory: Most recent CBC Lab Results  Component Value Date   WBC 7.3 08/30/2024   HGB 11.6 (L) 08/30/2024   HCT 35.1 (L) 08/30/2024   MCV 84.4 08/30/2024   PLT 158 08/30/2024   Most recent BMP    Latest Ref Rng & Units 08/30/2024    2:58 AM  BMP  Glucose 70 - 99 mg/dL 92   BUN 8 - 23 mg/dL 26   Creatinine 9.38 - 1.24 mg/dL 7.82   Sodium 864 - 854 mmol/L 139   Potassium 3.5 - 5.1 mmol/L 3.3   Chloride 98 - 111 mmol/L 95   CO2 22 - 32 mmol/L 30   Calcium  8.9 - 10.3 mg/dL 8.8     Other pertinent labs: T bili 7.8, Mg 2.1   Mannie [name] SAILOR, MD 08/30/2024, 7:27 AM  PGY-1, [facility] FPTS Intern pager: [phone], text pages welcome Secure chat group [facility] Teaching Service

## 2024-08-30 NOTE — Progress Notes (Signed)
 ABG sample obtained on room air and sent to lab at this time. Pt placed on 3L nasal cannula at this time without complication.

## 2024-08-30 NOTE — Assessment & Plan Note (Addendum)
 Stable on room air. Fluid status improving. Transitioned to oral diuretics, tolerating well. - Continue Torsemide  80 mg po BID - Continue Jardiance  10 mg daily, coreg  3.125 mg BID - Cardiology following, appreciate recs - Strict I/Os, daily weights - AM CMP, Mg - PT and OT - Palliative care consulted, appreciate assistance

## 2024-08-30 NOTE — Progress Notes (Signed)
 ABG results given to R. Barrett PA. No new orders received at this time. RT will continue to be available as needed.

## 2024-08-31 DIAGNOSIS — N1832 Chronic kidney disease, stage 3b: Secondary | ICD-10-CM | POA: Diagnosis not present

## 2024-08-31 DIAGNOSIS — I5043 Acute on chronic combined systolic (congestive) and diastolic (congestive) heart failure: Secondary | ICD-10-CM | POA: Diagnosis not present

## 2024-08-31 DIAGNOSIS — I4819 Other persistent atrial fibrillation: Secondary | ICD-10-CM | POA: Diagnosis not present

## 2024-08-31 DIAGNOSIS — Z515 Encounter for palliative care: Secondary | ICD-10-CM | POA: Diagnosis not present

## 2024-08-31 DIAGNOSIS — Z7189 Other specified counseling: Secondary | ICD-10-CM | POA: Diagnosis not present

## 2024-08-31 LAB — COMPREHENSIVE METABOLIC PANEL WITH GFR
ALT: 15 U/L (ref 0–44)
AST: 26 U/L (ref 15–41)
Albumin: 2.4 g/dL — ABNORMAL LOW (ref 3.5–5.0)
Alkaline Phosphatase: 167 U/L — ABNORMAL HIGH (ref 38–126)
Anion gap: 14 (ref 5–15)
BUN: 29 mg/dL — ABNORMAL HIGH (ref 8–23)
CO2: 29 mmol/L (ref 22–32)
Calcium: 8.5 mg/dL — ABNORMAL LOW (ref 8.9–10.3)
Chloride: 95 mmol/L — ABNORMAL LOW (ref 98–111)
Creatinine, Ser: 2.21 mg/dL — ABNORMAL HIGH (ref 0.61–1.24)
GFR, Estimated: 32 mL/min — ABNORMAL LOW (ref >60)
Glucose, Bld: 97 mg/dL (ref 70–99)
Potassium: 3.4 mmol/L — ABNORMAL LOW (ref 3.5–5.1)
Sodium: 138 mmol/L (ref 135–145)
Total Bilirubin: 7 mg/dL — ABNORMAL HIGH (ref 0.0–1.2)
Total Protein: 6.3 g/dL — ABNORMAL LOW (ref 6.5–8.1)

## 2024-08-31 MED ORDER — POTASSIUM CHLORIDE CRYS ER 20 MEQ PO TBCR
40.0000 meq | EXTENDED_RELEASE_TABLET | Freq: Once | ORAL | Status: AC
  Administered 2024-08-31: 40 meq via ORAL
  Filled 2024-08-31: qty 2

## 2024-08-31 NOTE — TOC Progression Note (Addendum)
 Transition of Care (TOC) - Progression Note    Patient Details  Name: Connor Burns. MRN: [id number] Date of Birth: 02-Oct-1956  Transition of Care [facility]) CM/SW Contact  Luise JAYSON Pan, [place] Phone Number: 08/31/2024, 11:59 AM  Clinical Narrative:   PT recommendations changed to SNF. Patient oriented x2 at this time. CSW left voicemail for patient spouse.  1:23 PM CSW called patietns spouse, [name], and we discussed PT rec for SNF. [name] declined SNF at this time and stated she would like to look at maximizing [facility] services instead. CSW notified RNCM.   CSW will continue to follow.    Expected Discharge Plan: Home w Home Health Services Barriers to Discharge: Continued Medical Work up               Expected Discharge Plan and Services   Discharge Planning Services: CM Consult Post Acute Care Choice: Durable Medical Equipment, Home Health Living arrangements for the past 2 months: Single Family Home                 DME Arranged: Bedside commode, Walker rolling with seat DME Agency: [employer] Date DME Agency Contacted: 08/27/24 Time DME Agency Contacted: 1141 Representative spoke with at DME Agency: [place] HH Arranged: PT, OT, Nurse's Aide, RN, Social Work [employer] Agency: [facility] Date HH Agency Contacted: 08/27/24 Time HH Agency Contacted: 1142 Representative spoke with at [facility] Agency: [place]   Social Drivers of Health (SDOH) Interventions SDOH Screenings   Food Insecurity: No Food Insecurity (06/11/2024)   Received from [facility]  Housing: Low Risk  (06/11/2024)   Received from [facility]  Transportation Needs: No Transportation Needs (06/11/2024)   Received from [facility]  Utilities: Not At Risk (06/11/2024)   Received from [facility]  Alcohol Screen: Low Risk  (09/15/2023)  Depression (PHQ2-9): Low Risk  (07/02/2024)  Financial Resource Strain: Medium Risk (02/19/2024)  Tobacco Use: Low Risk   (08/26/2024)  Health Literacy: Adequate Health Literacy (02/19/2024)    Readmission Risk Interventions    08/27/2024   11:39 AM  Readmission Risk Prevention Plan  Transportation Screening Complete  HRI or Home Care Consult Complete  Palliative Care Screening Not Applicable  Medication Review (RN Care Manager) Complete

## 2024-08-31 NOTE — Progress Notes (Signed)
 Physical Therapy Treatment Patient Details Name: Connor Burns. MRN: [id number] DOB: Aug 25, 1956 Today's Date: 08/31/2024   History of Present Illness 68 yo male adm 08/26/24 with SOB, LB edema, CHF exacerbation due to medication nonadherence. PMHx: CAD, CHF, ICM, ICD, Afib, CKD, HLD, dementia, CVA    PT Comments  Pt with flat affect, primo fit off on arrival, linens soiled of bowel and bladder. Pt oriented to self but requires increased time and effort to initiate and complete mobility with total assist for pericare. Pt reports SOB with SPO2 93-97% on RA throughout and HR 92-105. Pt continues to demonstrate limited gait and activity tolerance with pt refusing to walk further than to door and back. Per prior conversation with wife pt has to be able to climb stairs to bedroom and do not feel pt would have functional strength to achieve that at this time. Pt requires 24hr supervision which is not currently available at home and would benefit from max [facility] services if wife not open to post acute rehab or ALF. Will continue to follow to maximize strength and mobility as pt able to tolerate based on cognition.    If plan is discharge home, recommend the following: A little help with walking and/or transfers;A little help with bathing/dressing/bathroom;Assistance with cooking/housework;Direct supervision/assist for financial management;Supervision due to cognitive status;Assist for transportation;Help with stairs or ramp for entrance   Can travel by private vehicle        Equipment Recommendations  Rollator (4 wheels);BSC/3in1    Recommendations for Other Services       Precautions / Restrictions Precautions Precautions: Fall Recall of Precautions/Restrictions: Impaired Precaution/Restrictions Comments: incontinent     Mobility  Bed Mobility Overal bed mobility: Needs Assistance Bed Mobility: Supine to Sit     Supine to sit: Min assist, HOB elevated, Used rails     General bed mobility  comments: Significantly increased time, cues to sequence, assist to scoot EOB, HOB 35 degrees. Grossly 10 min to transition from supine to fully sitting EOB    Transfers Overall transfer level: Needs assistance   Transfers: Sit to/from Stand Sit to Stand: Contact guard assist           General transfer comment: Increased time to rise, cues for hand placement. Pt able to hold RW for pericare in standing due to incontinent bowel and bladder in bed. small wound at scrotum noted    Ambulation/Gait Ambulation/Gait assistance: Contact guard assist Gait Distance (Feet): 24 Feet Assistive device: Rolling walker (2 wheels) Gait Pattern/deviations: Step-through pattern, Decreased stride length, Trunk flexed   Gait velocity interpretation: <1.8 ft/sec, indicate of risk for recurrent falls   General Gait Details: cues for proximity to RW, posture, direction and safety. Pt refused walking in hall and does not follow commands for appropriate RW use   Stairs             Wheelchair Mobility     Tilt Bed    Modified Rankin (Stroke Patients Only)       Balance Overall balance assessment: Needs assistance Sitting-balance support: No upper extremity supported, Feet supported Sitting balance-Leahy Scale: Fair     Standing balance support: During functional activity, Reliant on assistive device for balance Standing balance-Leahy Scale: Poor Standing balance comment: Reliant on UB support                            Communication Communication Communication: No apparent difficulties  Cognition Arousal: Alert Behavior During  Therapy: WFL for tasks assessed/performed   PT - Cognitive impairments: Orientation, Awareness, Memory, Safety/Judgement, Problem solving, No family/caregiver present to determine baseline, History of cognitive impairments, Initiation                       PT - Cognition Comments: pt oriented to self, aware he is in the hospital but  states he still works and will walk home. Pt incontinent of B&B on arrival without awareness and no initiation to move to correct Following commands: Impaired Following commands impaired: Follows one step commands with increased time, Follows one step commands inconsistently    Cueing Cueing Techniques: Verbal cues, Gestural cues  Exercises      General Comments        Pertinent Vitals/Pain Pain Assessment Faces Pain Scale: Hurts a little bit Pain Location: Rt knee Pain Descriptors / Indicators: Aching Pain Intervention(s): Limited activity within patient's tolerance, Monitored during session, Repositioned    Home Living                          Prior Function            PT Goals (current goals can now be found in the care plan section) Progress towards PT goals: Progressing toward goals    Frequency    Min 1X/week      PT Plan      Co-evaluation              AM-PAC PT 6 Clicks Mobility   Outcome Measure  Help needed turning from your back to your side while in a flat bed without using bedrails?: A Little Help needed moving from lying on your back to sitting on the side of a flat bed without using bedrails?: A Little Help needed moving to and from a bed to a chair (including a wheelchair)?: A Little Help needed standing up from a chair using your arms (e.g., wheelchair or bedside chair)?: A Little Help needed to walk in hospital room?: A Little Help needed climbing 3-5 steps with a railing? : A Lot 6 Click Score: 17    End of Session Equipment Utilized During Treatment: Gait belt Activity Tolerance: Patient tolerated treatment well Patient left: in chair;with call bell/phone within reach;with chair alarm set;with nursing/sitter in room Nurse Communication: Mobility status PT Visit Diagnosis: Other abnormalities of gait and mobility (R26.89);Difficulty in walking, not elsewhere classified (R26.2);Muscle weakness (generalized) (M62.81)      Time: 9071-8993 PT Time Calculation (min) (ACUTE ONLY): 38 min  Charges:    $Gait Training: 8-22 mins $Therapeutic Activity: 23-37 mins PT General Charges $$ ACUTE PT VISIT: 1 Visit                     Lenoard SQUIBB, PT Acute Rehabilitation Services Office: [phone]    Lenoard NOVAK Joan Avetisyan 08/31/2024, 10:28 AM

## 2024-08-31 NOTE — Progress Notes (Signed)
 Palliative:  HPI: 68 y.o. male  with past medical history of CAD, MI/CABG in 2005, CHF secondary to ischemic cardiomyopathy, ICD placement, prior DVT, permanent A-fib, stage IV CKD, hyperlipidemia, dementia, prior stroke, DVT with leg wounds, admitted on 08/26/2024 with shortness of breath and edema. Patient is admitted with acute on chronic heart failure with reduced ejection fraction.  Also has hypokalemia and hyperbilirubinemia. PMT has been consulted to assist with goals of care conversation.  Received call from wife and coordinated conference call with herself and daughter, [name], to discuss goals of care. I met with Connor Burns who is in overall good spirits. He does have underlying confusion but is interactive and pleasant demeanor. He tells me that he was not on any medication prior to hospitalization when RN shares that he hates taking his medication and how much there is - I explained that he needs these medications for his heart and he agrees to take them. He is asking for his wife so that she can take him home.   I was able to connect with wife and daughter, Connor Burns and [name]. We had a long discussion regarding his heart failure, kidney failure, liver function as well as dementia. We discussed the complexity of these conditions and how they can cause serious complications together. Fortunately [name] is responding well to oral diuresis. He is stable on current medications. We discussed MRCP and that he refused - they report that he is very claustrophobic and would not tolerate well. We discussed work up recently in [place] and how the liver function is being stressed by congestion from the heart. They agree with holding off on MRCP.   Mr. Hufnagle goes to adult day program and rides [employer] to get there. He does leave on his own and comes home on his own while wife is at work. This is not ideal and they recognize that he needs more assistance and not to be alone if possible. I think they could benefit  from a program such as [facility]. Connor Burns attempts to give him medications in the morning but he often refuses to take them. [name] shares that this is his personality and even without dementia he would not have liked taking so many pills. We discussed techniques on how to encourage him to take medications. We also discussed medication titration and consideration of stopping medications such as lipitor and vitamins. We did discuss providing medications such as his diuretics and potassium first so even if he refuses the other he would not be as symptomatic.   We spent much time with education regarding low salt diet, fluid restrictions, and management of fluid status. Discussed elevation of legs when sitting. Family had many good questions regarding management.   We also spent time discussing expectations of his complicated co-morbidities. We discussed that all these issues worsen over time and symptom burden can increase as well. We discussed that there may likely be a time in the future when his kidneys do not respond as well to diuretics and our options may become limited. We discussed the importance of having some difficult discussions and considerations of what to do in a situation of further decline and poor prognosis. We discussed code status and poor outcomes. Both Connor Burns and Connor Burns understand the importance of ongoing discussions. No decisions made today. Hopes for ongoing successful diuresis. Connor Burns would like for him to return home and believes he will do better at home rather than SNF rehab. They both agree with ongoing support from outpatient palliative  care outpatient.   All questions/concerns addressed to the best of my ability. Emotional support provided.   Exam: Alert but confused. No distress. Good spirits. Breathing regular, unlabored. Abd soft. Generalized edema. BLE wrapped.   Plan: - Home with outpatient palliative care and home health - Ongoing goals of care conversations  65 min  Bernarda Kitty, NP Palliative Medicine Team Pager [phone] (Please see [email or web address] for schedule) Team Phone [phone]

## 2024-08-31 NOTE — Progress Notes (Incomplete)
 Advanced Heart Failure Clinic Note   Primary Care: Georgina Speaks, FNP Nephrology: Dr. Tobie [facility]) Primary Cardiologist: Dr. Sheena HF Cardiologist: Dr. Gardenia  HPI: 69 y.o. male with history of CAD s/p MI/CABG in 2004 in [place], HFrEF/ICM s/p ICD, longstanding persistent atrial fibrillation, hx VT, CKD, hx DVT, obesity, dementia, PAD. He previously slaw Dr. Okey in 2018, then followed with Cardiology in [place], [place]. Most recently established with Dr. Sheena in 3/24 after moving back to the area.   Had cath in 2015. Report not available. Available records suggest 2 of his vein grafts were occluded.  Echo 2019: EF 30-35%  Echo at OSH 3/24: EF 30-35%, RV function not assessed  He had a stress test in 3/24 with large area of infarction in inferior wall without reversible ischemia, LVEF 23%. Study was reviewed by Dr. Sheena. Decision made to defer LHC d/t kidney function.   He was admitted 9/24 with acute respiratory failure with hypoxia 2/2 acute on chronic CHF. Had not been compliant with medications. Echo during admit with EF 35-40%, RWMA, RV mildly reduced. Cardiology consulted. He was diuresed. GDMT limited by CKD, blood pressure too soft for BiDil. Weight on admit 294 lb >>> 225 lb at discharge.   He was seen in TOC x 2 post hospital and was instructed to continue ongoing care w/ general cardiology w/ f/u in [facility] as needed.   Follow up 2/25 with Gen Cards and was significantly volume overloaded, wt was up ~50 lb, in the setting of dietary indiscretion and nonadherance to home diuretic regimen. Furoscix  was considered but pt declined citing fear of needles. Torsemide  was increased and he was instructed to follow back up in the [facility].   Admitted 6/25 to OOH in [place] , with AMS. Got in his car and drove from [place] to [place]. Found by police at a gas station, and taken to hospital in [place]. GDMT stopped, given IVF. Found to have elevated LFTs, felt to be hepatic congestion, unable to tolerate  MRCP. Ltd echo showed EF 41-45%. AMS felt to be 2/2 to progressive dementia and he was started on risperidone . Cards consulted due to HF and diuresed with IV lasix , and GDMT restarted.  Today he returns for HF follow up with his wife. Most of history from wife due to patient's dementia. He is not taking medications per wife. He is SOB if he exerts himself. He is swelling. Denies palpitations, abnormal bleeding, CP, dizziness, or PND/Orthopnea. Appetite ok. Weight at home 279 pounds. Uses [facility] and goes to adult center during the day. Wife works during the day.  He returns today for heart failure follow up. Overall feeling ***. NYHA ***. Reports {Symptoms; cardiac:12860::dyspnea,fatigue}. Denies {Symptoms; cardiac:12860::chest pain,dyspnea,fatigue,near-syncope,orthopnea,palpitations,dizziness,abnormal bleeding}. Able to perform ADLs. Appetite okay. Weight at home ***. BP at home***. Compliant with all medications.  Past Medical History:  Diagnosis Date   Acute osteomyelitis of ankle and foot 05/17/2014   Acute systolic heart failure (HCC) 09/12/2023   Arthritis 10/27/2020   Wrist   Atherosclerosis of native arteries of the extremities with ulceration 06/15/2014   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   veral times a day for 10 minutes each time; The patient was also instructed to hold his Xarelto  tonight and tomorrow night.  He is to restart his Xarelto  on Sunday night.   CAD (coronary artery disease)    CHF (congestive heart failure) 01/14/2017   Intermittent CHF with lower extremity edema.  I have given him Lasix  to take on a  p.r.n. basis.Formatting of this note might be different from the original. Last Assessment & Plan: Formatting of this note might be different from the original. Intermittent CHF with lower extremity edema.  I have given him Las   Chronic kidney disease 10/27/2020   Stage II   Congestive cardiomyopathy 01/14/2017    Cardiomyopathy with LV dysfunction.Last ejection fraction 40-45% by echocardiography in 2012.Patient currently not taking beta-blocker Ace inhibitor for some reason.We will repeat echocardiography to evaluate LV function then determine the need for afterload reduction and beta-blocker therapy.[place]   Coronary atherosclerosis 01/14/2017   Status remote CABG in 2004 in [place] .Left internal mammary artery to the obtuse marginal.Free Right internal mammary artery to the LAD.Saphenous vein graft to OM 2.Saphenous vein graft to the RCA.  Asymptomatic without chest pain or shortness of breath.Last nuclear stress test May 2018:  Large   DVT (deep venous thrombosis)    Dyslipidemia 05/07/2018   Hematuria 08/10/2015   Hypertension    ICD (implantable cardioverter-defibrillator) battery depletion 06/16/2018   Status post dual-chamber ICD generator change 06/16/2018.Last Assessment & Plan: Formatting of this note might be different from the original.Left infraclavicular incision intact with Dermabond.  There is no pocket hematoma present.  No drainage noted.Device interrogation this a.m. reveals appropriate device function.   Ischemic cardiomyopathy 06/18/2016   Echo 09/14/2011 showing LA 4.9cm, EF 40-45%.  Mild to moderate decreased LV systolic function.  Mild concentric LVH.  LA is moderately dilated.  Mild aortic cusp sclerosis. Aortic valve appears to be bicuspid.B. Concerning for potential dysrhythmia (ventricular tachycardia) especially given documented nonsustained ventricular tachy   Mild dementia, unclear etiology 12/30/2022   Myocardial infarction    Obesity (BMI 30-39.9) 08/10/2015   Onychomycosis 05/10/2014   Painful legs and moving toes of left foot 05/10/2014   Paroxysmal atrial fibrillation 08/08/2015   A. Paroxysmal.B. On Sotalol therapy.Last Assessment & Plan: History of present atrial fibrillation.  Patient will 80 mg twice daily.Also on rivaroxaban  20 mg a day.Patient again refused an  ECG.I explained to the patient, that the absence of an EKG, I canno   Peripheral vascular disease    Shortness of breath 10/27/2020   Exertional   Stroke 10/27/2020   Right frontal infarct per neuroimaging dating back at least to 2013   Syncope 01/14/2017   In the setting of ischemic cardiomyopathy. B. Concerning for potential dysrhythmia (ventricular tachycardia) especially given documented nonsustained ventricular tachycardia - EPS 9/25/201 showing reproducibly inducible nonsustained monomorphic VT up to 10 beat.  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 In the setting of ischemic cardiomyopathy. B. Concerning for potential dysrhythmia (ventricular tachycardia) especially given documented nonsustained ventricular tachycardia - EPS 9/25/201 showing reproducibly inducible nonsustained monomorphic VT up to 10 beat.  Reproducibly inducible nonsustained polymorphic VT.  Inducible VF wi   No current facility-administered medications for this visit.   Current Outpatient Medications  Medication Sig Dispense Refill   risperiDONE  (RISPERDAL ) 0.5 MG tablet Take 1 tablet by mouth once daily 30 tablet 0   Facility-Administered Medications Ordered in Other Visits  Medication Dose Route Frequency Provider Last Rate Last Admin   acetaminophen  (TYLENOL ) tablet 650 mg  650 mg Oral Q4H PRN Nicholas Bar, MD   650 mg at 08/30/24 2059   apixaban  (ELIQUIS ) tablet 5 mg  5 mg Oral BID Nicholas Bar, MD   5 mg at 08/31/24 [phone]  atorvastatin  (LIPITOR) tablet 20 mg  20 mg Oral Daily Boyina, Akhila, MD   20 mg at 08/31/24 0901   carvedilol  (COREG ) tablet 3.125 mg  3.125 mg Oral BID WC Boyina, Akhila, MD   3.125 mg at 08/31/24 9097   cyanocobalamin  (VITAMIN B12) tablet 1,000 mcg  1,000 mcg Oral Daily Nicholas Bar, MD   1,000 mcg at 08/31/24 0902   donepezil  (ARICEPT )  tablet 10 mg  10 mg Oral Daily Boyina, Akhila, MD   10 mg at 08/31/24 0902   empagliflozin  (JARDIANCE ) tablet 10 mg  10 mg Oral Daily Boyina, Akhila, MD   10 mg at 08/31/24 0902   melatonin tablet 3 mg  3 mg Oral QHS Theophilus Pagan, MD   3 mg at 08/30/24 2100   risperiDONE  (RISPERDAL ) tablet 0.5 mg  0.5 mg Oral QHS Theophilus Pagan, MD   0.5 mg at 08/30/24 2100   silver  sulfADIAZINE  (SILVADENE ) 1 % cream 1 Application  1 Application Topical BID Nicholas Bar, MD   1 Application at 08/31/24 9092   sodium chloride  flush (NS) 0.9 % injection 3 mL  3 mL Intravenous Q12H Nicholas Bar, MD   3 mL at 08/31/24 0905   sodium chloride  flush (NS) 0.9 % injection 3 mL  3 mL Intravenous PRN Nicholas Bar, MD       torsemide  (DEMADEX ) tablet 80 mg  80 mg Oral BID Tharon Lung, MD   80 mg at 08/31/24 0901   Allergies  Allergen Reactions   Clopidogrel Anaphylaxis, Swelling, Other (See Comments) and Cough    Out of body experience    Social History   Socioeconomic History   Marital status: Married    Spouse name: [name]   Number of children: 1   Years of education: 50   Highest education level: Master's degree (e.g., MA, MS, MEng, MEd, MSW, MBA)  Occupational History   Occupation: Chaplain    Comment: [employer]    Comment: newly retired  Tobacco Use   Smoking status: Never   Smokeless tobacco: Never  Vaping Use   Vaping status: Never Used  Substance and Sexual Activity   Alcohol use: No   Drug use: No   Sexual activity: Not on file  Other Topics Concern   Not on file  Social History Narrative   Lives with wife in a 2 story home.  Has 3 children.     Works as a Metallurgist.     Education: Masters in [employer].   Right handed   Caffeine yes   Social Drivers of Health   Financial Resource Strain: Medium Risk (02/19/2024)   Overall Financial Resource Strain (CARDIA)    Difficulty of Paying Living Expenses: Somewhat hard  Food Insecurity: Patient Unable To Answer (08/31/2024)    Hunger Vital Sign    Worried About Running Out of Food in the Last Year: Patient unable to answer    Ran Out of Food in the Last Year: Patient unable to answer  Transportation Needs: Patient Unable To Answer (08/31/2024)   PRAPARE - Transportation    Lack of Transportation (Medical): Patient unable to answer    Lack of Transportation (Non-Medical): Patient unable to answer  Physical Activity: Not on file  Stress: Not on file  Social Connections: Unknown (08/31/2024)   Social Connection and Isolation Panel    Frequency of Communication with Friends and Family: Patient unable to answer    Frequency of Social Gatherings with Friends and Family: Patient unable to answer  Attends Religious Services: Not on file    Active Member of Clubs or Organizations: Not on file    Attends Club or Organization Meetings: Not on file    Marital Status: Not on file  Intimate Partner Violence: Patient Unable To Answer (08/31/2024)   Humiliation, Afraid, Rape, and Kick questionnaire    Fear of Current or Ex-Partner: Patient unable to answer    Emotionally Abused: Patient unable to answer    Physically Abused: Patient unable to answer    Sexually Abused: Patient unable to answer    Family History  Problem Relation Age of Onset   Heart disease Mother        before age 29   Hyperlipidemia Mother    Hypertension Mother    Heart attack Mother    Hyperlipidemia Father    Hypertension Father    Memory loss Brother    Memory loss Brother    Memory loss Half-Sister    Memory loss Maternal Aunt    Wt Readings from Last 3 Encounters:  08/31/24 124.2 kg (273 lb 13 oz)  08/24/24 (!) 137.5 kg (303 lb 3.2 oz)  07/30/24 129.7 kg (286 lb)   There were no vitals taken for this visit.  PHYSICAL EXAM: General: Well appearing. No distress on RA Cardiac: JVP ***. S1 and S2 present. No murmurs or rub. Resp: Lung sounds clear and equal B/L Abdomen: Soft, non-tender, non-distended.  Extremities: Warm and dry.  ***  edema.  Neuro: Alert and oriented x3. Affect pleasant. Moves all extremities without difficulty.  Device interrogation (personally reviewed): OptiVol up, 100% AF, no VT, 0.3 hr/day activity, VP 2.8% ***  ASSESSMENT & PLAN: Acute on Chronic Systolic Heart Failure - iCM - Hx MI s/p CABG in 2004 - s/p ICD - EF has been in the 30-35% x years - Echo 9/24 : EF 35-40%, RV mildly reduced - Echo 6/25: EF 40-45% - NYHA IIIb, volume up on exam and device interrogation, weight up another 5 lbs. - Not taking his medications, ? If mood disturbance present with dementia. - Discussed admission for IV diuresis, patient declines. - Will try Furoscix  + 40 KCL daily x 3 days, hold torsemide  while using Furoscix  - After 3 days, restart torsemide  80 mg bid + 60 KCL bid. - Restart Jardiance  10 mg daily.  - Continue Coreg  3.125 mg bid. - Labs today, repeat BMET & Mag in 1 week.  2. CAD - Hx MI and CABG (LIMA to OM, Free RIMA to LAD, SVG to OM2, SVG to RCA) in 2003. - Records report 2 vein grafts occluded on cath in 2015. - No chest pain. - No ASA with anticoagulation. - Continue statin.  3. Persistent atrial fibrillation - hx Afib ablation - Pursuing rate control - Continue Coreg . - Continue Eliquis  5 mg bid. - Followed by EP.  4. CKD IV - Baseline SCr 2.3-2.5. - Continue Jardiance . - Labs today  5. Dementia - Follows with Neurology.   - Wife is assisting with medications  6. VT - Hx ICD placement - Established with EP  7. PAD - Previously followed by Dr Eliza with [facility], has hx of osteomyelitis - Denies claudication but generally not active - Chronic LE wound, followed by Wound Clinic - ABIs showed moderate PVD in bilateral legs - Continue statin + AC  8. SDOH - uses [facility], at adult center during the day - Wife works & manges his meds, not much home support - Reach out to HFSW, not Paramedicine candidate  due to location. He lives in [place] - [facility] [place]  Follow up in 1  weeks with APP (BMET, Mag and ECG). He is high-risk for re-admission. Assign to Dr. Gardenia.  Follow up in ** with ***  [place] Lucciana Head, NP 08/31/24

## 2024-08-31 NOTE — Assessment & Plan Note (Signed)
 K still low at 3.4, repleted overnight. - AM CMP

## 2024-08-31 NOTE — Assessment & Plan Note (Addendum)
 Stable on room air. Fluid status improving (patient down 10 L of fluid). Tolerating oral diuretics well. Will need to get in contact with wife before discharging to discuss his care. - Continue Torsemide  80 mg po BID - Continue Jardiance  10 mg daily, coreg  3.125 mg BID - Cardiology following, appreciate recs - Strict I/Os, daily weights - AM CMP, Mg - PT and OT - Palliative care consulted, appreciate assistance

## 2024-08-31 NOTE — Assessment & Plan Note (Addendum)
 Total bilirubin decreased to 7.0 today. Alk phos decreased to 167. RUQ US  negative. Patient declined MRCP yesterday. Still unclear etiology of his hyperbilirubinemia, which appears to be of a chronic nature.  - AM CMP; continue to monitor

## 2024-08-31 NOTE — Plan of Care (Signed)
  Problem: Health Behavior/Discharge Planning: Goal: Ability to manage health-related needs will improve Outcome: Not Progressing   Problem: Clinical Measurements: Goal: Ability to maintain clinical measurements within normal limits will improve Outcome: Not Progressing Goal: Will remain free from infection Outcome: Not Progressing Goal: Diagnostic test results will improve Outcome: Not Progressing Goal: Respiratory complications will improve Outcome: Not Progressing Goal: Cardiovascular complication will be avoided Outcome: Not Progressing   Problem: Education: Goal: Knowledge of General Education information will improve Description: Including pain rating scale, medication(s)/side effects and non-pharmacologic comfort measures Outcome: Not Progressing

## 2024-08-31 NOTE — Plan of Care (Signed)
 Attempted to contact patient's wife, Aaban Griep at [phone] to provide updates and discuss goals of care, however was unable to reach her. Left voicemail with call-back number.

## 2024-08-31 NOTE — Assessment & Plan Note (Signed)
 LE wounds/venous stasis ulcers: during hospitalization continue silver  sulfadiazine  BID w/ dressing changes; follows with wound care outpt HLD: continue home atorvastatin  20 mg daily Dementia: continue aricept  10 mg daily, melatonin 3 mg and risperdal  0.5 mg at bedtime

## 2024-08-31 NOTE — Progress Notes (Signed)
     Daily Progress Note Intern Pager: [phone]  Patient name: Connor Burns. Medical record number: [id number] Date of birth: Jan 30, 1956 Age: 68 y.o. Gender: male  Primary Care Provider: Georgina Speaks, FNP Consultants: Cardiology Code Status: FULL  Pt Overview and Major Events to Date:  9/4 - admitted to FMTS   Connor Burns is a 68 year old male who presented with shortness of breath and edema, found to have acute on chronic heart failure exacerbation. Medical history includes CAD, permanent A-fib, CHF secondary to ischemic cardiomyopathy, ICD placement, stage IV CKD, dementia, prior stroke.  Assessment & Plan Acute on chronic combined systolic and diastolic CHF (congestive heart failure) (HCC) Stable on room air. Fluid status improving (patient down 10 L of fluid). Tolerating oral diuretics well. Will need to get in contact with wife before discharging to discuss his care. - Continue Torsemide  80 mg po BID - Continue Jardiance  10 mg daily, coreg  3.125 mg BID - Cardiology following, appreciate recs - Strict I/Os, daily weights - AM CMP, Mg - PT and OT - Palliative care consulted, appreciate assistance Hypokalemia K still low at 3.4, repleted overnight. - AM CMP Hyperbilirubinemia Total bilirubin decreased to 7.0 today. Alk phos decreased to 167. RUQ US  negative. Patient declined MRCP yesterday. Still unclear etiology of his hyperbilirubinemia, which appears to be of a chronic nature.  - AM CMP; continue to monitor Chronic health problem LE wounds/venous stasis ulcers: during hospitalization continue silver  sulfadiazine  BID w/ dressing changes; follows with wound care outpt HLD: continue home atorvastatin  20 mg daily Dementia: continue aricept  10 mg daily, melatonin 3 mg and risperdal  0.5 mg at bedtime   FEN/GI: heart healthy diet PPx: home Eliquis  5 mg BID Dispo:Pending PT recommendations. Barriers include ongoing medical management.   Subjective:  Patient states he's  doing well this morning. Denies chest pain or SOB at rest.  Objective: Temp:  [97.3 F (36.3 C)-98.2 F (36.8 C)] 98.1 F (36.7 C) (09/09 0318) Pulse Rate:  [67-85] 72 (09/08 1555) Resp:  [17-25] 20 (09/09 0400) BP: (102-122)/(56-85) 110/72 (09/09 0318) SpO2:  [96 %-100 %] 97 % (09/09 0318) Weight:  [124.2 kg] 124.2 kg (09/09 0400)  Physical Exam: General: well-appearing, NAD Cardiovascular: irregularly irregular rhythm Respiratory: clear to auscultation bilaterally, no crackles, normal breath sounds Abdomen: soft, non-tender Extremities: lower legs wrapped  Laboratory: Most recent CBC Lab Results  Component Value Date   WBC 7.3 08/30/2024   HGB 11.6 (L) 08/30/2024   HCT 35.1 (L) 08/30/2024   MCV 84.4 08/30/2024   PLT 158 08/30/2024   Most recent BMP    Latest Ref Rng & Units 08/31/2024    3:09 AM  BMP  Glucose 70 - 99 mg/dL 97   BUN 8 - 23 mg/dL 29   Creatinine 9.38 - 1.24 mg/dL 7.78   Sodium 864 - 854 mmol/L 138   Potassium 3.5 - 5.1 mmol/L 3.4   Chloride 98 - 111 mmol/L 95   CO2 22 - 32 mmol/L 29   Calcium  8.9 - 10.3 mg/dL 8.5     Other pertinent labs: T bili 7.0, Alk Phos 167   Mannie [name] SAILOR, MD 08/31/2024, 7:19 AM  PGY-1, [facility] FPTS Intern pager: [phone], text pages welcome Secure chat group [facility] Teaching Service

## 2024-08-31 NOTE — Progress Notes (Signed)
   Rounding Note    Patient Name: Connor Burns. Date of Encounter: 08/31/2024  [facility] Cardiologist: [name], DO   Subjective   No acute concerns overnight. Denies chest pain or shortness of breath. Sitting up in chair, more interactive today.  Inpatient Medications    Scheduled Meds:  apixaban   5 mg Oral BID   atorvastatin   20 mg Oral Daily   carvedilol   3.125 mg Oral BID WC   cyanocobalamin   1,000 mcg Oral Daily   donepezil   10 mg Oral Daily   empagliflozin   10 mg Oral Daily   melatonin  3 mg Oral QHS   risperiDONE   0.5 mg Oral QHS   silver  sulfADIAZINE   1 Application Topical BID   sodium chloride  flush  3 mL Intravenous Q12H   torsemide   80 mg Oral BID   Continuous Infusions:  PRN Meds: acetaminophen , sodium chloride  flush   Vital Signs    Vitals:   08/31/24 0318 08/31/24 0400 08/31/24 0900 08/31/24 1021  BP: 110/72  120/74   Pulse:   92 92  Resp: 20 20 20    Temp: 98.1 F (36.7 C)  97.9 F (36.6 C)   TempSrc: Oral  Oral   SpO2: 97%  96% 97%  Weight:  124.2 kg    Height:        Intake/Output Summary (Last 24 hours) at 08/31/2024 1127 Last data filed at 08/31/2024 0324 Gross per 24 hour  Intake 620 ml  Output 3325 ml  Net -2705 ml      08/31/2024    4:00 AM 08/30/2024    5:36 AM 08/28/2024    4:41 AM  Last 3 Weights  Weight (lbs) 273 lb 13 oz 272 lb 0.8 oz 294 lb 15.6 oz  Weight (kg) 124.2 kg 123.4 kg 133.8 kg      Telemetry    afib, 5 beats NSVT - Personally Reviewed  Physical Exam   GEN: No acute distress.   Neck: No JVD visualized sitting upright Cardiac: irregularly irregular, no murmurs, rubs, or gallops.  Respiratory: Clear to auscultation in upper fields, rales bilaterally GI: firm, +BS MS: firm bilateral LE edema with bilateral Unna boots Neuro:  Nonfocal  Psych: Normal affect   New pertinent results (labs, ECG, imaging, cardiac studies)     Assessment & Plan    Acute on chronic systolic and diastolic heart  failure Chronic kidney disease, stage 3b -continues to diurese well on oral torsemide  -limited GDMT options given his renal function and low blood pressure currently. Currently on carvedilol  low dose and jardiance  -admission weight 136.7 kg, current weight 124.2 kg, net negative ~12L   Chronic venous stasis, LE wounds -wrapped, follows with wound care   Persistent atrial fibrillation -continue apixaban  5 mg BID -rate control with carvedilol  as above   CAD s/p CABG -continue atorvastatin  -no aspirin  as he is on apixaban    Hyperbilirubinemia -patient declined MRCP yesterday -bili downtrending -management per primary team  From a cardiac perspective, he is doing well on oral diuretic. We will follow while admitted, but he can be discharged when appropriate from medical perspective and we can follow up as outpatient.    Signed, Shelda Bruckner, MD  08/31/2024, 11:27 AM

## 2024-09-01 ENCOUNTER — Encounter (HOSPITAL_COMMUNITY)

## 2024-09-01 ENCOUNTER — Other Ambulatory Visit (HOSPITAL_COMMUNITY): Payer: Self-pay

## 2024-09-01 DIAGNOSIS — I5043 Acute on chronic combined systolic (congestive) and diastolic (congestive) heart failure: Secondary | ICD-10-CM | POA: Diagnosis not present

## 2024-09-01 DIAGNOSIS — I482 Chronic atrial fibrillation, unspecified: Secondary | ICD-10-CM | POA: Diagnosis not present

## 2024-09-01 LAB — COMPREHENSIVE METABOLIC PANEL WITH GFR
ALT: 14 U/L (ref 0–44)
AST: 25 U/L (ref 15–41)
Albumin: 2.5 g/dL — ABNORMAL LOW (ref 3.5–5.0)
Alkaline Phosphatase: 165 U/L — ABNORMAL HIGH (ref 38–126)
Anion gap: 12 (ref 5–15)
BUN: 27 mg/dL — ABNORMAL HIGH (ref 8–23)
CO2: 31 mmol/L (ref 22–32)
Calcium: 8.5 mg/dL — ABNORMAL LOW (ref 8.9–10.3)
Chloride: 93 mmol/L — ABNORMAL LOW (ref 98–111)
Creatinine, Ser: 2.13 mg/dL — ABNORMAL HIGH (ref 0.61–1.24)
GFR, Estimated: 33 mL/min — ABNORMAL LOW (ref >60)
Glucose, Bld: 95 mg/dL (ref 70–99)
Potassium: 3.3 mmol/L — ABNORMAL LOW (ref 3.5–5.1)
Sodium: 136 mmol/L (ref 135–145)
Total Bilirubin: 6.5 mg/dL — ABNORMAL HIGH (ref 0.0–1.2)
Total Protein: 6.4 g/dL — ABNORMAL LOW (ref 6.5–8.1)

## 2024-09-01 LAB — CBC
HCT: 33.9 % — ABNORMAL LOW (ref 39.0–52.0)
Hemoglobin: 11.2 g/dL — ABNORMAL LOW (ref 13.0–17.0)
MCH: 28 pg (ref 26.0–34.0)
MCHC: 33 g/dL (ref 30.0–36.0)
MCV: 84.8 fL (ref 80.0–100.0)
Platelets: 162 K/uL (ref 150–400)
RBC: 4 MIL/uL — ABNORMAL LOW (ref 4.22–5.81)
RDW: 16.5 % — ABNORMAL HIGH (ref 11.5–15.5)
WBC: 5.7 K/uL (ref 4.0–10.5)
nRBC: 0 % (ref 0.0–0.2)

## 2024-09-01 MED ORDER — RISPERIDONE 0.5 MG PO TABS
0.5000 mg | ORAL_TABLET | Freq: Every day | ORAL | 0 refills | Status: DC
  Filled 2024-09-01: qty 30, 30d supply, fill #0

## 2024-09-01 MED ORDER — ISOSORBIDE MONONITRATE ER 30 MG PO TB24: 30.0000 mg | ORAL_TABLET | Freq: Every day | ORAL | 0 refills | Status: DC

## 2024-09-01 MED ORDER — POTASSIUM CHLORIDE CRYS ER 20 MEQ PO TBCR
40.0000 meq | EXTENDED_RELEASE_TABLET | Freq: Two times a day (BID) | ORAL | 0 refills | Status: AC
  Filled 2024-09-01: qty 120, 30d supply, fill #0

## 2024-09-01 MED ORDER — POTASSIUM CHLORIDE CRYS ER 20 MEQ PO TBCR
40.0000 meq | EXTENDED_RELEASE_TABLET | ORAL | Status: AC
  Administered 2024-09-01 (×2): 40 meq via ORAL
  Filled 2024-09-01 (×2): qty 2

## 2024-09-01 NOTE — Progress Notes (Signed)
     Daily Progress Note Intern Pager: [phone]  Patient name: Connor Burns. Medical record number: [id number] Date of birth: 11/19/1956 Age: 68 y.o. Gender: male  Primary Care Provider: Georgina Speaks, FNP Consultants: Cardiology Code Status: FULL  Pt Overview and Major Events to Date:  9/4 - admitted to FMTS  Medical Decision Making:  Connor Burns is a 68 year old male who presented with shortness of breath and edema, found to have acute on chronic heart failure exacerbation. Medical history includes CAD, permanent A-fib, CHF secondary to ischemic cardiomyopathy, ICD placement, stage IV CKD, dementia, prior stroke. Assessment & Plan Acute on chronic combined systolic and diastolic CHF (congestive heart failure) (HCC) Stable on room air. Fluid status improving (patient down 12 L of fluid). Tolerating oral diuretics well. Per cardiology, patient okay to discharge from cardiac perspective - Continue Torsemide  80 mg po BID - Continue Jardiance  10 mg daily, coreg  3.125 mg BID - Strict I/Os, daily weights - AM CMP, Mg - PT and OT - Palliative care consulted, appreciate assistance Hypokalemia K still low at 3.3, repleted overnight. - AM CMP Hyperbilirubinemia Total bilirubin decreased to 6.5 today. Alk phos stable at 165.  Patient declined MRCP yesterday. Per wife, patient had prior evaluation for hyperbilirubinemia, and also declined MRCP in the past d/t claustrophobia. Previous provider believed he may have congestive hepatopathy. - AM CMP; continue to monitor Chronic health problem LE wounds/venous stasis ulcers: during hospitalization continue silver  sulfadiazine  BID w/ dressing changes; follows with wound care outpt HLD: continue home atorvastatin  20 mg daily Dementia: continue aricept  10 mg daily, melatonin 3 mg and risperdal  0.5 mg at bedtime   FEN/GI: heart healthy diet PPx: home Eliquis  5 mg BID Dispo:Home with home health  Subjective:  Patient reports doing well.  Denies shortness of breath or chest pain.   Objective: Temp:  [97.4 F (36.3 C)-97.9 F (36.6 C)] 97.6 F (36.4 C) (09/10 0713) Pulse Rate:  [63-92] 63 (09/10 0713) Resp:  [15-21] 17 (09/10 0713) BP: (93-124)/(54-74) 115/69 (09/10 0713) SpO2:  [95 %-100 %] 100 % (09/10 0713) Weight:  [125.6 kg] 125.6 kg (09/10 0300)  Physical Exam: General: obese male, lying in bed, NAD Cardiovascular: irregularly irregular rhythm Respiratory: CTAB; no crackles, normal breath sounds Abdomen: soft, non-tender Extremities: lower legs wrapped  Laboratory: Most recent CBC Lab Results  Component Value Date   WBC 5.7 09/01/2024   HGB 11.2 (L) 09/01/2024   HCT 33.9 (L) 09/01/2024   MCV 84.8 09/01/2024   PLT 162 09/01/2024   Most recent BMP    Latest Ref Rng & Units 09/01/2024    2:16 AM  BMP  Glucose 70 - 99 mg/dL 95   BUN 8 - 23 mg/dL 27   Creatinine 9.38 - 1.24 mg/dL 7.86   Sodium 864 - 854 mmol/L 136   Potassium 3.5 - 5.1 mmol/L 3.3   Chloride 98 - 111 mmol/L 93   CO2 22 - 32 mmol/L 31   Calcium  8.9 - 10.3 mg/dL 8.5     Imaging/Diagnostic Tests: No new imaging  Connor [name] SAILOR, MD 09/01/2024, 8:38 AM  PGY-1, [facility] FPTS Intern pager: [phone], text pages welcome Secure chat group [facility] Teaching Service

## 2024-09-01 NOTE — Care Management Important Message (Signed)
 Important Message  Patient Details  Name: Connor Burns. MRN: [id number] Date of Birth: 1956/06/17   Important Message Given:  Yes - Medicare IM     [name] Sharps 09/01/2024, 11:26 AM

## 2024-09-01 NOTE — Assessment & Plan Note (Addendum)
 Stable on room air. Fluid status improving (patient down 12 L of fluid). Tolerating oral diuretics well. Per cardiology, patient okay to discharge from cardiac perspective - Continue Torsemide  80 mg po BID - Continue Jardiance  10 mg daily, coreg  3.125 mg BID - Strict I/Os, daily weights - AM CMP, Mg - PT and OT - Palliative care consulted, appreciate assistance

## 2024-09-01 NOTE — Progress Notes (Signed)
 Mobility Specialist Progress Note:    09/01/24 1601  Mobility  Activity Ambulated with assistance;Stood at bedside;Dangled on edge of bed  Level of Assistance Contact guard assist, steadying assist  Assistive Device Front wheel walker  Distance Ambulated (ft) 5 ft  Activity Response Tolerated fair  Mobility Referral Yes  Mobility visit 1 Mobility  Mobility Specialist Start Time (ACUTE ONLY) 1601  Mobility Specialist Stop Time (ACUTE ONLY) 1630  Mobility Specialist Time Calculation (min) (ACUTE ONLY) 29 min   Pt needing a lot of motivation to get up for RN. Pt has a lot of self doubt but is able to stand and move w/ CGA. C/o leg and scrotum pain. Left pt on EOB w/ RN.  Venetia Keel Mobility Specialist Please Neurosurgeon or Rehab Office at [phone]

## 2024-09-01 NOTE — Discharge Summary (Addendum)
 Family Medicine Teaching [facility] Discharge Summary  Patient name: Connor Burns. Medical record number: [id number] Date of birth: Apr 22, 1956 Age: 68 y.o. Gender: male Date of Admission: 08/26/2024  Date of Discharge: 09/01/2024 Admitting Physician: Areta Saliva, MD  Primary Care Provider: Georgina Speaks, FNP Consultants: Cardiology  Indication for Hospitalization: acute on chronic heart failure exacerbation  Discharge Diagnoses/Problem List:  Principal Problem for Admission: acute on chronic heart failure exacerbation   Atrial fibrillation, permanent (HCC)   Hypokalemia   Hyperbilirubinemia    Brief Hospital Course:  Connor Burns. is a 68 y.o.male with a history of CAD, CHF secondary to ischemic cardiomyopathy, ICD placement, prior DVT, permanent A-fib, stage IV CKD, hyperlipidemia, dementia, prior stroke who was admitted to the [facility] Medicine Teaching Service at [facility] for acute on chronic heart failure exacerbation in the setting of medication non-adherence. His hospital course is detailed below:  Acute on chronic combined systolic and diastolic CHF Patient presented with acute dyspnea, anasarca, weight gain, and increased fatigue for 3 weeks. Echocardiogram showed LVEF of 30-35%. Cardiology consulted, and they recommended  conservative treatment and diuresis. GDMT was limited due to CKD. He received IV diuretics and was transitioned to oral diuretics with improvement in symptoms and volume status. Palliative medical team was consulted to discuss goals of care with patient and family. Family declined SNF, but was agreeable to home health services.  Hypokalemia Patient found to have a low K of 3.2 due to chronic diuresis. K was repleted as indicated, however patient continued to have hypokalemia throughout admission. At time of discharge, K was 3.3. Prescribed oral potassium supplementation upon discharge.  Hyperbilirubinemia Patient presented with chronically elevated  bilirubin. Right upper quadrant ultrasound was unremarkable. Considered MRCP, however patient declined. On day of discharge, bilirubin had down trended to 6.5.  Multiple open wounds of lower extremity Patient found to have multiple venous stasis ulcers on admission. Wound/ostomy care consulted. Silver  sulfadiazine  applied twice daily to affected areas.  Other chronic conditions were medically managed with home medications and formulary alternatives as necessary (dementia, Afib, CAD, HLD, CKD stage IV)  PCP Follow-up Recommendations: Consider further workup of hyperbilirubinemia with CT abdomen with contrast (caution with CKD) given unable to complete MRCP Ensure volume status stable with diuretic regimen Consider repeat CMP to assess K and bilirubin  Disposition: Home with Home Health PT  Discharge Condition: Stable  Discharge Exam:  Vitals:   09/01/24 0713 09/01/24 1126  BP: 115/69 128/78  Pulse: 63 80  Resp: 17 19  Temp: 97.6 F (36.4 C) 98 F (36.7 C)  SpO2: 100% 100%   Physical Exam: General: obese male, lying in bed, NAD Cardiovascular: irregularly irregular rhythm Respiratory: CTAB; no crackles, normal breath sounds Abdomen: soft, non-tender Extremities: lower legs wrapped  Significant Procedures: RUQ US   Significant Labs and Imaging:  Recent Labs  Lab 09/01/24 0216  WBC 5.7  HGB 11.2*  HCT 33.9*  PLT 162   Recent Labs  Lab 08/31/24 0309 09/01/24 0216  NA 138 136  K 3.4* 3.3*  CL 95* 93*  CO2 29 31  GLUCOSE 97 95  BUN 29* 27*  CREATININE 2.21* 2.13*  CALCIUM  8.5* 8.5*  ALKPHOS 167* 165*  AST 26 25  ALT 15 14  ALBUMIN 2.4* 2.5*   CXR Impression from Radiologist:  1. Continued small-to-moderate layering pleural effusions, slightly more so on the right. 2. Overlying atelectasis or airspace disease in the lower lung fields, more noticeable on the right due to the elevated  hemidiaphragm. Overall aeration seems unchanged from 1 year ago. 3. Stable  cardiomegaly. No vascular congestion.   US  Abdomen Limited RUQ Impression from Radiologist: 1. No acute sonographic abnormality of the right upper quadrant. 2. Partially imaged right pleural effusion.  Discharge Medications:  Allergies as of 09/01/2024       Reactions   Clopidogrel Anaphylaxis, Swelling, Other (See Comments), Cough   Out of body experience         Medication List     PAUSE taking these medications    Furoscix  80 MG/10ML Ctkt Wait to take this until your doctor or other care provider tells you to start again. Generic drug: Furosemide  Inject 80 mg into the skin daily as needed.       TAKE these medications    apixaban  5 MG Tabs tablet Commonly known as: Eliquis  Take 1 tablet (5 mg total) by mouth 2 (two) times daily.   atorvastatin  20 MG tablet Commonly known as: LIPITOR Take 1 tablet (20 mg total) by mouth daily.   carvedilol  3.125 MG tablet Commonly known as: COREG  Take 1 tablet (3.125 mg total) by mouth 2 (two) times daily.   ciclopirox  8 % solution Commonly known as: PENLAC  Apply topically at bedtime. Apply over nail and surrounding skin. Apply daily over previous coat. After seven (7) days, may remove with alcohol and continue cycle.   cyanocobalamin  1000 MCG tablet Commonly known as: VITAMIN B12 Take 1 tablet (1,000 mcg total) by mouth daily.   donepezil  10 MG tablet Commonly known as: ARICEPT  Take 1 tablet (10 mg total) by mouth daily.   empagliflozin  10 MG Tabs tablet Commonly known as: Jardiance  Take 1 tablet (10 mg total) by mouth daily.   isosorbide  mononitrate 30 MG 24 hr tablet Commonly known as: IMDUR  Take 30 mg by mouth daily. Patient is taking 1/2 tablet daily per instructions on his prescription bottle   potassium chloride  SA 20 MEQ tablet Commonly known as: KLOR-CON  M Take 2 tablets (40 mEq total) by mouth 2 (two) times daily. What changed: how much to take   risperiDONE  0.5 MG tablet Commonly known as:  RISPERDAL  Take 1 tablet (0.5 mg total) by mouth at bedtime.   silver  sulfADIAZINE  1 % cream Commonly known as: SILVADENE  Apply 1 Application topically 2 (two) times daily. Clean site of wound and apply cream.   SV Iron 325 (65 Fe) MG tablet Generic drug: ferrous sulfate Take 325 mg by mouth 3 (three) times a week.   torsemide  20 MG tablet Commonly known as: DEMADEX  Take 4 tablets (80 mg total) by mouth 2 (two) times daily.               Durable Medical Equipment  (From admission, onward)           Start     Ordered   08/27/24 1135  For home use only DME Bedside commode  Once       Question:  Patient needs a bedside commode to treat with the following condition  Answer:  Weakness   08/27/24 1134   08/27/24 1134  For home use only DME 4 wheeled rolling walker with seat  Once       Question:  Patient needs a walker to treat with the following condition  Answer:  Weakness   08/27/24 1134            Discharge Instructions: Please refer to patient Instructions section of EMR for full details.  Patient was counseled important signs and  symptoms that should prompt return to medical care, changes in medications, dietary instructions, activity restrictions, and follow up appointments.   Follow-Up Appointments:  Follow-up Information     [facility] Follow up.   Specialty: Home Health Services Why: Agency will call you to set up apt times Contact information: [address] [place] [address] [phone]         [facility] Follow up.   Why: rollator, bedside commode Contact information: [address] [place], [place] [address] [phone]        Georgina Speaks, FNP. Schedule an appointment as soon as possible for a visit.   Specialty: General Practice Why: ASAP for hospital follow-up. Contact information: [address] [place] [address] [phone]                 Mannie [name] SAILOR, MD 09/01/2024,  11:35 AM PGY-1, [facility] Family Medicine   I agree with the assessment and plan as documented above.  Stuart Redo, MD PGY-3, [facility] Family Medicine

## 2024-09-01 NOTE — Assessment & Plan Note (Signed)
 LE wounds/venous stasis ulcers: during hospitalization continue silver  sulfadiazine  BID w/ dressing changes; follows with wound care outpt HLD: continue home atorvastatin  20 mg daily Dementia: continue aricept  10 mg daily, melatonin 3 mg and risperdal  0.5 mg at bedtime

## 2024-09-01 NOTE — Assessment & Plan Note (Signed)
 K still low at 3.3, repleted overnight. - AM CMP

## 2024-09-01 NOTE — TOC Transition Note (Addendum)
 Transition of Care [facility]) - Discharge Note   Patient Details  Name: Connor Burns. MRN: [id number] Date of Birth: 01-20-1956  Transition of Care [facility]) CM/SW Contact:  Waddell Barnie Rama, RN Phone Number: 09/01/2024, 1:44 PM   Clinical Narrative:    For dc today, NCM tried to contact the wife, she is at work and will be off at 3:30 pm and will be here to transport patient home at 4pm today.  NCM spoke with daughter and she confirmed this information.  He has a referral made to [facility]  with [facility] for outpatient palliative services.  Also NCM made referral to Leita with [facility] in [place] [phone]  on vm to call wife , since they are interested in their services per daughter they would like to be referred to [facility]. NCM left [facility] brochure in room for wife also.  Patient already goes to a Day Center in [place] - Thursday up til 12 noon.     Final next level of care: Home w Home Health Services Barriers to Discharge: Continued Medical Work up   Patient Goals and CMS Choice Patient states their goals for this hospitalization and ongoing recovery are:: return home with [facility] CMS Medicare.gov Compare Post Acute Care list provided to:: Patient Choice offered to / list presented to : Patient      Discharge Placement                       Discharge Plan and Services Additional resources added to the After Visit Summary for     Discharge Planning Services: CM Consult Post Acute Care Choice: Durable Medical Equipment, Home Health          DME Arranged: Bedside commode, Walker rolling with seat DME Agency: [employer] Date DME Agency Contacted: 08/27/24 Time DME Agency Contacted: 1141 Representative spoke with at DME Agency: [place] HH Arranged: PT, OT, Nurse's Aide, RN, Social Work [employer] Agency: [facility] Date HH Agency Contacted: 08/27/24 Time HH Agency Contacted: 1142 Representative spoke with at [facility] Agency: [place]  Social  Drivers of Health (SDOH) Interventions SDOH Screenings   Food Insecurity: Patient Unable To Answer (08/31/2024)  Housing: Unknown (08/31/2024)  Transportation Needs: Patient Unable To Answer (08/31/2024)  Utilities: Patient Unable To Answer (08/31/2024)  Alcohol Screen: Low Risk  (09/15/2023)  Depression (PHQ2-9): Low Risk  (07/02/2024)  Financial Resource Strain: Medium Risk (02/19/2024)  Social Connections: Unknown (08/31/2024)  Tobacco Use: Low Risk  (08/26/2024)  Health Literacy: Adequate Health Literacy (02/19/2024)     Readmission Risk Interventions    08/27/2024   11:39 AM  Readmission Risk Prevention Plan  Transportation Screening Complete  HRI or Home Care Consult Complete  Palliative Care Screening Not Applicable  Medication Review (RN Care Manager) Complete

## 2024-09-01 NOTE — Discharge Instructions (Addendum)
 Dear Keyshun Usery Jr.,  Thank you for letting us  participate in your care. You were hospitalized for shortness of breath and diagnosed with Acute on chronic combined systolic and diastolic CHF (congestive heart failure) (HCC). You were treated with diuretics.  You are also found to have elevated bilirubin levels but unfortunately were unable to complete the MRI scan to further look at a potential cause.  POST-HOSPITAL & CARE INSTRUCTIONS Take all of your medications as listed in this packet. Go to your follow up appointments (listed below)  DOCTOR'S APPOINTMENT   Future Appointments  Date Time Provider Department Center  09/06/2024  3:20 PM Serene Gaile ORN, MD [facility] H&V  09/14/2024  4:00 PM Hope Almarie ORN, NP [facility] [address]  10/05/2024  3:40 PM Georgina Speaks, FNP [facility] [address]  10/13/2024  2:00 PM TIMA-ANNUAL WELLNESS VISIT [facility] [address]  10/27/2024  7:05 AM CVD HVT DEVICE REMOTES [facility] H&V  11/02/2024 10:15 AM Lamount Ethan CROME, DPM [facility]  01/07/2025  9:00 AM Dina Camie BRAVO, PA-C LBN-[facility]NG None  01/26/2025  7:05 AM CVD HVT DEVICE REMOTES [facility] H&V  04/27/2025  7:05 AM CVD HVT DEVICE REMOTES [facility] H&V  07/27/2025  7:05 AM CVD HVT DEVICE REMOTES [facility] H&V  10/26/2025  7:05 AM CVD HVT DEVICE REMOTES CVD-MAGST H&V  01/25/2026  7:05 AM CVD HVT DEVICE REMOTES [facility] H&V    Follow-up Information     [facility] Follow up.   Specialty: Home Health Services Why: Agency will call you to set up apt times Contact information: [address] [place] [address] [phone]         [facility] Follow up.   Why: rollator, bedside commode Contact information: [address] [place], [place] [address] [phone]        Georgina Speaks, FNP. Schedule an appointment as soon as possible for a visit.   Specialty: General Practice Why: ASAP for hospital follow-up. Contact information: [address] [place] [address] [phone]                 Take care and be well!  Family Medicine Teaching Service Inpatient Team [place]  [facility]  [address] [place], [place] [address] [phone]

## 2024-09-01 NOTE — Assessment & Plan Note (Signed)
 Total bilirubin decreased to 6.5 today. Alk phos stable at 165.  Patient declined MRCP yesterday. Per wife, patient had prior evaluation for hyperbilirubinemia, and also declined MRCP in the past d/t claustrophobia. Previous provider believed he may have congestive hepatopathy. - AM CMP; continue to monitor

## 2024-09-01 NOTE — Progress Notes (Addendum)
  Progress Note  Patient Name: Connor Burns. Date of Encounter: 09/01/2024 [facility] Cardiologist: [name], DO   Interval Summary   Denies any chest pain, shortness of breath, orthopnea, abdominal pain.  Vital Signs Vitals:   08/31/24 2310 08/31/24 2315 09/01/24 0300 09/01/24 0713  BP:  (!) 93/54 111/61 115/69  Pulse:  72 86 63  Resp: 16 15 20 17   Temp:  (!) 97.5 F (36.4 C) (!) 97.5 F (36.4 C) 97.6 F (36.4 C)  TempSrc:  Oral Oral Oral  SpO2:  97% 99% 100%  Weight:   125.6 kg   Height:        Intake/Output Summary (Last 24 hours) at 09/01/2024 0910 Last data filed at 09/01/2024 0800 Gross per 24 hour  Intake 720 ml  Output 3850 ml  Net -3130 ml      09/01/2024    3:00 AM 08/31/2024    4:00 AM 08/30/2024    5:36 AM  Last 3 Weights  Weight (lbs) 276 lb 14.4 oz 273 lb 13 oz 272 lb 0.8 oz  Weight (kg) 125.6 kg 124.2 kg 123.4 kg      Telemetry/ECG  Atrial fibrillation with heart rates in the 70s to 90s.  Has multifocal PVCs.  Has had several runs of 3 PVCs (NSVT)- Personally Reviewed  Physical Exam  GEN: No acute distress.  Comfortable in bed, able to lay flat, on room air. Neck: No JVD Cardiac: Irregularly irregular rhythm, rate well-controlled, no murmurs, rubs, or gallops.  Respiratory: Clear to auscultation bilaterally. GI: Soft, nontender, non-distended  MS: No edema.  Has bandages present on bilateral lower extremities.  Assessment & Plan   Acute on chronic HFrEF s/p ICD NSVT CKD stage IIIb Hypokalemia Prior to admission patient was followed by the heart failure clinic and was on potassium 60 mg twice daily, torsemide  80 mg twice daily or PRN Furoscix .  Patient was reportedly declining to take medications at home. Echo on 08/27/2024 showed an LVEF of 30 to 35%. I's and O's are net down 15.7 L since admission and 3 L in the last day.  Weight is down at least 11 kg's since admission but up about 1 KG from the last weight. Potassium was 3.3 this  a.m.  Has potassium replacement ordered.  Has had hypokalemia for multiple days.  May need additional potassium. Continue torsemide  80 mg twice daily.  GDMT Continue Coreg  3.125 mg twice daily Continue Jardiance  10 mg daily GDMT otherwise limited by stage IIIa CKD.   CAD status post CABG Hyperlipidemia Continue atorvastatin  20 mg daily No need for aspirin  given patient is currently on Eliquis  for atrial fibrillation.   Persistent atrial fibrillation CHA2DS2-VASc Score = 4 [CHF History: 1, HTN History: 1, Diabetes History: 0, Stroke History: 0, Vascular Disease History: 1, Age Score: 1, Gender Score: 0].  Therefore, the patient's annual risk of stroke is 4.8 %.    Continue Eliquis  5 mg twice daily Continue Coreg  as above   Otherwise managed per primary   [facility] will sign off.   The patient is ready for discharge today from a cardiac standpoint. Medication Recommendations: As above Other recommendations (labs, testing, etc): Needs outpatient bmet in about 1 week Follow up as an outpatient:  Follow-up with heart failure clinic. For questions or updates, please contact [facility] Please consult [email or web address] for contact info under       Signed, Kashawn Manzano, PA-C

## 2024-09-02 ENCOUNTER — Telehealth: Payer: Self-pay | Admitting: *Deleted

## 2024-09-02 DIAGNOSIS — I130 Hypertensive heart and chronic kidney disease with heart failure and stage 1 through stage 4 chronic kidney disease, or unspecified chronic kidney disease: Secondary | ICD-10-CM | POA: Diagnosis not present

## 2024-09-02 DIAGNOSIS — I255 Ischemic cardiomyopathy: Secondary | ICD-10-CM | POA: Diagnosis not present

## 2024-09-02 DIAGNOSIS — N184 Chronic kidney disease, stage 4 (severe): Secondary | ICD-10-CM | POA: Diagnosis not present

## 2024-09-02 DIAGNOSIS — I5043 Acute on chronic combined systolic (congestive) and diastolic (congestive) heart failure: Secondary | ICD-10-CM | POA: Diagnosis not present

## 2024-09-02 DIAGNOSIS — I2510 Atherosclerotic heart disease of native coronary artery without angina pectoris: Secondary | ICD-10-CM | POA: Diagnosis not present

## 2024-09-02 DIAGNOSIS — E785 Hyperlipidemia, unspecified: Secondary | ICD-10-CM | POA: Diagnosis not present

## 2024-09-02 DIAGNOSIS — I739 Peripheral vascular disease, unspecified: Secondary | ICD-10-CM | POA: Diagnosis not present

## 2024-09-02 DIAGNOSIS — E876 Hypokalemia: Secondary | ICD-10-CM | POA: Diagnosis not present

## 2024-09-02 DIAGNOSIS — M1990 Unspecified osteoarthritis, unspecified site: Secondary | ICD-10-CM | POA: Diagnosis not present

## 2024-09-02 DIAGNOSIS — F0390 Unspecified dementia without behavioral disturbance: Secondary | ICD-10-CM | POA: Diagnosis not present

## 2024-09-02 DIAGNOSIS — I4821 Permanent atrial fibrillation: Secondary | ICD-10-CM | POA: Diagnosis not present

## 2024-09-02 DIAGNOSIS — I252 Old myocardial infarction: Secondary | ICD-10-CM | POA: Diagnosis not present

## 2024-09-02 NOTE — Transitions of Care (Post Inpatient/ED Visit) (Signed)
   09/02/2024  Name: Connor Burns. MRN: [id number] DOB: 1956/02/18  Today's TOC FU Call Status: Today's TOC FU Call Status:: Unsuccessful Call (1st Attempt) Unsuccessful Call (1st Attempt) Date: 09/02/24  Attempted to reach the patient regarding the most recent Inpatient/ED visit.  A detailed HIPAA compliant message was left, including RNCM contact information, requesting a return call.   Follow Up Plan: Additional outreach attempts will be made to reach the patient to complete the Transitions of Care (Post Inpatient/ED visit) call.   Andrea Dimes RN, BSN [place]  [facility] RN Care Manager [phone]

## 2024-09-03 ENCOUNTER — Telehealth: Payer: Self-pay | Admitting: *Deleted

## 2024-09-03 NOTE — Transitions of Care (Post Inpatient/ED Visit) (Addendum)
   09/03/2024  Name: Connor Burns. MRN: [id number] DOB: February 12, 1956  Today's TOC FU Call Status: Today's TOC FU Call Status:: Unsuccessful Call (2nd Attempt) Unsuccessful Call (2nd Attempt) Date: 09/03/24  Attempted to reach the patient regarding the most recent Inpatient/ED visit. Patient answered, states his spouse is not home, pt is not able to appropriately answer questions, diagnosis of dementia, per EMR, spouse provides all oversight for patient's care.  Follow Up Plan: Additional outreach attempts will be made to reach the patient to complete the Transitions of Care (Post Inpatient/ED visit) call.   Mliss Creed [facility], BSN RN Care Manager/ Transition of Care [place] [phone]

## 2024-09-06 ENCOUNTER — Telehealth: Payer: Self-pay

## 2024-09-06 ENCOUNTER — Ambulatory Visit: Admitting: Surgery

## 2024-09-06 ENCOUNTER — Telehealth: Payer: Self-pay | Admitting: *Deleted

## 2024-09-06 NOTE — Transitions of Care (Post Inpatient/ED Visit) (Signed)
   09/06/2024  Name: Connor Burns. MRN: [id number] DOB: 1956-03-29  Today's TOC FU Call Status: Unsuccessful Call (3rd Attempt) Date: 09/06/24  Attempted to reach the patient regarding the most recent Inpatient/ED visit.  Follow Up Plan: No further outreach attempts will be made at this time. We have been unable to contact the patient.  Andrea Dimes RN, BSN [place]  [facility] RN Care Manager [phone]

## 2024-09-06 NOTE — Telephone Encounter (Signed)
 Copied from CRM #[id number]. Topic: Clinical - Home Health Verbal Orders >> Sep 03, 2024  3:32 PM Travis FALCON wrote: Caller/Agency: [facility]  Callback Number: [phone] Service Requested: Skilled Nursing Frequency: 1 week 9  Any new concerns about the patient? No  LVM for stephanie with the okay to proceed.

## 2024-09-06 NOTE — Telephone Encounter (Signed)
 Copied from CRM #[id number]. Topic: Clinical - Home Health Verbal Orders >> Sep 03, 2024 11:12 AM Charlet HERO wrote: Caller/Agency: [facility]/ [facility] Number: [phone] Service Requested: Pallative Care Frequency: N/A Any new concerns about the patient? No  LVM FOR TRACY.

## 2024-09-06 NOTE — Transitions of Care (Post Inpatient/ED Visit) (Signed)
   09/06/2024  Name: Connor Burns. MRN: [id number] DOB: 01/29/1956  Today's TOC FU Call Status: Today's TOC FU Call Status:: Unsuccessful Call (3rd Attempt) Unsuccessful Call (3rd Attempt) Date: 09/06/24  Attempted to reach the patient regarding the most recent Inpatient/ED visit.  Follow Up Plan: No further outreach attempts will be made at this time. We have been unable to contact the patient.  Signature Kristeen Lunger, CMA

## 2024-09-07 ENCOUNTER — Encounter

## 2024-09-07 ENCOUNTER — Other Ambulatory Visit: Payer: Self-pay

## 2024-09-07 ENCOUNTER — Encounter: Payer: Self-pay | Admitting: Primary Care

## 2024-09-07 MED ORDER — ISOSORBIDE MONONITRATE ER 30 MG PO TB24: 30.0000 mg | ORAL_TABLET | Freq: Every day | ORAL | 0 refills | Status: DC

## 2024-09-10 DIAGNOSIS — R0602 Shortness of breath: Secondary | ICD-10-CM | POA: Diagnosis not present

## 2024-09-13 ENCOUNTER — Encounter: Payer: Self-pay | Admitting: Family Medicine

## 2024-09-14 ENCOUNTER — Ambulatory Visit: Admitting: Primary Care

## 2024-09-14 DIAGNOSIS — I252 Old myocardial infarction: Secondary | ICD-10-CM | POA: Diagnosis not present

## 2024-09-14 DIAGNOSIS — I5043 Acute on chronic combined systolic (congestive) and diastolic (congestive) heart failure: Secondary | ICD-10-CM | POA: Diagnosis not present

## 2024-09-14 DIAGNOSIS — E785 Hyperlipidemia, unspecified: Secondary | ICD-10-CM | POA: Diagnosis not present

## 2024-09-14 DIAGNOSIS — I2510 Atherosclerotic heart disease of native coronary artery without angina pectoris: Secondary | ICD-10-CM | POA: Diagnosis not present

## 2024-09-14 DIAGNOSIS — F0390 Unspecified dementia without behavioral disturbance: Secondary | ICD-10-CM | POA: Diagnosis not present

## 2024-09-14 DIAGNOSIS — I4821 Permanent atrial fibrillation: Secondary | ICD-10-CM | POA: Diagnosis not present

## 2024-09-14 DIAGNOSIS — I739 Peripheral vascular disease, unspecified: Secondary | ICD-10-CM | POA: Diagnosis not present

## 2024-09-14 DIAGNOSIS — N184 Chronic kidney disease, stage 4 (severe): Secondary | ICD-10-CM | POA: Diagnosis not present

## 2024-09-14 DIAGNOSIS — E876 Hypokalemia: Secondary | ICD-10-CM | POA: Diagnosis not present

## 2024-09-14 DIAGNOSIS — I255 Ischemic cardiomyopathy: Secondary | ICD-10-CM | POA: Diagnosis not present

## 2024-09-14 DIAGNOSIS — I130 Hypertensive heart and chronic kidney disease with heart failure and stage 1 through stage 4 chronic kidney disease, or unspecified chronic kidney disease: Secondary | ICD-10-CM | POA: Diagnosis not present

## 2024-09-14 DIAGNOSIS — M1990 Unspecified osteoarthritis, unspecified site: Secondary | ICD-10-CM | POA: Diagnosis not present

## 2024-09-16 ENCOUNTER — Telehealth: Payer: Self-pay

## 2024-09-16 DIAGNOSIS — I5043 Acute on chronic combined systolic (congestive) and diastolic (congestive) heart failure: Secondary | ICD-10-CM | POA: Diagnosis not present

## 2024-09-16 DIAGNOSIS — N184 Chronic kidney disease, stage 4 (severe): Secondary | ICD-10-CM | POA: Diagnosis not present

## 2024-09-16 DIAGNOSIS — I130 Hypertensive heart and chronic kidney disease with heart failure and stage 1 through stage 4 chronic kidney disease, or unspecified chronic kidney disease: Secondary | ICD-10-CM | POA: Diagnosis not present

## 2024-09-16 DIAGNOSIS — I255 Ischemic cardiomyopathy: Secondary | ICD-10-CM | POA: Diagnosis not present

## 2024-09-16 DIAGNOSIS — I739 Peripheral vascular disease, unspecified: Secondary | ICD-10-CM | POA: Diagnosis not present

## 2024-09-16 DIAGNOSIS — M1990 Unspecified osteoarthritis, unspecified site: Secondary | ICD-10-CM | POA: Diagnosis not present

## 2024-09-16 DIAGNOSIS — I252 Old myocardial infarction: Secondary | ICD-10-CM | POA: Diagnosis not present

## 2024-09-16 DIAGNOSIS — I2510 Atherosclerotic heart disease of native coronary artery without angina pectoris: Secondary | ICD-10-CM | POA: Diagnosis not present

## 2024-09-16 DIAGNOSIS — F0390 Unspecified dementia without behavioral disturbance: Secondary | ICD-10-CM | POA: Diagnosis not present

## 2024-09-16 DIAGNOSIS — E785 Hyperlipidemia, unspecified: Secondary | ICD-10-CM | POA: Diagnosis not present

## 2024-09-16 DIAGNOSIS — E876 Hypokalemia: Secondary | ICD-10-CM | POA: Diagnosis not present

## 2024-09-16 DIAGNOSIS — I4821 Permanent atrial fibrillation: Secondary | ICD-10-CM | POA: Diagnosis not present

## 2024-09-16 NOTE — Telephone Encounter (Signed)
 Copied from CRM [id number]. Topic: Clinical - Home Health Verbal Orders >> Sep 16, 2024 11:29 AM Kevelyn M wrote: Caller/Agency: Tracy/ [facility] Callback Number: [phone] Service Requested: Pallative Care Frequency: Not sure Any new concerns about the patient? No   LVM FOR TRACY

## 2024-09-20 NOTE — Progress Notes (Signed)
 Remote ICD Transmission

## 2024-09-21 DIAGNOSIS — F0390 Unspecified dementia without behavioral disturbance: Secondary | ICD-10-CM | POA: Diagnosis not present

## 2024-09-21 DIAGNOSIS — M1990 Unspecified osteoarthritis, unspecified site: Secondary | ICD-10-CM | POA: Diagnosis not present

## 2024-09-21 DIAGNOSIS — I4821 Permanent atrial fibrillation: Secondary | ICD-10-CM | POA: Diagnosis not present

## 2024-09-21 DIAGNOSIS — E876 Hypokalemia: Secondary | ICD-10-CM | POA: Diagnosis not present

## 2024-09-21 DIAGNOSIS — I2510 Atherosclerotic heart disease of native coronary artery without angina pectoris: Secondary | ICD-10-CM | POA: Diagnosis not present

## 2024-09-21 DIAGNOSIS — I255 Ischemic cardiomyopathy: Secondary | ICD-10-CM | POA: Diagnosis not present

## 2024-09-21 DIAGNOSIS — I252 Old myocardial infarction: Secondary | ICD-10-CM | POA: Diagnosis not present

## 2024-09-21 DIAGNOSIS — N184 Chronic kidney disease, stage 4 (severe): Secondary | ICD-10-CM | POA: Diagnosis not present

## 2024-09-21 DIAGNOSIS — I5043 Acute on chronic combined systolic (congestive) and diastolic (congestive) heart failure: Secondary | ICD-10-CM | POA: Diagnosis not present

## 2024-09-21 DIAGNOSIS — I130 Hypertensive heart and chronic kidney disease with heart failure and stage 1 through stage 4 chronic kidney disease, or unspecified chronic kidney disease: Secondary | ICD-10-CM | POA: Diagnosis not present

## 2024-09-21 DIAGNOSIS — E785 Hyperlipidemia, unspecified: Secondary | ICD-10-CM | POA: Diagnosis not present

## 2024-09-21 DIAGNOSIS — I739 Peripheral vascular disease, unspecified: Secondary | ICD-10-CM | POA: Diagnosis not present

## 2024-09-22 ENCOUNTER — Telehealth: Payer: Self-pay

## 2024-09-22 DIAGNOSIS — M1990 Unspecified osteoarthritis, unspecified site: Secondary | ICD-10-CM | POA: Diagnosis not present

## 2024-09-22 DIAGNOSIS — I252 Old myocardial infarction: Secondary | ICD-10-CM | POA: Diagnosis not present

## 2024-09-22 DIAGNOSIS — I4821 Permanent atrial fibrillation: Secondary | ICD-10-CM | POA: Diagnosis not present

## 2024-09-22 DIAGNOSIS — I130 Hypertensive heart and chronic kidney disease with heart failure and stage 1 through stage 4 chronic kidney disease, or unspecified chronic kidney disease: Secondary | ICD-10-CM | POA: Diagnosis not present

## 2024-09-22 DIAGNOSIS — E876 Hypokalemia: Secondary | ICD-10-CM | POA: Diagnosis not present

## 2024-09-22 DIAGNOSIS — E785 Hyperlipidemia, unspecified: Secondary | ICD-10-CM | POA: Diagnosis not present

## 2024-09-22 DIAGNOSIS — F0390 Unspecified dementia without behavioral disturbance: Secondary | ICD-10-CM | POA: Diagnosis not present

## 2024-09-22 DIAGNOSIS — I2510 Atherosclerotic heart disease of native coronary artery without angina pectoris: Secondary | ICD-10-CM | POA: Diagnosis not present

## 2024-09-22 DIAGNOSIS — I255 Ischemic cardiomyopathy: Secondary | ICD-10-CM | POA: Diagnosis not present

## 2024-09-22 DIAGNOSIS — N184 Chronic kidney disease, stage 4 (severe): Secondary | ICD-10-CM | POA: Diagnosis not present

## 2024-09-22 DIAGNOSIS — I5043 Acute on chronic combined systolic (congestive) and diastolic (congestive) heart failure: Secondary | ICD-10-CM | POA: Diagnosis not present

## 2024-09-22 DIAGNOSIS — I739 Peripheral vascular disease, unspecified: Secondary | ICD-10-CM | POA: Diagnosis not present

## 2024-09-22 NOTE — Telephone Encounter (Signed)
 Copied from CRM #[id number]. Topic: General - Other >> Sep 22, 2024  1:41 PM Amy B wrote: Reason for CRM: Patient returned call to [employer].  Called CAL but clinic is still out to lunch.  Patient's rep states she will call back.  LVM FOR MRS.Dicostanzo.

## 2024-09-22 NOTE — Telephone Encounter (Signed)
 Copied from CRM #[id number]. Topic: Clinical - Home Health Verbal Orders >> Sep 22, 2024  8:26 AM Myrick T wrote: Caller/Agency: Mitzie Browner from [facility] Callback Number: [phone] Service Requested: Social Worker Consult Frequency: 1 Any new concerns about the patient? Yes Nobody is with the patient and there was a puddle under the chair with plastic and padding over the chair. Wife works. She was spoken to through the ring camera about how to get in the house. He will need care in the home. He will need to be seen for wound care. Another nurse suggested he wear a condom catherter. He has cognitive deficit as he was unable to answer her questions.   LVM- FOR LAURIE PAPER FAXED FOR [facility]  LVM FOR WIFE TO DISCUSS SOMEONE BEING HOME W/ PT.

## 2024-09-23 ENCOUNTER — Telehealth: Payer: Self-pay | Admitting: Nurse Practitioner

## 2024-09-23 ENCOUNTER — Other Ambulatory Visit: Payer: Self-pay | Admitting: Nurse Practitioner

## 2024-09-23 DIAGNOSIS — I5022 Chronic systolic (congestive) heart failure: Secondary | ICD-10-CM

## 2024-09-23 DIAGNOSIS — I4819 Other persistent atrial fibrillation: Secondary | ICD-10-CM

## 2024-09-23 DIAGNOSIS — F03A0 Unspecified dementia, mild, without behavioral disturbance, psychotic disturbance, mood disturbance, and anxiety: Secondary | ICD-10-CM

## 2024-09-23 NOTE — Progress Notes (Signed)
 Called daughter Connor Burns to discuss the most recent phone call received from the home health nurse who was concerned about the care he is receiving.  They have ordered social work to make a visit.  The daughter did express interest in [facility].  I did put an order in for remote [facility] lipase will probably be a better option for him.  I have placed an order for [facility]. He has an appointment on October 15 at 4:00 she plans to be here with him.  I expressed to her it is imperative that he comes to that appointment.  She verbalized understanding and plans to be present during the visit.

## 2024-09-23 NOTE — Telephone Encounter (Signed)
 Spoke with Mitzie Browner (SN) from home health requesting orders for his open wound (venous stasis ulcer). Verbal orders given to discontinue previous orders for silvadene  cream to areas. She is to cleanse with wound cleanser or saline, pat dry and apply aquacell, wrap twice with wrap two times a week. She also informed again the social worker will be making a visit. She is concerned about the patient getting proper care. We have sent a message to the wife to contact the office ASAP to discuss concerns and schedule an appt. We have also placed a referral to [facility] to see the patient at home. Will reach out to the wife again via his mychart.

## 2024-09-27 ENCOUNTER — Ambulatory Visit (INDEPENDENT_AMBULATORY_CARE_PROVIDER_SITE_OTHER): Admitting: Nurse Practitioner

## 2024-09-27 ENCOUNTER — Encounter: Payer: Self-pay | Admitting: Nurse Practitioner

## 2024-09-27 VITALS — BP 110/72 | HR 81 | Temp 97.7°F | Ht 74.0 in | Wt 285.8 lb

## 2024-09-27 DIAGNOSIS — E66812 Obesity, class 2: Secondary | ICD-10-CM

## 2024-09-27 DIAGNOSIS — Z2821 Immunization not carried out because of patient refusal: Secondary | ICD-10-CM

## 2024-09-27 DIAGNOSIS — L97919 Non-pressure chronic ulcer of unspecified part of right lower leg with unspecified severity: Secondary | ICD-10-CM | POA: Diagnosis not present

## 2024-09-27 DIAGNOSIS — R945 Abnormal results of liver function studies: Secondary | ICD-10-CM | POA: Diagnosis not present

## 2024-09-27 DIAGNOSIS — I110 Hypertensive heart disease with heart failure: Secondary | ICD-10-CM

## 2024-09-27 DIAGNOSIS — I83029 Varicose veins of left lower extremity with ulcer of unspecified site: Secondary | ICD-10-CM

## 2024-09-27 DIAGNOSIS — I83019 Varicose veins of right lower extremity with ulcer of unspecified site: Secondary | ICD-10-CM

## 2024-09-27 DIAGNOSIS — L97929 Non-pressure chronic ulcer of unspecified part of left lower leg with unspecified severity: Secondary | ICD-10-CM

## 2024-09-27 DIAGNOSIS — R600 Localized edema: Secondary | ICD-10-CM | POA: Diagnosis not present

## 2024-09-27 DIAGNOSIS — S81809D Unspecified open wound, unspecified lower leg, subsequent encounter: Secondary | ICD-10-CM

## 2024-09-27 DIAGNOSIS — F03A0 Unspecified dementia, mild, without behavioral disturbance, psychotic disturbance, mood disturbance, and anxiety: Secondary | ICD-10-CM | POA: Diagnosis not present

## 2024-09-27 DIAGNOSIS — I5022 Chronic systolic (congestive) heart failure: Secondary | ICD-10-CM | POA: Diagnosis not present

## 2024-09-27 DIAGNOSIS — I4819 Other persistent atrial fibrillation: Secondary | ICD-10-CM

## 2024-09-27 DIAGNOSIS — R7309 Other abnormal glucose: Secondary | ICD-10-CM | POA: Diagnosis not present

## 2024-09-27 DIAGNOSIS — I4821 Permanent atrial fibrillation: Secondary | ICD-10-CM | POA: Diagnosis not present

## 2024-09-27 DIAGNOSIS — Z6836 Body mass index (BMI) 36.0-36.9, adult: Secondary | ICD-10-CM

## 2024-09-27 DIAGNOSIS — E6609 Other obesity due to excess calories: Secondary | ICD-10-CM

## 2024-09-27 DIAGNOSIS — Z95810 Presence of automatic (implantable) cardiac defibrillator: Secondary | ICD-10-CM

## 2024-09-27 NOTE — Progress Notes (Signed)
 I,[name], CMA,acting as a Neurosurgeon for [employer], FNP.,have documented all relevant documentation on the behalf of Connor Ada, FNP,as directed by  Connor Ada, FNP while in the presence of Connor Ada, FNP.  Subjective:  Patient ID: Connor Burns. , male    DOB: June 10, 1956 , 68 y.o.   MRN: [id number]  Chief Complaint  Patient presents with   Hypertension    Patient presents today for a bp and dm follow up, Patient reports compliance with medication. Patient denies any chest pain, SOB, or headaches.    Knee Pain    Patient reports he has been having bilateral knee pain and swelling.      HPI  Discussed the use of AI scribe software for clinical note transcription with the patient, who gave verbal consent to proceed.  History of Present Illness Connor Burns. is a 68 year old male with congestive heart failure who presents for follow-up after a recent hospitalization for CHF exacerbation. He is accompanied by his primary caregiver his wife and daughter  He was hospitalized from September 4th to September 10th due to an exacerbation of congestive heart failure, characterized by fluid overload. He has been inconsistently adhering to his medication regimen, taking four pills in the morning and four at night, but sometimes forgetting doses. His caregiver assists with medication management and ensures he takes his medications before he leaves for work.  He has a history of leg wounds due to swelling and fluid retention, which have been treated at the wound center by Dr. Delon Elder. He had been going to the wound center, but stopped after his recent hospitalization. His caregiver manages the wounds with Silvadene  bandages and medicated dry pads.  He is home alone during the day while his caregiver works. Meals are prepared for him, including breakfast and snacks like fruits and vegetables, to ensure he maintains a healthy diet. He is capable of eating independently but  sometimes requests additional food.  He has a history of yellowing eyes, which was noted during a previous hospitalization in [place]  in June. He underwent testing for hepatitis A, B, and C, but the results are not detailed. His caregiver is concerned about his liver function.  He has been seen by a pulmonary doctor and underwent a pulmonary function test, but the follow-up appointment was rescheduled due to the doctor's unavailability. He is awaiting rescheduling to discuss the results.   Past Medical History:  Diagnosis Date   Acute osteomyelitis of ankle and foot 05/17/2014   Acute systolic heart failure (HCC) 09/12/2023   Arthritis 10/27/2020   Wrist   Atherosclerosis of native arteries of the extremities with ulceration 06/15/2014   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   veral times a day for 10 minutes each time; The patient was also instructed to hold his Xarelto  tonight and tomorrow night.  He is to restart his Xarelto  on Sunday night.   CAD (coronary artery disease)    CHF (congestive heart failure) 01/14/2017   Intermittent CHF with lower extremity edema.  I have given him Lasix  to take on a p.r.n. basis.Formatting of this note might be different from the original. Last Assessment & Plan: Formatting of this note might be different from the original. Intermittent CHF with lower extremity edema.  I have given him Las   Chronic kidney disease 10/27/2020   Stage II   Congestive cardiomyopathy 01/14/2017   Cardiomyopathy with LV dysfunction.Last ejection fraction 40-45% by echocardiography in 2012.Patient currently not taking  beta-blocker Ace inhibitor for some reason.We will repeat echocardiography to evaluate LV function then determine the need for afterload reduction and beta-blocker therapy.[place]   Coronary atherosclerosis 01/14/2017   Status remote CABG in 2004 in [place] .Left internal mammary artery to the obtuse marginal.Free Right internal  mammary artery to the LAD.Saphenous vein graft to OM 2.Saphenous vein graft to the RCA.  Asymptomatic without chest pain or shortness of breath.Last nuclear stress test May 2018:  Large   DVT (deep venous thrombosis)    Dyslipidemia 05/07/2018   Hematuria 08/10/2015   Hypertension    ICD (implantable cardioverter-defibrillator) battery depletion 06/16/2018   Status post dual-chamber ICD generator change 06/16/2018.Last Assessment & Plan: Formatting of this note might be different from the original.Left infraclavicular incision intact with Dermabond.  There is no pocket hematoma present.  No drainage noted.Device interrogation this a.m. reveals appropriate device function.   Ischemic cardiomyopathy 06/18/2016   Echo 09/14/2011 showing LA 4.9cm, EF 40-45%.  Mild to moderate decreased LV systolic function.  Mild concentric LVH.  LA is moderately dilated.  Mild aortic cusp sclerosis. Aortic valve appears to be bicuspid.B. Concerning for potential dysrhythmia (ventricular tachycardia) especially given documented nonsustained ventricular tachy   Mild dementia, unclear etiology 12/30/2022   Myocardial infarction    Obesity (BMI 30-39.9) 08/10/2015   Onychomycosis 05/10/2014   Painful legs and moving toes of left foot 05/10/2014   Paroxysmal atrial fibrillation 08/08/2015   A. Paroxysmal.B. On Sotalol therapy.Last Assessment & Plan: History of present atrial fibrillation.  Patient will 80 mg twice daily.Also on rivaroxaban  20 mg a day.Patient again refused an ECG.I explained to the patient, that the absence of an EKG, I canno   Peripheral vascular disease    Shortness of breath 10/27/2020   Exertional   Stroke 10/27/2020   Right frontal infarct per neuroimaging dating back at least to 2013   Syncope 01/14/2017   In the setting of ischemic cardiomyopathy. B. Concerning for potential dysrhythmia (ventricular tachycardia) especially given documented nonsustained ventricular tachycardia - EPS 9/25/201  showing reproducibly inducible nonsustained monomorphic VT up to 10 beat.  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 In the setting of ischemic cardiomyopathy. B. Concerning for potential dysrhythmia (ventricular tachycardia) especially given documented nonsustained ventricular tachycardia - EPS 9/25/201 showing reproducibly inducible nonsustained monomorphic VT up to 10 beat.  Reproducibly inducible nonsustained polymorphic VT.  Inducible VF wi     Family History  Problem Relation Age of Onset   Heart disease Mother        before age 87   Hyperlipidemia Mother    Hypertension Mother    Heart attack Mother    Hyperlipidemia Father    Hypertension Father    Memory loss Brother    Memory loss Brother    Memory loss Half-Sister    Memory loss Maternal Aunt      Current Outpatient Medications:    apixaban  (ELIQUIS ) 5 MG TABS tablet, Take 1 tablet (5 mg total) by mouth 2 (two) times daily., Disp: 60 tablet, Rfl: 3   donepezil  (ARICEPT ) 10 MG tablet, Take 1 tablet (10 mg total) by mouth daily., Disp: 90 tablet, Rfl: 3   empagliflozin  (JARDIANCE ) 10 MG TABS tablet, Take 1 tablet (10 mg total) by mouth daily., Disp: 90 tablet, Rfl: 3   potassium chloride  SA (KLOR-CON  M) 20 MEQ tablet, Take 2 tablets (40 mEq  total) by mouth 2 (two) times daily., Disp: 120 tablet, Rfl: 0   risperiDONE  (RISPERDAL ) 0.5 MG tablet, Take 1 tablet (0.5 mg total) by mouth at bedtime., Disp: 30 tablet, Rfl: 0   torsemide  (DEMADEX ) 20 MG tablet, Take 4 tablets (80 mg total) by mouth 2 (two) times daily., Disp: 240 tablet, Rfl: 5   atorvastatin  (LIPITOR) 20 MG tablet, Take 1 tablet (20 mg total) by mouth daily. (Patient not taking: Reported on 09/27/2024), Disp: 30 tablet, Rfl: 2   carvedilol  (COREG ) 3.125 MG tablet, Take 1 tablet (3.125 mg total) by mouth 2 (two) times daily.  (Patient not taking: Reported on 09/27/2024), Disp: 60 tablet, Rfl: 2   ciclopirox  (PENLAC ) 8 % solution, Apply topically at bedtime. Apply over nail and surrounding skin. Apply daily over previous coat. After seven (7) days, may remove with alcohol and continue cycle. (Patient not taking: Reported on 09/27/2024), Disp: 6.6 mL, Rfl: 12   cyanocobalamin  (VITAMIN B12) 1000 MCG tablet, Take 1 tablet (1,000 mcg total) by mouth daily. (Patient not taking: Reported on 09/27/2024), Disp: 30 tablet, Rfl: 2   [Paused] Furosemide  (FUROSCIX ) 80 MG/10ML CTKT, Inject 80 mg into the skin daily as needed. (Patient not taking: Reported on 09/27/2024), Disp: 5 each, Rfl: 2   isosorbide  mononitrate (IMDUR ) 30 MG 24 hr tablet, Take 1 tablet by mouth once daily, Disp: 30 tablet, Rfl: 0   silver  sulfADIAZINE  (SILVADENE ) 1 % cream, Apply 1 Application topically 2 (two) times daily. Clean site of wound and apply cream. (Patient not taking: Reported on 09/27/2024), Disp: 400 g, Rfl: 1   SV IRON 325 (65 Fe) MG tablet, Take 325 mg by mouth 3 (three) times a week. (Patient not taking: Reported on 09/27/2024), Disp: , Rfl:    Allergies  Allergen Reactions   Clopidogrel Anaphylaxis, Swelling, Other (See Comments) and Cough    Out of body experience      Review of Systems  Constitutional: Negative.   HENT: Negative.    Eyes: Negative.   Respiratory:  Positive for shortness of breath. Negative for cough and wheezing.   Cardiovascular: Negative.  Negative for chest pain, palpitations and leg swelling.  Gastrointestinal: Negative.   Musculoskeletal: Negative.   Skin:  Positive for wound (bilateral lower extremity with open wounds).  Neurological: Negative.   Psychiatric/Behavioral: Negative.       Today's Vitals   09/27/24 1617  BP: 110/72  Pulse: 81  Temp: 97.7 F (36.5 C)  TempSrc: Oral  SpO2: 99%  Weight: 285 lb 12.8 oz (129.6 kg)  Height: 6' 2 (1.88 m)  PainSc: 0-No pain   Body mass index is 36.69 kg/m.  Wt  Readings from Last 3 Encounters:  09/27/24 285 lb 12.8 oz (129.6 kg)  09/01/24 276 lb 14.4 oz (125.6 kg)  08/24/24 (!) 303 lb 3.2 oz (137.5 kg)    The ASCVD Risk score (Arnett DK, et al., 2019) failed to calculate for the following reasons:   Risk score cannot be calculated because patient has a medical history suggesting prior/existing ASCVD  Objective:  Physical Exam Vitals and nursing note reviewed.  Constitutional:      General: He is not in acute distress.    Appearance: Normal appearance. He is obese.  Cardiovascular:     Rate and Rhythm: Normal rate and regular rhythm.     Pulses: Normal pulses.     Heart sounds: Normal heart sounds. No murmur heard. Pulmonary:     Effort: Pulmonary effort is normal. No respiratory  distress.     Breath sounds: No decreased breath sounds or wheezing.     Comments: He is speaking in complete sentences Chest:     Chest wall: No mass.  Musculoskeletal:        General: No swelling.     Right lower leg: Edema (legs are wrapped by home health/wound center) present.     Left lower leg: Edema (legs are wrapped by home health/wound center) present.  Skin:    General: Skin is warm and dry.     Capillary Refill: Capillary refill takes less than 2 seconds.  Neurological:     General: No focal deficit present.     Mental Status: He is alert and oriented to person, place, and time.     Cranial Nerves: No cranial nerve deficit.     Motor: No weakness.  Psychiatric:        Mood and Affect: Mood normal. Mood is not anxious.        Behavior: Behavior normal.        Thought Content: Thought content normal.        Judgment: Judgment normal.         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 Hospitalized for fluid overload. Memory issues affecting medication management. - Continue home health nurse visits. - Refer  to PACE program for comprehensive care and support. - Educate on importance of medication adherence. - Discuss potential for Adult Protective Services involvement if safety concerns persist.   Abnormal glucose -     Hemoglobin A1c  Persistent atrial fibrillation (HCC) -     Comprehensive metabolic panel with GFR  Influenza vaccination declined  Class 2 obesity due to excess calories with body mass index (BMI) of 36.0 to 36.9 in adult, unspecified whether serious comorbidity present  Mild dementia without behavioral disturbance, psychotic disturbance, mood disturbance, or anxiety, unspecified dementia type (HCC)  Automatic implantable cardioverter-defibrillator in situ  Atrial fibrillation, permanent (HCC)  Bilateral lower extremity edema -     Ambulatory referral to Wound Clinic  Venous ulcers of both lower extremities (HCC) Assessment & Plan: Chronic venous ulcers due to fluid retention. Lapse in wound care follow-up post-hospitalization. - Refer to wound center for ongoing management. - Ensure regular appointments with wound care specialist Dr. Delon Elder.  Orders: -     Ambulatory referral to Wound Clinic  Abnormal liver function Assessment & Plan: Yellowing of eyes suggests hepatic dysfunction. Awaiting records from previous hospitalization for dehydration and liver issues. - Order liver function tests. - Review records from [facility] for previous liver assessments.     Return for controlled DM check 4 months.  Patient was given opportunity to ask questions. Patient verbalized understanding of the plan and was able to repeat key elements of the plan. All questions were answered to their satisfaction.    LILLETTE Connor Ada, FNP, have reviewed all documentation for this visit. The documentation on 09/27/24 for the exam, diagnosis, procedures, and orders are all accurate and complete.   IF YOU HAVE BEEN REFERRED TO A SPECIALIST, IT MAY TAKE 1-2 WEEKS TO  SCHEDULE/PROCESS THE REFERRAL. IF YOU HAVE NOT HEARD FROM US /SPECIALIST IN TWO WEEKS, PLEASE GIVE US  A CALL AT [phone] X [phone].

## 2024-09-27 NOTE — Progress Notes (Deleted)
 j

## 2024-09-28 ENCOUNTER — Ambulatory Visit: Payer: Self-pay | Admitting: Nurse Practitioner

## 2024-09-28 DIAGNOSIS — I5043 Acute on chronic combined systolic (congestive) and diastolic (congestive) heart failure: Secondary | ICD-10-CM | POA: Diagnosis not present

## 2024-09-28 DIAGNOSIS — F0390 Unspecified dementia without behavioral disturbance: Secondary | ICD-10-CM | POA: Diagnosis not present

## 2024-09-28 DIAGNOSIS — N184 Chronic kidney disease, stage 4 (severe): Secondary | ICD-10-CM | POA: Diagnosis not present

## 2024-09-28 DIAGNOSIS — M1990 Unspecified osteoarthritis, unspecified site: Secondary | ICD-10-CM | POA: Diagnosis not present

## 2024-09-28 DIAGNOSIS — I4821 Permanent atrial fibrillation: Secondary | ICD-10-CM | POA: Diagnosis not present

## 2024-09-28 DIAGNOSIS — I252 Old myocardial infarction: Secondary | ICD-10-CM | POA: Diagnosis not present

## 2024-09-28 DIAGNOSIS — R748 Abnormal levels of other serum enzymes: Secondary | ICD-10-CM

## 2024-09-28 DIAGNOSIS — I2510 Atherosclerotic heart disease of native coronary artery without angina pectoris: Secondary | ICD-10-CM | POA: Diagnosis not present

## 2024-09-28 DIAGNOSIS — I130 Hypertensive heart and chronic kidney disease with heart failure and stage 1 through stage 4 chronic kidney disease, or unspecified chronic kidney disease: Secondary | ICD-10-CM | POA: Diagnosis not present

## 2024-09-28 DIAGNOSIS — I255 Ischemic cardiomyopathy: Secondary | ICD-10-CM | POA: Diagnosis not present

## 2024-09-28 DIAGNOSIS — I739 Peripheral vascular disease, unspecified: Secondary | ICD-10-CM | POA: Diagnosis not present

## 2024-09-28 DIAGNOSIS — E876 Hypokalemia: Secondary | ICD-10-CM | POA: Diagnosis not present

## 2024-09-28 DIAGNOSIS — E785 Hyperlipidemia, unspecified: Secondary | ICD-10-CM | POA: Diagnosis not present

## 2024-09-28 LAB — HEMOGLOBIN A1C
Est. average glucose Bld gHb Est-mCnc: 108 mg/dL
Hgb A1c MFr Bld: 5.4 % (ref 4.8–5.6)

## 2024-09-28 LAB — COMPREHENSIVE METABOLIC PANEL WITH GFR
ALT: 19 IU/L (ref 0–44)
AST: 36 IU/L (ref 0–40)
Albumin: 3.7 g/dL — ABNORMAL LOW (ref 3.9–4.9)
Alkaline Phosphatase: 319 IU/L — ABNORMAL HIGH (ref 47–123)
BUN/Creatinine Ratio: 15 (ref 10–24)
BUN: 36 mg/dL — ABNORMAL HIGH (ref 8–27)
Bilirubin Total: 6.6 mg/dL — ABNORMAL HIGH (ref 0.0–1.2)
CO2: 25 mmol/L (ref 20–29)
Calcium: 9.1 mg/dL (ref 8.6–10.2)
Chloride: 95 mmol/L — ABNORMAL LOW (ref 96–106)
Creatinine, Ser: 2.33 mg/dL — ABNORMAL HIGH (ref 0.76–1.27)
Globulin, Total: 3 g/dL (ref 1.5–4.5)
Glucose: 101 mg/dL — ABNORMAL HIGH (ref 70–99)
Potassium: 4.2 mmol/L (ref 3.5–5.2)
Sodium: 135 mmol/L (ref 134–144)
Total Protein: 6.7 g/dL (ref 6.0–8.5)
eGFR: 30 mL/min/1.73 — ABNORMAL LOW (ref >59)

## 2024-09-29 DIAGNOSIS — I130 Hypertensive heart and chronic kidney disease with heart failure and stage 1 through stage 4 chronic kidney disease, or unspecified chronic kidney disease: Secondary | ICD-10-CM | POA: Diagnosis not present

## 2024-09-29 DIAGNOSIS — F0390 Unspecified dementia without behavioral disturbance: Secondary | ICD-10-CM | POA: Diagnosis not present

## 2024-09-29 DIAGNOSIS — M1990 Unspecified osteoarthritis, unspecified site: Secondary | ICD-10-CM | POA: Diagnosis not present

## 2024-09-29 DIAGNOSIS — N184 Chronic kidney disease, stage 4 (severe): Secondary | ICD-10-CM | POA: Diagnosis not present

## 2024-09-29 DIAGNOSIS — E785 Hyperlipidemia, unspecified: Secondary | ICD-10-CM | POA: Diagnosis not present

## 2024-09-29 DIAGNOSIS — I255 Ischemic cardiomyopathy: Secondary | ICD-10-CM | POA: Diagnosis not present

## 2024-09-29 DIAGNOSIS — I2510 Atherosclerotic heart disease of native coronary artery without angina pectoris: Secondary | ICD-10-CM | POA: Diagnosis not present

## 2024-09-29 DIAGNOSIS — I5043 Acute on chronic combined systolic (congestive) and diastolic (congestive) heart failure: Secondary | ICD-10-CM | POA: Diagnosis not present

## 2024-09-29 DIAGNOSIS — E876 Hypokalemia: Secondary | ICD-10-CM | POA: Diagnosis not present

## 2024-09-29 DIAGNOSIS — I4821 Permanent atrial fibrillation: Secondary | ICD-10-CM | POA: Diagnosis not present

## 2024-09-29 DIAGNOSIS — I739 Peripheral vascular disease, unspecified: Secondary | ICD-10-CM | POA: Diagnosis not present

## 2024-09-29 DIAGNOSIS — I252 Old myocardial infarction: Secondary | ICD-10-CM | POA: Diagnosis not present

## 2024-10-02 ENCOUNTER — Other Ambulatory Visit: Payer: Self-pay | Admitting: Nurse Practitioner

## 2024-10-05 ENCOUNTER — Ambulatory Visit: Admitting: Nurse Practitioner

## 2024-10-06 ENCOUNTER — Ambulatory Visit: Payer: Self-pay | Admitting: Nurse Practitioner

## 2024-10-06 DIAGNOSIS — E876 Hypokalemia: Secondary | ICD-10-CM | POA: Diagnosis not present

## 2024-10-06 DIAGNOSIS — I2510 Atherosclerotic heart disease of native coronary artery without angina pectoris: Secondary | ICD-10-CM | POA: Diagnosis not present

## 2024-10-06 DIAGNOSIS — I4821 Permanent atrial fibrillation: Secondary | ICD-10-CM | POA: Diagnosis not present

## 2024-10-06 DIAGNOSIS — M1990 Unspecified osteoarthritis, unspecified site: Secondary | ICD-10-CM | POA: Diagnosis not present

## 2024-10-06 DIAGNOSIS — F0390 Unspecified dementia without behavioral disturbance: Secondary | ICD-10-CM | POA: Diagnosis not present

## 2024-10-06 DIAGNOSIS — I739 Peripheral vascular disease, unspecified: Secondary | ICD-10-CM | POA: Diagnosis not present

## 2024-10-06 DIAGNOSIS — R945 Abnormal results of liver function studies: Secondary | ICD-10-CM | POA: Insufficient documentation

## 2024-10-06 DIAGNOSIS — L97929 Non-pressure chronic ulcer of unspecified part of left lower leg with unspecified severity: Secondary | ICD-10-CM | POA: Insufficient documentation

## 2024-10-06 DIAGNOSIS — I5043 Acute on chronic combined systolic (congestive) and diastolic (congestive) heart failure: Secondary | ICD-10-CM | POA: Diagnosis not present

## 2024-10-06 DIAGNOSIS — R600 Localized edema: Secondary | ICD-10-CM | POA: Insufficient documentation

## 2024-10-06 DIAGNOSIS — I255 Ischemic cardiomyopathy: Secondary | ICD-10-CM | POA: Diagnosis not present

## 2024-10-06 DIAGNOSIS — E785 Hyperlipidemia, unspecified: Secondary | ICD-10-CM | POA: Diagnosis not present

## 2024-10-06 DIAGNOSIS — I252 Old myocardial infarction: Secondary | ICD-10-CM | POA: Diagnosis not present

## 2024-10-06 DIAGNOSIS — N184 Chronic kidney disease, stage 4 (severe): Secondary | ICD-10-CM | POA: Diagnosis not present

## 2024-10-06 DIAGNOSIS — I130 Hypertensive heart and chronic kidney disease with heart failure and stage 1 through stage 4 chronic kidney disease, or unspecified chronic kidney disease: Secondary | ICD-10-CM | POA: Diagnosis not present

## 2024-10-06 NOTE — Assessment & Plan Note (Signed)
 Chronic venous ulcers due to fluid retention. Lapse in wound care follow-up post-hospitalization. - Refer to wound center for ongoing management. - Ensure regular appointments with wound care specialist Dr. Delon Elder.

## 2024-10-06 NOTE — Assessment & Plan Note (Signed)
 Yellowing of eyes suggests hepatic dysfunction. Awaiting records from previous hospitalization for dehydration and liver issues. - Order liver function tests. - Review records from [facility] for previous liver assessments.

## 2024-10-06 NOTE — Assessment & Plan Note (Signed)
 Recent CHF exacerbation due to medication non-compliance. Hospitalized for fluid overload. Memory issues affecting medication management. - Continue home health nurse visits. - Refer to PACE program for comprehensive care and support. - Educate on importance of medication adherence. - Discuss potential for Adult Protective Services involvement if safety concerns persist.

## 2024-10-10 DIAGNOSIS — R0602 Shortness of breath: Secondary | ICD-10-CM | POA: Diagnosis not present

## 2024-10-11 ENCOUNTER — Encounter (HOSPITAL_BASED_OUTPATIENT_CLINIC_OR_DEPARTMENT_OTHER): Attending: General Surgery | Admitting: General Surgery

## 2024-10-11 DIAGNOSIS — N1832 Chronic kidney disease, stage 3b: Secondary | ICD-10-CM | POA: Insufficient documentation

## 2024-10-11 DIAGNOSIS — I5022 Chronic systolic (congestive) heart failure: Secondary | ICD-10-CM | POA: Diagnosis not present

## 2024-10-11 DIAGNOSIS — R600 Localized edema: Secondary | ICD-10-CM | POA: Insufficient documentation

## 2024-10-11 DIAGNOSIS — L97829 Non-pressure chronic ulcer of other part of left lower leg with unspecified severity: Secondary | ICD-10-CM | POA: Insufficient documentation

## 2024-10-11 DIAGNOSIS — I739 Peripheral vascular disease, unspecified: Secondary | ICD-10-CM | POA: Diagnosis not present

## 2024-10-11 DIAGNOSIS — F03A0 Unspecified dementia, mild, without behavioral disturbance, psychotic disturbance, mood disturbance, and anxiety: Secondary | ICD-10-CM | POA: Diagnosis not present

## 2024-10-11 DIAGNOSIS — I890 Lymphedema, not elsewhere classified: Secondary | ICD-10-CM | POA: Diagnosis not present

## 2024-10-11 DIAGNOSIS — L97819 Non-pressure chronic ulcer of other part of right lower leg with unspecified severity: Secondary | ICD-10-CM | POA: Diagnosis present

## 2024-10-13 ENCOUNTER — Ambulatory Visit: Payer: Self-pay

## 2024-10-13 ENCOUNTER — Other Ambulatory Visit: Payer: Self-pay | Admitting: Nurse Practitioner

## 2024-10-13 DIAGNOSIS — L97819 Non-pressure chronic ulcer of other part of right lower leg with unspecified severity: Secondary | ICD-10-CM | POA: Diagnosis not present

## 2024-10-13 DIAGNOSIS — I110 Hypertensive heart disease with heart failure: Secondary | ICD-10-CM

## 2024-10-13 DIAGNOSIS — L97429 Non-pressure chronic ulcer of left heel and midfoot with unspecified severity: Secondary | ICD-10-CM | POA: Diagnosis not present

## 2024-10-13 DIAGNOSIS — L97829 Non-pressure chronic ulcer of other part of left lower leg with unspecified severity: Secondary | ICD-10-CM | POA: Diagnosis not present

## 2024-10-13 DIAGNOSIS — I5022 Chronic systolic (congestive) heart failure: Secondary | ICD-10-CM

## 2024-10-13 DIAGNOSIS — Z0000 Encounter for general adult medical examination without abnormal findings: Secondary | ICD-10-CM | POA: Diagnosis not present

## 2024-10-13 DIAGNOSIS — F03A0 Unspecified dementia, mild, without behavioral disturbance, psychotic disturbance, mood disturbance, and anxiety: Secondary | ICD-10-CM

## 2024-10-13 DIAGNOSIS — Z95810 Presence of automatic (implantable) cardiac defibrillator: Secondary | ICD-10-CM

## 2024-10-13 DIAGNOSIS — L97519 Non-pressure chronic ulcer of other part of right foot with unspecified severity: Secondary | ICD-10-CM | POA: Diagnosis not present

## 2024-10-13 NOTE — Progress Notes (Signed)
 Subjective:   Connor Bechard Jr. is a 68 y.o. who presents for a Medicare Wellness preventive visit.  As a reminder, Annual Wellness Visits don't include a physical exam, and some assessments may be limited, especially if this visit is performed virtually. We may recommend an in-person follow-up visit with your provider if needed.  Visit Complete: Virtual I connected with  Connor Lugar Jr. on 10/13/24 by a audio enabled telemedicine application and verified that I am speaking with the correct person using two identifiers.  Patient Location: Home  Provider Location: Office/Clinic  I discussed the limitations of evaluation and management by telemedicine. The patient expressed understanding and agreed to proceed.  Vital Signs: Because this visit was a virtual/telehealth visit, some criteria may be missing or patient reported. Any vitals not documented were not able to be obtained and vitals that have been documented are patient reported.  VideoError- Librarian, academic were attempted between this provider and patient, however failed, due to patient having technical difficulties OR patient did not have access to video capability.  We continued and completed visit with audio only.   Persons Participating in Visit: Patient assisted by spouse.  AWV Questionnaire: No: Patient Medicare AWV questionnaire was not completed prior to this visit.  Cardiac Risk Factors include: advanced age (>60men, >1 women);hypertension;male gender     Objective:    Today's Vitals   There is no height or weight on file to calculate BMI.     10/13/2024    2:14 PM 08/26/2024    5:59 AM 07/07/2024   12:58 PM 09/22/2023   12:24 PM 09/12/2023   11:00 PM 09/12/2023   12:16 PM 07/28/2023    4:15 PM  Advanced Directives  Does Patient Have a Medical Advance Directive? No No Yes No No No No  Type of Advance Directive   Healthcare Power of Attorney      Does patient want to make changes to  medical advance directive?   No - Patient declined      Copy of Healthcare Power of Attorney in Chart?   No - copy requested      Would patient like information on creating a medical advance directive? No - Patient declined   No - Patient declined No - Patient declined  No - Patient declined    Current Medications (verified) Outpatient Encounter Medications as of 10/13/2024  Medication Sig   apixaban  (ELIQUIS ) 5 MG TABS tablet Take 1 tablet (5 mg total) by mouth 2 (two) times daily.   donepezil  (ARICEPT ) 10 MG tablet Take 1 tablet (10 mg total) by mouth daily.   empagliflozin  (JARDIANCE ) 10 MG TABS tablet Take 1 tablet (10 mg total) by mouth daily.   isosorbide  mononitrate (IMDUR ) 30 MG 24 hr tablet Take 1 tablet by mouth once daily   potassium chloride  SA (KLOR-CON  M) 20 MEQ tablet Take 2 tablets (40 mEq total) by mouth 2 (two) times daily.   torsemide  (DEMADEX ) 20 MG tablet Take 4 tablets (80 mg total) by mouth 2 (two) times daily.   atorvastatin  (LIPITOR) 20 MG tablet Take 1 tablet (20 mg total) by mouth daily. (Patient not taking: Reported on 10/13/2024)   carvedilol  (COREG ) 3.125 MG tablet Take 1 tablet (3.125 mg total) by mouth 2 (two) times daily. (Patient not taking: Reported on 10/13/2024)   ciclopirox  (PENLAC ) 8 % solution Apply topically at bedtime. Apply over nail and surrounding skin. Apply daily over previous coat. After seven (7) days, may remove with alcohol  and continue cycle. (Patient not taking: Reported on 10/13/2024)   cyanocobalamin  (VITAMIN B12) 1000 MCG tablet Take 1 tablet (1,000 mcg total) by mouth daily. (Patient not taking: Reported on 10/13/2024)   [Paused] Furosemide  (FUROSCIX ) 80 MG/10ML CTKT Inject 80 mg into the skin daily as needed. (Patient not taking: Reported on 10/13/2024)   risperiDONE  (RISPERDAL ) 0.5 MG tablet Take 1 tablet (0.5 mg total) by mouth at bedtime. (Patient not taking: Reported on 10/13/2024)   silver  sulfADIAZINE  (SILVADENE ) 1 % cream Apply 1  Application topically 2 (two) times daily. Clean site of wound and apply cream. (Patient not taking: Reported on 10/13/2024)   SV IRON 325 (65 Fe) MG tablet Take 325 mg by mouth 3 (three) times a week. (Patient not taking: Reported on 10/13/2024)   No facility-administered encounter medications on file as of 10/13/2024.    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  He is to restart his Xarelto  on Sunday night.   CAD (coronary artery disease)    CHF (congestive heart failure) 01/14/2017   Intermittent CHF with lower extremity edema.  I have given him Lasix  to take on a p.r.n. basis.Formatting of this note might be different from the original. Last Assessment & Plan: Formatting of this note might be different from the original. Intermittent CHF with lower extremity edema.  I have given him Las   Chronic kidney disease 10/27/2020   Stage II   Congestive cardiomyopathy 01/14/2017   Cardiomyopathy with LV dysfunction.Last ejection fraction 40-45% by echocardiography in 2012.Patient currently not taking beta-blocker Ace inhibitor for some reason.We will repeat echocardiography to evaluate LV function then determine the need for afterload reduction and beta-blocker therapy.[place]   Coronary atherosclerosis 01/14/2017   Status remote CABG in 2004 in [place] .Left internal mammary artery to the obtuse marginal.Free Right internal mammary artery to the LAD.Saphenous vein graft to OM 2.Saphenous vein graft to the RCA.  Asymptomatic without chest pain or  shortness of breath.Last nuclear stress test May 2018:  Large   DVT (deep venous thrombosis)    Dyslipidemia 05/07/2018   Hematuria 08/10/2015   Hypertension    ICD (implantable cardioverter-defibrillator) battery depletion 06/16/2018   Status post dual-chamber ICD generator change 06/16/2018.Last Assessment & Plan: Formatting of this note might be different from the original.Left infraclavicular incision intact with Dermabond.  There is no pocket hematoma present.  No drainage noted.Device interrogation this a.m. reveals appropriate device function.   Ischemic cardiomyopathy 06/18/2016   Echo 09/14/2011 showing LA 4.9cm, EF 40-45%.  Mild to moderate decreased LV systolic function.  Mild concentric LVH.  LA is moderately dilated.  Mild aortic cusp sclerosis. Aortic valve appears to be bicuspid.B. Concerning for potential dysrhythmia (ventricular tachycardia) especially given documented nonsustained ventricular tachy   Mild dementia, unclear etiology 12/30/2022   Myocardial infarction    Obesity (BMI 30-39.9) 08/10/2015   Onychomycosis 05/10/2014   Painful legs and moving toes of left foot 05/10/2014   Paroxysmal atrial fibrillation 08/08/2015   A. Paroxysmal.B. On Sotalol therapy.Last Assessment & Plan: History of present atrial fibrillation.  Patient will 80 mg  twice daily.Also on rivaroxaban  20 mg a day.Patient again refused an ECG.I explained to the patient, that the absence of an EKG, I canno   Peripheral vascular disease    Shortness of breath 10/27/2020   Exertional   Stroke 10/27/2020   Right frontal infarct per neuroimaging dating back at least to 2013   Syncope 01/14/2017   In the setting of ischemic cardiomyopathy. B. Concerning for potential dysrhythmia (ventricular tachycardia) especially given documented nonsustained ventricular tachycardia - EPS 9/25/201 showing reproducibly inducible nonsustained monomorphic VT up to 10 beat.  Reproducibly inducible nonsustained polymorphic VT.   Inducible VF wi   Typical atrial flutter 08/08/2015   Ulcer of foot, chronic 10/06/2014   Ventricular tachycardia 01/14/2017   A. In the setting of ischemic cardiomyopathy. B. Concerning for potential dysrhythmia (ventricular tachycardia) especially given documented nonsustained ventricular tachycardia - EPS 9/25/201 showing reproducibly inducible nonsustained monomorphic VT up to 10 beat.  Reproducibly inducible nonsustained polymorphic VT.  Inducible VF wi   Past Surgical History:  Procedure Laterality Date   CARDIAC DEFIBRILLATOR PLACEMENT     CORONARY ARTERY BYPASS GRAFT  10/2003   IR RADIOLOGIST EVAL & MGMT  10/26/2020   PR VEIN BYPASS GRAFT,AORTO-FEM-POP     Family History  Problem Relation Age of Onset   Heart disease Mother        before age 64   Hyperlipidemia Mother    Hypertension Mother    Heart attack Mother    Hyperlipidemia Father    Hypertension Father    Memory loss Brother    Memory loss Brother    Memory loss Half-Sister    Memory loss Maternal Aunt    Social History   Socioeconomic History   Marital status: Married    Spouse name: [name]   Number of children: 1   Years of education: 73   Highest education level: Master's degree (e.g., MA, MS, MEng, MEd, MSW, MBA)  Occupational History   Occupation: Chaplain    Comment: [employer]    Comment: newly retired  Tobacco Use   Smoking status: Never   Smokeless tobacco: Never  Vaping Use   Vaping status: Never Used  Substance and Sexual Activity   Alcohol use: No   Drug use: No   Sexual activity: Not on file  Other Topics Concern   Not on file  Social History Narrative   Lives with wife in a 2 story home.  Has 3 children.     Works as a Metallurgist.     Education: Masters in [employer].   Right handed   Caffeine yes   Social Drivers of Health   Financial Resource Strain: Medium Risk (10/13/2024)   Overall Financial Resource Strain (CARDIA)    Difficulty of Paying Living Expenses: Somewhat  hard  Food Insecurity: Food Insecurity Present (10/13/2024)   Hunger Vital Sign    Worried About Running Out of Food in the Last Year: Sometimes true    Ran Out of Food in the Last Year: Sometimes true  Transportation Needs: No Transportation Needs (10/13/2024)   PRAPARE - Administrator, Civil Service (Medical): No    Lack of Transportation (Non-Medical): No  Physical Activity: Inactive (10/13/2024)   Exercise Vital Sign    Days of Exercise per Week: 0 days    Minutes of Exercise per Session: 0 min  Stress: Stress Concern Present (10/13/2024)   [employer] of Occupational Health - Occupational Stress Questionnaire    Feeling of Stress: To  some extent  Social Connections: Socially Integrated (10/13/2024)   Social Connection and Isolation Panel    Frequency of Communication with Friends and Family: More than three times a week    Frequency of Social Gatherings with Friends and Family: More than three times a week    Attends Religious Services: More than 4 times per year    Active Member of [employer] or Organizations: Yes    Attends Engineer, structural: More than 4 times per year    Marital Status: Married    Tobacco Counseling Counseling given: Not Answered    Clinical Intake:  Pre-visit preparation completed: Yes  Pain : No/denies pain     Nutritional Risks: None Diabetes: No  Lab Results  Component Value Date   HGBA1C 5.4 09/27/2024   HGBA1C 5.2 05/19/2024   HGBA1C 5.8 (H) 02/11/2023     How often do you need to have someone help you when you read instructions, pamphlets, or other written materials from your doctor or pharmacy?: 1 - Never  Interpreter Needed?: No  Information entered by :: NAllen LPN   Activities of Daily Living     10/13/2024    2:05 PM  In your present state of health, do you have any difficulty performing the following activities:  Hearing? 0  Vision? 1  Comment blurry sometimes  Difficulty concentrating or  making decisions? 1  Comment memory sometimes  Walking or climbing stairs? 1  Dressing or bathing? 0  Doing errands, shopping? 1  Comment someone usually goes with  Preparing Food and eating ? N  Using the Toilet? N  In the past six months, have you accidently leaked urine? Y  Do you have problems with loss of bowel control? N  Managing your Medications? Y  Managing your Finances? N  Housekeeping or managing your Housekeeping? Y    Patient Care Team: Georgina Speaks, FNP as PCP - General (General Practice) Tobb, Kardie, DO as PCP - Cardiology (Cardiology) Inocencio Soyla Lunger, MD as PCP - Electrophysiology (Cardiology) Monetta Redell PARAS, MD as Consulting Physician (Cardiology) Joya Ade, DPM as Consulting Physician (Podiatry) Morgan, Clayborne CROME, RN as VBCI Care Management  I have updated your Care Teams any recent Medical Services you may have received from other providers in the past year.     Assessment:   This is a routine wellness examination for K[place]ron.  Hearing/Vision screen Hearing Screening - Comments:: Denies hearing issues Vision Screening - Comments:: No regular eye exams,   Goals Addressed             This Visit's Progress    Patient Stated       10/13/2024, walking better, get health in good shape       Depression Screen     10/13/2024    2:16 PM 07/02/2024    9:40 AM 05/19/2024    3:52 PM 03/03/2023    9:05 AM 02/11/2023   12:11 PM  PHQ 2/9 Scores  PHQ - 2 Score 1 0 0 0 0  PHQ- 9 Score  0 0      Fall Risk     10/13/2024    2:15 PM 07/07/2024   12:58 PM 07/02/2024    9:40 AM 07/28/2023    4:15 PM 03/03/2023    9:05 AM  Fall Risk   Falls in the past year? 1 1 1  0 0  Comment slipped coming up steps      Number falls in past yr: 0 1 1  0   Injury with Fall? 0 0 0 0   Risk for fall due to : Impaired mobility;Impaired balance/gait;Medication side effect  History of fall(s)    Follow up Falls prevention discussed;Falls evaluation completed Falls  evaluation completed Falls evaluation completed Falls evaluation completed     MEDICARE RISK AT HOME:  Medicare Risk at Home Any stairs in or around the home?: Yes If so, are there any without handrails?: No Home free of loose throw rugs in walkways, pet beds, electrical cords, etc?: Yes Adequate lighting in your home to reduce risk of falls?: Yes Life alert?: No Use of a cane, walker or w/c?: Yes Grab bars in the bathroom?: No Shower chair or bench in shower?: Yes Elevated toilet seat or a handicapped toilet?: No  TIMED UP AND GO:  Was the test performed?  No  Cognitive Function: Impaired: Patient has current diagnosis of cognitive impairment.    07/07/2024    7:00 PM 07/02/2024    9:56 AM 07/28/2023    5:00 PM  MMSE - Mini Mental State Exam  Not completed:  Unable to complete   Orientation to time 1 3 3   Orientation to Place 3 5 5   Registration 3  3  Attention/ Calculation 5 5 4   Recall 0 0 1  Language- name 2 objects 2 2 2   Language- repeat 1  1  Language- follow 3 step command 3  3  Language- read & follow direction 1  1  Write a sentence 1  1  Copy design 0  1  Total score 20  25      03/18/2022    3:00 PM  Montreal Cognitive Assessment   Visuospatial/ Executive (0/5) 3  Naming (0/3) 3  Attention: Read list of digits (0/2) 2  Attention: Read list of letters (0/1) 1  Attention: Serial 7 subtraction starting at 100 (0/3) 3  Language: Repeat phrase (0/2) 1  Language : Fluency (0/1) 1  Abstraction (0/2) 1  Delayed Recall (0/5) 3  Orientation (0/6) 6  Total 24  Adjusted Score (based on education) 24      Immunizations Immunization History  Administered Date(s) Administered   Moderna Sars-Covid-2 Vaccination 04/05/2020, 05/03/2020, 11/24/2020, 06/07/2021    Screening Tests Health Maintenance  Topic Date Due   COVID-19 Vaccine (5 - 2025-26 season) 10/13/2024 (Originally 08/23/2024)   Zoster Vaccines- Shingrix (1 of 2) 12/28/2024 (Originally [birth year]28/1975)    DTaP/Tdap/Td (1 - Tdap) 02/09/2025 (Originally 06[birth year]28/1975)   Pneumococcal Vaccine: 50+ Years (1 of 2 - PCV) 02/09/2025 (Originally 06[birth year]28/1975)   Influenza Vaccine  03/22/2025 (Originally 07/23/2024)   Medicare Annual Wellness (AWV)  10/13/2025   Colonoscopy  08/25/2031   Hepatitis C Screening  Completed   Meningococcal B Vaccine  Aged Out    Health Maintenance Items Addressed: Due for vaccines  Additional Screening:  Vision Screening: Recommended annual ophthalmology exams for early detection of glaucoma and other disorders of the eye. Is the patient up to date with their annual eye exam?  No  Who is the provider or what is the name of the office in which the patient attends annual eye exams? none  Dental Screening: Recommended annual dental exams for proper oral hygiene  Community Resource Referral / Chronic Care Management: CRR required this visit?  Yes   CCM required this visit?  No   Plan:    I have personally reviewed and noted the following in the patient's chart:   Medical and social history Use of alcohol, tobacco  or illicit drugs  Current medications and supplements including opioid prescriptions. Patient is not currently taking opioid prescriptions. Functional ability and status Nutritional status Physical activity Advanced directives List of other physicians Hospitalizations, surgeries, and ER visits in previous 12 months Vitals Screenings to include cognitive, depression, and falls Referrals and appointments  In addition, I have reviewed and discussed with patient certain preventive protocols, quality metrics, and best practice recommendations. A written personalized care plan for preventive services as well as general preventive health recommendations were provided to patient.   Ardella FORBES Dawn, LPN   [id number]   After Visit Summary: (MyChart) Due to this being a telephonic visit, the after visit summary with patients personalized plan was offered to patient  via MyChart   Notes: Nothing significant to report at this time.

## 2024-10-13 NOTE — Patient Instructions (Signed)
 Connor Burns,  Thank you for taking the time for your Medicare Wellness Visit. I appreciate your continued commitment to your health goals. Please review the care plan we discussed, and feel free to reach out if I can assist you further.  Medicare recommends these wellness visits once per year to help you and your care team stay ahead of potential health issues. These visits are designed to focus on prevention, allowing your provider to concentrate on managing your acute and chronic conditions during your regular appointments.  Please note that Annual Wellness Visits do not include a physical exam. Some assessments may be limited, especially if the visit was conducted virtually. If needed, we may recommend a separate in-person follow-up with your provider.  Ongoing Care Seeing your primary care provider every 3 to 6 months helps us  monitor your health and provide consistent, personalized care.   Referrals If a referral was made during today's visit and you haven't received any updates within two weeks, please contact the referred provider directly to check on the status.  Recommended Screenings:  Health Maintenance  Topic Date Due   COVID-19 Vaccine (5 - 2025-26 season) 10/13/2024*   Zoster (Shingles) Vaccine (1 of 2) 12/28/2024*   DTaP/Tdap/Td vaccine (1 - Tdap) 02/09/2025*   Pneumococcal Vaccine for age over 4 (1 of 2 - PCV) 02/09/2025*   Flu Shot  03/22/2025*   Medicare Annual Wellness Visit  10/13/2025   Colon Cancer Screening  08/25/2031   Hepatitis C Screening  Completed   Meningitis B Vaccine  Aged Out  *Topic was postponed. The date shown is not the original due date.       10/13/2024    2:14 PM  Advanced Directives  Does Patient Have a Medical Advance Directive? No  Would patient like information on creating a medical advance directive? No - Patient declined   Advance Care Planning is important because it: Ensures you receive medical care that aligns with your values,  goals, and preferences. Provides guidance to your family and loved ones, reducing the emotional burden of decision-making during critical moments.  Vision: Annual vision screenings are recommended for early detection of glaucoma, cataracts, and diabetic retinopathy. These exams can also reveal signs of chronic conditions such as diabetes and high blood pressure.  Dental: Annual dental screenings help detect early signs of oral cancer, gum disease, and other conditions linked to overall health, including heart disease and diabetes.  Please see the attached documents for additional preventive care recommendations.

## 2024-10-15 ENCOUNTER — Telehealth: Payer: Self-pay

## 2024-10-15 NOTE — Progress Notes (Signed)
 Complex Care Management Note Care Guide Note  10/15/2024 Name: Connor Burns. MRN: [id number] DOB: 1955/12/26   Complex Care Management Outreach Attempts: An unsuccessful telephone outreach was attempted today to offer the patient information about available complex care management services.  Follow Up Plan:  Additional outreach attempts will be made to offer the patient complex care management information and services.   Encounter Outcome:  No Answer  .Debbe Fuse [facility], [facility] Guide  Direct Dial: [phone]  Fax [phone]

## 2024-10-17 ENCOUNTER — Other Ambulatory Visit: Payer: Self-pay | Admitting: Nurse Practitioner

## 2024-10-18 ENCOUNTER — Encounter (HOSPITAL_BASED_OUTPATIENT_CLINIC_OR_DEPARTMENT_OTHER): Admitting: General Surgery

## 2024-10-18 ENCOUNTER — Other Ambulatory Visit: Payer: Self-pay

## 2024-10-18 ENCOUNTER — Ambulatory Visit: Attending: Surgery | Admitting: Surgery

## 2024-10-18 DIAGNOSIS — L97819 Non-pressure chronic ulcer of other part of right lower leg with unspecified severity: Secondary | ICD-10-CM | POA: Diagnosis not present

## 2024-10-18 DIAGNOSIS — F03A0 Unspecified dementia, mild, without behavioral disturbance, psychotic disturbance, mood disturbance, and anxiety: Secondary | ICD-10-CM

## 2024-10-18 MED ORDER — DONEPEZIL HCL 10 MG PO TABS: 10.0000 mg | ORAL_TABLET | Freq: Every day | ORAL | 3 refills | Status: AC

## 2024-10-18 MED ORDER — RISPERIDONE 0.5 MG PO TABS: 0.5000 mg | ORAL_TABLET | Freq: Every day | ORAL | 0 refills | Status: DC

## 2024-10-18 MED ORDER — CARVEDILOL 3.125 MG PO TABS: 3.1250 mg | ORAL_TABLET | Freq: Two times a day (BID) | ORAL | 2 refills | Status: AC

## 2024-10-18 MED ORDER — ISOSORBIDE MONONITRATE ER 30 MG PO TB24: 30.0000 mg | ORAL_TABLET | Freq: Every day | ORAL | 0 refills | Status: AC

## 2024-10-18 NOTE — Progress Notes (Deleted)
 Vascular and Vein Specialist of   Patient name: Connor Burns. MRN: [id number] DOB: 22-May-1956 Sex: male   REASON FOR VISIT:    Follow-up  HISOTRY OF PRESENT ILLNESS:    Steffen Hase. is a 68 y.o. male who I met in August 2025 for evaluation of bilateral lower extremity wounds.  I felt the wounds on his calf were related to volume overload and had recommended continued elevation and compression.  There was a small ulcer on his left heel which was very superficial.  For this I recommended offloading.  He had ABIs in the 0.66 range.  My plan was to monitor him for a month to see if the wounds improved and if not proceed with angiography.   Patient has a history of coronary artery disease, status post MI and CABG. He is on Eliquis  for atrial fibrillation and history of DVT. He takes a statin for hypercholesterolemia. He is medically managed for hypertension he is a non-smoker and is diabetic. 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  He is to restart his Xarelto  on Sunday night.   CAD (coronary artery disease)    CHF (congestive heart failure) 01/14/2017   Intermittent CHF with lower extremity edema.  I have given him Lasix  to take on a p.r.n. basis.Formatting of this note might be different from the original. Last Assessment & Plan: Formatting of this note might be different from the original. Intermittent CHF with lower extremity edema.  I have given him  Las   Chronic kidney disease 10/27/2020   Stage II   Congestive cardiomyopathy 01/14/2017   Cardiomyopathy with LV dysfunction.Last ejection fraction 40-45% by echocardiography in 2012.Patient currently not taking beta-blocker Ace inhibitor for some reason.We will repeat echocardiography to evaluate LV function then determine the need for afterload reduction and beta-blocker therapy.[place]   Coronary atherosclerosis 01/14/2017   Status remote CABG in 2004 in [place] .Left internal mammary artery to the obtuse marginal.Free Right internal mammary artery to the LAD.Saphenous vein graft to OM 2.Saphenous vein graft to the RCA.  Asymptomatic without chest pain or shortness of breath.Last nuclear stress test May 2018:  Large   DVT (deep venous thrombosis)    Dyslipidemia 05/07/2018   Hematuria 08/10/2015   Hypertension    ICD (implantable cardioverter-defibrillator) battery depletion 06/16/2018   Status post dual-chamber ICD generator change 06/16/2018.Last Assessment & Plan: Formatting of this note might be different from the original.Left infraclavicular incision intact with Dermabond.  There is no pocket hematoma present.  No drainage noted.Device interrogation this a.m. reveals appropriate device function.   Ischemic cardiomyopathy 06/18/2016   Echo 09/14/2011 showing LA 4.9cm, EF 40-45%.  Mild to moderate decreased LV systolic function.  Mild concentric LVH.  LA is moderately dilated.  Mild aortic cusp sclerosis. Aortic valve appears to be bicuspid.B. Concerning for potential dysrhythmia (ventricular tachycardia) especially given documented nonsustained ventricular tachy   Mild dementia, unclear etiology 12/30/2022   Myocardial infarction    Obesity (BMI 30-39.9) 08/10/2015   Onychomycosis 05/10/2014  Painful legs and moving toes of left foot 05/10/2014   Paroxysmal atrial fibrillation 08/08/2015   A. Paroxysmal.B. On Sotalol therapy.Last Assessment & Plan: History of present atrial  fibrillation.  Patient will 80 mg twice daily.Also on rivaroxaban  20 mg a day.Patient again refused an ECG.I explained to the patient, that the absence of an EKG, I canno   Peripheral vascular disease    Shortness of breath 10/27/2020   Exertional   Stroke 10/27/2020   Right frontal infarct per neuroimaging dating back at least to 2013   Syncope 01/14/2017   In the setting of ischemic cardiomyopathy. B. Concerning for potential dysrhythmia (ventricular tachycardia) especially given documented nonsustained ventricular tachycardia - EPS 9/25/201 showing reproducibly inducible nonsustained monomorphic VT up to 10 beat.  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 In the setting of ischemic cardiomyopathy. B. Concerning for potential dysrhythmia (ventricular tachycardia) especially given documented nonsustained ventricular tachycardia - EPS 9/25/201 showing reproducibly inducible nonsustained monomorphic VT up to 10 beat.  Reproducibly inducible nonsustained polymorphic VT.  Inducible VF wi     FAMILY HISTORY:   Family History  Problem Relation Age of Onset   Heart disease Mother        before age 61   Hyperlipidemia Mother    Hypertension Mother    Heart attack Mother    Hyperlipidemia Father    Hypertension Father    Memory loss Brother    Memory loss Brother    Memory loss Half-Sister    Memory loss Maternal Aunt     SOCIAL HISTORY:   Social History   Tobacco Use   Smoking status: Never   Smokeless tobacco: Never  Substance Use Topics   Alcohol use: No     ALLERGIES:   Allergies  Allergen Reactions   Clopidogrel Anaphylaxis, Swelling, Other (See Comments) and Cough    Out of body experience      CURRENT MEDICATIONS:   Current Outpatient Medications  Medication Sig Dispense Refill   apixaban  (ELIQUIS ) 5 MG TABS tablet Take 1  tablet (5 mg total) by mouth 2 (two) times daily. 60 tablet 3   atorvastatin  (LIPITOR) 20 MG tablet Take 1 tablet (20 mg total) by mouth daily. (Patient not taking: Reported on 10/13/2024) 30 tablet 2   carvedilol  (COREG ) 3.125 MG tablet Take 1 tablet (3.125 mg total) by mouth 2 (two) times daily. (Patient not taking: Reported on 10/13/2024) 60 tablet 2   ciclopirox  (PENLAC ) 8 % solution Apply topically at bedtime. Apply over nail and surrounding skin. Apply daily over previous coat. After seven (7) days, may remove with alcohol and continue cycle. (Patient not taking: Reported on 10/13/2024) 6.6 mL 12   cyanocobalamin  (VITAMIN B12) 1000 MCG tablet Take 1 tablet (1,000 mcg total) by mouth daily. (Patient not taking: Reported on 10/13/2024) 30 tablet 2   donepezil  (ARICEPT ) 10 MG tablet Take 1 tablet (10 mg total) by mouth daily. 90 tablet 3   empagliflozin  (JARDIANCE ) 10 MG TABS tablet Take 1 tablet (10 mg total) by mouth daily. 90 tablet 3   [Paused] Furosemide  (FUROSCIX ) 80 MG/10ML CTKT Inject 80 mg into the skin daily as needed. (Patient not taking: Reported on 10/13/2024) 5 each 2   isosorbide  mononitrate (IMDUR ) 30 MG 24 hr tablet Take 1 tablet by mouth once daily 30 tablet 0   potassium chloride  SA (KLOR-CON  M) 20  MEQ tablet Take 2 tablets (40 mEq total) by mouth 2 (two) times daily. 120 tablet 0   risperiDONE  (RISPERDAL ) 0.5 MG tablet Take 1 tablet (0.5 mg total) by mouth at bedtime. (Patient not taking: Reported on 10/13/2024) 30 tablet 0   silver  sulfADIAZINE  (SILVADENE ) 1 % cream Apply 1 Application topically 2 (two) times daily. Clean site of wound and apply cream. (Patient not taking: Reported on 10/13/2024) 400 g 1   SV IRON 325 (65 Fe) MG tablet Take 325 mg by mouth 3 (three) times a week. (Patient not taking: Reported on 10/13/2024)     torsemide  (DEMADEX ) 20 MG tablet Take 4 tablets (80 mg total) by mouth 2 (two) times daily. 240 tablet 5   No current facility-administered medications  for this visit.    REVIEW OF SYSTEMS:   [X]  denotes positive finding, [ ]  denotes negative finding Cardiac  Comments:  Chest pain or chest pressure: ***   Shortness of breath upon exertion:    Short of breath when lying flat:    Irregular heart rhythm:        Vascular    Pain in calf, thigh, or hip brought on by ambulation:    Pain in feet at night that wakes you up from your sleep:     Blood clot in your veins:    Leg swelling:         Pulmonary    Oxygen  at home:    Productive cough:     Wheezing:         Neurologic    Sudden weakness in arms or legs:     Sudden numbness in arms or legs:     Sudden onset of difficulty speaking or slurred speech:    Temporary loss of vision in one eye:     Problems with dizziness:         Gastrointestinal    Blood in stool:     Vomited blood:         Genitourinary    Burning when urinating:     Blood in urine:        Psychiatric    Major depression:         Hematologic    Bleeding problems:    Problems with blood clotting too easily:        Skin    Rashes or ulcers:        Constitutional    Fever or chills:      PHYSICAL EXAM:   There were no vitals filed for this visit.  GENERAL: The patient is a well-nourished male, in no acute distress. The vital signs are documented above. CARDIAC: There is a regular rate and rhythm.  VASCULAR: *** PULMONARY: Non-labored respirations ABDOMEN: Soft and non-tender with normal pitched bowel sounds.  MUSCULOSKELETAL: There are no major deformities or cyanosis. NEUROLOGIC: No focal weakness or paresthesias are detected. SKIN: There are no ulcers or rashes noted. PSYCHIATRIC: The patient has a normal affect.  STUDIES:   ***  MEDICAL ISSUES:   ***    Malvina Serene CLORE, MD, FACS [facility] [phone] Pager [phone]

## 2024-10-19 NOTE — Progress Notes (Signed)
 Remote ICD transmission.

## 2024-10-19 NOTE — Addendum Note (Signed)
 Addended by: VICCI SELLER A on: 10/19/2024 04:09 PM   Modules accepted: Orders

## 2024-10-20 NOTE — Progress Notes (Signed)
 Complex Care Management Note Care Guide Note  10/20/2024 Name: Connor Burns. MRN: [id number] DOB: 1956/02/12   Complex Care Management Outreach Attempts: A second unsuccessful outreach was attempted today to offer the patient with information about available complex care management services.  Follow Up Plan:  Additional outreach attempts will be made to offer the patient complex care management information and services.   Encounter Outcome:  No Answer  .Debbe Fuse [facility], [facility] Guide  Direct Dial: [phone]  Fax [phone]

## 2024-10-22 ENCOUNTER — Other Ambulatory Visit: Payer: Self-pay | Admitting: Cardiology

## 2024-10-22 DIAGNOSIS — D6869 Other thrombophilia: Secondary | ICD-10-CM

## 2024-10-22 NOTE — Telephone Encounter (Signed)
 Prescription refill request for Eliquis  received. Indication:afib Last office visit:2/25 Scr:2.33  10/25 Age: 68 Weight:129.6  kg  Prescription refilled

## 2024-10-25 ENCOUNTER — Telehealth: Payer: Self-pay

## 2024-10-25 NOTE — Progress Notes (Signed)
 Complex Care Management Note  Care Guide Note 10/25/2024 Name: Connor Burns. MRN: [id number] DOB: August 15, 1956  Connor Burns. is a 68 y.o. year old male who sees Georgina Speaks, FNP for primary care. I reached out to Alecxander Weisberg Jr. by phone today to offer complex care management services.  [name] was given information about Complex Care Management services today including:   The Complex Care Management services include support from the care team which includes your Nurse Care Manager, Clinical Social Worker, or Pharmacist.  The Complex Care Management team is here to help remove barriers to the health concerns and goals most important to you. Complex Care Management services are voluntary, and the patient may decline or stop services at any time by request to their care team member.   Complex Care Management Consent Status: Patient agreed to services and verbal consent obtained.   Follow up plan:  Telephone appointment with complex care management team member scheduled for:  11/23/2024  Encounter Outcome:  Patient Scheduled  .Debbe Fuse [facility], [facility] Guide  Direct Dial: [phone]  Fax [phone]

## 2024-10-25 NOTE — Progress Notes (Signed)
 Complex Care Management Note Care Guide Note  10/25/2024 Name: Connor Burns. MRN: [id number] DOB: 01/27/1956   Complex Care Management Outreach Attempts: A third unsuccessful outreach was attempted today to offer the patient with information about available complex care management services.  Follow Up Plan:  No further outreach attempts will be made at this time. We have been unable to contact the patient to offer or enroll patient in complex care management services.  Encounter Outcome:  No Answer  .Debbe Fuse [facility], [facility] Guide  Direct Dial: [phone]  Fax [phone]

## 2024-10-27 ENCOUNTER — Encounter

## 2024-10-27 ENCOUNTER — Encounter (HOSPITAL_BASED_OUTPATIENT_CLINIC_OR_DEPARTMENT_OTHER): Admitting: General Surgery

## 2024-11-01 ENCOUNTER — Ambulatory Visit (HOSPITAL_COMMUNITY)

## 2024-11-02 ENCOUNTER — Ambulatory Visit (INDEPENDENT_AMBULATORY_CARE_PROVIDER_SITE_OTHER): Payer: Self-pay | Admitting: Podiatry

## 2024-11-02 ENCOUNTER — Telehealth (HOSPITAL_COMMUNITY): Payer: Self-pay | Admitting: Family Medicine

## 2024-11-02 DIAGNOSIS — Z91199 Patient's noncompliance with other medical treatment and regimen due to unspecified reason: Secondary | ICD-10-CM

## 2024-11-03 ENCOUNTER — Ambulatory Visit: Payer: Self-pay

## 2024-11-04 NOTE — Progress Notes (Signed)
Patient did not show for scheduled appointment today.

## 2024-11-08 ENCOUNTER — Other Ambulatory Visit: Payer: Self-pay | Admitting: Nurse Practitioner

## 2024-11-08 DIAGNOSIS — F03A0 Unspecified dementia, mild, without behavioral disturbance, psychotic disturbance, mood disturbance, and anxiety: Secondary | ICD-10-CM

## 2024-11-08 DIAGNOSIS — I5022 Chronic systolic (congestive) heart failure: Secondary | ICD-10-CM

## 2024-11-08 DIAGNOSIS — I110 Hypertensive heart disease with heart failure: Secondary | ICD-10-CM

## 2024-11-08 DIAGNOSIS — I4821 Permanent atrial fibrillation: Secondary | ICD-10-CM

## 2024-11-08 DIAGNOSIS — Z95810 Presence of automatic (implantable) cardiac defibrillator: Secondary | ICD-10-CM

## 2024-11-10 LAB — LAB REPORT - SCANNED
EGFR: 31
PTH, Intact: 34

## 2024-11-12 ENCOUNTER — Telehealth (HOSPITAL_COMMUNITY): Payer: Self-pay

## 2024-11-12 NOTE — Telephone Encounter (Signed)
 Called to confirm/remind patient of their appointment at the [facility] on 11/15/2024.   Appointment:   [x] Confirmed  [] Left mess   [] No answer/No voice mail  [] VM Full/unable to leave message  [] Phone not in service  Patient reminded to bring all medications and/or complete list.  Confirmed patient has transportation. Gave directions, instructed to utilize valet parking.

## 2024-11-15 ENCOUNTER — Ambulatory Visit

## 2024-11-15 ENCOUNTER — Encounter (HOSPITAL_COMMUNITY): Payer: Self-pay

## 2024-11-15 ENCOUNTER — Ambulatory Visit (HOSPITAL_COMMUNITY)
Admission: RE | Admit: 2024-11-15 | Discharge: 2024-11-15 | Disposition: A | Discharge status: Home / Self Care | Source: Ambulatory Visit | Attending: Internal Medicine | Admitting: Internal Medicine

## 2024-11-15 VITALS — BP 116/64 | HR 65 | Ht 74.0 in | Wt 296.0 lb

## 2024-11-15 DIAGNOSIS — Z7984 Long term (current) use of oral hypoglycemic drugs: Secondary | ICD-10-CM | POA: Diagnosis not present

## 2024-11-15 DIAGNOSIS — I252 Old myocardial infarction: Secondary | ICD-10-CM | POA: Insufficient documentation

## 2024-11-15 DIAGNOSIS — Z951 Presence of aortocoronary bypass graft: Secondary | ICD-10-CM | POA: Diagnosis not present

## 2024-11-15 DIAGNOSIS — I5022 Chronic systolic (congestive) heart failure: Secondary | ICD-10-CM

## 2024-11-15 DIAGNOSIS — I472 Ventricular tachycardia, unspecified: Secondary | ICD-10-CM | POA: Insufficient documentation

## 2024-11-15 DIAGNOSIS — I739 Peripheral vascular disease, unspecified: Secondary | ICD-10-CM | POA: Insufficient documentation

## 2024-11-15 DIAGNOSIS — Z7901 Long term (current) use of anticoagulants: Secondary | ICD-10-CM | POA: Insufficient documentation

## 2024-11-15 DIAGNOSIS — F0390 Unspecified dementia without behavioral disturbance: Secondary | ICD-10-CM | POA: Insufficient documentation

## 2024-11-15 DIAGNOSIS — E669 Obesity, unspecified: Secondary | ICD-10-CM | POA: Insufficient documentation

## 2024-11-15 DIAGNOSIS — I2510 Atherosclerotic heart disease of native coronary artery without angina pectoris: Secondary | ICD-10-CM | POA: Insufficient documentation

## 2024-11-15 DIAGNOSIS — Z79899 Other long term (current) drug therapy: Secondary | ICD-10-CM | POA: Insufficient documentation

## 2024-11-15 DIAGNOSIS — Z86718 Personal history of other venous thrombosis and embolism: Secondary | ICD-10-CM | POA: Diagnosis not present

## 2024-11-15 DIAGNOSIS — I255 Ischemic cardiomyopathy: Secondary | ICD-10-CM | POA: Insufficient documentation

## 2024-11-15 DIAGNOSIS — Z95810 Presence of automatic (implantable) cardiac defibrillator: Secondary | ICD-10-CM | POA: Diagnosis present

## 2024-11-15 DIAGNOSIS — Z139 Encounter for screening, unspecified: Secondary | ICD-10-CM

## 2024-11-15 DIAGNOSIS — I4811 Longstanding persistent atrial fibrillation: Secondary | ICD-10-CM | POA: Insufficient documentation

## 2024-11-15 DIAGNOSIS — N184 Chronic kidney disease, stage 4 (severe): Secondary | ICD-10-CM | POA: Diagnosis not present

## 2024-11-15 DIAGNOSIS — I4819 Other persistent atrial fibrillation: Secondary | ICD-10-CM

## 2024-11-15 LAB — CUP PACEART REMOTE DEVICE CHECK
Battery Remaining Longevity: 20 mo
Battery Voltage: 2.88 V
Brady Statistic AP VP Percent: 1.08 %
Brady Statistic AP VS Percent: 4.86 %
Brady Statistic AS VP Percent: 1.62 %
Brady Statistic AS VS Percent: 92.44 %
Brady Statistic RA Percent Paced: 4.19 %
Brady Statistic RV Percent Paced: 2.22 %
Date Time Interrogation Session: 20251124162214
HighPow Impedance: 48 Ohm
Implantable Lead Connection Status: 753985
Implantable Lead Connection Status: 753985
Implantable Lead Implant Date: 20120926
Implantable Lead Implant Date: 20120926
Implantable Lead Location: 753859
Implantable Lead Location: 753860
Implantable Lead Model: 5076
Implantable Pulse Generator Implant Date: 20190625
Lead Channel Impedance Value: 1311 Ohm
Lead Channel Impedance Value: 399 Ohm
Lead Channel Impedance Value: 513 Ohm
Lead Channel Pacing Threshold Amplitude: 1 V
Lead Channel Pacing Threshold Amplitude: 1.375 V
Lead Channel Pacing Threshold Pulse Width: 0.4 ms
Lead Channel Pacing Threshold Pulse Width: 0.4 ms
Lead Channel Sensing Intrinsic Amplitude: 0.5 mV
Lead Channel Sensing Intrinsic Amplitude: 0.5 mV
Lead Channel Sensing Intrinsic Amplitude: 4.5 mV
Lead Channel Sensing Intrinsic Amplitude: 4.5 mV
Lead Channel Setting Pacing Amplitude: 0.5 V
Lead Channel Setting Pacing Amplitude: 2 V
Lead Channel Setting Pacing Pulse Width: 0.03 ms
Lead Channel Setting Sensing Sensitivity: 0.3 mV
Zone Setting Status: 755011
Zone Setting Status: 755011

## 2024-11-15 NOTE — Patient Instructions (Signed)
 Medication Changes:  None, continue current medications  Referrals:  You have been referred to follow-up with Dr Inocencio, his office will call you for an appointment, or you can call them at [phone]  Special Instructions // Education:  Do the following things EVERYDAY: Weigh yourself in the morning before breakfast. Write it down and keep it in a log. Take your medicines as prescribed Eat low salt foods--Limit salt (sodium) to 2000 mg per day.  Stay as active as you can everyday Limit all fluids for the day to less than 2 liters   Follow-Up in: 2 months   At the [facility], you and your health needs are our priority. We have a designated team specialized in the treatment of Heart Failure. This Care Team includes your primary Heart Failure Specialized Cardiologist (physician), Advanced Practice Providers (APPs- Physician Assistants and Nurse Practitioners), and Pharmacist who all work together to provide you with the care you need, when you need it.   You may see any of the following providers on your designated Care Team at your next follow up:  Dr. Toribio Fuel Dr. Ezra Shuck Dr. Odis Brownie Greig Mosses, NP Caffie Shed, [place] [facility] [place], [place] Beckey Coe, NP Jordan Lee, NP Tinnie Redman, PharmD   Please be sure to bring in all your medications bottles to every appointment.   Need to Contact Us :  If you have any questions or concerns before your next appointment please send us  a message through [employer] or call our office at [phone].    TO LEAVE A MESSAGE FOR THE NURSE SELECT OPTION 2, PLEASE LEAVE A MESSAGE INCLUDING: YOUR NAME DATE OF BIRTH CALL BACK NUMBER REASON FOR CALL**this is important as we prioritize the call backs  YOU WILL RECEIVE A CALL BACK THE SAME DAY AS LONG AS YOU CALL BEFORE 4:00 PM

## 2024-11-15 NOTE — Progress Notes (Addendum)
 Advanced Heart Failure Clinic Note   Primary Care: Georgina Speaks, FNP Primary Cardiologist: Dr. Sheena Nephrology: Dr. Tobie ([facility])  HPI: 68 y.o. male with history of CAD s/p MI/CABG in 2004 in [place], HFrEF/ICM s/p ICD, longstanding persistent atrial fibrillation, hx VT, CKD, hx DVT, obesity, dementia, PAD. He previously slaw Dr. Okey in 2018, then followed with Cardiology in [place], [place]. Most recently established with Dr. Sheena in 03/24 after moving back to the area.   Had cath in 2015. Report not available. Available records suggest 2 of his vein grafts were occluded.  Echo 2019: EF 30-35%  Echo at OSH 03/24: EF 30-35%, RV function not assessed  He had a stress test in 3/24 with large area of infarction in inferior wall without reversible ischemia, LVEF 23%. Study was reviewed by Dr. Sheena. Decision made to defer LHC d/t kidney function.   He was admitted 9/24 with acute respiratory failure with hypoxia 2/2 acute on chronic CHF. Had not been compliant with medications. Echo during admit with EF 35-40%, RWMA, RV mildly reduced. Cardiology consulted. He was diuresed. GDMT limited by CKD, blood pressure too soft for BiDil. Weight on admit 294 lb >>> 225 lb at discharge.   He was seen in TOC x 2 post hospital and was instructed to continue ongoing care w/ general cardiology w/ f/u in [facility] as needed.   Follow up 2/25 with [facility] and was significantly volume overloaded, wt was up ~50 lb, in the setting of dietary indiscretion and nonadherance to home diuretic regimen. Furoscix  was considered but pt declined citing fear of needles. Torsemide  was increased and he was instructed to follow back up in the [facility].   Admitted 6/25 to OOH in [place] , with AMS. Got in his car and drove from [place] to [place]. Found by police at a gas station, and taken to hospital in [place]. GDMT stopped, given IVF. Found to have elevated LFTs, felt to be hepatic congestion, unable to tolerate MRCP. Ltd echo showed EF 41-45%.  AMS felt to be 2/2 to progressive dementia and he was started on risperidone . Cards consulted due to HF and diuresed with IV lasix , and GDMT restarted.  Admitted 9/25 with A/C HFrEF in the setting of medication non adherence. Echo showed EF 30-35%. GDMT limited by CKD. Diuresed with IV lasix . Palliative care team was consulted, family declined SNF but agreed to [facility] services.   Today he returns for AHF follow up with his wife. Overall feeling pretty good. Denies palpitations, CP, dizziness, or PND/Orthopnea. Edema at baseline, his wife helps keep his legs wrapped. SOB with activity. Appetite ok. No fever or chills. Does not weight at home as he does not have a scale. Taking all medications, his wife helps him with his medicines. Denies ETOH, tobacco or drug use. Most of the history was provided by his wife with hx dementia. Woks with PT once a week.    Past Medical History:  Diagnosis Date   Acute osteomyelitis of ankle and foot 05/17/2014   Acute systolic heart failure (HCC) 09/12/2023   Arthritis 10/27/2020   Wrist   Atherosclerosis of native arteries of the extremities with ulceration 06/15/2014   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   veral times a day for 10 minutes each time; The patient was also instructed to hold his Xarelto  tonight and tomorrow night.  He is to restart his Xarelto  on Sunday night.   CAD (coronary artery disease)    CHF (congestive heart failure) 01/14/2017  Intermittent CHF with lower extremity edema.  I have given him Lasix  to take on a p.r.n. basis.Formatting of this note might be different from the original. Last Assessment & Plan: Formatting of this note might be different from the original. Intermittent CHF with lower extremity edema.  I have given him Las   Chronic kidney disease 10/27/2020   Stage II   Congestive cardiomyopathy 01/14/2017   Cardiomyopathy with LV dysfunction.Last ejection fraction 40-45% by echocardiography in  2012.Patient currently not taking beta-blocker Ace inhibitor for some reason.We will repeat echocardiography to evaluate LV function then determine the need for afterload reduction and beta-blocker therapy.[place]   Coronary atherosclerosis 01/14/2017   Status remote CABG in 2004 in [place] .Left internal mammary artery to the obtuse marginal.Free Right internal mammary artery to the LAD.Saphenous vein graft to OM 2.Saphenous vein graft to the RCA.  Asymptomatic without chest pain or shortness of breath.Last nuclear stress test May 2018:  Large   DVT (deep venous thrombosis)    Dyslipidemia 05/07/2018   Hematuria 08/10/2015   Hypertension    ICD (implantable cardioverter-defibrillator) battery depletion 06/16/2018   Status post dual-chamber ICD generator change 06/16/2018.Last Assessment & Plan: Formatting of this note might be different from the original.Left infraclavicular incision intact with Dermabond.  There is no pocket hematoma present.  No drainage noted.Device interrogation this a.m. reveals appropriate device function.   Ischemic cardiomyopathy 06/18/2016   Echo 09/14/2011 showing LA 4.9cm, EF 40-45%.  Mild to moderate decreased LV systolic function.  Mild concentric LVH.  LA is moderately dilated.  Mild aortic cusp sclerosis. Aortic valve appears to be bicuspid.B. Concerning for potential dysrhythmia (ventricular tachycardia) especially given documented nonsustained ventricular tachy   Mild dementia, unclear etiology 12/30/2022   Myocardial infarction    Obesity (BMI 30-39.9) 08/10/2015   Onychomycosis 05/10/2014   Painful legs and moving toes of left foot 05/10/2014   Paroxysmal atrial fibrillation 08/08/2015   A. Paroxysmal.B. On Sotalol therapy.Last Assessment & Plan: History of present atrial fibrillation.  Patient will 80 mg twice daily.Also on rivaroxaban  20 mg a day.Patient again refused an ECG.I explained to the patient, that the absence of an EKG, I canno   Peripheral  vascular disease    Shortness of breath 10/27/2020   Exertional   Stroke 10/27/2020   Right frontal infarct per neuroimaging dating back at least to 2013   Syncope 01/14/2017   In the setting of ischemic cardiomyopathy. B. Concerning for potential dysrhythmia (ventricular tachycardia) especially given documented nonsustained ventricular tachycardia - EPS 9/25/201 showing reproducibly inducible nonsustained monomorphic VT up to 10 beat.  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 In the setting of ischemic cardiomyopathy. B. Concerning for potential dysrhythmia (ventricular tachycardia) especially given documented nonsustained ventricular tachycardia - EPS 9/25/201 showing reproducibly inducible nonsustained monomorphic VT up to 10 beat.  Reproducibly inducible nonsustained polymorphic VT.  Inducible VF wi   Current Outpatient Medications  Medication Sig Dispense Refill   apixaban  (ELIQUIS ) 5 MG TABS tablet Take 1 tablet by mouth twice daily 60 tablet 5   atorvastatin  (LIPITOR) 20 MG tablet Take 1 tablet (20 mg total) by mouth daily. 30 tablet 2   carvedilol  (COREG ) 3.125 MG tablet Take 1 tablet (3.125 mg total) by mouth 2 (two) times daily. 60 tablet 2   cyanocobalamin  (VITAMIN B12) 1000 MCG tablet Take 1 tablet (1,000 mcg total) by mouth  daily. 30 tablet 2   donepezil  (ARICEPT ) 10 MG tablet Take 1 tablet (10 mg total) by mouth daily. 90 tablet 3   empagliflozin  (JARDIANCE ) 10 MG TABS tablet Take 1 tablet (10 mg total) by mouth daily. 90 tablet 3   isosorbide  mononitrate (IMDUR ) 30 MG 24 hr tablet Take 1 tablet (30 mg total) by mouth daily. 30 tablet 0   potassium chloride  SA (KLOR-CON  M) 20 MEQ tablet Take 2 tablets (40 mEq total) by mouth 2 (two) times daily. 120 tablet 0   risperiDONE  (RISPERDAL ) 0.5 MG tablet Take 1 tablet (0.5 mg total) by mouth at  bedtime. 30 tablet 0   SV IRON 325 (65 Fe) MG tablet Take 325 mg by mouth 3 (three) times a week.     torsemide  (DEMADEX ) 20 MG tablet Take 4 tablets (80 mg total) by mouth 2 (two) times daily. 240 tablet 5   ciclopirox  (PENLAC ) 8 % solution Apply topically at bedtime. Apply over nail and surrounding skin. Apply daily over previous coat. After seven (7) days, may remove with alcohol and continue cycle. (Patient not taking: Reported on 11/15/2024) 6.6 mL 12   [Paused] Furosemide  (FUROSCIX ) 80 MG/10ML CTKT Inject 80 mg into the skin daily as needed. (Patient not taking: Reported on 11/15/2024) 5 each 2   silver  sulfADIAZINE  (SILVADENE ) 1 % cream Apply 1 Application topically 2 (two) times daily. Clean site of wound and apply cream. (Patient not taking: Reported on 11/15/2024) 400 g 1   No current facility-administered medications for this encounter.   Allergies  Allergen Reactions   Clopidogrel Anaphylaxis, Swelling, Other (See Comments) and Cough    Out of body experience    Social History   Socioeconomic History   Marital status: Married    Spouse name: [name]   Number of children: 1   Years of education: 63   Highest education level: Master's degree (e.g., MA, MS, MEng, MEd, MSW, MBA)  Occupational History   Occupation: Chaplain    Comment: [employer]    Comment: newly retired  Tobacco Use   Smoking status: Never   Smokeless tobacco: Never  Vaping Use   Vaping status: Never Used  Substance and Sexual Activity   Alcohol use: No   Drug use: No   Sexual activity: Not on file  Other Topics Concern   Not on file  Social History Narrative   Lives with wife in a 2 story home.  Has 3 children.     Works as a metallurgist.     Education: Masters in [employer].   Right handed   Caffeine yes   Social Drivers of Health   Financial Resource Strain: Medium Risk (10/13/2024)   Overall Financial Resource Strain (CARDIA)    Difficulty of Paying Living Expenses: Somewhat hard  Food  Insecurity: Food Insecurity Present (10/13/2024)   Hunger Vital Sign    Worried About Running Out of Food in the Last Year: Sometimes true    Ran Out of Food in the Last Year: Sometimes true  Transportation Needs: No Transportation Needs (10/13/2024)   PRAPARE - Administrator, Civil Service (Medical): No    Lack of Transportation (Non-Medical): No  Physical Activity: Inactive (10/13/2024)   Exercise Vital Sign    Days of Exercise per Week: 0 days    Minutes of Exercise per Session: 0 min  Stress: Stress Concern Present (10/13/2024)   [employer] of Occupational Health - Occupational Stress Questionnaire    Feeling of  Stress: To some extent  Social Connections: Socially Integrated (10/13/2024)   Social Connection and Isolation Panel    Frequency of Communication with Friends and Family: More than three times a week    Frequency of Social Gatherings with Friends and Family: More than three times a week    Attends Religious Services: More than 4 times per year    Active Member of [employer] or Organizations: Yes    Attends Engineer, Structural: More than 4 times per year    Marital Status: Married  Catering Manager Violence: Not At Risk (10/13/2024)   Humiliation, Afraid, Rape, and Kick questionnaire    Fear of Current or Ex-Partner: No    Emotionally Abused: No    Physically Abused: No    Sexually Abused: No    Family History  Problem Relation Age of Onset   Heart disease Mother        before age 17   Hyperlipidemia Mother    Hypertension Mother    Heart attack Mother    Hyperlipidemia Father    Hypertension Father    Memory loss Brother    Memory loss Brother    Memory loss Half-Sister    Memory loss Maternal Aunt    Wt Readings from Last 3 Encounters:  11/15/24 134.3 kg (296 lb)  09/27/24 129.6 kg (285 lb 12.8 oz)  09/01/24 125.6 kg (276 lb 14.4 oz)   BP 116/64   Pulse 65   Ht 6' 2 (1.88 m)   Wt 134.3 kg (296 lb)   SpO2 97%   BMI 38.00  kg/m   PHYSICAL EXAM: General:  chronically ill appearing.  No respiratory difficulty Neck: JVD ~8/9 cm.  Cor: Irregular rate & rhythm. No murmurs. Lungs: clear, diminished bases Extremities: +1-2 BLE edema  Neuro: alert & oriented x 3. Affect pleasant.   Device interrogation (personally reviewed): OptiVol down, 100% AF, no VT, 0.1 hr/day activity, VP 2.2%   ASSESSMENT & PLAN: Chronic Systolic Heart Failure - iCM - Hx MI s/p CABG in 2004 - s/p ICD - EF had been in the 30-35% x years - Echo 9/24 : EF 35-40%, RV mildly reduced - Echo 6/25: EF 40-45% - Lim Echo 9/25 EF 30-35% - NYHA IIIb, volume stable on exam and on Optivol.  - Continue torsemide  80 mg bid + 40 KCL bid.  - Continue Jardiance  10 mg daily.  - Continue Coreg  3.125 mg bid. - Labs reviewed from last week in [facility].   2. CAD - Hx MI and CABG (LIMA to OM, Free RIMA to LAD, SVG to OM2, SVG to RCA) in 2003. - Records report 2 vein grafts occluded on cath in 2015. - No chest pain. - No ASA with anticoagulation. - Continue statin.  3. Persistent atrial fibrillation - hx Afib ablation - Pursuing rate control - Continue Coreg . - Continue Eliquis  5 mg bid. Denies abnormal bleeding.  - Followed by EP.  4. CKD IV - Baseline SCr 2.3-2.5. - Continue Jardiance . - Labs today - Followed by [facility].   5. Dementia - Follows with Neurology.   - Wife is assisting with medications  6. VT - Hx ICD placement. Need f/u with EP as they keep getting messages about remote transmission not being recorded but they are unsure on how to send as they have no device at home?.  - Established with EP, will send message to their office today for f/u.   7. PAD - Previously followed by Dr Eliza with  [facility], has hx of osteomyelitis - Denies claudication but generally not active - Chronic LE wound, followed by Wound Clinic - ABIs showed moderate PVD in bilateral legs - Continue statin + AC  8. SDOH - Wife works & manges his meds,  not much home support - Nnot Paramedicine candidate due to location. He lives in [place], verified again today.  - ? [facility]  Follow up in 2 months with APP. High risk of readmission with difficulty managing meds. Wife helps as much as she can.   Beckey LITTIE Coe, AGACNP-BC  11/15/24

## 2024-11-17 ENCOUNTER — Ambulatory Visit: Payer: Self-pay | Admitting: Cardiology

## 2024-11-17 NOTE — Progress Notes (Signed)
 Remote ICD Transmission

## 2024-11-22 ENCOUNTER — Encounter (HOSPITAL_BASED_OUTPATIENT_CLINIC_OR_DEPARTMENT_OTHER): Attending: General Surgery | Admitting: General Surgery

## 2024-11-22 DIAGNOSIS — L97429 Non-pressure chronic ulcer of left heel and midfoot with unspecified severity: Secondary | ICD-10-CM | POA: Insufficient documentation

## 2024-11-22 DIAGNOSIS — F03A0 Unspecified dementia, mild, without behavioral disturbance, psychotic disturbance, mood disturbance, and anxiety: Secondary | ICD-10-CM | POA: Insufficient documentation

## 2024-11-22 DIAGNOSIS — N1832 Chronic kidney disease, stage 3b: Secondary | ICD-10-CM | POA: Diagnosis not present

## 2024-11-22 DIAGNOSIS — R600 Localized edema: Secondary | ICD-10-CM | POA: Insufficient documentation

## 2024-11-22 DIAGNOSIS — L97819 Non-pressure chronic ulcer of other part of right lower leg with unspecified severity: Secondary | ICD-10-CM | POA: Insufficient documentation

## 2024-11-22 DIAGNOSIS — I739 Peripheral vascular disease, unspecified: Secondary | ICD-10-CM | POA: Diagnosis not present

## 2024-11-22 DIAGNOSIS — I5022 Chronic systolic (congestive) heart failure: Secondary | ICD-10-CM | POA: Diagnosis not present

## 2024-11-23 ENCOUNTER — Telehealth: Payer: Self-pay | Admitting: Licensed Clinical Social Worker

## 2024-11-23 ENCOUNTER — Encounter: Payer: Self-pay | Admitting: Licensed Clinical Social Worker

## 2024-11-24 NOTE — Patient Instructions (Signed)
 Eryx Drabik Jr. - I am sorry I was unable to reach you today for our scheduled appointment. I work with Georgina Speaks, FNP and am calling to support your healthcare needs. Please contact me at [phone] at your earliest convenience. I look forward to speaking with you soon.   Thank you,  Rolin Kerns, LCSW [place]  [facility], [facility] Clinical Social Worker Direct Dial: [phone]  Fax: [phone] Website: [email or web address] 9:43 AM

## 2024-11-29 ENCOUNTER — Encounter (HOSPITAL_BASED_OUTPATIENT_CLINIC_OR_DEPARTMENT_OTHER): Admitting: Internal Medicine

## 2024-12-01 ENCOUNTER — Encounter (HOSPITAL_BASED_OUTPATIENT_CLINIC_OR_DEPARTMENT_OTHER): Admitting: Internal Medicine

## 2024-12-01 DIAGNOSIS — L97819 Non-pressure chronic ulcer of other part of right lower leg with unspecified severity: Secondary | ICD-10-CM | POA: Diagnosis not present

## 2024-12-07 ENCOUNTER — Encounter

## 2024-12-07 ENCOUNTER — Encounter (HOSPITAL_BASED_OUTPATIENT_CLINIC_OR_DEPARTMENT_OTHER): Admitting: General Surgery

## 2024-12-13 ENCOUNTER — Encounter (HOSPITAL_BASED_OUTPATIENT_CLINIC_OR_DEPARTMENT_OTHER): Admitting: General Surgery

## 2024-12-31 ENCOUNTER — Telehealth: Payer: Self-pay

## 2024-12-31 NOTE — Progress Notes (Signed)
 Complex Care Management Care Guide Note  12/31/2024 Name: Lyndon Chapel. MRN: [id number] DOB: June 15, 1956  [name]. is a 69 y.o. year old male who is a primary care patient of Georgina Speaks, FNP and is actively engaged with the care management team. I reached out to Yarnell Takach Jr. by phone today to assist with re-scheduling  with the Licensed Clinical Social Worker.  Follow up plan: Unsuccessful telephone outreach attempt made. A HIPAA compliant phone message was left for the patient providing contact information and requesting a return call.  [facility]  [facility], [facility] Guide  Direct Dial: [phone]  Fax [phone]

## 2025-01-03 ENCOUNTER — Encounter (HOSPITAL_BASED_OUTPATIENT_CLINIC_OR_DEPARTMENT_OTHER): Attending: General Surgery | Admitting: General Surgery

## 2025-01-03 DIAGNOSIS — L97829 Non-pressure chronic ulcer of other part of left lower leg with unspecified severity: Secondary | ICD-10-CM | POA: Insufficient documentation

## 2025-01-03 DIAGNOSIS — E1151 Type 2 diabetes mellitus with diabetic peripheral angiopathy without gangrene: Secondary | ICD-10-CM | POA: Insufficient documentation

## 2025-01-03 DIAGNOSIS — N1832 Chronic kidney disease, stage 3b: Secondary | ICD-10-CM | POA: Insufficient documentation

## 2025-01-03 DIAGNOSIS — I5022 Chronic systolic (congestive) heart failure: Secondary | ICD-10-CM | POA: Insufficient documentation

## 2025-01-03 DIAGNOSIS — L97819 Non-pressure chronic ulcer of other part of right lower leg with unspecified severity: Secondary | ICD-10-CM | POA: Insufficient documentation

## 2025-01-03 DIAGNOSIS — F03A0 Unspecified dementia, mild, without behavioral disturbance, psychotic disturbance, mood disturbance, and anxiety: Secondary | ICD-10-CM | POA: Insufficient documentation

## 2025-01-03 DIAGNOSIS — E11622 Type 2 diabetes mellitus with other skin ulcer: Secondary | ICD-10-CM | POA: Insufficient documentation

## 2025-01-03 DIAGNOSIS — E11621 Type 2 diabetes mellitus with foot ulcer: Secondary | ICD-10-CM | POA: Insufficient documentation

## 2025-01-03 DIAGNOSIS — R600 Localized edema: Secondary | ICD-10-CM | POA: Insufficient documentation

## 2025-01-03 DIAGNOSIS — L97429 Non-pressure chronic ulcer of left heel and midfoot with unspecified severity: Secondary | ICD-10-CM | POA: Insufficient documentation

## 2025-01-03 DIAGNOSIS — F17200 Nicotine dependence, unspecified, uncomplicated: Secondary | ICD-10-CM | POA: Insufficient documentation

## 2025-01-06 NOTE — Progress Notes (Incomplete)
 "   Mild  Dementia likely due to    Connor Burns. is a very pleasant 69 y.o. RH male with a history of hypertension, hyperlipidemia, PVD, Afib on Eliquis , s/p ICD, congestive cardiomyopathy, history of stroke 2006, arthritis, CKD3, gout, peripheral neuropathy,  prediabetes and a diagnosis of mild dementia of unclear etiology per neuropsych evaluation on Jan 2024 seen today in follow up for memory loss. Patient is currently on donepezil  10 mg daily***.   Patient was last seen on 07/07/2024***. Memory is ***. MMSE today is  /30. Patient is able to participate on ADLs and continues to drive without difficulties. Mood is*** . This patient is accompanied in the office by his wife*** who supplements the history.  Previous records as well as any outside records available were reviewed prior to todays visit.   Follow up in  months Recommend good control of cardiovascular risk factors.   Continue to control mood as per PCP   Discussed the use of AI scribe software for clinical note transcription with the patient, who gave verbal consent to proceed.  History of Present Illness     Any changes in memory since last visit?  Some challenges-wife says.  He forgets conversations frequently.  Sometimes he gets frustrated with crossword puzzles.  He is not very active outside of the house. Tries to keep the brain open writing in the computer. Trying to start at the [facility].  repeats oneself?  Endorsed Disoriented when walking into a room? Denies    Leaving objects?  May misplace things but not in unusual places   Wandering behavior?  Denies.   Any personality changes since last visit?  Denies. He is on Risperdal  to control the anxiety.  Any worsening depression?:  Endorsed by his wife.   Hallucinations or paranoia?  Denies.   Seizures? denies    Any sleep changes?  Does not sleep well. Denies vivid dreams, REM behavior or sleepwalking   Sleep apnea?   Denies.   Any hygiene concerns? He  needs to be reminded  Independent of bathing and dressing?  Endorsed  Does the patient needs help with medications?  Wife is in charge   Who is in charge of the finances?  Wife is in charge     Any changes in appetite?  It is good He was hospitalized in [place]  due to acute hepatitis, as well as normocytic anemia issues no followed locally.        Patient have trouble swallowing? Denies.   Does the patient cook? No Any headaches?   denies   Any vision changes? Denies  Chronic back pain  denies   Ambulates with difficulty? Has not been very active, going back to [employer] soon.  Recent falls or head injuries? Denies.     Unilateral weakness, numbness or tingling? denies   Any tremors?  Denies    Any anosmia?  Denies   Any incontinence of urine?  Endorsed, wears Depends   Any bowel dysfunction?   Denies      Patient lives with his wife     Does the patient drive? No longer drives     Initial visit 02/26/22 The patient is seen in neurologic consultation at the request of [name], [name], MD for the evaluation of memory.  The patient is accompanied by his daughter who supplements the history. This is a 69 y.o. year old RH  male who has had memory issues for about 2 years, initially noticed by his  family.  He is good at it was not bad, but since January it got worse .  Since that time, he is repeating himself more, but attributes this as multitasking, I may become more distracted than I forget .  He denies being disoriented when walking into her room or leaving objects in unusual places.  He ambulates without difficulty, denies any falls or head injuries.  He works out at least 2 times a week.  He continues to drive, and denies getting lost.  He lives with his wife and his youngest daughter.  His mood is good, he has moments in which she is more irritable my father is a very passionate person, and he gets angry if he gets bothered .  He is not as bad as he used to be .  He denies any  history of depression or anxiety, hallucinations or paranoia.  He sleeps fairly well, denies vivid dreams or sleepwalking or REM behavior.  There are no hygiene concerns, he is independent of bathing and dressing.  Sometimes he forgets his medications.  There are no issues with his finances.  I am on top of it .  His appetite is not great according to him, he is trying to control his sugar intake to maintain a better weight.  He denies any trouble swallowing.  He does not cook.  He denies any headaches,double vision, dizziness, no new focal numbness or tingling, unilateral weakness, tremors or anosmia. No history of seizures. Denies urine incontinence, retention, constipation or diarrhea.  Denies OSA, ETOH or Tobacco. Family History remarkable for maternal grandmother and sister with some sort of dementia      Neuropsych evaluation 12/30/22  Briefly, results suggested ongoing impairment surrounding executive functioning, confrontation naming, visuospatial abilities (outside of clock drawing), and all aspects of verbal learning and memory. While receptive language also represented a normative impairment, he missed very few items across this task, suggesting that this might represent more of a normative impairment than a clinical impairment. An additional weakness was exhibited across attention/concentration while performance variability was exhibited across processing speed. The etiology of his presentation is unclear as current testing patterns are fairly nonspecific in nature. His most recent neuroimaging which I was able to review is in the form of a head CT dated to 2013. 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 Given that no more recent scans could be located, I am unable to comment on any additional events over the past 10-11 years or the severity of underlying cerebrovascular disease which could be playing an active role in his current  presentation. 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 Across these tasks, he did not benefit from repeated exposure to new information, was largely amnestic after brief delays, and performed poorly across yes/no recognition trials. This suggests concerns for rapid forgetting and a significant memory storage impairment. While this could raise concerns for the presence of a neurodegenerative illness such as Alzheimer's disease (especially given additional impairment surrounding confrontation naming), visual memory remained very strong. While this is both unexpected and encouraging, continued monitoring will be important to see if cognitive impairments remain stable or if progressive decline over the next few years occurs.       CT of the head report from outside facility 06/08/2024 (for acute confusion) Small areas of encephalomalacia and gliosis in the left parietal and occipital lobes and right frontal and occipital lobes. Background sequelae of mild chronic microvascular ischemic change  and mild global parenchymal volume loss. No acute transcortical infarction, intracranial hemorrhage, abnormal mass, or mass effect. No hydrocephalus or abnormal extra-axial fluid collections. Calcific atherosclerotic changes involving the intracranial vasculature.         07/07/2024    7:00 PM 07/02/2024    9:56 AM 07/28/2023    5:00 PM  MMSE - Mini Mental State Exam  Not completed:  Unable to complete   Orientation to time 1 3 3   Orientation to Place 3 5 5   Registration 3  3  Attention/ Calculation 5 5 4   Recall 0 0 1  Language- name 2 objects 2 2 2   Language- repeat 1  1  Language- follow 3 step command 3  3  Language- read & follow direction 1  1  Write a sentence 1  1  Copy design 0  1  Total score 20  25      03/18/2022    3:00 PM  Montreal Cognitive Assessment   Visuospatial/ Executive (0/5) 3  Naming (0/3) 3  Attention: Read list of digits (0/2) 2  Attention: Read list of  letters (0/1) 1  Attention: Serial 7 subtraction starting at 100 (0/3) 3  Language: Repeat phrase (0/2) 1  Language : Fluency (0/1) 1  Abstraction (0/2) 1  Delayed Recall (0/5) 3  Orientation (0/6) 6  Total 24  Adjusted Score (based on education) 24      Objective:    Neurological Exam:    VITALS:  There were no vitals filed for this visit.  GEN:  The patient appears stated age and is in NAD. HEENT:  Normocephalic, atraumatic.   Neurological examination:  General: NAD, well-groomed, appears stated age. Orientation: The patient is alert. Oriented to person, place and not to date Cranial nerves: There is good facial symmetry.The speech is fluent and clear. No aphasia or dysarthria. Fund of knowledge is appropriate. Recent and remote memory are impaired. Attention and concentration are reduced. Able to name objects and repeat phrases.  Hearing is intact to conversational tone. *** Sensation: Sensation is intact to light touch throughout Motor: Strength is at least antigravity x4. DTR's 2/4 in UE/LE     Movement examination:  Tone: There is normal tone in the UE/LE Abnormal movements:  no tremor.  No myoclonus.  No asterixis.   Coordination:  There is no decremation with RAM's. Normal finger to nose  Gait and Station: The patient has no*** difficulty arising out of a deep-seated chair without the use of the hands. The patient's stride length is good.  Gait is cautious and narrow.    Thank you for allowing us  the opportunity to participate in the care of this nice patient. Please do not hesitate to contact us  for any questions or concerns.   Total time spent on today's visit was *** minutes dedicated to this patient today, preparing to see patient, examining the patient, ordering tests and/or medications and counseling the patient, documenting clinical information in the EHR or other health record, independently interpreting results and communicating results to the  patient/family, discussing treatment and goals, answering patient's questions and coordinating care.  Cc:  Georgina Speaks, FNP  Camie Sevin 01/06/2025 5:20 AM      "

## 2025-01-07 ENCOUNTER — Encounter: Payer: Self-pay | Admitting: Physician Assistant

## 2025-01-07 ENCOUNTER — Ambulatory Visit: Admitting: Physician Assistant

## 2025-01-10 ENCOUNTER — Encounter (HOSPITAL_BASED_OUTPATIENT_CLINIC_OR_DEPARTMENT_OTHER): Admitting: General Surgery

## 2025-01-10 ENCOUNTER — Ambulatory Visit: Attending: Cardiology | Admitting: Cardiology

## 2025-01-10 NOTE — Progress Notes (Unsigned)
" °  Electrophysiology Office Note:   Date:  01/10/2025  ID:  Connor Burns., DOB 1956-01-14, MRN [id number]  Primary Cardiologist: [name], DO Primary Heart Failure: None Electrophysiologist: Christin Moline Gladis Norton, MD  {Click to update primary MD,subspecialty MD or APP then REFRESH:1}    History of Present Illness:   Connor Burns. is a 69 y.o. male with h/o coronary artery disease post CABG, chronic systolic heart failure due to ischemic cardiomyopathy, hyperlipidemia, longstanding persistent atrial fibrillation, morbid obesity seen today for routine electrophysiology followup.   Since last being seen in our clinic the patient reports doing ***.  he denies chest pain, palpitations, dyspnea, PND, orthopnea, nausea, vomiting, dizziness, syncope, edema, weight gain, or early satiety.   Review of systems complete and found to be negative unless listed in HPI.      EP Information / Studies Reviewed:    {EKGtoday:28818}      ICD Interrogation-  reviewed in detail today,  See PACEART report.  Device History: Medtronic Dual Chamber ICD implanted for chronic systolic heart failure History of appropriate therapy: No History of AAD therapy: No   Risk Assessment/Calculations:    CHA2DS2-VASc Score = 4  {Confirm score is correct.  If not, click here to update score.  REFRESH note.  :1} This indicates a 4.8% annual risk of stroke. The patient's score is based upon: CHF History: 1 HTN History: 1 Diabetes History: 0 Stroke History: 0 Vascular Disease History: 1 Age Score: 1 Gender Score: 0   {This patient has a significant risk of stroke if diagnosed with atrial fibrillation.  Please consider VKA or DOAC agent for anticoagulation if the bleeding risk is acceptable.   You can also use the SmartPhrase .[email or web address] for documentation.   :[id number]}         Physical Exam:   VS:  There were no vitals taken for this visit.   Wt Readings from Last 3 Encounters:  11/15/24 296 lb (134.3  kg)  09/27/24 285 lb 12.8 oz (129.6 kg)  09/01/24 276 lb 14.4 oz (125.6 kg)     GEN: Well nourished, well developed in no acute distress NECK: No JVD; No carotid bruits CARDIAC: {EPRHYTHM:28826}, no murmurs, rubs, gallops RESPIRATORY:  Clear to auscultation without rales, wheezing or rhonchi  ABDOMEN: Soft, non-tender, non-distended EXTREMITIES:  No edema; No deformity   ASSESSMENT AND PLAN:    Chronic systolic dysfunction s/p Medtronic dual chamber ICD  euvolemic today Stable on an appropriate medical regimen Normal ICD function See Pace Art report No changes today  2.  Coronary disease: Post CABG.  Plan per primary cardiology.  3.  Longstanding persistent atrial fibrillation:***  4.  Secondary hypercoagulable state: On Xarelto   Disposition:   Follow up with {EPPROVIDERS:28135::EP Team} {EPFOLLOW LE:71826}   Signed, Crislyn Willbanks Gladis Norton, MD  "

## 2025-01-12 ENCOUNTER — Telehealth (HOSPITAL_COMMUNITY): Payer: Self-pay

## 2025-01-12 NOTE — Telephone Encounter (Signed)
 Called and spoke to pt's wife [name] to confirm/remind patient of [facility] appointment [facility] on [facility].   Appointment:   [x] Confirmed  [] Left mess   [] No answer/No voice mail  [] VM Full/unable to leave message  [] Phone not in service  Patient reminded to bring all medications and/or complete list.  Confirmed patient has transportation. Gave directions, instructed to utilize valet parking.

## 2025-01-13 ENCOUNTER — Ambulatory Visit (HOSPITAL_COMMUNITY)

## 2025-01-14 ENCOUNTER — Other Ambulatory Visit: Payer: Self-pay | Admitting: Nurse Practitioner

## 2025-01-17 ENCOUNTER — Encounter (HOSPITAL_BASED_OUTPATIENT_CLINIC_OR_DEPARTMENT_OTHER): Admitting: General Surgery

## 2025-01-19 NOTE — Progress Notes (Incomplete)
 "  Advanced Heart Failure Clinic Note   Primary Care: Georgina Speaks, FNP Primary Cardiologist: Dr. Sheena Nephrology: Dr. Tobie ([facility])  HPI: 69 y.o. male with history of CAD s/p MI/CABG in 2004 in [place], HFrEF/ICM s/p ICD, longstanding persistent atrial fibrillation, hx VT, CKD, hx DVT, obesity, dementia, PAD. He previously slaw Dr. Okey in 2018, then followed with Cardiology in [place], [place]. Most recently established with Dr. Sheena in 03/24 after moving back to the area.   Had cath in 2015. Report not available. Available records suggest 2 of his vein grafts were occluded.  Echo 2019: EF 30-35%  Echo at OSH 03/24: EF 30-35%, RV function not assessed  He had a stress test in 3/24 with large area of infarction in inferior wall without reversible ischemia, LVEF 23%. Study was reviewed by Dr. Sheena. Decision made to defer LHC d/t kidney function.   He was admitted 9/24 with acute respiratory failure with hypoxia 2/2 acute on chronic CHF. Had not been compliant with medications. Echo during admit with EF 35-40%, RWMA, RV mildly reduced. Cardiology consulted. He was diuresed. GDMT limited by CKD, blood pressure too soft for BiDil. Weight on admit 294 lb >>> 225 lb at discharge.   He was seen in TOC x 2 post hospital and was instructed to continue ongoing care w/ general cardiology w/ f/u in [facility] as needed.   Follow up 2/25 with [facility] and was significantly volume overloaded, wt was up ~50 lb, in the setting of dietary indiscretion and nonadherance to home diuretic regimen. Furoscix  was considered but pt declined citing fear of needles. Torsemide  was increased and he was instructed to follow back up in the [facility].   Admitted 6/25 to OOH in [place] , with AMS. Got in his car and drove from [place] to [place]. Found by police at a gas station, and taken to hospital in [place]. GDMT stopped, given IVF. Found to have elevated LFTs, felt to be hepatic congestion, unable to tolerate MRCP. Ltd echo showed EF 41-45%.  AMS felt to be 2/2 to progressive dementia and he was started on risperidone . Cards consulted due to HF and diuresed with IV lasix , and GDMT restarted.  Admitted 9/25 with A/C HFrEF in the setting of medication non adherence. Echo showed EF 30-35%. GDMT limited by CKD. Diuresed with IV lasix . Palliative care team was consulted, family declined SNF but agreed to [facility] services.   Today he returns for AHF follow up with his wife. Overall feeling pretty good. Denies palpitations, CP, dizziness, or PND/Orthopnea. Edema at baseline, his wife helps keep his legs wrapped. SOB with activity. Appetite ok. No fever or chills. Does not weight at home as he does not have a scale. Taking all medications, his wife helps him with his medicines. Denies ETOH, tobacco or drug use. Most of the history was provided by his wife with hx dementia. Woks with PT once a week.    Past Medical History:  Diagnosis Date   Acute osteomyelitis of ankle and foot 05/17/2014   Acute systolic heart failure (HCC) 09/12/2023   Arthritis 10/27/2020   Wrist   Atherosclerosis of native arteries of the extremities with ulceration 06/15/2014   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   veral times a day for 10 minutes each time; The patient was also instructed to hold his Xarelto  tonight and tomorrow night.  He is to restart his Xarelto  on Sunday night.   CAD (coronary artery disease)    CHF (congestive heart failure) 01/14/2017  Intermittent CHF with lower extremity edema.  I have given him Lasix  to take on a p.r.n. basis.Formatting of this note might be different from the original. Last Assessment & Plan: Formatting of this note might be different from the original. Intermittent CHF with lower extremity edema.  I have given him Las   Chronic kidney disease 10/27/2020   Stage II   Congestive cardiomyopathy 01/14/2017   Cardiomyopathy with LV dysfunction.Last ejection fraction 40-45% by echocardiography in  2012.Patient currently not taking beta-blocker Ace inhibitor for some reason.We will repeat echocardiography to evaluate LV function then determine the need for afterload reduction and beta-blocker therapy.[place]   Coronary atherosclerosis 01/14/2017   Status remote CABG in 2004 in [place] .Left internal mammary artery to the obtuse marginal.Free Right internal mammary artery to the LAD.Saphenous vein graft to OM 2.Saphenous vein graft to the RCA.  Asymptomatic without chest pain or shortness of breath.Last nuclear stress test May 2018:  Large   DVT (deep venous thrombosis)    Dyslipidemia 05/07/2018   Hematuria 08/10/2015   Hypertension    ICD (implantable cardioverter-defibrillator) battery depletion 06/16/2018   Status post dual-chamber ICD generator change 06/16/2018.Last Assessment & Plan: Formatting of this note might be different from the original.Left infraclavicular incision intact with Dermabond.  There is no pocket hematoma present.  No drainage noted.Device interrogation this a.m. reveals appropriate device function.   Ischemic cardiomyopathy 06/18/2016   Echo 09/14/2011 showing LA 4.9cm, EF 40-45%.  Mild to moderate decreased LV systolic function.  Mild concentric LVH.  LA is moderately dilated.  Mild aortic cusp sclerosis. Aortic valve appears to be bicuspid.B. Concerning for potential dysrhythmia (ventricular tachycardia) especially given documented nonsustained ventricular tachy   Mild dementia, unclear etiology 12/30/2022   Myocardial infarction    Obesity (BMI 30-39.9) 08/10/2015   Onychomycosis 05/10/2014   Painful legs and moving toes of left foot 05/10/2014   Paroxysmal atrial fibrillation 08/08/2015   A. Paroxysmal.B. On Sotalol therapy.Last Assessment & Plan: History of present atrial fibrillation.  Patient will 80 mg twice daily.Also on rivaroxaban  20 mg a day.Patient again refused an ECG.I explained to the patient, that the absence of an EKG, I canno   Peripheral  vascular disease    Shortness of breath 10/27/2020   Exertional   Stroke 10/27/2020   Right frontal infarct per neuroimaging dating back at least to 2013   Syncope 01/14/2017   In the setting of ischemic cardiomyopathy. B. Concerning for potential dysrhythmia (ventricular tachycardia) especially given documented nonsustained ventricular tachycardia - EPS 9/25/201 showing reproducibly inducible nonsustained monomorphic VT up to 10 beat.  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 In the setting of ischemic cardiomyopathy. B. Concerning for potential dysrhythmia (ventricular tachycardia) especially given documented nonsustained ventricular tachycardia - EPS 9/25/201 showing reproducibly inducible nonsustained monomorphic VT up to 10 beat.  Reproducibly inducible nonsustained polymorphic VT.  Inducible VF wi   Current Outpatient Medications  Medication Sig Dispense Refill   apixaban  (ELIQUIS ) 5 MG TABS tablet Take 1 tablet by mouth twice daily 60 tablet 5   atorvastatin  (LIPITOR) 20 MG tablet Take 1 tablet (20 mg total) by mouth daily. 30 tablet 2   carvedilol  (COREG ) 3.125 MG tablet Take 1 tablet (3.125 mg total) by mouth 2 (two) times daily. 60 tablet 2   ciclopirox  (PENLAC ) 8 % solution Apply topically at bedtime. Apply over nail and surrounding  skin. Apply daily over previous coat. After seven (7) days, may remove with alcohol and continue cycle. (Patient not taking: Reported on 11/15/2024) 6.6 mL 12   cyanocobalamin  (VITAMIN B12) 1000 MCG tablet Take 1 tablet (1,000 mcg total) by mouth daily. 30 tablet 2   donepezil  (ARICEPT ) 10 MG tablet Take 1 tablet (10 mg total) by mouth daily. 90 tablet 3   empagliflozin  (JARDIANCE ) 10 MG TABS tablet Take 1 tablet (10 mg total) by mouth daily. 90 tablet 3   [Paused] Furosemide  (FUROSCIX ) 80 MG/10ML CTKT Inject 80 mg  into the skin daily as needed. (Patient not taking: Reported on 11/15/2024) 5 each 2   isosorbide  mononitrate (IMDUR ) 30 MG 24 hr tablet Take 1 tablet (30 mg total) by mouth daily. 30 tablet 0   potassium chloride  SA (KLOR-CON  M) 20 MEQ tablet Take 2 tablets (40 mEq total) by mouth 2 (two) times daily. 120 tablet 0   risperiDONE  (RISPERDAL ) 0.5 MG tablet TAKE 1 TABLET BY MOUTH AT BEDTIME 30 tablet 0   silver  sulfADIAZINE  (SILVADENE ) 1 % cream Apply 1 Application topically 2 (two) times daily. Clean site of wound and apply cream. (Patient not taking: Reported on 11/15/2024) 400 g 1   SV IRON 325 (65 Fe) MG tablet Take 325 mg by mouth 3 (three) times a week.     torsemide  (DEMADEX ) 20 MG tablet Take 4 tablets (80 mg total) by mouth 2 (two) times daily. 240 tablet 5   No current facility-administered medications for this visit.   Allergies  Allergen Reactions   Clopidogrel Anaphylaxis, Swelling, Other (See Comments) and Cough    Out of body experience    Social History   Socioeconomic History   Marital status: Married    Spouse name: [name]   Number of children: 1   Years of education: 49   Highest education level: Master's degree (e.g., MA, MS, MEng, MEd, MSW, MBA)  Occupational History   Occupation: Chaplain    Comment: [employer]    Comment: newly retired  Tobacco Use   Smoking status: Never   Smokeless tobacco: Never  Vaping Use   Vaping status: Never Used  Substance and Sexual Activity   Alcohol use: No   Drug use: No   Sexual activity: Not on file  Other Topics Concern   Not on file  Social History Narrative   Lives with wife in a 2 story home.  Has 3 children.     Works as a metallurgist.     Education: Masters in [employer].   Right handed   Caffeine yes   Social Drivers of Health   Tobacco Use: Low Risk (11/15/2024)   Patient History    Smoking Tobacco Use: Never    Smokeless Tobacco Use: Never    Passive Exposure: Not on file  Financial Resource Strain:  Medium Risk (10/13/2024)   Overall Financial Resource Strain (CARDIA)    Difficulty of Paying Living Expenses: Somewhat hard  Food Insecurity: Food Insecurity Present (10/13/2024)   Epic    Worried About Programme Researcher, Broadcasting/film/video in the Last Year: Sometimes true    Ran Out of Food in the Last Year: Sometimes true  Transportation Needs: No Transportation Needs (10/13/2024)   Epic    Lack of Transportation (Medical): No    Lack of Transportation (Non-Medical): No  Physical Activity: Inactive (10/13/2024)   Exercise Vital Sign    Days of Exercise per Week: 0 days    Minutes of Exercise per  Session: 0 min  Stress: Stress Concern Present (10/13/2024)   [employer] of Occupational Health - Occupational Stress Questionnaire    Feeling of Stress: To some extent  Social Connections: Socially Integrated (10/13/2024)   Social Connection and Isolation Panel    Frequency of Communication with Friends and Family: More than three times a week    Frequency of Social Gatherings with Friends and Family: More than three times a week    Attends Religious Services: More than 4 times per year    Active Member of Clubs or Organizations: Yes    Attends Banker Meetings: More than 4 times per year    Marital Status: Married  Catering Manager Violence: Not At Risk (10/13/2024)   Epic    Fear of Current or Ex-Partner: No    Emotionally Abused: No    Physically Abused: No    Sexually Abused: No  Depression (PHQ2-9): Low Risk (10/13/2024)   Depression (PHQ2-9)    PHQ-2 Score: 1  Alcohol Screen: Low Risk (10/13/2024)   Alcohol Screen    Last Alcohol Screening Score (AUDIT): 0  Housing: Unknown (10/13/2024)   Epic    Unable to Pay for Housing in the Last Year: No    Number of Times Moved in the Last Year: Not on file    Homeless in the Last Year: No  Utilities: Not At Risk (10/13/2024)   Epic    Threatened with loss of utilities: No  Health Literacy: Adequate Health Literacy (10/13/2024)    B1300 Health Literacy    Frequency of need for help with medical instructions: Never    Family History  Problem Relation Age of Onset   Heart disease Mother        before age 38   Hyperlipidemia Mother    Hypertension Mother    Heart attack Mother    Hyperlipidemia Father    Hypertension Father    Memory loss Brother    Memory loss Brother    Memory loss Half-Sister    Memory loss Maternal Aunt    Wt Readings from Last 3 Encounters:  11/15/24 134.3 kg (296 lb)  09/27/24 129.6 kg (285 lb 12.8 oz)  09/01/24 125.6 kg (276 lb 14.4 oz)   There were no vitals taken for this visit.  PHYSICAL EXAM: General:  chronically ill appearing.  No respiratory difficulty Neck: JVD ~8/9 cm.  Cor: Irregular rate & rhythm. No murmurs. Lungs: clear, diminished bases Extremities: +1-2 BLE edema  Neuro: alert & oriented x 3. Affect pleasant.   Device interrogation (personally reviewed): OptiVol down, 100% AF, no VT, 0.1 hr/day activity, VP 2.2%   ASSESSMENT & PLAN: Chronic Systolic Heart Failure - iCM - Hx MI s/p CABG in 2004 - s/p ICD - EF had been in the 30-35% x years - Echo 9/24 : EF 35-40%, RV mildly reduced - Echo 6/25: EF 40-45% - Lim Echo 9/25 EF 30-35% - NYHA IIIb, volume stable on exam and on Optivol.  - Continue torsemide  80 mg bid + 40 KCL bid.  - Continue Jardiance  10 mg daily.  - Continue Coreg  3.125 mg bid. - Labs reviewed from last week in [facility].   2. CAD - Hx MI and CABG (LIMA to OM, Free RIMA to LAD, SVG to OM2, SVG to RCA) in 2003. - Records report 2 vein grafts occluded on cath in 2015. - No chest pain. - No ASA with anticoagulation. - Continue statin.  3. Persistent atrial fibrillation - hx Afib  ablation - Pursuing rate control - Continue Coreg . - Continue Eliquis  5 mg bid. Denies abnormal bleeding.  - Followed by EP.  4. CKD IV - Baseline SCr 2.3-2.5. - Continue Jardiance . - Labs today - Followed by [facility].   5. Dementia - Follows with  Neurology.   - Wife is assisting with medications  6. VT - Hx ICD placement. Need f/u with EP as they keep getting messages about remote transmission not being recorded but they are unsure on how to send as they have no device at home?.  - Established with EP, will send message to their office today for f/u.   7. PAD - Previously followed by Dr Eliza with [facility], has hx of osteomyelitis - Denies claudication but generally not active - Chronic LE wound, followed by [facility]ound [facility] - ABIs showed moderate PVD in bilateral legs - Continue statin + AC  8. SDOH - Wife works & manges his meds, not much home support - Nnot Paramedicine candidate due to location. He lives in [place], verified again today.  - ? [facility] [place]  Follow up in 2 months with APP. High risk of readmission with difficulty managing meds. Wife helps as much as she can.   Harlene HERO [place], AGACNP-BC  01/19/25  "

## 2025-01-21 ENCOUNTER — Telehealth (HOSPITAL_COMMUNITY): Payer: Self-pay

## 2025-01-21 ENCOUNTER — Ambulatory Visit (HOSPITAL_COMMUNITY)

## 2025-01-24 ENCOUNTER — Encounter (HOSPITAL_BASED_OUTPATIENT_CLINIC_OR_DEPARTMENT_OTHER): Admitting: General Surgery

## 2025-01-26 ENCOUNTER — Encounter

## 2025-01-28 ENCOUNTER — Other Ambulatory Visit: Payer: Self-pay

## 2025-01-28 NOTE — Patient Instructions (Signed)
 Kiren Pianka Jr. - I am sorry I was unable to reach you today for our scheduled appointment. I work with Georgina Speaks, FNP and am calling to support your healthcare needs. Please contact me at [phone] at your earliest convenience. I look forward to speaking with you soon.   Thank you,  Olam Ally, MSW, LCSW   [facility], [facility] Licensed Clinical Social Worker Direct Dial: [phone]

## 2025-01-28 NOTE — Patient Outreach (Signed)
 LCSW called patient's spouse at scheduled time and was unable to reach her. LCSW left voicemail and sent referral back to scheduling guide to reschedule.  Olam Ally, MSW, LCSW [place]  [facility], [facility] Licensed Clinical Social Worker Direct Dial: [phone]

## 2025-01-28 NOTE — Patient Outreach (Signed)
 LCSW received a return call from patient's spouse [name]. At the time of the call, patient was not with [facility].[name] explained that she missed LCSW call. LCSW explained the reason for the referral. [name] explained that she is seeking HH and assistance with applying for Medicaid. LCSW asked about mental health concerns. [name] explained that patient is on medication however is unsure if patient would want additional treatment.  LCSW informed [name] that she would need to ask PCP to do a referral if appropriate on next PCP which is on 02/09/2025. LCSW provided [facility] with the online application for medicaid and discussed some of the income restrictions. [name] explained that she would speak with patient in regards to any more treatment options for mental health needs.  It was decided that LCSW would follow up on 02/11/2025 at 3:00 pm with [facility] and patient because this was the best time for her schedule.  Olam Ally, MSW, LCSW [place]  [facility], [facility] Licensed Clinical Social Worker Direct Dial: [phone]

## 2025-01-31 ENCOUNTER — Encounter (HOSPITAL_BASED_OUTPATIENT_CLINIC_OR_DEPARTMENT_OTHER): Admitting: General Surgery

## 2025-02-04 ENCOUNTER — Ambulatory Visit (HOSPITAL_COMMUNITY)

## 2025-02-09 ENCOUNTER — Ambulatory Visit: Payer: Self-pay | Admitting: Nurse Practitioner

## 2025-02-11 ENCOUNTER — Telehealth

## 2025-02-14 ENCOUNTER — Ambulatory Visit

## 2025-03-08 ENCOUNTER — Encounter

## 2025-04-27 ENCOUNTER — Encounter

## 2025-06-07 ENCOUNTER — Encounter

## 2025-07-27 ENCOUNTER — Encounter

## 2025-09-06 ENCOUNTER — Encounter

## 2025-10-26 ENCOUNTER — Encounter

## 2025-12-06 ENCOUNTER — Encounter

## 2026-01-25 ENCOUNTER — Encounter

## 2026-03-07 ENCOUNTER — Encounter
